# Patient Record
Sex: Male | Born: 1950 | Race: White | Hispanic: No | Marital: Married | Smoking: Former smoker
Health system: Southern US, Community
[De-identification: ages and names within clinical notes are randomized; demographics above are authoritative.]

## PROBLEM LIST (undated history)

## (undated) DIAGNOSIS — R234 Changes in skin texture: Secondary | ICD-10-CM

## (undated) DIAGNOSIS — Z87442 Personal history of urinary calculi: Secondary | ICD-10-CM

## (undated) DIAGNOSIS — H9190 Unspecified hearing loss, unspecified ear: Secondary | ICD-10-CM

## (undated) DIAGNOSIS — N179 Acute kidney failure, unspecified: Secondary | ICD-10-CM

## (undated) DIAGNOSIS — A319 Mycobacterial infection, unspecified: Secondary | ICD-10-CM

## (undated) DIAGNOSIS — Z452 Encounter for adjustment and management of vascular access device: Secondary | ICD-10-CM

## (undated) DIAGNOSIS — Z8719 Personal history of other diseases of the digestive system: Secondary | ICD-10-CM

## (undated) DIAGNOSIS — K219 Gastro-esophageal reflux disease without esophagitis: Secondary | ICD-10-CM

## (undated) DIAGNOSIS — I82409 Acute embolism and thrombosis of unspecified deep veins of unspecified lower extremity: Secondary | ICD-10-CM

## (undated) DIAGNOSIS — K5090 Crohn's disease, unspecified, without complications: Secondary | ICD-10-CM

## (undated) DIAGNOSIS — K859 Acute pancreatitis without necrosis or infection, unspecified: Secondary | ICD-10-CM

## (undated) DIAGNOSIS — M009 Pyogenic arthritis, unspecified: Secondary | ICD-10-CM

## (undated) DIAGNOSIS — J189 Pneumonia, unspecified organism: Secondary | ICD-10-CM

## (undated) DIAGNOSIS — F419 Anxiety disorder, unspecified: Secondary | ICD-10-CM

## (undated) DIAGNOSIS — M86142 Other acute osteomyelitis, left hand: Secondary | ICD-10-CM

## (undated) HISTORY — DX: Mycobacterial infection, unspecified: A31.9

## (undated) HISTORY — PX: ILEOSTOMY: SHX1783

## (undated) HISTORY — PX: CHOLECYSTECTOMY: SHX55

## (undated) HISTORY — PX: COLON SURGERY: SHX602

## (undated) HISTORY — DX: Crohn's disease, unspecified, without complications: K50.90

## (undated) HISTORY — PX: COLONOSCOPY: SHX174

## (undated) HISTORY — PX: APPENDECTOMY: SHX54

---
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
## 2002-07-04 ENCOUNTER — Ambulatory Visit (HOSPITAL_COMMUNITY): Admission: RE | Admit: 2002-07-04 | Discharge: 2002-07-04 | Payer: Self-pay | Admitting: Internal Medicine

## 2004-01-18 ENCOUNTER — Emergency Department (HOSPITAL_COMMUNITY): Admission: EM | Admit: 2004-01-18 | Discharge: 2004-01-18 | Payer: Self-pay | Admitting: Emergency Medicine

## 2004-01-20 ENCOUNTER — Emergency Department (HOSPITAL_COMMUNITY): Admission: EM | Admit: 2004-01-20 | Discharge: 2004-01-20 | Payer: Self-pay | Admitting: Emergency Medicine

## 2004-01-25 ENCOUNTER — Emergency Department (HOSPITAL_COMMUNITY): Admission: EM | Admit: 2004-01-25 | Discharge: 2004-01-25 | Payer: Self-pay | Admitting: Emergency Medicine

## 2004-05-08 ENCOUNTER — Ambulatory Visit: Payer: Self-pay | Admitting: Internal Medicine

## 2004-11-11 ENCOUNTER — Ambulatory Visit: Payer: Self-pay | Admitting: Internal Medicine

## 2005-04-21 ENCOUNTER — Ambulatory Visit: Payer: Self-pay | Admitting: Internal Medicine

## 2005-12-02 ENCOUNTER — Ambulatory Visit: Payer: Self-pay | Admitting: Internal Medicine

## 2006-06-17 ENCOUNTER — Ambulatory Visit: Payer: Self-pay | Admitting: Internal Medicine

## 2006-06-23 ENCOUNTER — Emergency Department (HOSPITAL_COMMUNITY): Admission: EM | Admit: 2006-06-23 | Discharge: 2006-06-23 | Payer: Self-pay | Admitting: Emergency Medicine

## 2006-06-29 ENCOUNTER — Ambulatory Visit (HOSPITAL_COMMUNITY): Admission: RE | Admit: 2006-06-29 | Discharge: 2006-06-30 | Payer: Self-pay | Admitting: Orthopaedic Surgery

## 2006-06-29 HISTORY — PX: ORIF DISTAL RADIUS FRACTURE: SUR927

## 2010-05-29 ENCOUNTER — Ambulatory Visit
Admission: RE | Admit: 2010-05-29 | Discharge: 2010-05-29 | Payer: Self-pay | Source: Home / Self Care | Attending: Internal Medicine | Admitting: Internal Medicine

## 2010-07-02 ENCOUNTER — Encounter (HOSPITAL_BASED_OUTPATIENT_CLINIC_OR_DEPARTMENT_OTHER): Payer: PRIVATE HEALTH INSURANCE | Admitting: Internal Medicine

## 2010-07-02 ENCOUNTER — Ambulatory Visit (HOSPITAL_COMMUNITY)
Admission: RE | Admit: 2010-07-02 | Discharge: 2010-07-02 | Disposition: A | Discharge status: Home / Self Care | Payer: PRIVATE HEALTH INSURANCE | Source: Ambulatory Visit | Attending: Internal Medicine | Admitting: Internal Medicine

## 2010-07-02 DIAGNOSIS — Z8719 Personal history of other diseases of the digestive system: Secondary | ICD-10-CM

## 2010-07-02 DIAGNOSIS — K5080 Crohn's disease of both small and large intestine without complications: Secondary | ICD-10-CM

## 2010-07-02 DIAGNOSIS — R109 Unspecified abdominal pain: Secondary | ICD-10-CM | POA: Insufficient documentation

## 2010-07-02 DIAGNOSIS — R112 Nausea with vomiting, unspecified: Secondary | ICD-10-CM | POA: Insufficient documentation

## 2010-07-02 DIAGNOSIS — K5289 Other specified noninfective gastroenteritis and colitis: Secondary | ICD-10-CM | POA: Insufficient documentation

## 2010-07-02 HISTORY — PX: ILEOSCOPY: SHX311

## 2010-07-15 NOTE — Op Note (Signed)
  NAMEKRISHAWN, Klein                ACCOUNT NO.:  [id number]  MEDICAL RECORD NO.:  [id number]           PATIENT TYPE:  O  LOCATION:  DAYP                          FACILITY:  [facility]  PHYSICIAN:  Lionel December, M.D.    DATE OF BIRTH:  1950-11-14  DATE OF PROCEDURE:  07/02/2010 DATE OF DISCHARGE:                              OPERATIVE REPORT   PROCEDURE:  Ileoscopy.  INDICATIONS:  Abraham is 60 year old Caucasian male with very complicated history of Crohn disease of about 35 years' duration with multiple surgeries.  Lately, he has experienced episodes of abdominal pain with nausea, vomiting, and shutting down of his ileostomy.  He was in emergency room few weeks ago and his symptoms resolved while he was waiting.  He had another episode over the weekend.  He and his wife are concerned that he may have ileal stricture.  He is therefore undergoing this exam.  He did have a small-bowel follow-through few weeks ago and was within normal limits.  The patient's last surgery was about 2 years ago when he had resection of ileocolonic segment with extensive disease. He now has ileostomy.  He is not having any rectal bleeding or drainage. Procedures were reviewed with the patient and informed consent was obtained.  MEDICATIONS FOR CONSCIOUS SEDATION:  Fentanyl 25 mcg IV, Versed 4 mg IV.  FINDINGS:  Procedure performed with the patient in supine position. Mucosa at ileostomy was somewhat erythematous but no erosions or ulcers were noted.  There was a large ventral hernia lateral to the ileostomy. Skin was somewhat indurated below the ileostomy, but no definite mass palpated.  Digital exam revealed no stricture but there was a firmness along the left or medial wall.  This was felt to be extrinsic and possibly scar.  I was able to pass finger distal to it.  Pediatric colonoscope was used.  The scope was placed into ileostomy and advanced for about 30 cm.  In the distal 10 cm or so he had mucosal  edema, erythema, and few erosions.  One erosion was semilunar shaped involving half the circumference; however, no stricture was noted in the segments that were examined.  Mucosa beyond 10 cm was normal.  Endoscope was withdrawn.  No biopsy was taken.  The patient tolerated the procedure well.  FINAL DIAGNOSES: 1. 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 2. Firmness noted on digital exam along the medial wall, felt to be     extrinsic.  RECOMMENDATIONS:  He will continue 6-MP as before and stay on prednisone 5 mg daily.  Should his symptoms recur, he will give me a call; otherwise, return for OV in 3 months.     Lionel December, M.D.     NR/MEDQ  D:  07/02/2010  T:  07/02/2010  Job:  [id number]  cc:   Fara Chute Fax: [phone]  Electronically Signed by Lionel December M.D. on 07/15/2010 10:20:36 AM

## 2010-10-03 NOTE — Op Note (Signed)
Lee Klein, Lee Klein                          ACCOUNT NO.:  [id number]   MEDICAL RECORD NO.:  [id number]                   PATIENT TYPE:  AMB   LOCATION:  DAY                                  FACILITY:  [facility]   PHYSICIAN:  Lionel December, M.D.                 DATE OF BIRTH:  1950/10/04   DATE OF PROCEDURE:  07/04/2002  DATE OF DISCHARGE:                                 OPERATIVE REPORT   PROCEDURE:  Flexible sigmoidoscopy.   INDICATIONS:  The patient is a 60 year old Caucasian male with Crohn's  disease of seven years' duration.  He is steroid-dependent.  He has been  maintained on 6-MP for several months.  He has known ulceration at ileocolic  anastomosis.  He is agreeable to coming off the prednisone if this ulcer has  healed.  Previous attempts have caused relapse of his symptoms.  The  procedure is reviewed with the patient and informed consent was obtained.   PREMEDICATION:  None.   INSTRUMENT USED:  Olympus video system.   FINDINGS:  Procedure performed in endoscopy suite.  The patient's vital  signs and O2 saturation were monitored during the procedure and remained  stable.  The patient was placed in the left lateral recumbent position and  rectal examination performed.  Some discomfort noted, but there was no  stricture.  The scope was placed in the rectum, and he had semiformed stool.  After washing, I was able to find the lumen.  The scope was passed in the  distal sigmoid colon, where anastomosis was identified.  There was mucosal  edema and erosions distal to the anastomosis.  There was circumferential  ulceration evident at the anastomosis.  Lumen was small, 6-7 mm.  I did not  attempt to pass the scope through it.  Pictures taken for the record.  Biopsies were not taken this time, as he has had them before (at [facility]).  The  endoscope was withdrawn.  The patient tolerated the procedure well.   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  Endoscopically the stricture appears to have gotten worse since his last  flexible sigmoidoscopy.   RECOMMENDATIONS:  1. For now will continue his prednisone and Imuran.  2.     I would like for him and his wife, Elita Quick, to do their own search on Remicade.      I am inclined to give him a few doses of Remicade hoping to heal this     inflammatory process.  I feel at some point he will need to have this     stricture balloon-dilated, but I would wait until there is a change in     his symptoms.  Lionel December, M.D.    NR/MEDQ  D:  07/04/2002  T:  07/04/2002  Job:  [id number]   cc:   Fara Chute  [address] [place]  [place] [address]  Fax: [phone]

## 2010-10-03 NOTE — Op Note (Signed)
Lee Klein, Lee Klein                ACCOUNT NO.:  [id number]   MEDICAL RECORD NO.:  [id number]          PATIENT TYPE:  OIB   LOCATION:  5019                         FACILITY:  [facility]   PHYSICIAN:  Vanita Panda. Magnus Ivan, M.D.DATE OF BIRTH:  1951-02-12   DATE OF PROCEDURE:  06/29/2006  DATE OF DISCHARGE:                               OPERATIVE REPORT   PREOPERATIVE DIAGNOSIS:  Right unstable intra-articular four part distal  radius fracture.   POSTOPERATIVE DIAGNOSIS:  Right unstable intra-articular four part  distal radius fracture.   PROCEDURE:  Open reduction and internal fixation of right intra-  articular unstable distal radius fracture.   IMPLANTS:  [employer] distal radial volar locking plate.   SURGEON:  Vanita Panda. Magnus Ivan, M.D.   ANESTHESIA:  General.   ANTIBIOTICS:  1 gram IV Ancef.   COMPLICATIONS:  None.   BLOOD LOSS:  Minimal.   TOURNIQUET TIME:  1 hour and 30 minutes.   INDICATIONS:  Briefly, Lee Klein is a 60 year old right-hand-dominant  male who fell off the back of some type of truck last week sustaining an  impacted distal radius fracture.  This was found to be a four-part  fracture with an intra-articular component.  Due to the unstable nature  of this fracture and this being his dominant extremity, it was  recommended he undergo open reduction and internal fixation with volar  plating to give him the potential of getting his wrist of moving  quicker, as well.  The risks and benefits of this were explained and  well understood.  He agreed to proceed with surgery.   PROCEDURE DESCRIPTION:  After informed consent was obtained and the  appropriate right wrist was marked, he was brought to the operating room  and placed supine on the operating table.  His right arm was placed on a  radiolucent arm table.  General anesthesia was then obtained.  His arm  was prepped and draped with Hibiclens scrub.  A nonsterile tourniquet  was placed on his  upper right arm.  His arm was then prepped and draped  with, again, Hibiclens scrub and sterile drapes.  An Esmarch was used to  wrap out the right wrist and the tourniquet was elevated to 250 mL  pressure. Prior to making an incision, a time-out was called and he was  identified as the correct patient and the correct extremity.  I then  took a volar approach to the wrist sharply with a knife and went between  interval of the flexor carpi radialis and radial artery, both of these  were retracted gently out of the way to expose the pronator quadratus.  The pronator quadratus was then taken down as a sleeve from radial to  ulnar direction exposing the entire volar surface of the distal radius.  There was found to be at least four significant pieces including the  large radial styloid piece and a piece affecting the lunate facet.  I  teased these into two reduced positions under direct fluoroscopy. I then  was able to pick a [employer] distal radial volar locking plate in  the standard width and place this along the volar surface of the distal  radius. With the fractures held reduced, I then placed a bicortical  screw in the proximal slot to tease the fracture in a reduced position  more and it also appeared that I had to use reduction forceps to bring  the styloid piece in.  I then placed a distal row of screws including  two screws in the radial styloid and a proximal row of screws.  All the  distal screws were fully-threaded locking screws and, again, under  direct fluoroscopy, I did see that I the fracture teased into a reduced  position.  I then further secured the plate proximally with two  additional bicortical screws.  After the fracture was reduced and  stabilized with the plate, I put the wrist through a range of motion  under direct fluoroscopy and this showed a full range of motion.  I did  not see any widening between the scaphoid and the lunate and these  appeared to  palmar flex and dorsiflex in a congruent manner.  The wound  was then copiously irrigated.  I reapproximated the pronator quadratus  as best I could over the plate with interrupted 3-0 Vicryl suture.  I  then closed the wound with additional interrupted 3-0 Vicryl suture in  the subcutaneous tissue and interrupted 3-0 Prolene on the skin.  Xeroform followed by a well padded sterile dressing and a volar plaster  splint was then applied.  The tourniquet was let down and the fingers  did pinken nicely.  The patient was awakened, extubated, and taken to  the recovery room in stable condition.  All final counts were correct.  There were no complications noted.           ______________________________  Vanita Panda. Magnus Ivan, M.D.     CYB/MEDQ  D:  06/29/2006  T:  06/29/2006  Job:  [id number]

## 2010-11-17 ENCOUNTER — Ambulatory Visit (INDEPENDENT_AMBULATORY_CARE_PROVIDER_SITE_OTHER): Payer: PRIVATE HEALTH INSURANCE | Admitting: Internal Medicine

## 2010-11-17 DIAGNOSIS — K91858 Other complications of intestinal pouch: Secondary | ICD-10-CM

## 2010-11-17 DIAGNOSIS — K5090 Crohn's disease, unspecified, without complications: Secondary | ICD-10-CM

## 2011-02-14 ENCOUNTER — Other Ambulatory Visit (INDEPENDENT_AMBULATORY_CARE_PROVIDER_SITE_OTHER): Payer: Self-pay | Admitting: Internal Medicine

## 2011-03-23 ENCOUNTER — Other Ambulatory Visit (INDEPENDENT_AMBULATORY_CARE_PROVIDER_SITE_OTHER): Payer: Self-pay | Admitting: Internal Medicine

## 2011-05-14 ENCOUNTER — Other Ambulatory Visit (INDEPENDENT_AMBULATORY_CARE_PROVIDER_SITE_OTHER): Payer: Self-pay | Admitting: Internal Medicine

## 2011-06-01 ENCOUNTER — Encounter (INDEPENDENT_AMBULATORY_CARE_PROVIDER_SITE_OTHER): Payer: Self-pay | Admitting: Internal Medicine

## 2011-06-01 ENCOUNTER — Ambulatory Visit (INDEPENDENT_AMBULATORY_CARE_PROVIDER_SITE_OTHER): Payer: PRIVATE HEALTH INSURANCE | Admitting: Internal Medicine

## 2011-06-01 VITALS — BP 102/70 | HR 74 | Temp 97.9°F | Resp 16 | Ht 72.0 in | Wt 168.4 lb

## 2011-06-01 DIAGNOSIS — K5090 Crohn's disease, unspecified, without complications: Secondary | ICD-10-CM | POA: Insufficient documentation

## 2011-06-01 DIAGNOSIS — F439 Reaction to severe stress, unspecified: Secondary | ICD-10-CM

## 2011-06-01 DIAGNOSIS — Z733 Stress, not elsewhere classified: Secondary | ICD-10-CM

## 2011-06-01 MED ORDER — ALPRAZOLAM 0.5 MG PO TABS: 0.5000 mg | ORAL_TABLET | Freq: Every evening | ORAL | Status: DC | PRN

## 2011-06-01 MED ORDER — ALPRAZOLAM 0.5 MG PO TABS: 0.5000 mg | ORAL_TABLET | ORAL | Status: DC

## 2011-06-01 MED ORDER — AZATHIOPRINE 50 MG PO TABS: 50.0000 mg | ORAL_TABLET | Freq: Three times a day (TID) | ORAL | Status: DC

## 2011-06-01 NOTE — Progress Notes (Signed)
Presenting complaint; Followup for ileal Crohn's disease. Subjective: Lee Klein is 61 year old Caucasian male who is here for scheduled visit. 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 He was evaluated at [facility] and they recommended against reconnecting him. He had flexible sigmoidoscopy in Fabry 2012 and he had active disease involving distal 10 cm of ileum. He continues to have high output via ileostomy. He states loperamide has helped. When it gets really bad he increased his prednisone dose from 5-10 mg for a few days and it seemed to make a difference. He denies melena or bleeding into colostomy. Occasionally he may have urged have a BM but nothing comes out to his rectum. He denies abdominal pain. He has good appetite. He is maintaining his weight. He states he has to get up at least once at night to empty his bag. Several weeks ago he was given an antibiotic by his primary care physician and it made his diarrhea worse; now he is back to its baseline Current Medications: Current Outpatient Prescriptions  Medication Sig Dispense Refill  . ALPRAZolam (XANAX) 0.5 MG tablet Take 1 tablet (0.5 mg total) by mouth at bedtime as needed for sleep.  90 tablet  1  . Ascorbic Acid (VITAMIN C) 1000 MG tablet Take 1,000 mg by mouth daily.      Marland Kitchen azaTHIOprine (IMURAN) 50 MG tablet Take 1 tablet (50 mg total) by mouth 3 (three) times daily.  270 tablet  3  . B Complex-C (SUPER B COMPLEX PO) Take by mouth daily.      . Calcium Carbonate-Vitamin D (CALCIUM 600 + D PO) Take by mouth 2 (two) times daily.      Marland Kitchen HYDROcodone-acetaminophen (VICODIN) 5-500 MG per tablet Take 1 tablet by mouth Nightly.      . loperamide (ANTI-DIARRHEAL) 2 MG capsule Take 2 mg by mouth. Patient takes 4 in the morning and 4 at night      . Multiple Vitamins-Minerals (MULTIVITAMIN PO) Take by mouth daily.       . predniSONE (DELTASONE) 5 MG tablet take as directed  100 tablet  0  . Zinc 50 MG CAPS Take by mouth daily.        Objective: Blood pressure 102/70, pulse 74, temperature 97.9 F (36.6 C), temperature source Oral, resp. rate 16, height 6' (1.829 m), weight 168 lb 6.4 oz (76.386 kg).  Conjunctiva is pink. Sclera is nonicteric Oral pharyngeal mucosa is normal. No neck masses or thyromegaly noted. Cardiac exam with regular rhythm normal S1 and S2. No murmur or gallop noted. Lungs are clear to auscultation. Abdomen; he has some yellowish-brown pasty stool in the ileostomy bag. No tenderness noted at ileostomy site. The rest of the abdomen is soft and nontender. He does not have organomegaly or masses.  No LE edema or clubbing noted.  Assessment: Ileal Crohn's disease; his major problem now appears to be high output diarrhea via ileostomy. He is on maximal dose of loperamide. He does not appear to be dehydrated and is maintaining his weight. Need to check his electrolytes and he is also long overdue for CBC given that he is on azathioprine. His quality of life appears to be great.   Plan: New prescription given for azathioprine and alprazolam. 3 month supply with 3 refills. He will continue low-dose prednisone as before He will go to the lab for CBC comprehensive chemistry panel and  CRP. We will contact patient with results of blood work. Office visit in 6 months.

## 2011-06-01 NOTE — Patient Instructions (Signed)
Physician will call you with result of blood work when drawn and completed.

## 2011-07-02 ENCOUNTER — Telehealth (INDEPENDENT_AMBULATORY_CARE_PROVIDER_SITE_OTHER): Payer: Self-pay | Admitting: *Deleted

## 2011-07-02 DIAGNOSIS — K5010 Crohn's disease of large intestine without complications: Secondary | ICD-10-CM

## 2011-07-02 NOTE — Telephone Encounter (Signed)
Per NUR the patient will need a CBC/D in 4 months the patient will get this in or at [facility].

## 2011-07-26 ENCOUNTER — Other Ambulatory Visit (INDEPENDENT_AMBULATORY_CARE_PROVIDER_SITE_OTHER): Payer: Self-pay | Admitting: Internal Medicine

## 2011-09-02 ENCOUNTER — Other Ambulatory Visit (INDEPENDENT_AMBULATORY_CARE_PROVIDER_SITE_OTHER): Payer: Self-pay | Admitting: Internal Medicine

## 2011-09-09 ENCOUNTER — Other Ambulatory Visit (INDEPENDENT_AMBULATORY_CARE_PROVIDER_SITE_OTHER): Payer: Self-pay | Admitting: Internal Medicine

## 2011-09-11 ENCOUNTER — Other Ambulatory Visit (INDEPENDENT_AMBULATORY_CARE_PROVIDER_SITE_OTHER): Payer: Self-pay | Admitting: Internal Medicine

## 2011-09-11 NOTE — Telephone Encounter (Signed)
Rx has been written for this

## 2011-10-16 ENCOUNTER — Other Ambulatory Visit (INDEPENDENT_AMBULATORY_CARE_PROVIDER_SITE_OTHER): Payer: Self-pay | Admitting: *Deleted

## 2011-10-16 ENCOUNTER — Encounter (INDEPENDENT_AMBULATORY_CARE_PROVIDER_SITE_OTHER): Payer: Self-pay | Admitting: *Deleted

## 2011-10-16 DIAGNOSIS — K5010 Crohn's disease of large intestine without complications: Secondary | ICD-10-CM

## 2011-10-29 ENCOUNTER — Telehealth (INDEPENDENT_AMBULATORY_CARE_PROVIDER_SITE_OTHER): Payer: Self-pay | Admitting: *Deleted

## 2011-10-29 DIAGNOSIS — K5090 Crohn's disease, unspecified, without complications: Secondary | ICD-10-CM

## 2011-10-29 NOTE — Telephone Encounter (Signed)
Patient was called at his home number, message left that lab results (CBC/D) were normal.  The next lab is due in 3 months. Patient also reminder that he has a office appointment December 15 2011 at 3:30 pm.

## 2011-11-06 ENCOUNTER — Encounter (INDEPENDENT_AMBULATORY_CARE_PROVIDER_SITE_OTHER): Payer: Self-pay

## 2011-11-07 ENCOUNTER — Other Ambulatory Visit (INDEPENDENT_AMBULATORY_CARE_PROVIDER_SITE_OTHER): Payer: Self-pay | Admitting: Internal Medicine

## 2011-12-15 ENCOUNTER — Encounter (INDEPENDENT_AMBULATORY_CARE_PROVIDER_SITE_OTHER): Payer: Self-pay | Admitting: Internal Medicine

## 2011-12-15 ENCOUNTER — Ambulatory Visit (INDEPENDENT_AMBULATORY_CARE_PROVIDER_SITE_OTHER): Payer: PRIVATE HEALTH INSURANCE | Admitting: Internal Medicine

## 2011-12-15 VITALS — BP 118/70 | HR 76 | Temp 97.4°F | Resp 20 | Ht 72.0 in | Wt 168.9 lb

## 2011-12-15 DIAGNOSIS — R197 Diarrhea, unspecified: Secondary | ICD-10-CM | POA: Insufficient documentation

## 2011-12-15 DIAGNOSIS — F430 Acute stress reaction: Secondary | ICD-10-CM

## 2011-12-15 DIAGNOSIS — F439 Reaction to severe stress, unspecified: Secondary | ICD-10-CM

## 2011-12-15 DIAGNOSIS — K9413 Enterostomy malfunction: Secondary | ICD-10-CM | POA: Insufficient documentation

## 2011-12-15 DIAGNOSIS — K9403 Colostomy malfunction: Secondary | ICD-10-CM

## 2011-12-15 DIAGNOSIS — R109 Unspecified abdominal pain: Secondary | ICD-10-CM

## 2011-12-15 MED ORDER — CHOLESTYRAMINE 4 G PO PACK: 1.0000 | PACK | Freq: Two times a day (BID) | ORAL | Status: DC

## 2011-12-15 MED ORDER — DIPHENOXYLATE-ATROPINE 2.5-0.025 MG PO TABS: 2.0000 | ORAL_TABLET | Freq: Four times a day (QID) | ORAL | Status: AC

## 2011-12-15 MED ORDER — HYDROCODONE-ACETAMINOPHEN 5-500 MG PO TABS: 1.0000 | ORAL_TABLET | Freq: Two times a day (BID) | ORAL | Status: DC | PRN

## 2011-12-15 MED ORDER — ALPRAZOLAM 0.5 MG PO TABS: 0.5000 mg | ORAL_TABLET | Freq: Every evening | ORAL | Status: DC | PRN

## 2011-12-15 NOTE — Progress Notes (Signed)
Presenting complaint;  Followup for Crohn's disease. Having problems with the ileostomy.  Subjective:  Lee Klein is 61 year old Caucasian male who is here for scheduled visit accompanied by his wife Elita Quick. He was last seen in January 2013. He is complaining of excruciating burning at inferior aspect of his ileostomy. He feels as if he has an open wound and somebody is putting alcohol over it. He was seen by physician at [facility] and was advised to use silver nitrate he was not given prescription. Patient is afraid to use this medication. He remains with diarrhea. Volume depends on what he eats. He is emptying his back 7-8 times a day and generally able to rest at night. He has not passed any blood or tarry stool into ileostomy. He is not having rectal discharge or bleeding. His appetite is up and down but he has not lost any weight since his last visit. He denies nausea vomiting fever or chills.  Current Medications: Current Outpatient Prescriptions  Medication Sig Dispense Refill  . ALPRAZolam (XANAX) 0.5 MG tablet Take 1 tablet (0.5 mg total) by mouth at bedtime as needed for sleep.  90 tablet  1  . Ascorbic Acid (VITAMIN C) 1000 MG tablet Take 1,000 mg by mouth daily.      Marland Kitchen azaTHIOprine (IMURAN) 50 MG tablet Take 1 tablet (50 mg total) by mouth 3 (three) times daily.  270 tablet  3  . B Complex-C (SUPER B COMPLEX PO) Take by mouth daily.      . Calcium Carbonate-Vitamin D (CALCIUM 600 + D PO) Take by mouth 2 (two) times daily.      Marland Kitchen HYDROcodone-acetaminophen (VICODIN) 5-500 MG per tablet take 1 tablet by mouth once daily if needed for pain  60 tablet  1  . loperamide (ANTI-DIARRHEAL) 2 MG capsule Take 2 mg by mouth. Patient takes 4 in the morning and 4 at night      . Multiple Vitamins-Minerals (MULTIVITAMIN PO) Take by mouth daily.      . predniSONE (DELTASONE) 5 MG tablet TAKE AS DIRECTED  100 tablet  1  . Zinc 50 MG CAPS Take by mouth daily.         Objective: Blood pressure  118/70, pulse 76, temperature 97.4 F (36.3 C), temperature source Oral, resp. rate 20, height 6' (1.829 m), weight 168 lb 14.4 oz (76.613 kg). Patient appears quite disgusted but in no acute distress. Conjunctiva is pink. Sclera is nonicteric Oropharyngeal mucosa is normal. No neck masses or thyromegaly noted. Cardiac exam with regular rhythm normal S1 and S2. No murmur or gallop noted. Lungs are clear to auscultation. Abdomen. An ostomy bag as greenish yellow liquid stool. Abdomen is soft and nontender. Examination of ileostomy sites reveals pale glassy appearance to a stomal skin from 3 to 9 o'clock and it is very tender. Mucosa at ileostomy site appears to be normal. No LE edema or clubbing noted.  Labs/studies Results: Lab data from 10/26/2011. WBC 7.2. H&H 13.3 and 39.5. Platelet count 165K.   Assessment:  #1. Peri-stomal pain at ileostomy site not responding to topical steroid therapy. I do not see any granulation tissue I am not sure that silver nitrate application would help. Ostomy nurse has worked with him in the past they have tried all sorts of fixed but nothing has worked. At this point would be reasonable to get an opinion from Dr. Cleotis Nipper who did his last surgery and performed our resection with ileostomy. #2. Chronic diarrhea. He has tried octreotide  in the past but it did not work. Will try him on Lomotil. #3. Insomnia. Alprazolam is working.   Plan:  Discontinue Imodium. Lomotil 1 tablet by mouth 4 times a day or 2 twice a day. Cholestyramine 4 g by mouth twice a day as directed. Will arrange for office visit with Dr. Elmyra Ricks of shot at [facility]. Progress report in one month regarding diarrhea. New prescription for alprazolam and hydrocodone/acetaminophen refilled. Office visit in 6 months. Next CBC will be due in September 2013.

## 2011-12-15 NOTE — Patient Instructions (Addendum)
Your next CBC will be in 3 months. Office visit with Dr. Cleotis Nipper to be arranged. Discontinue Imodium. Lomotil 1 tablet by mouth 4 times a day or 2 tablets before breakfast and before lunch. Take cholestyramine 2 hours before or after taking other medication. Call with progress report in one month re diarrhea

## 2011-12-16 ENCOUNTER — Telehealth (INDEPENDENT_AMBULATORY_CARE_PROVIDER_SITE_OTHER): Payer: Self-pay | Admitting: *Deleted

## 2011-12-16 DIAGNOSIS — K5010 Crohn's disease of large intestine without complications: Secondary | ICD-10-CM

## 2011-12-16 DIAGNOSIS — R197 Diarrhea, unspecified: Secondary | ICD-10-CM

## 2011-12-16 NOTE — Telephone Encounter (Signed)
Per Dr.Rehman at the time of the patient's office visit 12/15/11. The patient will need to get have lab work in September. This is noted for 02-15-12 at [name]

## 2011-12-17 ENCOUNTER — Telehealth (INDEPENDENT_AMBULATORY_CARE_PROVIDER_SITE_OTHER): Payer: Self-pay | Admitting: *Deleted

## 2011-12-17 NOTE — Telephone Encounter (Signed)
Lee Klein has started taking Lomotil , 2 four times a day. Today he took his medication and he is very dizzy, and has a headache. He ask if he should continue this medication? I told him Dr.Rehman would be made aware , that he may adjust this medication or he may have him stop the medication. Dr. Karilyn Cota was paged @ 12:02 pm , and also this was sent to him. [name] may be reached at [phone]

## 2011-12-17 NOTE — Telephone Encounter (Signed)
Please call [phone], some of his meds are making him not feel good

## 2011-12-17 NOTE — Telephone Encounter (Signed)
Already addressed

## 2011-12-17 NOTE — Telephone Encounter (Signed)
Per Dr.Rehman the patient may stop the medication and go back to the Imodium. Patient was called and made aware.

## 2011-12-18 ENCOUNTER — Telehealth (INDEPENDENT_AMBULATORY_CARE_PROVIDER_SITE_OTHER): Payer: Self-pay | Admitting: *Deleted

## 2011-12-18 NOTE — Telephone Encounter (Signed)
Dr.Rehman - Jasn called and states that he was taking the Lomotil as it was written on the medicine bottle. It said for him to take 2 (two) po  4 (four) times a day , so he was taking 8. He says that it is working and ask if he could take 2 by mouth twice a day? He may be reached at [phone]

## 2011-12-19 NOTE — Telephone Encounter (Signed)
Patient called and message left on his answering service. He can continue Lomotil if it is working and he has no side effects. 2 before breakfast and 2 before lunch. Will add third dose if necessary at a later date

## 2012-01-14 ENCOUNTER — Other Ambulatory Visit (INDEPENDENT_AMBULATORY_CARE_PROVIDER_SITE_OTHER): Payer: Self-pay | Admitting: *Deleted

## 2012-01-14 ENCOUNTER — Encounter (INDEPENDENT_AMBULATORY_CARE_PROVIDER_SITE_OTHER): Payer: Self-pay | Admitting: *Deleted

## 2012-01-14 DIAGNOSIS — K5090 Crohn's disease, unspecified, without complications: Secondary | ICD-10-CM

## 2012-01-14 DIAGNOSIS — R197 Diarrhea, unspecified: Secondary | ICD-10-CM

## 2012-01-14 DIAGNOSIS — K5010 Crohn's disease of large intestine without complications: Secondary | ICD-10-CM

## 2012-02-19 ENCOUNTER — Telehealth (INDEPENDENT_AMBULATORY_CARE_PROVIDER_SITE_OTHER): Payer: Self-pay | Admitting: *Deleted

## 2012-02-19 DIAGNOSIS — K5090 Crohn's disease, unspecified, without complications: Secondary | ICD-10-CM

## 2012-02-19 NOTE — Telephone Encounter (Signed)
Per Dr.rehman after reviewing patient's lab work 01/21/2012. The patient will need another CBC 3 months. This is noted for January 29,2014. The patient will prefer [facility] lab

## 2012-04-04 ENCOUNTER — Other Ambulatory Visit (INDEPENDENT_AMBULATORY_CARE_PROVIDER_SITE_OTHER): Payer: Self-pay | Admitting: Internal Medicine

## 2012-04-05 NOTE — Telephone Encounter (Signed)
Rx for refilled with 1 refill

## 2012-04-28 ENCOUNTER — Encounter (INDEPENDENT_AMBULATORY_CARE_PROVIDER_SITE_OTHER): Payer: Self-pay | Admitting: *Deleted

## 2012-04-28 ENCOUNTER — Telehealth (INDEPENDENT_AMBULATORY_CARE_PROVIDER_SITE_OTHER): Payer: Self-pay | Admitting: *Deleted

## 2012-04-28 DIAGNOSIS — K5090 Crohn's disease, unspecified, without complications: Secondary | ICD-10-CM

## 2012-04-28 NOTE — Telephone Encounter (Signed)
Lab order done 

## 2012-05-01 ENCOUNTER — Other Ambulatory Visit (INDEPENDENT_AMBULATORY_CARE_PROVIDER_SITE_OTHER): Payer: Self-pay | Admitting: Internal Medicine

## 2012-05-13 ENCOUNTER — Encounter (INDEPENDENT_AMBULATORY_CARE_PROVIDER_SITE_OTHER): Payer: Self-pay

## 2012-05-13 ENCOUNTER — Encounter (INDEPENDENT_AMBULATORY_CARE_PROVIDER_SITE_OTHER): Payer: Self-pay | Admitting: *Deleted

## 2012-06-08 ENCOUNTER — Telehealth (INDEPENDENT_AMBULATORY_CARE_PROVIDER_SITE_OTHER): Payer: Self-pay | Admitting: *Deleted

## 2012-06-08 DIAGNOSIS — K5090 Crohn's disease, unspecified, without complications: Secondary | ICD-10-CM

## 2012-06-08 NOTE — Telephone Encounter (Signed)
Per Dr.Rehman the patient will need to have CBC in 3 months, this has been noted . Patient request that the order be sent to [facility]

## 2012-06-16 ENCOUNTER — Encounter (INDEPENDENT_AMBULATORY_CARE_PROVIDER_SITE_OTHER): Payer: Self-pay | Admitting: Internal Medicine

## 2012-06-16 ENCOUNTER — Ambulatory Visit (INDEPENDENT_AMBULATORY_CARE_PROVIDER_SITE_OTHER): Payer: Medicare HMO | Admitting: Internal Medicine

## 2012-06-16 VITALS — BP 122/68 | HR 80 | Temp 98.1°F | Ht 72.0 in | Wt 170.8 lb

## 2012-06-16 DIAGNOSIS — F439 Reaction to severe stress, unspecified: Secondary | ICD-10-CM

## 2012-06-16 DIAGNOSIS — Z733 Stress, not elsewhere classified: Secondary | ICD-10-CM

## 2012-06-16 DIAGNOSIS — K5090 Crohn's disease, unspecified, without complications: Secondary | ICD-10-CM

## 2012-06-16 MED ORDER — HYDROCODONE-ACETAMINOPHEN 5-500 MG PO TABS: 1.0000 | ORAL_TABLET | Freq: Three times a day (TID) | ORAL | Status: DC | PRN

## 2012-06-16 MED ORDER — PREDNISONE 5 MG PO TABS: 5.0000 mg | ORAL_TABLET | Freq: Every day | ORAL | Status: DC

## 2012-06-16 MED ORDER — HYDROCODONE-ACETAMINOPHEN 5-500 MG PO TABS: 1.0000 | ORAL_TABLET | ORAL | Status: DC | PRN

## 2012-06-16 MED ORDER — AZATHIOPRINE 50 MG PO TABS: 50.0000 mg | ORAL_TABLET | Freq: Three times a day (TID) | ORAL | Status: DC

## 2012-06-16 MED ORDER — ALPRAZOLAM 0.5 MG PO TABS: 0.5000 mg | ORAL_TABLET | Freq: Every evening | ORAL | Status: DC | PRN

## 2012-06-16 NOTE — Progress Notes (Signed)
Subjective:     Patient ID: Lee Klein, male   DOB: Oct 14, 1950, 62 y.o.   MRN: [id number]  HPIHere today for f/u.  He saw Dr. Cleotis Nipper August  off2013 for irritation to his stoma. He saw an ostomy nurse and was treated with Stoma care and the irritation resolved within 3 days. He tells me he is doing good. Appetite is good. No weight loss.  He has actually gained 2 pounds. He was diagnosed with Crohn's in 1977. He has required multiple surgeries. 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  He underwent a flex. Sig. In February of 2012 and he had active disease involving the distal 10 cm of the ileum. He is emptying his ostomy bag around 6 times a day.  He is taking Imodium 4 BID. He feels good for the most part.   06/02/2012 H and H 14.3 and 40.6, MCV 97.8, Platelet ct 164, WBC 7.1  Review of Systems  see hpi Current Outpatient Prescriptions  Medication Sig Dispense Refill  . ALPRAZolam (XANAX) 0.5 MG tablet Take 1 tablet (0.5 mg total) by mouth at bedtime as needed for sleep.  90 tablet  1  . Ascorbic Acid (VITAMIN C) 1000 MG tablet Take 1,000 mg by mouth daily.      Marland Kitchen azaTHIOprine (IMURAN) 50 MG tablet Take 1 tablet (50 mg total) by mouth 3 (three) times daily.  270 tablet  3  . B Complex-C (SUPER B COMPLEX PO) Take by mouth daily.      . Calcium Carbonate-Vitamin D (CALCIUM 600 + D PO) Take by mouth 2 (two) times daily.      Marland Kitchen HYDROcodone-acetaminophen (VICODIN) 5-500 MG per tablet take 1 tablet by mouth twice a day for pain  60 tablet  1  . loperamide (IMODIUM) 2 MG capsule Take 2 mg by mouth 4 (four) times daily as needed.      . Multiple Vitamins-Minerals (MULTIVITAMIN PO) Take by mouth daily.      . predniSONE (DELTASONE) 5 MG tablet TAKE AS DIRECTED  100 tablet  0  . Zinc 50 MG CAPS Take by mouth daily.      . cholestyramine (QUESTRAN) 4 G packet Take 1 packet by mouth 2 (two) times daily with a meal.  60 each  12    Past Medical History  Diagnosis Date  . Crohn's disease    Past Surgical History  Procedure Date  . Ileostomy   . Colonoscopy   . Upper gastrointestinal endoscopy   . Appendectomy   . Colon surgery        Objective:   Physical Exam  Filed Vitals:   06/16/12 1408  BP: 122/68  Pulse: 80  Temp: 98.1 F (36.7 C)  Height: 6' (1.829 m)  Weight: 170 lb 12.8 oz (77.474 kg)   Alert and oriented. Skin warm and dry. Oral mucosa is moist.   . Sclera anicteric, conjunctivae is pink. Thyroid not enlarged. No cervical lymphadenopathy. Lungs clear. Heart regular rate and rhythm.  Abdomen is soft. Bowel sounds are positive. No hepatomegaly. No abdominal masses felt. No tenderness.  No edema to lower extremities.      Assessment:     Crohn's disease. He is doing well. CBC was WNL.     Plan:   OV in 6 months. CBC in 3 months. Refills on Prednisone, Hydrocodone, Xanax, and given to patient or eprescribed. Patient is changing drug stores due to  his insurance.   Form for parking for disabled individual filled out for patient. He has a hard time walking.

## 2012-06-16 NOTE — Patient Instructions (Addendum)
OV in 6 months. CBC in 3 months.  

## 2012-08-02 ENCOUNTER — Telehealth (INDEPENDENT_AMBULATORY_CARE_PROVIDER_SITE_OTHER): Payer: Self-pay | Admitting: *Deleted

## 2012-08-02 NOTE — Telephone Encounter (Signed)
Need to know what kind of supplies he needs. He needs to bring this information in. Nothing in the computer for this

## 2012-08-02 NOTE — Telephone Encounter (Signed)
Imraan was give a rx by Dorene Ar NP on 06/15/12 for Lee Klein. [facility] will not accept a rx from the patient. He would like for Terri to please fax a new rx for Ostomy Supplies to [facility] at [phone]. His return phone number is [phone].

## 2012-08-11 ENCOUNTER — Encounter (INDEPENDENT_AMBULATORY_CARE_PROVIDER_SITE_OTHER): Payer: Self-pay | Admitting: *Deleted

## 2012-08-11 ENCOUNTER — Other Ambulatory Visit (INDEPENDENT_AMBULATORY_CARE_PROVIDER_SITE_OTHER): Payer: Self-pay | Admitting: *Deleted

## 2012-08-11 DIAGNOSIS — K5090 Crohn's disease, unspecified, without complications: Secondary | ICD-10-CM

## 2012-08-16 ENCOUNTER — Telehealth (INDEPENDENT_AMBULATORY_CARE_PROVIDER_SITE_OTHER): Payer: Self-pay | Admitting: *Deleted

## 2012-08-16 NOTE — Telephone Encounter (Signed)
[facility] in [place] told Lee Klein they didn't make the dosage written on his rx for Vicodin/hydrocodine anymore. Needs a new rx for whatever they make. Please return his call to [phone].

## 2012-08-16 NOTE — Telephone Encounter (Signed)
I have already talked with the pharmacy. You can call patient back

## 2012-08-17 NOTE — Telephone Encounter (Signed)
Patient advised and voices understood.  

## 2012-09-09 ENCOUNTER — Telehealth (INDEPENDENT_AMBULATORY_CARE_PROVIDER_SITE_OTHER): Payer: Self-pay | Admitting: *Deleted

## 2012-09-09 DIAGNOSIS — K5090 Crohn's disease, unspecified, without complications: Secondary | ICD-10-CM

## 2012-09-09 NOTE — Telephone Encounter (Signed)
Per Dr.Rehman the patient will need to have labs drawn. 

## 2012-09-14 ENCOUNTER — Other Ambulatory Visit (INDEPENDENT_AMBULATORY_CARE_PROVIDER_SITE_OTHER): Payer: Self-pay | Admitting: Internal Medicine

## 2012-09-15 ENCOUNTER — Other Ambulatory Visit (INDEPENDENT_AMBULATORY_CARE_PROVIDER_SITE_OTHER): Payer: Self-pay | Admitting: Internal Medicine

## 2012-09-15 DIAGNOSIS — K5090 Crohn's disease, unspecified, without complications: Secondary | ICD-10-CM

## 2012-09-16 ENCOUNTER — Other Ambulatory Visit (INDEPENDENT_AMBULATORY_CARE_PROVIDER_SITE_OTHER): Payer: Self-pay | Admitting: Internal Medicine

## 2012-09-16 MED ORDER — HYDROCODONE-ACETAMINOPHEN 5-300 MG PO TABS: 1.0000 | ORAL_TABLET | Freq: Two times a day (BID) | ORAL | Status: DC | PRN

## 2012-12-13 ENCOUNTER — Ambulatory Visit (INDEPENDENT_AMBULATORY_CARE_PROVIDER_SITE_OTHER): Payer: Medicare HMO | Admitting: Internal Medicine

## 2012-12-15 ENCOUNTER — Other Ambulatory Visit (INDEPENDENT_AMBULATORY_CARE_PROVIDER_SITE_OTHER): Payer: Self-pay | Admitting: *Deleted

## 2012-12-15 ENCOUNTER — Encounter (INDEPENDENT_AMBULATORY_CARE_PROVIDER_SITE_OTHER): Payer: Self-pay | Admitting: Internal Medicine

## 2012-12-15 ENCOUNTER — Encounter (INDEPENDENT_AMBULATORY_CARE_PROVIDER_SITE_OTHER): Payer: Self-pay | Admitting: *Deleted

## 2012-12-15 ENCOUNTER — Ambulatory Visit (INDEPENDENT_AMBULATORY_CARE_PROVIDER_SITE_OTHER): Payer: Medicare HMO | Admitting: Internal Medicine

## 2012-12-15 ENCOUNTER — Other Ambulatory Visit (INDEPENDENT_AMBULATORY_CARE_PROVIDER_SITE_OTHER): Payer: Self-pay | Admitting: Internal Medicine

## 2012-12-15 VITALS — BP 112/72 | HR 66 | Temp 98.2°F | Ht 72.0 in | Wt 171.2 lb

## 2012-12-15 DIAGNOSIS — K50919 Crohn's disease, unspecified, with unspecified complications: Secondary | ICD-10-CM

## 2012-12-15 DIAGNOSIS — K5090 Crohn's disease, unspecified, without complications: Secondary | ICD-10-CM

## 2012-12-15 DIAGNOSIS — F439 Reaction to severe stress, unspecified: Secondary | ICD-10-CM

## 2012-12-15 MED ORDER — ALPRAZOLAM 0.5 MG PO TABS: 0.5000 mg | ORAL_TABLET | Freq: Every evening | ORAL | Status: DC | PRN

## 2012-12-15 MED ORDER — PREDNISONE 5 MG PO TABS: ORAL_TABLET | ORAL | Status: DC

## 2012-12-15 MED ORDER — HYDROCODONE-ACETAMINOPHEN 5-300 MG PO TABS: 1.0000 | ORAL_TABLET | Freq: Two times a day (BID) | ORAL | Status: DC | PRN

## 2012-12-15 NOTE — Progress Notes (Addendum)
Subjective:     Patient ID: Lee Klein, male   DOB: [birth year], 62 y.o.   MRN: [id number]  HPI He tells me he is doing good. He tells me his colostomy stopped up x 2 in the past two weeks but it finally started to drain.  No melena or bright red rectal bleeding. He is emptying his bag about 5 times a day. He was diagnosed with Crohn's in 1977. He has required multiple surgeries. 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  He underwent a flex. Sig. In February of 2012 and he had active disease involving the distal 10 cm of the ileum.  He is emptying his ostomy bag around 6 times a day. He is taking Imodium 4 BID.  He feels good for the most part.  4/914 H and H 14.6 and 43.4, MCV 97.2, WBC 7.5, Platelet ct 142.  Review of Systems see hpi Current Outpatient Prescriptions  Medication Sig Dispense Refill  . ALPRAZolam (XANAX) 0.5 MG tablet Take 1 tablet (0.5 mg total) by mouth at bedtime as needed for sleep.  90 tablet  3  . Ascorbic Acid (VITAMIN C) 1000 MG tablet Take 1,000 mg by mouth daily.      Marland Kitchen azaTHIOprine (IMURAN) 50 MG tablet Take 1 tablet (50 mg total) by mouth 3 (three) times daily.  270 tablet  3  . B Complex-C (SUPER B COMPLEX PO) Take by mouth daily.      . Calcium Carbonate-Vitamin D (CALCIUM 600 + D PO) Take by mouth 2 (two) times daily.      . Hydrocodone-Acetaminophen 5-300 MG TABS Take 1 tablet by mouth 2 (two) times daily as needed. 1/2 tab at hs      . loperamide (IMODIUM) 2 MG capsule Take 2 mg by mouth 4 (four) times daily as needed.      . Multiple Vitamins-Minerals (MULTIVITAMIN PO) Take by mouth daily.      . predniSONE (DELTASONE) 5 MG tablet TAKE 1 TABLET BY MOUTH DAILY  90 tablet  1  . Zinc 50 MG CAPS Take by mouth daily.      . cholestyramine (QUESTRAN) 4 G packet Take 1 packet by mouth 2 (two) times daily with a meal.  60 each  12   No current facility-administered medications for this visit.    Past Medical History  Diagnosis Date  . Crohn's disease    Past Surgical History  Procedure Laterality Date  . Ileostomy    . Colonoscopy    . Upper gastrointestinal endoscopy    . Appendectomy    . Colon surgery     Allergies  Allergen Reactions  . Ivp Dye (Iodinated Diagnostic Agents)     Joint pain  . Demerol Rash        Objective:   Physical Exam  Filed Vitals:   12/15/12 1608  BP: 112/72  Pulse: 66  Temp: 98.2 F (36.8 C)  Height: 6' (1.829 m)  Weight: 171 lb 3.2 oz (77.656 kg)   Alert and oriented. Skin warm and dry. Oral mucosa is moist.   . Sclera anicteric, conjunctivae is pink. Thyroid not enlarged. No cervical lymphadenopathy. Lungs clear. Heart regular rate and rhythm.  Abdomen is soft. Bowel sounds are positive. No hepatomegaly. No abdominal masses felt. No tenderness.  No edema to lower extremities.  No redness to ostomy site. Draining brown stool.      Assessment:   Crohn's disease  which seems to be in remission. No GI problems at this time.  Maintained on Imuran.     Plan:    CBC,CRP. Today.  OV in 6 months. Refills on Hydrocodone, Xanax, and Prednisone

## 2012-12-15 NOTE — Patient Instructions (Addendum)
OV in 6 months. CBC, CRP

## 2013-01-18 ENCOUNTER — Telehealth (INDEPENDENT_AMBULATORY_CARE_PROVIDER_SITE_OTHER): Payer: Self-pay | Admitting: *Deleted

## 2013-01-18 DIAGNOSIS — K5090 Crohn's disease, unspecified, without complications: Secondary | ICD-10-CM

## 2013-01-18 NOTE — Telephone Encounter (Signed)
Per Dr.Rehman the patient will need to have labs drawn in 4 months. 

## 2013-01-23 ENCOUNTER — Encounter: Payer: Self-pay | Admitting: Cardiovascular Disease

## 2013-01-23 DIAGNOSIS — R079 Chest pain, unspecified: Secondary | ICD-10-CM

## 2013-04-15 ENCOUNTER — Telehealth (INDEPENDENT_AMBULATORY_CARE_PROVIDER_SITE_OTHER): Payer: Self-pay | Admitting: Internal Medicine

## 2013-04-15 MED ORDER — HYOSCYAMINE SULFATE 0.125 MG SL SUBL: 0.1250 mg | SUBLINGUAL_TABLET | SUBLINGUAL | Status: DC | PRN

## 2013-04-15 MED ORDER — PROMETHAZINE HCL 25 MG PO TABS: 25.0000 mg | ORAL_TABLET | Freq: Two times a day (BID) | ORAL | Status: DC | PRN

## 2013-04-15 NOTE — Telephone Encounter (Signed)
[name] called her husband Maston stating that he is nauseated and has been having abdominal cramps loose watery stools. He is afebrile. He is not passing blood per rectum and does not have distended abdomen. Prescription for hyoscyamine sublingual and promethazine called to his pharmacy. He will stay on clear liquids. If he develops vomiting or abdominal distention he would need to go to emergency room.

## 2013-04-27 ENCOUNTER — Encounter (INDEPENDENT_AMBULATORY_CARE_PROVIDER_SITE_OTHER): Payer: Self-pay | Admitting: *Deleted

## 2013-04-27 ENCOUNTER — Other Ambulatory Visit (INDEPENDENT_AMBULATORY_CARE_PROVIDER_SITE_OTHER): Payer: Self-pay | Admitting: *Deleted

## 2013-04-27 DIAGNOSIS — K5090 Crohn's disease, unspecified, without complications: Secondary | ICD-10-CM

## 2013-06-01 ENCOUNTER — Telehealth (INDEPENDENT_AMBULATORY_CARE_PROVIDER_SITE_OTHER): Payer: Self-pay | Admitting: *Deleted

## 2013-06-01 DIAGNOSIS — K5090 Crohn's disease, unspecified, without complications: Secondary | ICD-10-CM

## 2013-06-01 NOTE — Telephone Encounter (Signed)
Per Dr.Rehman the patient will need to have labs drawn in 4 months. Patient will have drawn at [facility].

## 2013-06-04 ENCOUNTER — Other Ambulatory Visit (INDEPENDENT_AMBULATORY_CARE_PROVIDER_SITE_OTHER): Payer: Self-pay | Admitting: Internal Medicine

## 2013-06-05 ENCOUNTER — Encounter (INDEPENDENT_AMBULATORY_CARE_PROVIDER_SITE_OTHER): Payer: Self-pay | Admitting: *Deleted

## 2013-06-21 ENCOUNTER — Telehealth (INDEPENDENT_AMBULATORY_CARE_PROVIDER_SITE_OTHER): Payer: Self-pay | Admitting: Internal Medicine

## 2013-06-21 ENCOUNTER — Ambulatory Visit (INDEPENDENT_AMBULATORY_CARE_PROVIDER_SITE_OTHER): Payer: Medicare HMO | Admitting: Internal Medicine

## 2013-06-21 ENCOUNTER — Encounter (INDEPENDENT_AMBULATORY_CARE_PROVIDER_SITE_OTHER): Payer: Self-pay | Admitting: Internal Medicine

## 2013-06-21 VITALS — BP 120/60 | HR 72 | Temp 98.0°F | Ht 72.0 in | Wt 179.6 lb

## 2013-06-21 DIAGNOSIS — K5090 Crohn's disease, unspecified, without complications: Secondary | ICD-10-CM

## 2013-06-21 MED ORDER — HYDROCODONE-ACETAMINOPHEN 5-325 MG PO TABS: 1.0000 | ORAL_TABLET | Freq: Four times a day (QID) | ORAL | Status: DC | PRN

## 2013-06-21 NOTE — Progress Notes (Signed)
Subjective:     Patient ID: Lee Klein, male   DOB: 05-24-1950, 63 y.o.   MRN: [id number]  HPI Here today for f/u of his Crohn's disease. He was last seen in July of last year. He states he is doing. He is emptying his colostomy 4-6 times a day.  No melena or bright red rectal bleeding.   He was diagnosed with Crohn's in 1977. He has required multiple surgeries. 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  He underwent a flex. Sig. In February of 2012 and he had active disease involving the distal 10 cm of the ileum.    He is taking Imodium 4 tabs BID. Appetite is good. No weight loss.   05/19/2012 WBC 8.4, RBC 4.38, H and H 15.0 and 42.5, MCV 97.2, Platelet ct 182. (Dr. Karilyn Cotaehman reviewed with patient earlier)        Review of Systems see hpi Past Medical History  Diagnosis Date  . Crohn's disease    Past Surgical History  Procedure Laterality Date  . Ileostomy    . Colonoscopy    . Upper gastrointestinal endoscopy    . Appendectomy    . Colon surgery     Allergies  Allergen Reactions  . Ivp Dye [Iodinated Diagnostic Agents]     Joint pain  . Demerol Rash        Objective:   Physical Exam  Filed Vitals:   06/21/13 1544  BP: 120/60  Pulse: 72  Temp: 98 F (36.7 C)  Height: 6' (1.829 m)  Weight: 179 lb 9.6 oz (81.466 kg)  Alert and oriented. Skin warm and dry. Oral mucosa is moist.   . Sclera anicteric, conjunctivae is pink. Thyroid not enlarged. No cervical lymphadenopathy. Lungs clear. Heart regular rate and rhythm.  Abdomen is soft. Bowel sounds are positive. No hepatomegaly. No abdominal masses felt. No tenderness.  No edema to lower extremities.       Assessment:    Crohn's disease. He seems to be doing well.     Plan:    OV in 6 months. Continue present medications.    Rx for Hydrocodone 5/325mg  # 90 with no refills given to patient.

## 2013-06-21 NOTE — Patient Instructions (Signed)
OV in 6 months with a CBC 

## 2013-06-23 ENCOUNTER — Telehealth (INDEPENDENT_AMBULATORY_CARE_PROVIDER_SITE_OTHER): Payer: Self-pay | Admitting: *Deleted

## 2013-06-23 ENCOUNTER — Other Ambulatory Visit (INDEPENDENT_AMBULATORY_CARE_PROVIDER_SITE_OTHER): Payer: Self-pay | Admitting: Internal Medicine

## 2013-06-23 DIAGNOSIS — K5090 Crohn's disease, unspecified, without complications: Secondary | ICD-10-CM

## 2013-06-23 NOTE — Telephone Encounter (Signed)
.  Per Delrae Rend patient will need to have labs in 6 months.

## 2013-06-26 NOTE — Telephone Encounter (Signed)
Opened in error

## 2013-07-27 ENCOUNTER — Telehealth (INDEPENDENT_AMBULATORY_CARE_PROVIDER_SITE_OTHER): Payer: Self-pay | Admitting: *Deleted

## 2013-07-27 NOTE — Telephone Encounter (Signed)
Lee Klein seen Dr. Bunnie Pion at [facility] on 07/26/13. He has Bursitis in his hip. Dr. Leavy Cella told him to increase his Prednisone to 10 mg daily until his hip has cleared up. Lee Klein would like to know if this is okay with Dr. Karilyn Cota. He did take the 10 mg yesterday. He also had x-rays done and everything looked good.

## 2013-07-27 NOTE — Telephone Encounter (Signed)
Okay to increase prednisone dose as recommended by Dr. Leavy CellaBoyd. Please call patient

## 2013-07-27 NOTE — Telephone Encounter (Signed)
Forwarded to Dr.Rehman for review. 

## 2013-07-27 NOTE — Telephone Encounter (Signed)
Patient called and made aware of Dr.Rehman's recommendation.

## 2013-08-16 ENCOUNTER — Encounter (INDEPENDENT_AMBULATORY_CARE_PROVIDER_SITE_OTHER): Payer: Self-pay | Admitting: *Deleted

## 2013-08-16 ENCOUNTER — Other Ambulatory Visit (INDEPENDENT_AMBULATORY_CARE_PROVIDER_SITE_OTHER): Payer: Self-pay | Admitting: *Deleted

## 2013-08-16 MED ORDER — PREDNISONE 5 MG PO TABS: ORAL_TABLET | ORAL | Status: DC

## 2013-08-16 NOTE — Telephone Encounter (Signed)
Patient is requesting a 90 day supply

## 2013-08-16 NOTE — Telephone Encounter (Signed)
This encounter was created in error - please disregard.

## 2013-08-16 NOTE — Telephone Encounter (Signed)
Lee Klein is needing his preidsone refilled. Please refer

## 2013-08-17 ENCOUNTER — Other Ambulatory Visit (INDEPENDENT_AMBULATORY_CARE_PROVIDER_SITE_OTHER): Payer: Self-pay | Admitting: Internal Medicine

## 2013-08-17 DIAGNOSIS — K5090 Crohn's disease, unspecified, without complications: Secondary | ICD-10-CM

## 2013-08-17 MED ORDER — PREDNISONE 5 MG PO TABS: ORAL_TABLET | ORAL | Status: DC

## 2013-09-02 ENCOUNTER — Other Ambulatory Visit (INDEPENDENT_AMBULATORY_CARE_PROVIDER_SITE_OTHER): Payer: Self-pay | Admitting: Internal Medicine

## 2013-09-13 ENCOUNTER — Encounter (INDEPENDENT_AMBULATORY_CARE_PROVIDER_SITE_OTHER): Payer: Self-pay | Admitting: *Deleted

## 2013-09-13 ENCOUNTER — Other Ambulatory Visit (INDEPENDENT_AMBULATORY_CARE_PROVIDER_SITE_OTHER): Payer: Self-pay | Admitting: *Deleted

## 2013-09-13 DIAGNOSIS — K5090 Crohn's disease, unspecified, without complications: Secondary | ICD-10-CM

## 2013-09-15 ENCOUNTER — Inpatient Hospital Stay (HOSPITAL_COMMUNITY)
Admission: EM | Admit: 2013-09-15 | Discharge: 2013-09-18 | DRG: 394 | Disposition: A | Discharge status: Home / Self Care | Payer: Managed Care, Other (non HMO) | Attending: Family Medicine | Admitting: Family Medicine

## 2013-09-15 ENCOUNTER — Encounter (HOSPITAL_COMMUNITY): Payer: Self-pay | Admitting: Emergency Medicine

## 2013-09-15 DIAGNOSIS — K5090 Crohn's disease, unspecified, without complications: Secondary | ICD-10-CM

## 2013-09-15 DIAGNOSIS — K5080 Crohn's disease of both small and large intestine without complications: Secondary | ICD-10-CM | POA: Diagnosis present

## 2013-09-15 DIAGNOSIS — IMO0002 Reserved for concepts with insufficient information to code with codable children: Principal | ICD-10-CM | POA: Diagnosis present

## 2013-09-15 DIAGNOSIS — K56609 Unspecified intestinal obstruction, unspecified as to partial versus complete obstruction: Secondary | ICD-10-CM

## 2013-09-15 DIAGNOSIS — Z932 Ileostomy status: Secondary | ICD-10-CM

## 2013-09-15 DIAGNOSIS — Z87891 Personal history of nicotine dependence: Secondary | ICD-10-CM

## 2013-09-15 DIAGNOSIS — K5669 Other intestinal obstruction: Secondary | ICD-10-CM | POA: Diagnosis present

## 2013-09-15 DIAGNOSIS — J029 Acute pharyngitis, unspecified: Secondary | ICD-10-CM | POA: Diagnosis present

## 2013-09-15 NOTE — ED Notes (Signed)
I have an ileostomy and it is stopped up. I have tried putting an enema in it and also have ran my finger into it and it has not done anything for the past 4 hours. It was working right before that, draining fine per pt.

## 2013-09-16 ENCOUNTER — Encounter (HOSPITAL_COMMUNITY): Payer: Self-pay | Admitting: *Deleted

## 2013-09-16 ENCOUNTER — Emergency Department (HOSPITAL_COMMUNITY): Payer: Managed Care, Other (non HMO)

## 2013-09-16 DIAGNOSIS — K5090 Crohn's disease, unspecified, without complications: Secondary | ICD-10-CM

## 2013-09-16 DIAGNOSIS — K5669 Other intestinal obstruction: Secondary | ICD-10-CM

## 2013-09-16 DIAGNOSIS — Z932 Ileostomy status: Secondary | ICD-10-CM

## 2013-09-16 DIAGNOSIS — K56609 Unspecified intestinal obstruction, unspecified as to partial versus complete obstruction: Secondary | ICD-10-CM | POA: Diagnosis present

## 2013-09-16 DIAGNOSIS — K5080 Crohn's disease of both small and large intestine without complications: Secondary | ICD-10-CM

## 2013-09-16 DIAGNOSIS — K6389 Other specified diseases of intestine: Secondary | ICD-10-CM

## 2013-09-16 LAB — CBC WITH DIFFERENTIAL/PLATELET
BASOS ABS: 0 10*3/uL (ref 0.0–0.1)
BASOS PCT: 0 % (ref 0–1)
Eosinophils Absolute: 0.1 10*3/uL (ref 0.0–0.7)
Eosinophils Relative: 1 % (ref 0–5)
HCT: 45.3 % (ref 39.0–52.0)
Hemoglobin: 16.1 g/dL (ref 13.0–17.0)
Lymphocytes Relative: 11 % — ABNORMAL LOW (ref 12–46)
Lymphs Abs: 1.4 10*3/uL (ref 0.7–4.0)
MCH: 34.3 pg — ABNORMAL HIGH (ref 26.0–34.0)
MCHC: 35.5 g/dL (ref 30.0–36.0)
MCV: 96.6 fL (ref 78.0–100.0)
MONOS PCT: 7 % (ref 3–12)
Monocytes Absolute: 1 10*3/uL (ref 0.1–1.0)
Neutro Abs: 11 10*3/uL — ABNORMAL HIGH (ref 1.7–7.7)
Neutrophils Relative %: 81 % — ABNORMAL HIGH (ref 43–77)
Platelets: 191 10*3/uL (ref 150–400)
RBC: 4.69 MIL/uL (ref 4.22–5.81)
RDW: 13.5 % (ref 11.5–15.5)
WBC: 13.5 10*3/uL — ABNORMAL HIGH (ref 4.0–10.5)

## 2013-09-16 LAB — COMPREHENSIVE METABOLIC PANEL
ALK PHOS: 91 U/L (ref 39–117)
ALT: 19 U/L (ref 0–53)
AST: 23 U/L (ref 0–37)
Albumin: 4.2 g/dL (ref 3.5–5.2)
BILIRUBIN TOTAL: 0.7 mg/dL (ref 0.3–1.2)
BUN: 10 mg/dL (ref 6–23)
CO2: 24 mEq/L (ref 19–32)
Calcium: 9.8 mg/dL (ref 8.4–10.5)
Chloride: 104 mEq/L (ref 96–112)
Creatinine, Ser: 0.96 mg/dL (ref 0.50–1.35)
GFR calc Af Amer: >90 mL/min (ref >90)
GFR calc non Af Amer: 88 mL/min — ABNORMAL LOW (ref >90)
Glucose, Bld: 107 mg/dL — ABNORMAL HIGH (ref 70–99)
POTASSIUM: 3.7 meq/L (ref 3.7–5.3)
Sodium: 142 mEq/L (ref 137–147)
Total Protein: 7.2 g/dL (ref 6.0–8.3)

## 2013-09-16 LAB — URINALYSIS, ROUTINE W REFLEX MICROSCOPIC
BILIRUBIN URINE: NEGATIVE
GLUCOSE, UA: NEGATIVE mg/dL
HGB URINE DIPSTICK: NEGATIVE
KETONES UR: NEGATIVE mg/dL
Leukocytes, UA: NEGATIVE
Nitrite: NEGATIVE
PH: 5.5 (ref 5.0–8.0)
Protein, ur: 30 mg/dL — AB
Specific Gravity, Urine: >1.03 — ABNORMAL HIGH (ref 1.005–1.030)
Urobilinogen, UA: 0.2 mg/dL (ref 0.0–1.0)

## 2013-09-16 LAB — URINE MICROSCOPIC-ADD ON

## 2013-09-16 LAB — LIPASE, BLOOD: Lipase: 20 U/L (ref 11–59)

## 2013-09-16 MED ORDER — ENOXAPARIN SODIUM 40 MG/0.4ML ~~LOC~~ SOLN
40.0000 mg | SUBCUTANEOUS | Status: DC
  Administered 2013-09-16 – 2013-09-18 (×2): 40 mg via SUBCUTANEOUS
  Filled 2013-09-16 (×3): qty 0.4

## 2013-09-16 MED ORDER — KCL IN DEXTROSE-NACL 20-5-0.9 MEQ/L-%-% IV SOLN
INTRAVENOUS | Status: DC
  Administered 2013-09-16 – 2013-09-17 (×2): via INTRAVENOUS

## 2013-09-16 MED ORDER — DEXTROSE-NACL 5-0.45 % IV SOLN
INTRAVENOUS | Status: AC
  Administered 2013-09-16: 04:00:00 via INTRAVENOUS

## 2013-09-16 MED ORDER — TRAMADOL HCL 50 MG PO TABS: 50.0000 mg | ORAL_TABLET | Freq: Four times a day (QID) | ORAL | Status: DC | PRN

## 2013-09-16 MED ORDER — ACETAMINOPHEN 10 MG/ML IV SOLN
INTRAVENOUS | Status: AC
  Filled 2013-09-16: qty 200

## 2013-09-16 MED ORDER — ONDANSETRON HCL 4 MG/2ML IJ SOLN
4.0000 mg | Freq: Once | INTRAMUSCULAR | Status: AC
  Administered 2013-09-16: 4 mg via INTRAVENOUS
  Filled 2013-09-16: qty 2

## 2013-09-16 MED ORDER — METHYLPREDNISOLONE SODIUM SUCC 125 MG IJ SOLR
125.0000 mg | Freq: Once | INTRAMUSCULAR | Status: AC
  Administered 2013-09-16: 125 mg via INTRAVENOUS
  Filled 2013-09-16: qty 2

## 2013-09-16 MED ORDER — HYDROMORPHONE HCL PF 1 MG/ML IJ SOLN
1.0000 mg | Freq: Once | INTRAMUSCULAR | Status: AC
  Administered 2013-09-16: 1 mg via INTRAVENOUS

## 2013-09-16 MED ORDER — HYDROMORPHONE HCL PF 1 MG/ML IJ SOLN
1.0000 mg | Freq: Once | INTRAMUSCULAR | Status: AC
  Administered 2013-09-16: 1 mg via INTRAVENOUS
  Filled 2013-09-16: qty 1

## 2013-09-16 MED ORDER — LIDOCAINE HCL 2 % EX GEL
CUTANEOUS | Status: AC
  Filled 2013-09-16: qty 10

## 2013-09-16 MED ORDER — HYDROMORPHONE HCL PF 1 MG/ML IJ SOLN
1.0000 mg | INTRAMUSCULAR | Status: AC | PRN
  Administered 2013-09-16: 1 mg via INTRAVENOUS
  Filled 2013-09-16: qty 1

## 2013-09-16 MED ORDER — SODIUM CHLORIDE 0.9 % IV SOLN
INTRAVENOUS | Status: DC
  Administered 2013-09-16: 17:00:00 via INTRAVENOUS

## 2013-09-16 MED ORDER — ONDANSETRON HCL 4 MG/2ML IJ SOLN
4.0000 mg | Freq: Three times a day (TID) | INTRAMUSCULAR | Status: AC | PRN
  Administered 2013-09-16: 4 mg via INTRAVENOUS
  Filled 2013-09-16: qty 2

## 2013-09-16 MED ORDER — HYDROMORPHONE HCL PF 1 MG/ML IJ SOLN
INTRAMUSCULAR | Status: AC
  Administered 2013-09-16: 1 mg via INTRAVENOUS
  Filled 2013-09-16: qty 1

## 2013-09-16 MED ORDER — SODIUM CHLORIDE 0.9 % IV BOLUS (SEPSIS)
1000.0000 mL | Freq: Once | INTRAVENOUS | Status: AC
  Administered 2013-09-16: 1000 mL via INTRAVENOUS

## 2013-09-16 MED ORDER — ACETAMINOPHEN 10 MG/ML IV SOLN
1000.0000 mg | Freq: Four times a day (QID) | INTRAVENOUS | Status: DC
  Administered 2013-09-16 – 2013-09-17 (×2): 1000 mg via INTRAVENOUS
  Filled 2013-09-16 (×4): qty 100

## 2013-09-16 MED ORDER — HYDROMORPHONE HCL PF 1 MG/ML IJ SOLN
1.0000 mg | INTRAMUSCULAR | Status: AC | PRN
  Administered 2013-09-16 (×2): 1 mg via INTRAVENOUS
  Filled 2013-09-16: qty 1

## 2013-09-16 MED ORDER — FLEET ENEMA 7-19 GM/118ML RE ENEM: 1.0000 | ENEMA | Freq: Every day | RECTAL | Status: DC | PRN

## 2013-09-16 MED ORDER — ONDANSETRON HCL 4 MG/2ML IJ SOLN: 4.0000 mg | Freq: Three times a day (TID) | INTRAMUSCULAR | Status: AC | PRN

## 2013-09-16 MED ORDER — ENOXAPARIN SODIUM 40 MG/0.4ML ~~LOC~~ SOLN: 40.0000 mg | SUBCUTANEOUS | Status: DC

## 2013-09-16 MED ORDER — METHYLPREDNISOLONE SODIUM SUCC 40 MG IJ SOLR
20.0000 mg | Freq: Every day | INTRAMUSCULAR | Status: DC
  Administered 2013-09-16: 20 mg via INTRAVENOUS
  Filled 2013-09-16: qty 1

## 2013-09-16 NOTE — Progress Notes (Addendum)
Surgery  Filed Vitals:   09/16/13 0323  BP: 129/79  Pulse: 92  Temp: 97.9 F (36.6 C)  Resp: 18   Pt. Seen and examined and chart reviewed.  In essence this 63 yr old W. Male with a history of 4 surgical interventions for Chron's disease was admitted after he experienced flare up shortly after eating yesterday.  Developed crampy abdominal pain and was seen in ER last night and treated with steroids and pt feels somewhat better but is still uncomfortable.  Abdomen is soft and there is some liquid stool in ileostomy.  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  He has no tenderness around stoma at present and he is passing a small amt. of flatus.  NG tube is also not in place and has been repositioned    and there is bilious output present.  I have discussed this case with Med service and they will coordinate care with Dr. Karilyn Cota.    This pt has been seen surgically in [place] and that is where he wishes to be cared for. This is a complicated IBD pt and I would not operate on him here.   Will check abdominal film in AM and follow with you as needed. Would consider observation in [place] if feasible.

## 2013-09-16 NOTE — Progress Notes (Signed)
Pt emptied 150 mL from Ileostomy this evening which contained a large amount of whole corn kernels.  Educated pt about diet and what foods to avoid.

## 2013-09-16 NOTE — ED Provider Notes (Signed)
CSN: [id number]     Arrival date & time 09/15/13  2341 History   First MD Initiated Contact with Patient 09/16/13 0005     Chief Complaint  Patient presents with  . Abdominal Pain     (Consider location/radiation/quality/duration/timing/severity/associated sxs/prior Treatment) HPI 63 year old male with history of Crohn's disease and multiple prior abdominal surgeries on chronic prednisone has ileostomy with last surgery in 2010 usually ileostomy output almost constant with bag emptied several times daily the patient now presents with 10-12 hours of constant waxing and waning severe abdominal pain mostly to the right lower quadrant where he has a known parastomal hernia with no ileostomy output for at least several hours and at least 6 or more episodes of nonbloody vomiting  With no treatment prior to arrival no chest pain no cough no shortness of breath; he tried an enema in his ileostomy prior to arrival which did not help the symptoms. Past Medical History  Diagnosis Date  . Crohn's disease   . Hernia, abdominal    Past Surgical History  Procedure Laterality Date  . Ileostomy    . Colonoscopy    . Upper gastrointestinal endoscopy    . Appendectomy    . Colon surgery     Family History  Problem Relation Age of Onset  . Healthy Mother   . Healthy Daughter   . Healthy Son    History  Substance Use Topics  . Smoking status: Former Smoker    Types: Cigars    Quit date: 05/31/2006  . Smokeless tobacco: Former Neurosurgeon  . Alcohol Use: No    Review of Systems 10 Systems reviewed and are negative for acute change except as noted in the HPI.   Allergies  Ivp dye and Demerol  Home Medications   Prior to Admission medications   Medication Sig Start Date End Date Taking? Authorizing Provider  ALPRAZolam (XANAX) 0.5 MG tablet TAKE 1 TABLET AT BEDTIME AS NEEDED FOR SLEEP   Yes Malissa Hippo, MD  Ascorbic Acid (VITAMIN C) 1000 MG tablet Take 1,000 mg by mouth daily.   Yes  Historical Provider, MD  azaTHIOprine (IMURAN) 50 MG tablet TAKE 1 TABLET BY MOUTH 3 TIMES DAILY. 06/04/13  Yes Malissa Hippo, MD  B Complex-C (SUPER B COMPLEX PO) Take by mouth daily.   Yes Historical Provider, MD  Calcium Carbonate-Vitamin D (CALCIUM 600 + D PO) Take by mouth 2 (two) times daily.   Yes Historical Provider, MD  cholestyramine Lanetta Inch) 4 G packet Take 1 packet by mouth 2 (two) times daily with a meal. 12/15/11 09/15/13 Yes Malissa Hippo, MD  HYDROcodone-acetaminophen (NORCO/VICODIN) 5-325 MG per tablet Take 1 tablet by mouth every 6 (six) hours as needed for moderate pain. 06/21/13  Yes Len Blalock, NP  loperamide (IMODIUM) 2 MG capsule Take 2 mg by mouth 4 (four) times daily as needed.   Yes Historical Provider, MD  Multiple Vitamins-Minerals (MULTIVITAMIN PO) Take by mouth daily.   Yes Historical Provider, MD  predniSONE (DELTASONE) 5 MG tablet TAKE 1 TABLET BY MOUTH DAILY 08/17/13  Yes Len Blalock, NP  promethazine (PHENERGAN) 25 MG tablet Take 1 tablet (25 mg total) by mouth 2 (two) times daily as needed for nausea or vomiting. 04/15/13  Yes Malissa Hippo, MD  Zinc 50 MG CAPS Take by mouth daily.   Yes Historical Provider, MD  hyoscyamine (LEVSIN SL) 0.125 MG SL tablet Place 1 tablet (0.125 mg total) under the tongue every 4 (four)  hours as needed. 04/15/13   Malissa Hippo, MD   BP 123/78  Pulse 78  Temp(Src) 98 F (36.7 C) (Oral)  Resp 18  Ht 6' (1.829 m)  Wt 172 lb 6.4 oz (78.2 kg)  BMI 23.38 kg/m2  SpO2 94% Physical Exam  Nursing note and vitals reviewed. Constitutional:  Awake, alert, nontoxic but uncomfortable appearing.  HENT:  Head: Atraumatic.  Eyes: Right eye exhibits no discharge. Left eye exhibits no discharge.  Neck: Neck supple.  Cardiovascular: Normal rate and regular rhythm.   No murmur heard. Pulmonary/Chest: Effort normal and breath sounds normal. No respiratory distress. He has no wheezes. He has no rales. He exhibits no tenderness.   Abdominal: Soft. Bowel sounds are normal. He exhibits mass. He exhibits no distension. There is tenderness. There is guarding. There is no rebound.  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  Baseline ROM, no obvious new focal weakness.  Neurological: He is alert.  Mental status and motor strength appears baseline for patient and situation.  Skin: No rash noted.  Psychiatric: He has a normal mood and affect.    ED Course  Procedures (including critical care time)  Patient / Family / Caregiver understand and agree with initial ED impression and plan with expectations set for ED visit.d/w Rehman GI agrees with noncontrast CT, recs NG tube (ordered).  78- patient feels much better with no nausea and no pain and his peristomal hernia is now nontender and soft with the rest of his abdomen soft and nontender as well and his ileostomy has slight liquid output and a couple kernels of corn passed in ED. Jenkins paged for admit.  0200- [facility] called back covering for Lovell Sheehan for next 6 days; stated doesn't operate on or take care of Pts with Crohns/IBD recs d/w Rehman; d/w Rehman recs [facility] and [facility] can consult GI in AM. [facility] paged.  Patient / Family / Caregiver informed of clinical course, understand medical decision-making process, and agree with plan. Labs Review Labs Reviewed  CBC WITH DIFFERENTIAL - Abnormal; Notable for the following:    WBC 13.5 (*)    MCH 34.3 (*)    Neutrophils Relative % 81 (*)    Neutro Abs 11.0 (*)    Lymphocytes Relative 11 (*)    All other components within normal limits  COMPREHENSIVE METABOLIC PANEL - Abnormal; Notable for the following:    Glucose, Bld 107 (*)    GFR calc non Af Amer 88 (*)    All other components within normal limits  URINALYSIS, ROUTINE W  REFLEX MICROSCOPIC - Abnormal; Notable for the following:    Specific Gravity, Urine >1.030 (*)    Protein, ur 30 (*)    All other components within normal limits  URINE MICROSCOPIC-ADD ON - Abnormal; Notable for the following:    Bacteria, UA FEW (*)    Crystals CA OXALATE CRYSTALS (*)    All other components within normal limits  BASIC METABOLIC PANEL - Abnormal; Notable for the following:    Glucose, Bld 116 (*)    All other components within normal limits  CBC - Abnormal; Notable for the following:    WBC 13.4 (*)    MCH 34.3 (*)    All other components within normal limits  LIPASE, BLOOD    Imaging Review No results found.  EKG Interpretation None      MDM   Final diagnoses:  SBO (small bowel obstruction)  Crohn's disease    The patient appears reasonably stabilized for admission considering the current resources, flow, and capabilities available in the ED at this time, and I doubt any other [facility] requiring further screening and/or treatment in the ED prior to admission.    Hurman HornJohn M Coleson Kant, MD 09/20/13 [phone]

## 2013-09-16 NOTE — Progress Notes (Signed)
  PROGRESS NOTE  Lee Klein VHQ:[id number] DOB: [birth year] DOA: 09/15/2013 PCP: Estanislado PandySASSER,PAUL W, MD  Summary: 63 year old man with history of Crohn's disease,  Assessment/Plan: 1. 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 Clinically somewhat improved with decreased pain, nontender, some ostomy output. 2. History of Crohn's disease and parastomal hernia. [facility] and has declined revision of surgery in the past.   Clinically improved with some ostomy output and decreased pain. Hemodynamically stable. Nontoxic in appearance. Case discussed with Dr. Malvin JohnsBradford who has examined the patient's ostomy and reports no need for surgical intervention at this point. Recommends transfer to tertiary care center if operative intervention needed. He recommends GI evaluation which is pending.  Will followup with GI recommendations.  Consider transfer to tertiary care center if fails to improve further.  Code Status: full code DVT prophylaxis: Lovenox Family Communication: none present Disposition Plan: pending  Brendia Sacksaniel Goodrich, MD  [facility]  Pager [phone] If 7PM-7AM, please contact night-coverage at [email or web address], password [email or web address] 09/16/2013, 8:40 AM  LOS: 1 day   Consultants:  General surgery  Gastroenterology  Procedures:    Antibiotics:    HPI/Subjective: Feels better today. Some output from ostomy. Less distended, less tender. No pain around the ostomy site. No vomiting since NG tube placed.  Objective: Filed Vitals:   09/15/13 2353 09/16/13 0111 09/16/13 0130 09/16/13 0323  BP: 113/57 122/73 123/77 129/79  Pulse: 90 89 88 92  Temp: 97.7 F (36.5 C) 98.3 F (36.8 C)  97.9 F (36.6 C)  TempSrc: Oral Oral  Oral  Resp: 20 20  18   Height: 6' (1.829 m)   6' (1.829 m)  Weight: 77.111 kg (170 lb)   78.2 kg (172 lb 6.4 oz)  SpO2: 100% 94% 91% 91%    Intake/Output Summary (Last 24  hours) at 09/16/13 0840 Last data filed at 09/16/13 0500  Gross per 24 hour  Intake    105 ml  Output      0 ml  Net    105 ml     Filed Weights   09/15/13 2353 09/16/13 0323  Weight: 77.111 kg (170 lb) 78.2 kg (172 lb 6.4 oz)    Exam:   Afebrile, vital signs are stable. No hypoxia.  Gen. Appears calm and comfortable. Speech fluent and clear. Nontoxic.  Cardiovascular. Regular rate and rhythm. No murmur, rub or gallop. No lower extremity edema.  Respiratory. Clear to auscultation bilaterally. No wheezes, rales or rhonchi. Normal respiratory effort.  Abdomen soft, distended but nontender. Decreased bowel sounds. No guarding. Approximately 100 mL of ostomy output in the bag, dark brown.  Data Reviewed:  Complete metabolic panel on admission unremarkable. CBC 13.5.  CT of the abdomen and pelvis revealed small bowel obstruction, parastomal hernia adjacent to the right lower part her ileostomy.  Scheduled Meds: . dextrose 5 % and 0.45% NaCl   Intravenous STAT  . enoxaparin (LOVENOX) injection  40 mg Subcutaneous Q24H  . lidocaine      . methylPREDNISolone (SOLU-MEDROL) injection  20 mg Intravenous Daily   Continuous Infusions:   Active Problems:   SBO (small bowel obstruction)   Time spent 20 minutes

## 2013-09-16 NOTE — Progress Notes (Signed)
Patient reports passing scant amount of gas into ileostomy bag. He denies abdominal pain. He was given IV acetaminophen for headache. Digital exam of ileostomy reveals no abnormality or stricture. Will continue with current therapy and discussed with Dr. Derrell Lolling who is on for central [place] surgery. IV fluids rate increased to 125 mL per hour. Will add KCL 20 mEq per liter.

## 2013-09-16 NOTE — H&P (Signed)
PCP:   Manon Hilding, MD   Chief Complaint:  Abdominal pain  HPI:  63 year old male who  has a past medical history of Crohn's disease and Hernia, abdominal. today came to the ED with abdominal pain, nausea vomiting and ileostomy not working. Patient has had prior abdominal surgeries and has ileostomy for last 5 years. As per patient ileostomy output is almost constant but stopped working yesterday, he also tried enema which did not help so he came to the ED. 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 In the ED NG tube was inserted, he denies nausea vomiting at this time. Continues to have abdominal pain, which actually improved with Dilaudid. CT scan abdomen shows small bowel obstruction  Allergies:   Allergies  Allergen Reactions  . Ivp Dye [Iodinated Diagnostic Agents]     Joint pain  . Demerol Rash      Past Medical History  Diagnosis Date  . Crohn's disease   . Hernia, abdominal     Past Surgical History  Procedure Laterality Date  . Ileostomy    . Colonoscopy    . Upper gastrointestinal endoscopy    . Appendectomy    . Colon surgery      Prior to Admission medications   Medication Sig Start Date End Date Taking? Authorizing Provider  ALPRAZolam (XANAX) 0.5 MG tablet TAKE 1 TABLET AT BEDTIME AS NEEDED FOR SLEEP   Yes Rogene Houston, MD  Ascorbic Acid (VITAMIN C) 1000 MG tablet Take 1,000 mg by mouth daily.   Yes Historical Provider, MD  azaTHIOprine (IMURAN) 50 MG tablet TAKE 1 TABLET BY MOUTH 3 TIMES DAILY. 06/04/13  Yes Rogene Houston, MD  B Complex-C (SUPER B COMPLEX PO) Take by mouth daily.   Yes Historical Provider, MD  Calcium Carbonate-Vitamin D (CALCIUM 600 + D PO) Take by mouth 2 (two) times daily.   Yes Historical Provider, MD  cholestyramine Lucrezia Starch) 4 G packet Take 1 packet by mouth 2 (two) times daily with a meal. 12/15/11 09/15/13 Yes Rogene Houston, MD  HYDROcodone-acetaminophen  (NORCO/VICODIN) 5-325 MG per tablet Take 1 tablet by mouth every 6 (six) hours as needed for moderate pain. 06/21/13  Yes Butch Penny, NP  loperamide (IMODIUM) 2 MG capsule Take 2 mg by mouth 4 (four) times daily as needed.   Yes Historical Provider, MD  Multiple Vitamins-Minerals (MULTIVITAMIN PO) Take by mouth daily.   Yes Historical Provider, MD  predniSONE (DELTASONE) 5 MG tablet TAKE 1 TABLET BY MOUTH DAILY 08/17/13  Yes Butch Penny, NP  promethazine (PHENERGAN) 25 MG tablet Take 1 tablet (25 mg total) by mouth 2 (two) times daily as needed for nausea or vomiting. 04/15/13  Yes Rogene Houston, MD  Zinc 50 MG CAPS Take by mouth daily.   Yes Historical Provider, MD  hyoscyamine (LEVSIN SL) 0.125 MG SL tablet Place 1 tablet (0.125 mg total) under the tongue every 4 (four) hours as needed. 04/15/13   Rogene Houston, MD    Social History:  reports that he quit smoking about 7 years ago. His smoking use included Cigars. He has quit using smokeless tobacco. He reports that he does not drink alcohol or use illicit drugs.  Family History  Problem Relation Age of Onset  . Healthy Mother   . Healthy Daughter   . Healthy Son      All the positives are listed in BOLD  Review  of Systems:  HEENT: Headache, blurred vision, runny nose, sore throat Neck: Hypothyroidism, hyperthyroidism,,lymphadenopathy Chest : Shortness of breath, history of COPD, Asthma Heart : Chest pain, history of coronary arterey disease GI:  Nausea, vomiting, diarrhea, constipation, GERD GU: Dysuria, urgency, frequency of urination, hematuria Neuro: Stroke, seizures, syncope Psych: Depression, anxiety, hallucinations   Physical Exam: Blood pressure 129/79, pulse 92, temperature 97.9 F (36.6 C), temperature source Oral, resp. rate 18, height 6' (1.829 m), weight 78.2 kg (172 lb 6.4 oz), SpO2 91.00%. Constitutional:   Patient is a well-developed and well-nourished male in no acute distress and cooperative with exam.  NG tube in place Head: Normocephalic and atraumatic Mouth: Mucus membranes moist Eyes: PERRL, EOMI, conjunctivae normal Neck: Supple, No Thyromegaly Cardiovascular: RRR, S1 normal, S2 normal Pulmonary/Chest: CTAB, no wheezes, rales, or rhonchi Abdominal: Soft. Positive right lower quadrant tenderness, ileostomy bag in place, non-distended, bowel sounds are normal, no masses, organomegaly, or guarding present.  Neurological: A&O x3, Strenght is normal and symmetric bilaterally, cranial nerve II-XII are grossly intact, no focal motor deficit, sensory intact to light touch bilaterally.  Extremities : No Cyanosis, Clubbing or Edema   Labs on Admission:  Results for orders placed during the hospital encounter of 09/15/13 (from the past 48 hour(s))  CBC WITH DIFFERENTIAL     Status: Abnormal   Collection Time    09/16/13 12:20 AM      Result Value Ref Range   WBC 13.5 (*) 4.0 - 10.5 K/uL   RBC 4.69  4.22 - 5.81 MIL/uL   Hemoglobin 16.1  13.0 - 17.0 g/dL   HCT 45.3  39.0 - 52.0 %   MCV 96.6  78.0 - 100.0 fL   MCH 34.3 (*) 26.0 - 34.0 pg   MCHC 35.5  30.0 - 36.0 g/dL   RDW 13.5  11.5 - 15.5 %   Platelets 191  150 - 400 K/uL   Neutrophils Relative % 81 (*) 43 - 77 %   Neutro Abs 11.0 (*) 1.7 - 7.7 K/uL   Lymphocytes Relative 11 (*) 12 - 46 %   Lymphs Abs 1.4  0.7 - 4.0 K/uL   Monocytes Relative 7  3 - 12 %   Monocytes Absolute 1.0  0.1 - 1.0 K/uL   Eosinophils Relative 1  0 - 5 %   Eosinophils Absolute 0.1  0.0 - 0.7 K/uL   Basophils Relative 0  0 - 1 %   Basophils Absolute 0.0  0.0 - 0.1 K/uL  COMPREHENSIVE METABOLIC PANEL     Status: Abnormal   Collection Time    09/16/13 12:20 AM      Result Value Ref Range   Sodium 142  137 - 147 mEq/L   Potassium 3.7  3.7 - 5.3 mEq/L   Chloride 104  96 - 112 mEq/L   CO2 24  19 - 32 mEq/L   Glucose, Bld 107 (*) 70 - 99 mg/dL   BUN 10  6 - 23 mg/dL   Creatinine, Ser 0.96  0.50 - 1.35 mg/dL   Calcium 9.8  8.4 - 10.5 mg/dL   Total Protein 7.2   6.0 - 8.3 g/dL   Albumin 4.2  3.5 - 5.2 g/dL   AST 23  0 - 37 U/L   ALT 19  0 - 53 U/L   Alkaline Phosphatase 91  39 - 117 U/L   Total Bilirubin 0.7  0.3 - 1.2 mg/dL   GFR calc non Af Amer 88 (*) >90 mL/min  GFR calc Af Amer >90  >90 mL/min   Comment: (NOTE)     The eGFR has been calculated using the CKD EPI equation.     This calculation has not been validated in all clinical situations.     eGFR's persistently <90 mL/min signify possible Chronic Kidney     Disease.  LIPASE, BLOOD     Status: None   Collection Time    09/16/13 12:20 AM      Result Value Ref Range   Lipase 20  11 - 59 U/L  URINALYSIS, ROUTINE W REFLEX MICROSCOPIC     Status: Abnormal   Collection Time    09/16/13  1:15 AM      Result Value Ref Range   Color, Urine YELLOW  YELLOW   APPearance CLEAR  CLEAR   Specific Gravity, Urine >1.030 (*) 1.005 - 1.030   pH 5.5  5.0 - 8.0   Glucose, UA NEGATIVE  NEGATIVE mg/dL   Hgb urine dipstick NEGATIVE  NEGATIVE   Bilirubin Urine NEGATIVE  NEGATIVE   Ketones, ur NEGATIVE  NEGATIVE mg/dL   Protein, ur 30 (*) NEGATIVE mg/dL   Urobilinogen, UA 0.2  0.0 - 1.0 mg/dL   Nitrite NEGATIVE  NEGATIVE   Leukocytes, UA NEGATIVE  NEGATIVE  URINE MICROSCOPIC-ADD ON     Status: Abnormal   Collection Time    09/16/13  1:15 AM      Result Value Ref Range   Squamous Epithelial / LPF RARE  RARE   WBC, UA 3-6  <3 WBC/hpf   RBC / HPF 0-2  <3 RBC/hpf   Bacteria, UA FEW (*) RARE   Crystals CA OXALATE CRYSTALS (*) NEGATIVE   Urine-Other MUCOUS PRESENT      Radiological Exams on Admission: Ct Abdomen Pelvis Wo Contrast  09/16/2013   CLINICAL DATA:  ABDOMINAL PAIN  EXAM: CT ABDOMEN AND PELVIS WITHOUT CONTRAST  TECHNIQUE: Multidetector CT imaging of the abdomen and pelvis was performed following the standard protocol without intravenous contrast.  COMPARISON:  None available.  FINDINGS: Scattered subsegmental atelectasis seen dependently within the left lower lobe. Fat containing  Bochdalek's hernia present at the right lung base air  Limited noncontrast evaluation of the liver is unremarkable. Gallbladder is surgically absent. No biliary ductal dilatation. The spleen, adrenal glands, and pancreas demonstrate a normal contrast enhanced appearance.  The kidneys are equal in size without evidence of nephrolithiasis, hydronephrosis, or focal enhancing renal mass.  Stomach is within normal limits. Metallic clips noted along the greater curvature of the stomach. The patient is status post subtotal colectomy with a right lower quadrant ileostomy present. There is a fat and small bowel containing peristomal hernia within the anterolateral right abdominal wall. The neck of the hernia measures 3.8 cm in diameter. Several loops of nondilated small bowel present within the hernia sac. There are multiple dilated loops of small bowel with scattered air-fluid levels seen proximal to the hernia, compatible with associated obstruction. These loops measure up to 3.5 cm in diameter. There is adjacent scattered free fluid interposed within the adjacent mesenteric fat. No free intraperitoneal air.  Bladder is unremarkable.  Prostate within normal limits.  Scattered atherosclerotic plaque present within the infrarenal aorta.  Prominent degenerative changes seen about the L2-3 and L5-S1 intervertebral disc spaces. No acute osseous abnormality. No worrisome lytic or blastic osseous lesions.  IMPRESSION: Fat and small bowel containing parastomal hernia adjacent to the right lower quadrant ileostomy with associated small bowel obstruction.   Electronically Signed  By: Jeannine Boga M.D.   On: 09/16/2013 01:18    Assessment/Plan Active Problems:   SBO (small bowel obstruction)  Crohn's disease  Small bowel obstruction Surgery was consulted by the ED physician, and they expressed their inability to take care of parastomal hernia. GI Dr. Corbin Ade was consulted by the ED physician who recommended medical  admission. He'll follow the patient in a.m. and give [name] further recommendations other transfer the patient to tertiary care center for keep here. We'll continue with Dilaudid 1 mg IV every 4 hours when necessary along with Zofran 4 mg IV every 8 hours when necessary for nausea and vomiting.  Crohn's disease Patient was taking prednisone 5 mg by mouth daily to home. Will start him on Solu-Medrol 20 mg IV daily.  Code status: Full code  Family discussion: Discussed with patient's wife at bedside   Time Spent on Admission: 63 minutes  [facility] Pager: [phone] 09/16/2013, 4:25 AM  If 7PM-7AM, please contact night-coverage  [email or web address]  Password [email or web address]

## 2013-09-16 NOTE — Consult Note (Signed)
Referring Provider: No ref. provider found Primary Care Physician:  [name], MD Primary Gastroenterologist:  Dr. Karilyn Cota  Reason for Consultation:    Small bowel obstruction in patient with history of ileocolonic Crohn's disease.  HPI:   Patient is 63 year old Caucasian male with history of Crohn's disease dating back to 89 whose last surgery was about 5 years ago when he had resection of stenotic ileocolonic anastomosis and ileostomy. He has been evaluated at [facility] and decision was made not to reverse his ileostomy. He has developed a parastomal hernia and has seen a Careers adviser in [facility] in order to get it fixed. He was in usual state of health when he woke up yesterday morning. He had his breakfast and went [place] to watch car racing. He ate lunch at [employer] around 3:00 and began having severe upper and mid abdominal pain around 6:30 pm. He came home and asked his wife to bring him to the emergency room. His wife was in on and gave him fleets enema through ileostomy but without any results.  He states he had 7 vomiting spells when he vomited fluid and food but no blood. Evaluation in the emergency room revealed him to have small bowel obstruction most likely secondary to large parastomal hernia. NG tube was placed for decompression and patient admitted to hospitalist service. This morning he has no dominant pain. He did receive pain medication about 40 minutes ago. He complains of sore throat from NG tube. He did pass some flatus into ileostomy bag once and he is small amount of liquid and ileostomy bag. He states he's had 10 episodes like this in 5 years but all of these episode lasted for a few to several minutes and resolved spontaneously. He has had fair appetite. He denies weight loss or rectal bleeding or bleeding into ileostomy. About  two weeks ago he passed grayish material per rectum and while straining he developed hemorrhoid but it has resolved. He  does not take NSAIDs.    Past Medical History  Diagnosis Date  . Crohn's disease   . Hernia, abdominal     Past Surgical History  Procedure Laterality Date  . Ileostomy    . Colonoscopy    . Upper gastrointestinal endoscopy    . Appendectomy    . Colon surgery      Prior to Admission medications   Medication Sig Start Date End Date Taking? Authorizing Provider  ALPRAZolam (XANAX) 0.5 MG tablet TAKE 1 TABLET AT BEDTIME AS NEEDED FOR SLEEP   Yes Malissa Hippo, MD  Ascorbic Acid (VITAMIN C) 1000 MG tablet Take 1,000 mg by mouth daily.   Yes Historical Provider, MD  azaTHIOprine (IMURAN) 50 MG tablet TAKE 1 TABLET BY MOUTH 3 TIMES DAILY. 06/04/13  Yes Malissa Hippo, MD  B Complex-C (SUPER B COMPLEX PO) Take by mouth daily.   Yes Historical Provider, MD  Calcium Carbonate-Vitamin D (CALCIUM 600 + D PO) Take by mouth 2 (two) times daily.   Yes Historical Provider, MD  cholestyramine Lanetta Inch) 4 G packet Take 1 packet by mouth 2 (two) times daily with a meal. 12/15/11 09/15/13 Yes Malissa Hippo, MD  HYDROcodone-acetaminophen (NORCO/VICODIN) 5-325 MG per tablet Take 1 tablet by mouth every 6 (six) hours as needed for moderate pain. 06/21/13  Yes Len Blalock, NP  loperamide (IMODIUM) 2 MG capsule Take 2 mg by mouth 4 (four) times daily as needed.   Yes Historical Provider, MD  Multiple Vitamins-Minerals (  MULTIVITAMIN PO) Take by mouth daily.   Yes Historical Provider, MD  predniSONE (DELTASONE) 5 MG tablet TAKE 1 TABLET BY MOUTH DAILY 08/17/13  Yes Len Blalock, NP  promethazine (PHENERGAN) 25 MG tablet Take 1 tablet (25 mg total) by mouth 2 (two) times daily as needed for nausea or vomiting. 04/15/13  Yes Malissa Hippo, MD  Zinc 50 MG CAPS Take by mouth daily.   Yes Historical Provider, MD  hyoscyamine (LEVSIN SL) 0.125 MG SL tablet Place 1 tablet (0.125 mg total) under the tongue every 4 (four) hours as needed. 04/15/13   Malissa Hippo, MD    Current Facility-Administered  Medications  Medication Dose Route Frequency Provider Last Rate Last Dose  . 0.9 %  sodium chloride infusion   Intravenous Continuous Standley Brooking, MD      . dextrose 5 %-0.45 % sodium chloride infusion   Intravenous STAT Hurman Horn, MD 125 mL/hr at 09/16/13 0424    . enoxaparin (LOVENOX) injection 40 mg  40 mg Subcutaneous Q24H Meredeth Ide, MD   40 mg at 09/16/13 0830  . HYDROmorphone (DILAUDID) injection 1 mg  1 mg Intravenous Q4H PRN Hurman Horn, MD   1 mg at 09/16/13 0831  . lidocaine (XYLOCAINE) 2 % jelly           . methylPREDNISolone sodium succinate (SOLU-MEDROL) 40 mg/mL injection 20 mg  20 mg Intravenous Daily Meredeth Ide, MD   20 mg at 09/16/13 0830  . ondansetron (ZOFRAN) injection 4 mg  4 mg Intravenous Q8H PRN Hurman Horn, MD        Allergies as of 09/15/2013 - Review Complete 09/15/2013  Allergen Reaction Noted  . Ivp dye [iodinated diagnostic agents]  06/01/2011  . Demerol Rash 06/01/2011    Family History  Problem Relation Age of Onset  . Healthy Mother   . Healthy Daughter   . Healthy Son     History   Social History  . Marital Status: Married    Spouse Name: N/A    Number of Children: N/A  . Years of Education: N/A   Occupational History  . Not on file.   Social History Main Topics  . Smoking status: Former Smoker    Types: Cigars    Quit date: 05/31/2006  . Smokeless tobacco: Former Neurosurgeon  . Alcohol Use: No  . Drug Use: No  . Sexual Activity: No   Other Topics Concern  . Not on file   Social History Narrative  . No narrative on file    Review of Systems: See HPI, otherwise normal ROS  Physical Exam: Temp:  [97.7 F (36.5 C)-98.3 F (36.8 C)] 97.9 F (36.6 C) (05/02 0323) Pulse Rate:  [88-92] 92 (05/02 0323) Resp:  [18-20] 18 (05/02 0323) BP: (113-129)/(57-79) 129/79 mmHg (05/02 0323) SpO2:  [91 %-100 %] 91 % (05/02 0323) Weight:  [170 lb (77.111 kg)-172 lb 6.4 oz (78.2 kg)] 172 lb 6.4 oz (78.2 kg) (05/02 0323)   Patient  is alert and in no acute distress. NG tube is in place with bilious fluid in it. Conjunctiva is pink. Sclerae nonicteric. Oropharyngeal mucosa is normal. No neck masses or thyromegaly noted. Cardiac exam with a regular rhythm normal S1 and S2. No murmur or gallop noted. Lungs are clear to auscultation. Abdomen is asymmetric with swelling around the ileostomy. Bowel sounds are normal. Abdomen is soft with large parastomal hernia which is somewhat tense but no tenderness noted. No  organomegaly or masses. He does not have peripheral edema or clubbing. 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  Lab Results:  Recent Labs  09/16/13 0020  WBC 13.5*  HGB 16.1  HCT 45.3  PLT 191   BMET  Recent Labs  09/16/13 0020  NA 142  K 3.7  CL 104  CO2 24  GLUCOSE 107*  BUN 10  CREATININE 0.96  CALCIUM 9.8   LFT  Recent Labs  09/16/13 0020  PROT 7.2  ALBUMIN 4.2  AST 23  ALT 19  ALKPHOS 91  BILITOT 0.7    Studies/Results: Ct Abdomen Pelvis Wo Contrast  09/16/2013   CLINICAL DATA:  ABDOMINAL PAIN  EXAM: CT ABDOMEN AND PELVIS WITHOUT CONTRAST  TECHNIQUE: Multidetector CT imaging of the abdomen and pelvis was performed following the standard protocol without intravenous contrast.  COMPARISON:  None available.  FINDINGS: Scattered subsegmental atelectasis seen dependently within the left lower lobe. Fat containing Bochdalek's hernia present at the right lung base air  Limited noncontrast evaluation of the liver is unremarkable. Gallbladder is surgically absent. No biliary ductal dilatation. The spleen, adrenal glands, and pancreas demonstrate a normal contrast enhanced appearance.  The kidneys are equal in size without evidence of nephrolithiasis, hydronephrosis, or focal enhancing renal mass.  Stomach is within normal limits. Metallic clips noted along the greater curvature of the stomach. The patient is status post subtotal colectomy with a right lower quadrant ileostomy present. There  is a fat and small bowel containing peristomal hernia within the anterolateral right abdominal wall. The neck of the hernia measures 3.8 cm in diameter. Several loops of nondilated small bowel present within the hernia sac. There are multiple dilated loops of small bowel with scattered air-fluid levels seen proximal to the hernia, compatible with associated obstruction. These loops measure up to 3.5 cm in diameter. There is adjacent scattered free fluid interposed within the adjacent mesenteric fat. No free intraperitoneal air.  Bladder is unremarkable.  Prostate within normal limits.  Scattered atherosclerotic plaque present within the infrarenal aorta.  Prominent degenerative changes seen about the L2-3 and L5-S1 intervertebral disc spaces. No acute osseous abnormality. No worrisome lytic or blastic osseous lesions.  IMPRESSION: Fat and small bowel containing parastomal hernia adjacent to the right lower quadrant ileostomy with associated small bowel obstruction.   Electronically Signed   By: Rise MuBenjamin  McClintock M.D.   On: 09/16/2013 01:18   I have reviewed abdominopelvic CT.  Assessment; Patient is 63 year old Caucasian male with complicated history of ileocolonic Crohn's disease who presents with acute onset of abdominal pain and no output into ileostomy and on workup found to have small bowel obstruction at the level of   para stomal hernia. He is symptomatically improved but small bowel obstruction has not reversed. Review of CT does not reveal evidence of active Crohn's disease. If medical therapy fails he will need surgical intervention during this admission or in near future if SBO resolved with medical therapy. Will continue to hold azathioprine while NPO. Agree with IV steroids more for maintenance and not for relapse since he has been on prednisone 38 years.   Recommendations; Patient advised to turn in bed periodically and also to ambulate. Continue NPO and NG suction. CBC and metabolic-7  in a.m.    LOS: 1 day   Ramere Downs U Leinaala Catanese  09/16/2013, 9:29 AM

## 2013-09-17 ENCOUNTER — Inpatient Hospital Stay (HOSPITAL_COMMUNITY): Payer: Managed Care, Other (non HMO)

## 2013-09-17 LAB — BASIC METABOLIC PANEL
BUN: 12 mg/dL (ref 6–23)
CALCIUM: 8.6 mg/dL (ref 8.4–10.5)
CO2: 26 meq/L (ref 19–32)
Chloride: 107 mEq/L (ref 96–112)
Creatinine, Ser: 0.83 mg/dL (ref 0.50–1.35)
GFR calc Af Amer: >90 mL/min (ref >90)
Glucose, Bld: 116 mg/dL — ABNORMAL HIGH (ref 70–99)
Potassium: 3.8 mEq/L (ref 3.7–5.3)
Sodium: 142 mEq/L (ref 137–147)

## 2013-09-17 LAB — CBC
HCT: 42 % (ref 39.0–52.0)
Hemoglobin: 14.7 g/dL (ref 13.0–17.0)
MCH: 34.3 pg — ABNORMAL HIGH (ref 26.0–34.0)
MCHC: 35 g/dL (ref 30.0–36.0)
MCV: 97.9 fL (ref 78.0–100.0)
PLATELETS: 195 10*3/uL (ref 150–400)
RBC: 4.29 MIL/uL (ref 4.22–5.81)
RDW: 13.7 % (ref 11.5–15.5)
WBC: 13.4 10*3/uL — AB (ref 4.0–10.5)

## 2013-09-17 MED ORDER — TRAMADOL HCL 50 MG PO TABS
50.0000 mg | ORAL_TABLET | Freq: Four times a day (QID) | ORAL | Status: DC | PRN
  Administered 2013-09-17: 50 mg via ORAL
  Filled 2013-09-17: qty 1

## 2013-09-17 MED ORDER — ALPRAZOLAM 0.5 MG PO TABS
0.5000 mg | ORAL_TABLET | Freq: Every evening | ORAL | Status: DC | PRN
  Administered 2013-09-17: 0.5 mg via ORAL
  Filled 2013-09-17: qty 1

## 2013-09-17 MED ORDER — HYDROCORTISONE NA SUCCINATE PF 100 MG IJ SOLR
50.0000 mg | Freq: Three times a day (TID) | INTRAMUSCULAR | Status: DC
  Administered 2013-09-17 – 2013-09-18 (×4): 50 mg via INTRAVENOUS
  Filled 2013-09-17 (×4): qty 2

## 2013-09-17 MED ORDER — ACETAMINOPHEN 325 MG PO TABS: 650.0000 mg | ORAL_TABLET | Freq: Four times a day (QID) | ORAL | Status: DC | PRN

## 2013-09-17 NOTE — Progress Notes (Signed)
Surgery  Filed Vitals:   09/17/13 0722  BP: 110/51  Pulse: 65  Temp: 98.6 F (37 C)  Resp: 20   Pt.resting comfortably.  Abdomen soft and stoma looks good without peristomal pain.  Ostomy output no normal.  Films reviewed.NG removed.  Clinically pt responding to med treatment.  Have advised elective surgery for parastomal hernia.  Will sign off. Thanks,WSB

## 2013-09-17 NOTE — Progress Notes (Signed)
I have attempted to contact Dr. Karilyn Cota multiple times on his cellular phone and at the hospital without success.  Reading his note there is no urgency in transferring the patient here for surgery.  He does not appear to be obstructed anymore.  Not sure why the patient has to be transferred here unless his original surgeon was in our group (currently I cannot identify who the surgeon was). There is nothing about his overall condition that I can see that requires transfer for surgery.  I will await to discuss this with Dr. Karilyn Cota.  I have not accepted this patient in transfer.  Marta Lamas. Gae Bon, MD, FACS [phone] [facility]

## 2013-09-17 NOTE — Progress Notes (Signed)
  PROGRESS NOTE  Lee ZIRKELBACH BJS:[id number] DOB: 1951/04/09 DOA: 09/15/2013 PCP: [name], MD  Summary: 63 year old man with history of Crohn's disease,  Assessment/Plan: 1. 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 Clinically improving with apparent resolution. 2. History of Crohn's disease and parastomal hernia.    Continues to improve with resolution of pain, vomiting. Stable with removal of NG tube. Ostomy appears to be function. Discussed with general surgery and GI. Plan continued observation, recommend hernia repair in the near future.  Code Status: full code DVT prophylaxis: Lovenox Family Communication: none present Disposition Plan: pending  Brendia Sacks, MD  [facility]  Pager [phone] If 7PM-7AM, please contact night-coverage at [email or web address], password [facility] 09/17/2013, 4:00 PM  LOS: 2 days   Consultants:  General surgery  Gastroenterology  Procedures:    Antibiotics:    HPI/Subjective: Continues to feel better. Ostomy functioning. No abdominal pain. No vomiting. NG tube removed.  Objective: Filed Vitals:   09/16/13 1505 09/16/13 2222 09/17/13 0722 09/17/13 1444  BP: 121/75 115/71 110/51 115/61  Pulse: 91 68 65 72  Temp: 98.1 F (36.7 C) 98.3 F (36.8 C) 98.6 F (37 C) 97.9 F (36.6 C)  TempSrc: Axillary Oral Oral Oral  Resp: 20 20 20 20   Height:      Weight:      SpO2: 93% 91% 92% 94%    Intake/Output Summary (Last 24 hours) at 09/17/13 1600 Last data filed at 09/17/13 1355  Gross per 24 hour  Intake    480 ml  Output   1900 ml  Net  -1420 ml     Filed Weights   09/15/13 2353 09/16/13 0323  Weight: 77.111 kg (170 lb) 78.2 kg (172 lb 6.4 oz)    Exam:   Afebrile, vital signs are stable. No hypoxia.  Gen. Appears comfortable, calm.  Psychiatric. Grossly normal mood and affect. Speech fluent and appropriate.  Cardiovascular. Regular rate and rhythm. No murmur,  rub or gallop.  Respiratory. Clear to auscultation bilaterally. No wheezes, rales or rhonchi. Normal respiratory effort.  Abdomen soft, nondistended. Ostomy appears grossly unremarkable with stool output noted.  Data Reviewed:  Basic metabolic panel unremarkable.  CBC stable.  Scheduled Meds: . acetaminophen  1,000 mg Intravenous 4 times per day  . enoxaparin (LOVENOX) injection  40 mg Subcutaneous Q24H  . hydrocortisone sod succinate (SOLU-CORTEF) inj  50 mg Intravenous Q8H   Continuous Infusions: . dextrose 5 % and 0.9 % NaCl with KCl 20 mEq/L 75 mL/hr at 09/17/13 1300    Active Problems:   SBO (small bowel obstruction)   Time spent 15 minutes

## 2013-09-17 NOTE — Progress Notes (Addendum)
Subjective; Patient states he's been passing liquid stool and flatus since 10 PM last night. He denies abdominal pain. He is complaining of sore throat and wants NG tube removed. He is agreeable to be transferred to [facility].  Objective; BP 110/51  Pulse 65  Temp(Src) 98.6 F (37 C) (Oral)  Resp 20  Ht 6' (1.829 m)  Wt 172 lb 6.4 oz (78.2 kg)  BMI 23.38 kg/m2  SpO2 92% Patient is alert and in no acute distress. Cardiac exam with regular rhythm normal S1 and S2. No murmur or gallop noted. Lungs are clear to auscultation. Abdomen is asymmetric with the bulging right flank due to parastomal hernia. Ileostomy bag is filled with greenish liquid. Bowel sounds are hypoactive. Abdomen is soft with mild hypogastric tenderness. Parastomal hernia is soft and without tenderness. No LE edema.  NG output last 24 hours was 1000 mL Urine output 250 mL but not accurate.  Lab data; WBC 13.4, H&H 14.7 and 42.0 and platelet count 195K Serum sodium 142, potassium 3.8, crit 107, CO2 26, BUN 12, creatinine 0.83 and  glucose 116 Serum calcium 8.6  Assessment; Small bowel obstruction secondary to large parastomal hernia. He seemed to have opened up. He is passing flatus and liquid stool. Patient is agreeable to have this hernia repaired so that this probably would not recur.  Recommendations; DC NG tube. Change steroids to only Cortef 50 mg IV every 8 hours. Will contact Dr. Jimmye NormanJames Wyatt  [facility].

## 2013-09-18 ENCOUNTER — Encounter (INDEPENDENT_AMBULATORY_CARE_PROVIDER_SITE_OTHER): Payer: Self-pay | Admitting: *Deleted

## 2013-09-18 NOTE — Progress Notes (Signed)
Subjective; Patient has no complaints. He denies abdominal pain. He is passing liquid stool and flatus frequently. He feels hernia ascites is back to baseline.   Objective; BP 123/78  Pulse 78  Temp(Src) 98 F (36.7 C) (Oral)  Resp 18  Ht 6' (1.829 m)  Wt 172 lb 6.4 oz (78.2 kg)  BMI 23.38 kg/m2  SpO2 94% Patient is alert and in no acute distress. Abdominal exam reveals normal bowel sounds. Abdomen is soft. He has parastomal hernia which is very soft and nontender. Ileostomy bag has greenish liquid stool to   Assessment; Small bowel obstruction secondary to large parastomal hernia has resolved for now. NG tube was removed yesterday morning and he's been tolerating soft diet. If patient does not with his breakfast he can be discharged.  Recommendations; Will arrange for patient to be seen by Dr. Frederik Schmidt on an outpatient basis later this week and hopefully he can have surgery next week.

## 2013-09-18 NOTE — Progress Notes (Signed)
  PROGRESS NOTE  Lee Klein [id number] DOB: 02-Jun-1950 DOA: 09/15/2013 PCP: Estanislado PandySASSER,Lee W, MD  Summary: 63 year old man with history of Crohn's disease,  Assessment/Plan: 1. Small bowel obstruction secondary to parastomal hernia adjacent to right lower quadrant ileostomy. Clinically resolved. 2. History of Crohn's disease and parastomal hernia.    Tolerating diet. No pain. Obstruction clinically resolved. Discussed with Dr. Karilyn Klein, patient stable for discharge on outpatient medications with no changes. Outpatient followup with general surgery will be arranged in the near future by GI.  Discussed with wife at bedside.  Lee Sacksaniel Lee Frazee, MD  [facility]  Pager [phone] If 7PM-7AM, please contact night-coverage at [email or web address], password [email or web address] 09/18/2013, 9:32 AM  LOS: 3 days   Consultants:  General surgery  Gastroenterology  Procedures:    Antibiotics:    HPI/Subjective: No issues overnight. Ostomy is functioning well with stool and gas output. Tolerating diet. No abdominal pain. No nausea or vomiting.  Objective: Filed Vitals:   09/17/13 0722 09/17/13 1444 09/18/13 0100 09/18/13 0550  BP: 110/51 115/61 112/58 123/78  Pulse: 65 72 93 78  Temp: 98.6 F (37 C) 97.9 F (36.6 C) 97.5 F (36.4 C) 98 F (36.7 C)  TempSrc: Oral Oral Oral   Resp: 20 20 17 18   Height:      Weight:      SpO2: 92% 94% 95% 94%    Intake/Output Summary (Last 24 hours) at 09/18/13 0932 Last data filed at 09/17/13 1802  Gross per 24 hour  Intake    720 ml  Output    350 ml  Net    370 ml     Filed Weights   09/15/13 2353 09/16/13 0323  Weight: 77.111 kg (170 lb) 78.2 kg (172 lb 6.4 oz)    Exam:   Afebrile, vital signs are stable. No hypoxia.  Gen. Appears calm and comfortable. Well-appearing.  Cardiovascular. Regular rate and rhythm. No murmur, rub or gallop.   Respiratory. Clear to auscultation bilaterally. No wheezes, rales or rhonchi. Normal respiratory  effort.  Abdomen soft, nontender. Ostomy appears to be functioning well with a large amount of liquid stool in the bag.  Psychiatric. Grossly normal mood and affect. Speech fluent and appropriate.  Data Reviewed:    Scheduled Meds: . enoxaparin (LOVENOX) injection  40 mg Subcutaneous Q24H  . hydrocortisone sod succinate (SOLU-CORTEF) inj  50 mg Intravenous Q8H   Continuous Infusions:    Active Problems:   SBO (small bowel obstruction)

## 2013-09-18 NOTE — Discharge Summary (Signed)
Physician Discharge Summary  Lee LeepDanny T Klein [id number] DOB: 1950/10/17 DOA: 09/15/2013  PCP: Estanislado PandySASSER,PAUL W, MD  Admit date: 09/15/2013 Discharge date: 09/18/2013  Recommendations for Outpatient Follow-up:  1. Large peristomal hernia with recent small bowel obstruction.   Follow-up Information   Follow up with WYATT, Marta LamasJAMES O, MD. (office will call you with appointment)    Specialty:  General Surgery   Contact information:   [address], [place] [address]  [facility] [place] [id number] [phone]      Discharge Diagnoses:  1. Small bowel obstruction secondary to large parastomal hernia 2. History of Crohn's disease with right lower quadrant ileostomy, peristomal hernia  Discharge Condition: Improved Disposition: Home  Diet recommendation: Regular. Avoid nuts, seeds, popcorn.  Filed Weights   09/15/13 2353 09/16/13 0323  Weight: 77.111 kg (170 lb) 78.2 kg (172 lb 6.4 oz)    History of present illness:  63 year old man with history of Crohn's disease, ileostomy presented with acute onset of abdominal pain, ileostomy dysfunction, nausea and vomiting. Admitted for small bowel obstruction secondary peristomal hernia.  Hospital Course:  Patient was admitted to the medical floor and treated with supportive care, seen in consultation with gastroenterology and general surgery. With supportive care, small bowel obstruction resolved, NG tube was removed and patient was tolerating a diet with no abdominal pain. He has been cleared for discharge by gastroenterology and general surgery and will followup as an outpatient for consideration of parastomal hernia repair.  Discharge Instructions  Discharge Orders   Future Appointments Provider Department Dept Phone   12/19/2013 3:45 PM Malissa HippoNajeeb U Rehman, MD [facility] [phone]   Future Orders Complete By Expires   Activity as tolerated - No restrictions  As directed    Discharge instructions  As  directed        Medication List    STOP taking these medications       loperamide 2 MG capsule  Commonly known as:  IMODIUM      TAKE these medications       ALPRAZolam 0.5 MG tablet  Commonly known as:  XANAX  TAKE 1 TABLET AT BEDTIME AS NEEDED FOR SLEEP     azaTHIOprine 50 MG tablet  Commonly known as:  IMURAN  Take 50-100 mg by mouth 2 (two) times daily. Takes 2 in am and 1 at night     CALCIUM 600 + D PO  Take 1 tablet by mouth 2 (two) times daily.     cholestyramine 4 G packet  Commonly known as:  QUESTRAN  Take 1 packet by mouth 2 (two) times daily with a meal.     HYDROcodone-acetaminophen 5-325 MG per tablet  Commonly known as:  NORCO/VICODIN  Take 0.5 tablets by mouth at bedtime as needed for moderate pain.     MULTIVITAMIN PO  Take 1 tablet by mouth daily.     predniSONE 5 MG tablet  Commonly known as:  DELTASONE  TAKE 1 TABLET BY MOUTH DAILY     SUPER B COMPLEX PO  Take 1 tablet by mouth daily.     vitamin C 1000 MG tablet  Take 1,000 mg by mouth daily.     Zinc 50 MG Caps  Take 1 capsule by mouth daily.       Allergies  Allergen Reactions  . Ivp Dye [Iodinated Diagnostic Agents]     Joint pain  . Demerol Rash    The results of significant diagnostics from this hospitalization (  including imaging, microbiology, ancillary and laboratory) are listed below for reference.    Significant Diagnostic Studies: Ct Abdomen Pelvis Wo Contrast  09/16/2013   CLINICAL DATA:  ABDOMINAL PAIN  EXAM: CT ABDOMEN AND PELVIS WITHOUT CONTRAST  TECHNIQUE: Multidetector CT imaging of the abdomen and pelvis was performed following the standard protocol without intravenous contrast.  COMPARISON:  None available.  FINDINGS: Scattered subsegmental atelectasis seen dependently within the left lower lobe. Fat containing Bochdalek's hernia present at the right lung base air  Limited noncontrast evaluation of the liver is unremarkable. Gallbladder is surgically absent. No  biliary ductal dilatation. The spleen, adrenal glands, and pancreas demonstrate a normal contrast enhanced appearance.  The kidneys are equal in size without evidence of nephrolithiasis, hydronephrosis, or focal enhancing renal mass.  Stomach is within normal limits. Metallic clips noted along the greater curvature of the stomach. The patient is status post subtotal colectomy with a right lower quadrant ileostomy present. There is a fat and small bowel containing peristomal hernia within the anterolateral right abdominal wall. The neck of the hernia measures 3.8 cm in diameter. Several loops of nondilated small bowel present within the hernia sac. There are multiple dilated loops of small bowel with scattered air-fluid levels seen proximal to the hernia, compatible with associated obstruction. These loops measure up to 3.5 cm in diameter. There is adjacent scattered free fluid interposed within the adjacent mesenteric fat. No free intraperitoneal air.  Bladder is unremarkable.  Prostate within normal limits.  Scattered atherosclerotic plaque present within the infrarenal aorta.  Prominent degenerative changes seen about the L2-3 and L5-S1 intervertebral disc spaces. No acute osseous abnormality. No worrisome lytic or blastic osseous lesions.  IMPRESSION: Fat and small bowel containing parastomal hernia adjacent to the right lower quadrant ileostomy with associated small bowel obstruction.   Electronically Signed   By: Rise Mu M.D.   On: 09/16/2013 01:18   Dg Abd 2 Views  09/17/2013   CLINICAL DATA:  Hernia.  EXAM: ABDOMEN - 2 VIEW  COMPARISON:  CT ABD/PELV WO CM dated 09/16/2013  FINDINGS: Findings consistent with herniated small bowel along the right mid abdomen again noted. Mild small bowel distention is present. Persistent small bowel obstruction is most likely present. No free air identified. Surgical clips upper abdomen. Mild basilar atelectasis and/or right pleural effusion noted.  IMPRESSION:  Persistent changes of right mid abdominal hernia with herniation of bowel and probable persistent small bowel obstruction.   Electronically Signed   By: Maisie Fus  Register   On: 09/17/2013 14:25    Labs: Basic Metabolic Panel:  Recent Labs Lab 09/16/13 0020 09/17/13 0508  NA 142 142  K 3.7 3.8  CL 104 107  CO2 24 26  GLUCOSE 107* 116*  BUN 10 12  CREATININE 0.96 0.83  CALCIUM 9.8 8.6   Liver Function Tests:  Recent Labs Lab 09/16/13 0020  AST 23  ALT 19  ALKPHOS 91  BILITOT 0.7  PROT 7.2  ALBUMIN 4.2    Recent Labs Lab 09/16/13 0020  LIPASE 20   CBC:  Recent Labs Lab 09/16/13 0020 09/17/13 0508  WBC 13.5* 13.4*  NEUTROABS 11.0*  --   HGB 16.1 14.7  HCT 45.3 42.0  MCV 96.6 97.9  PLT 191 195    Time coordinating discharge: 25 minutes  Signed:  Brendia Sacks, MD [facility] 09/18/2013, 9:37 AM

## 2013-09-19 NOTE — Progress Notes (Signed)
Discharge instructions given, verbalized understanding, out in stable condition ambulatory with staff. 

## 2013-09-20 ENCOUNTER — Telehealth (INDEPENDENT_AMBULATORY_CARE_PROVIDER_SITE_OTHER): Payer: Self-pay | Admitting: *Deleted

## 2013-09-20 DIAGNOSIS — Z0289 Encounter for other administrative examinations: Secondary | ICD-10-CM

## 2013-09-20 DIAGNOSIS — K219 Gastro-esophageal reflux disease without esophagitis: Secondary | ICD-10-CM

## 2013-09-20 MED ORDER — PANTOPRAZOLE SODIUM 40 MG PO TBEC: 40.0000 mg | DELAYED_RELEASE_TABLET | Freq: Every day | ORAL | Status: DC

## 2013-09-20 NOTE — Telephone Encounter (Signed)
Rx for Protonix sent to pharmacy

## 2013-09-20 NOTE — Telephone Encounter (Signed)
Dannielle HuhDanny is having indigestion and reflux. Would like to know if there is anything that can be called in? The return phone number is [phone].

## 2013-10-03 ENCOUNTER — Ambulatory Visit (INDEPENDENT_AMBULATORY_CARE_PROVIDER_SITE_OTHER): Payer: Managed Care, Other (non HMO) | Admitting: General Surgery

## 2013-10-03 ENCOUNTER — Encounter (INDEPENDENT_AMBULATORY_CARE_PROVIDER_SITE_OTHER): Payer: Self-pay | Admitting: General Surgery

## 2013-10-03 VITALS — BP 126/78 | HR 69 | Temp 98.0°F | Ht 72.0 in | Wt 175.0 lb

## 2013-10-03 DIAGNOSIS — K460 Unspecified abdominal hernia with obstruction, without gangrene: Secondary | ICD-10-CM

## 2013-10-03 DIAGNOSIS — K433 Parastomal hernia with obstruction, without gangrene: Secondary | ICD-10-CM

## 2013-10-03 DIAGNOSIS — K435 Parastomal hernia without obstruction or  gangrene: Secondary | ICD-10-CM | POA: Insufficient documentation

## 2013-10-03 NOTE — Progress Notes (Signed)
Patient ID: Lee Klein, male   DOB: 1951/04/17, 63 y.o.   MRN: [id number]  Chief Complaint  Patient presents with  . eval hernia    HPI Lee Klein is a 63 y.o. male.  With a large right sided parastomal ileostomy hernia  HPI Patient recently was hospitalized for a bowel obstruction likely secondary to a large parastomal hernia.  The obstruction has resolved and the patient has been able to go back to work.  He has known about the hernia for more that a year, and it has significantly enlarged in size.  They saw his previous surgeon who told the patient that more than likely he would need surgery for the hernia. Last operation was 5 years ago for a colonic stricture at the anastomosis from a colostomy reversal.   Past Medical History  Diagnosis Date  . Crohn's disease   . Hernia, abdominal     Past Surgical History  Procedure Laterality Date  . Ileostomy    . Colonoscopy    . Upper gastrointestinal endoscopy    . Appendectomy    . Colon surgery      Family History  Problem Relation Age of Onset  . Healthy Mother   . Healthy Daughter   . Healthy Son     Social History History  Substance Use Topics  . Smoking status: Former Smoker    Types: Cigars    Quit date: 05/31/2006  . Smokeless tobacco: Former Neurosurgeon  . Alcohol Use: No    Allergies  Allergen Reactions  . Ivp Dye [Iodinated Diagnostic Agents]     Joint pain  . Demerol Rash    Current Outpatient Prescriptions  Medication Sig Dispense Refill  . ALPRAZolam (XANAX) 0.5 MG tablet TAKE 1 TABLET AT BEDTIME AS NEEDED FOR SLEEP  90 tablet  0  . Ascorbic Acid (VITAMIN C) 1000 MG tablet Take 1,000 mg by mouth daily.      Marland Kitchen azaTHIOprine (IMURAN) 50 MG tablet Take 50-100 mg by mouth 2 (two) times daily. Takes 2 in am and 1 at night      . B Complex-C (SUPER B COMPLEX PO) Take 1 tablet by mouth daily.       . Calcium Carbonate-Vitamin D (CALCIUM 600 + D PO) Take 1 tablet by mouth 2 (two) times daily.       Marland Kitchen  HYDROcodone-acetaminophen (NORCO/VICODIN) 5-325 MG per tablet Take 0.5 tablets by mouth at bedtime as needed for moderate pain.      . Multiple Vitamins-Minerals (MULTIVITAMIN PO) Take 1 tablet by mouth daily.       . pantoprazole (PROTONIX) 40 MG tablet Take 1 tablet (40 mg total) by mouth daily.  90 tablet  3  . predniSONE (DELTASONE) 5 MG tablet TAKE 1 TABLET BY MOUTH DAILY  90 tablet  1  . Zinc 50 MG CAPS Take 1 capsule by mouth daily.       . cholestyramine (QUESTRAN) 4 G packet Take 1 packet by mouth 2 (two) times daily with a meal.  60 each  12   No current facility-administered medications for this visit.    Review of Systems Review of Systems  Blood pressure 126/78, pulse 69, temperature 98 F (36.7 C), height 6' (1.829 m), weight 175 lb (79.379 kg).  Physical Exam Physical Exam  Vitals reviewed. Constitutional: He is oriented to person, place, and time. He appears well-developed.  HENT:  Head: Normocephalic and atraumatic.  Somewhat Moon facies with redness from chronic steroids  Eyes: Conjunctivae and EOM are normal. Pupils are equal, round, and reactive to light.  Neck: Normal range of motion.  Cardiovascular: Normal rate, regular rhythm and normal heart sounds.   Pulmonary/Chest: Effort normal and breath sounds normal.  Abdominal: Soft. Bowel sounds are normal. There is no tenderness. There is no rigidity, no rebound, no guarding and no CVA tenderness.    Musculoskeletal: Normal range of motion.  Neurological: He is alert and oriented to person, place, and time. He has normal reflexes.  Skin: Skin is warm and dry.  Psychiatric: He has a normal mood and affect. His behavior is normal. Judgment and thought content normal.    Data Reviewed I have reviewedhis labs and previous hospital admission.  Assessment    Asymptomatic large right sided parastomal hernia with a history of obstruction     Plan    Ileostomy revision and parastomal hernia repair. The patient  would like to have the ileostomy higher on his right abdominal wall which is what I was planning anyway.        Cherylynn RidgesJames O Myrella Fahs 10/03/2013, 9:05 AM

## 2013-10-11 ENCOUNTER — Encounter (HOSPITAL_COMMUNITY): Payer: Self-pay | Admitting: Pharmacy Technician

## 2013-10-16 HISTORY — PX: PARASTOMAL HERNIA REPAIR: SHX2162

## 2013-10-16 NOTE — Pre-Procedure Instructions (Signed)
Dereck LeepDanny T Rabago  10/16/2013   Your procedure is scheduled on:  Monday, June 8.  [facility]30 AM.  Call this number if you have problems the morning of surgery: [phone]   Remember:   Do not eat food or drink liquids after midnight Sunday, June 7.   Take these medicines the morning of surgery with A SIP OF WATER: azaTHIOprine (IMURAN), pantoprazole (PROTONIX).                Take if needed:ALPRAZolam Prudy Feeler(XANAX), HYDROcodone-acetaminophen (NORCO/VICODIN).    Do not wear jewelry, make-up or nail polish.  Do not wear lotions, powders, or perfumes.   Men may shave face and neck.  Do not bring valuables to the hospital.               [facility] is not responsible for any belongings or valuables.               Contacts, dentures or bridgework may not be worn into surgery.  Leave suitcase in the car. After surgery it may be brought to your room.  For patients admitted to the hospital, discharge time is determined by your treatment team.                 Special Instructions: -   Please read over the following fact sheets that you were given: Pain Booklet, Coughing and Deep Breathing, Blood Transfusion Information and Surgical Site Infection Prevention

## 2013-10-17 ENCOUNTER — Encounter (HOSPITAL_COMMUNITY)
Admission: RE | Admit: 2013-10-17 | Discharge: 2013-10-17 | Disposition: A | Discharge status: Home / Self Care | Payer: Managed Care, Other (non HMO) | Source: Ambulatory Visit | Attending: General Surgery | Admitting: General Surgery

## 2013-10-17 ENCOUNTER — Other Ambulatory Visit (HOSPITAL_COMMUNITY): Payer: Managed Care, Other (non HMO)

## 2013-10-17 ENCOUNTER — Encounter (HOSPITAL_COMMUNITY): Payer: Self-pay

## 2013-10-17 DIAGNOSIS — Z01818 Encounter for other preprocedural examination: Secondary | ICD-10-CM | POA: Insufficient documentation

## 2013-10-17 DIAGNOSIS — Z01812 Encounter for preprocedural laboratory examination: Secondary | ICD-10-CM | POA: Insufficient documentation

## 2013-10-17 DIAGNOSIS — Z01810 Encounter for preprocedural cardiovascular examination: Secondary | ICD-10-CM | POA: Insufficient documentation

## 2013-10-17 HISTORY — DX: Gastro-esophageal reflux disease without esophagitis: K21.9

## 2013-10-17 LAB — COMPREHENSIVE METABOLIC PANEL
ALBUMIN: 3.8 g/dL (ref 3.5–5.2)
ALT: 19 U/L (ref 0–53)
AST: 21 U/L (ref 0–37)
Alkaline Phosphatase: 86 U/L (ref 39–117)
BUN: 14 mg/dL (ref 6–23)
CHLORIDE: 105 meq/L (ref 96–112)
CO2: 22 mEq/L (ref 19–32)
CREATININE: 0.73 mg/dL (ref 0.50–1.35)
Calcium: 9.6 mg/dL (ref 8.4–10.5)
GFR calc non Af Amer: >90 mL/min (ref >90)
GLUCOSE: 103 mg/dL — AB (ref 70–99)
Potassium: 4.1 mEq/L (ref 3.7–5.3)
Sodium: 139 mEq/L (ref 137–147)
Total Bilirubin: 0.5 mg/dL (ref 0.3–1.2)
Total Protein: 6.6 g/dL (ref 6.0–8.3)

## 2013-10-17 LAB — CBC WITH DIFFERENTIAL/PLATELET
BASOS PCT: 0 % (ref 0–1)
Basophils Absolute: 0 10*3/uL (ref 0.0–0.1)
Eosinophils Absolute: 0 10*3/uL (ref 0.0–0.7)
Eosinophils Relative: 0 % (ref 0–5)
HEMATOCRIT: 41.1 % (ref 39.0–52.0)
HEMOGLOBIN: 14.1 g/dL (ref 13.0–17.0)
Lymphocytes Relative: 5 % — ABNORMAL LOW (ref 12–46)
Lymphs Abs: 0.5 10*3/uL — ABNORMAL LOW (ref 0.7–4.0)
MCH: 33.7 pg (ref 26.0–34.0)
MCHC: 34.3 g/dL (ref 30.0–36.0)
MCV: 98.1 fL (ref 78.0–100.0)
MONO ABS: 0.6 10*3/uL (ref 0.1–1.0)
MONOS PCT: 6 % (ref 3–12)
Neutro Abs: 9.4 10*3/uL — ABNORMAL HIGH (ref 1.7–7.7)
Neutrophils Relative %: 89 % — ABNORMAL HIGH (ref 43–77)
Platelets: 184 10*3/uL (ref 150–400)
RBC: 4.19 MIL/uL — ABNORMAL LOW (ref 4.22–5.81)
RDW: 13.5 % (ref 11.5–15.5)
WBC: 10.6 10*3/uL — ABNORMAL HIGH (ref 4.0–10.5)

## 2013-10-17 LAB — TYPE AND SCREEN
ABO/RH(D): A POS
ANTIBODY SCREEN: NEGATIVE

## 2013-10-17 LAB — ABO/RH: ABO/RH(D): A POS

## 2013-10-18 NOTE — Progress Notes (Signed)
Anesthesia Chart Review:  Patient is a 63 year old male scheduled for revision of ileostomy and repair of peri-stomal hernia on 10/23/13 by Dr. Lindie Spruce.  History includes former smoker, Crohn's disease s/p multiple surgeries including ileostomy, GERD, abdominal hernia, appendectomy. PCP is listed as Dr. Fara Chute.  Echo on 01/23/13 [facility]) showed: Global left ventricular wall motion and contractility are within normal limits. The estimated ejection fraction is 50-55%. Normal left ventricular diastolic filling is observed. Mild mitral regurgitation. Mild tricuspid regurgitation. Inferior vena cava is mildly dilated.  Preoperative EKG, CXR, and labs noted.   Anticipate that he can proceed as planned.  Velna Ochs [facility] Short Stay Center/Anesthesiology Phone [phone] 10/18/2013 12:41 PM

## 2013-10-22 MED ORDER — DEXTROSE 5 % IV SOLN
2.0000 g | INTRAVENOUS | Status: AC
  Administered 2013-10-23: 2 g via INTRAVENOUS
  Filled 2013-10-22: qty 2

## 2013-10-23 ENCOUNTER — Encounter (HOSPITAL_COMMUNITY)
Admission: RE | Disposition: A | Discharge status: Home / Self Care | Payer: Self-pay | Source: Ambulatory Visit | Attending: General Surgery

## 2013-10-23 ENCOUNTER — Encounter (HOSPITAL_COMMUNITY): Payer: Self-pay | Admitting: *Deleted

## 2013-10-23 ENCOUNTER — Inpatient Hospital Stay (HOSPITAL_COMMUNITY): Payer: Managed Care, Other (non HMO) | Admitting: Anesthesiology

## 2013-10-23 ENCOUNTER — Encounter (HOSPITAL_COMMUNITY): Payer: Managed Care, Other (non HMO) | Admitting: Vascular Surgery

## 2013-10-23 ENCOUNTER — Inpatient Hospital Stay (HOSPITAL_COMMUNITY)
Admission: RE | Admit: 2013-10-23 | Discharge: 2013-10-27 | DRG: 330 | Disposition: A | Discharge status: Home / Self Care | Payer: Managed Care, Other (non HMO) | Source: Ambulatory Visit | Attending: General Surgery | Admitting: General Surgery

## 2013-10-23 DIAGNOSIS — K433 Parastomal hernia with obstruction, without gangrene: Secondary | ICD-10-CM | POA: Diagnosis present

## 2013-10-23 DIAGNOSIS — K460 Unspecified abdominal hernia with obstruction, without gangrene: Secondary | ICD-10-CM | POA: Diagnosis present

## 2013-10-23 DIAGNOSIS — Z87891 Personal history of nicotine dependence: Secondary | ICD-10-CM

## 2013-10-23 DIAGNOSIS — IMO0002 Reserved for concepts with insufficient information to code with codable children: Principal | ICD-10-CM | POA: Diagnosis present

## 2013-10-23 DIAGNOSIS — K435 Parastomal hernia without obstruction or  gangrene: Secondary | ICD-10-CM

## 2013-10-23 DIAGNOSIS — B3789 Other sites of candidiasis: Secondary | ICD-10-CM | POA: Diagnosis present

## 2013-10-23 HISTORY — PX: ILEOSTOMY CLOSURE: SHX1784

## 2013-10-23 SURGERY — CLOSURE, ILEOSTOMY
Anesthesia: General | Site: Abdomen

## 2013-10-23 MED ORDER — OXYCODONE HCL 5 MG PO TABS: 5.0000 mg | ORAL_TABLET | Freq: Once | ORAL | Status: DC | PRN

## 2013-10-23 MED ORDER — DEXTROSE 5 % IV SOLN
1.0000 g | Freq: Four times a day (QID) | INTRAVENOUS | Status: AC
  Administered 2013-10-23: 1 g via INTRAVENOUS
  Filled 2013-10-23: qty 1

## 2013-10-23 MED ORDER — GLYCOPYRROLATE 0.2 MG/ML IJ SOLN
INTRAMUSCULAR | Status: DC | PRN
  Administered 2013-10-23: 0.4 mg via INTRAVENOUS

## 2013-10-23 MED ORDER — HYDROMORPHONE HCL PF 1 MG/ML IJ SOLN
INTRAMUSCULAR | Status: DC | PRN
  Administered 2013-10-23 (×2): 0.5 mg via INTRAVENOUS

## 2013-10-23 MED ORDER — PREDNISONE 5 MG PO TABS
5.0000 mg | ORAL_TABLET | Freq: Every day | ORAL | Status: DC
  Administered 2013-10-24 – 2013-10-27 (×4): 5 mg via ORAL
  Filled 2013-10-23 (×5): qty 1

## 2013-10-23 MED ORDER — DIPHENHYDRAMINE HCL 50 MG/ML IJ SOLN: 12.5000 mg | Freq: Four times a day (QID) | INTRAMUSCULAR | Status: DC | PRN

## 2013-10-23 MED ORDER — HYDROMORPHONE HCL PF 1 MG/ML IJ SOLN
INTRAMUSCULAR | Status: AC
  Filled 2013-10-23: qty 1

## 2013-10-23 MED ORDER — FENTANYL CITRATE 0.05 MG/ML IJ SOLN
INTRAMUSCULAR | Status: DC | PRN
  Administered 2013-10-23: 50 ug via INTRAVENOUS
  Administered 2013-10-23: 150 ug via INTRAVENOUS
  Administered 2013-10-23 (×2): 100 ug via INTRAVENOUS
  Administered 2013-10-23 (×2): 50 ug via INTRAVENOUS

## 2013-10-23 MED ORDER — KCL IN DEXTROSE-NACL 20-5-0.45 MEQ/L-%-% IV SOLN
INTRAVENOUS | Status: DC
  Administered 2013-10-23 – 2013-10-25 (×5): via INTRAVENOUS
  Filled 2013-10-23 (×8): qty 1000

## 2013-10-23 MED ORDER — DIPHENHYDRAMINE HCL 12.5 MG/5ML PO ELIX: 12.5000 mg | ORAL_SOLUTION | Freq: Four times a day (QID) | ORAL | Status: DC | PRN

## 2013-10-23 MED ORDER — ONDANSETRON HCL 4 MG/2ML IJ SOLN: 4.0000 mg | Freq: Once | INTRAMUSCULAR | Status: DC | PRN

## 2013-10-23 MED ORDER — HYDROMORPHONE HCL PF 1 MG/ML IJ SOLN: 0.2500 mg | INTRAMUSCULAR | Status: DC | PRN

## 2013-10-23 MED ORDER — PROPOFOL 10 MG/ML IV BOLUS
INTRAVENOUS | Status: AC
  Filled 2013-10-23: qty 20

## 2013-10-23 MED ORDER — ROCURONIUM BROMIDE 100 MG/10ML IV SOLN
INTRAVENOUS | Status: DC | PRN
  Administered 2013-10-23: 50 mg via INTRAVENOUS

## 2013-10-23 MED ORDER — POVIDONE-IODINE 10 % EX OINT
TOPICAL_OINTMENT | CUTANEOUS | Status: DC | PRN
  Administered 2013-10-23: 1 via TOPICAL

## 2013-10-23 MED ORDER — PANTOPRAZOLE SODIUM 40 MG PO TBEC
40.0000 mg | DELAYED_RELEASE_TABLET | Freq: Every day | ORAL | Status: DC
  Administered 2013-10-23 – 2013-10-27 (×5): 40 mg via ORAL
  Filled 2013-10-23 (×5): qty 1

## 2013-10-23 MED ORDER — ENOXAPARIN SODIUM 40 MG/0.4ML ~~LOC~~ SOLN
40.0000 mg | SUBCUTANEOUS | Status: DC
  Administered 2013-10-24 – 2013-10-26 (×3): 40 mg via SUBCUTANEOUS
  Filled 2013-10-23 (×4): qty 0.4

## 2013-10-23 MED ORDER — AZATHIOPRINE 50 MG PO TABS
50.0000 mg | ORAL_TABLET | Freq: Two times a day (BID) | ORAL | Status: DC
  Administered 2013-10-23 – 2013-10-24 (×2): 50 mg via ORAL
  Filled 2013-10-23 (×2): qty 2
  Filled 2013-10-23: qty 1

## 2013-10-23 MED ORDER — MIDAZOLAM HCL 5 MG/5ML IJ SOLN
INTRAMUSCULAR | Status: DC | PRN
  Administered 2013-10-23: 2 mg via INTRAVENOUS

## 2013-10-23 MED ORDER — ONDANSETRON HCL 4 MG/2ML IJ SOLN
INTRAMUSCULAR | Status: DC | PRN
Start: 2013-10-23 — End: 2013-10-23
  Administered 2013-10-23: 4 mg via INTRAVENOUS

## 2013-10-23 MED ORDER — LACTATED RINGERS IV SOLN
INTRAVENOUS | Status: DC | PRN
  Administered 2013-10-23 (×2): via INTRAVENOUS

## 2013-10-23 MED ORDER — NALOXONE HCL 0.4 MG/ML IJ SOLN: 0.4000 mg | INTRAMUSCULAR | Status: DC | PRN

## 2013-10-23 MED ORDER — POVIDONE-IODINE 10 % EX OINT
TOPICAL_OINTMENT | CUTANEOUS | Status: AC
  Filled 2013-10-23: qty 28.35

## 2013-10-23 MED ORDER — OXYCODONE HCL 5 MG/5ML PO SOLN: 5.0000 mg | Freq: Once | ORAL | Status: DC | PRN

## 2013-10-23 MED ORDER — LIDOCAINE HCL (CARDIAC) 20 MG/ML IV SOLN
INTRAVENOUS | Status: DC | PRN
  Administered 2013-10-23: 80 mg via INTRAVENOUS

## 2013-10-23 MED ORDER — ALPRAZOLAM 0.5 MG PO TABS
0.5000 mg | ORAL_TABLET | Freq: Every evening | ORAL | Status: DC | PRN
  Administered 2013-10-24 – 2013-10-26 (×3): 0.5 mg via ORAL
  Filled 2013-10-23 (×3): qty 1

## 2013-10-23 MED ORDER — FENTANYL CITRATE 0.05 MG/ML IJ SOLN
INTRAMUSCULAR | Status: AC
  Filled 2013-10-23: qty 5

## 2013-10-23 MED ORDER — HYDROMORPHONE 0.3 MG/ML IV SOLN
INTRAVENOUS | Status: AC
  Filled 2013-10-23: qty 25

## 2013-10-23 MED ORDER — 0.9 % SODIUM CHLORIDE (POUR BTL) OPTIME
TOPICAL | Status: DC | PRN
  Administered 2013-10-23 (×2): 1000 mL

## 2013-10-23 MED ORDER — CHLORHEXIDINE GLUCONATE 4 % EX LIQD
1.0000 "application " | Freq: Once | CUTANEOUS | Status: DC
  Filled 2013-10-23: qty 15

## 2013-10-23 MED ORDER — PROPOFOL 10 MG/ML IV BOLUS
INTRAVENOUS | Status: DC | PRN
  Administered 2013-10-23: 200 mg via INTRAVENOUS

## 2013-10-23 MED ORDER — SODIUM CHLORIDE 0.9 % IJ SOLN: 9.0000 mL | INTRAMUSCULAR | Status: DC | PRN

## 2013-10-23 MED ORDER — NEOSTIGMINE METHYLSULFATE 10 MG/10ML IV SOLN
INTRAVENOUS | Status: DC | PRN
  Administered 2013-10-23: 3 mg via INTRAVENOUS

## 2013-10-23 MED ORDER — LACTATED RINGERS IV SOLN
INTRAVENOUS | Status: DC
  Administered 2013-10-23: 09:00:00 via INTRAVENOUS

## 2013-10-23 MED ORDER — ONDANSETRON HCL 4 MG/2ML IJ SOLN
4.0000 mg | Freq: Four times a day (QID) | INTRAMUSCULAR | Status: DC | PRN
  Administered 2013-10-24: 4 mg via INTRAVENOUS
  Filled 2013-10-23: qty 2

## 2013-10-23 MED ORDER — HYDROMORPHONE 0.3 MG/ML IV SOLN
INTRAVENOUS | Status: DC
  Administered 2013-10-23 (×2): via INTRAVENOUS
  Administered 2013-10-23: 0.9 mg via INTRAVENOUS
  Administered 2013-10-23: 2.1 mg via INTRAVENOUS
  Administered 2013-10-23: 2.4 mg via INTRAVENOUS
  Administered 2013-10-24 (×2): 0.3 mg via INTRAVENOUS
  Administered 2013-10-24: 2.1 mg via INTRAVENOUS
  Administered 2013-10-24 (×2): 0.3 mg via INTRAVENOUS
  Administered 2013-10-25: 0.9 mg via INTRAVENOUS
  Administered 2013-10-25: 1.2 mg via INTRAVENOUS
  Filled 2013-10-23: qty 25

## 2013-10-23 MED ORDER — MIDAZOLAM HCL 2 MG/2ML IJ SOLN
INTRAMUSCULAR | Status: AC
  Filled 2013-10-23: qty 2

## 2013-10-23 SURGICAL SUPPLY — 51 items
BLADE SURG ROTATE 9660 (MISCELLANEOUS) ×3 IMPLANT
CANISTER SUCTION 2500CC (MISCELLANEOUS) ×3 IMPLANT
CHLORAPREP W/TINT 10.5 ML (MISCELLANEOUS) ×2 IMPLANT
COVER SURGICAL LIGHT HANDLE (MISCELLANEOUS) ×3 IMPLANT
DRAIN SNY 7 FPER (WOUND CARE) ×3 IMPLANT
DRAPE LAPAROSCOPIC ABDOMINAL (DRAPES) ×3 IMPLANT
DRAPE UTILITY 15X26 W/TAPE STR (DRAPE) ×6 IMPLANT
DRAPE WARM FLUID 44X44 (DRAPE) ×3 IMPLANT
ELECT REM PT RETURN 9FT ADLT (ELECTROSURGICAL) ×3
ELECTRODE REM PT RTRN 9FT ADLT (ELECTROSURGICAL) ×1 IMPLANT
EVACUATOR SILICONE 100CC (DRAIN) ×2 IMPLANT
GLOVE BIO SURGEON STRL SZ7.5 (GLOVE) ×4 IMPLANT
GLOVE BIOGEL PI IND STRL 7.5 (GLOVE) IMPLANT
GLOVE BIOGEL PI IND STRL 8 (GLOVE) ×1 IMPLANT
GLOVE BIOGEL PI INDICATOR 7.5 (GLOVE) ×2
GLOVE BIOGEL PI INDICATOR 8 (GLOVE) ×2
GLOVE ECLIPSE 6.5 STRL STRAW (GLOVE) ×4 IMPLANT
GLOVE ECLIPSE 7.5 STRL STRAW (GLOVE) ×3 IMPLANT
GOWN STRL REUS W/ TWL LRG LVL3 (GOWN DISPOSABLE) ×4 IMPLANT
GOWN STRL REUS W/TWL LRG LVL3 (GOWN DISPOSABLE) ×12
KIT BASIN OR (CUSTOM PROCEDURE TRAY) ×3 IMPLANT
KIT OSTOMY DRAINABLE 2.75 STR (WOUND CARE) ×2 IMPLANT
KIT ROOM TURNOVER OR (KITS) ×3 IMPLANT
LIGASURE IMPACT 36 18CM CVD LR (INSTRUMENTS) IMPLANT
NS IRRIG 1000ML POUR BTL (IV SOLUTION) ×6 IMPLANT
PACK GENERAL/GYN (CUSTOM PROCEDURE TRAY) ×3 IMPLANT
PAD ARMBOARD 7.5X6 YLW CONV (MISCELLANEOUS) ×3 IMPLANT
SPECIMEN JAR MEDIUM (MISCELLANEOUS) ×3 IMPLANT
SPONGE GAUZE 4X4 12PLY (GAUZE/BANDAGES/DRESSINGS) ×3 IMPLANT
SPONGE LAP 18X18 X RAY DECT (DISPOSABLE) IMPLANT
STAPLER PROXIMATE 55 BLUE (STAPLE) ×2 IMPLANT
STAPLER VISISTAT 35W (STAPLE) ×3 IMPLANT
SUCTION POOLE TIP (SUCTIONS) ×3 IMPLANT
SUT ETHILON 2 0 FS 18 (SUTURE) ×2 IMPLANT
SUT NOVA NAB DX-16 0-1 5-0 T12 (SUTURE) ×3 IMPLANT
SUT PDS AB 1 TP1 96 (SUTURE) ×2 IMPLANT
SUT SILK 2 0 (SUTURE) ×3
SUT SILK 2 0 SH (SUTURE) ×3 IMPLANT
SUT SILK 2 0 SH CR/8 (SUTURE) ×3 IMPLANT
SUT SILK 2-0 18XBRD TIE 12 (SUTURE) ×1 IMPLANT
SUT SILK 3 0 (SUTURE) ×3
SUT SILK 3 0 SH CR/8 (SUTURE) ×3 IMPLANT
SUT SILK 3-0 18XBRD TIE 12 (SUTURE) ×1 IMPLANT
SUT VIC AB 3-0 SH 18 (SUTURE) ×3 IMPLANT
SUT VIC AB 3-0 SH 27 (SUTURE) ×3
SUT VIC AB 3-0 SH 27XBRD (SUTURE) IMPLANT
TOWEL OR 17X24 6PK STRL BLUE (TOWEL DISPOSABLE) ×1 IMPLANT
TOWEL OR 17X26 10 PK STRL BLUE (TOWEL DISPOSABLE) ×3 IMPLANT
TRAY FOLEY CATH 14FRSI W/METER (CATHETERS) ×3 IMPLANT
WATER STERILE IRR 1000ML POUR (IV SOLUTION) IMPLANT
YANKAUER SUCT BULB TIP NO VENT (SUCTIONS) IMPLANT

## 2013-10-23 NOTE — H&P (View-Only) (Signed)
Patient ID: Lee Klein, male   DOB: 1951/04/17, 63 y.o.   MRN: [id number]  Chief Complaint  Patient presents with  . eval hernia    HPI Lee Klein is a 63 y.o. male.  With a large right sided parastomal ileostomy hernia  HPI Patient recently was hospitalized for a bowel obstruction likely secondary to a large parastomal hernia.  The obstruction has resolved and the patient has been able to go back to work.  He has known about the hernia for more that a year, and it has significantly enlarged in size.  They saw his previous surgeon who told the patient that more than likely he would need surgery for the hernia. Last operation was 5 years ago for a colonic stricture at the anastomosis from a colostomy reversal.   Past Medical History  Diagnosis Date  . Crohn's disease   . Hernia, abdominal     Past Surgical History  Procedure Laterality Date  . Ileostomy    . Colonoscopy    . Upper gastrointestinal endoscopy    . Appendectomy    . Colon surgery      Family History  Problem Relation Age of Onset  . Healthy Mother   . Healthy Daughter   . Healthy Son     Social History History  Substance Use Topics  . Smoking status: Former Smoker    Types: Cigars    Quit date: 05/31/2006  . Smokeless tobacco: Former Neurosurgeon  . Alcohol Use: No    Allergies  Allergen Reactions  . Ivp Dye [Iodinated Diagnostic Agents]     Joint pain  . Demerol Rash    Current Outpatient Prescriptions  Medication Sig Dispense Refill  . ALPRAZolam (XANAX) 0.5 MG tablet TAKE 1 TABLET AT BEDTIME AS NEEDED FOR SLEEP  90 tablet  0  . Ascorbic Acid (VITAMIN C) 1000 MG tablet Take 1,000 mg by mouth daily.      Marland Kitchen azaTHIOprine (IMURAN) 50 MG tablet Take 50-100 mg by mouth 2 (two) times daily. Takes 2 in am and 1 at night      . B Complex-C (SUPER B COMPLEX PO) Take 1 tablet by mouth daily.       . Calcium Carbonate-Vitamin D (CALCIUM 600 + D PO) Take 1 tablet by mouth 2 (two) times daily.       Marland Kitchen  HYDROcodone-acetaminophen (NORCO/VICODIN) 5-325 MG per tablet Take 0.5 tablets by mouth at bedtime as needed for moderate pain.      . Multiple Vitamins-Minerals (MULTIVITAMIN PO) Take 1 tablet by mouth daily.       . pantoprazole (PROTONIX) 40 MG tablet Take 1 tablet (40 mg total) by mouth daily.  90 tablet  3  . predniSONE (DELTASONE) 5 MG tablet TAKE 1 TABLET BY MOUTH DAILY  90 tablet  1  . Zinc 50 MG CAPS Take 1 capsule by mouth daily.       . cholestyramine (QUESTRAN) 4 G packet Take 1 packet by mouth 2 (two) times daily with a meal.  60 each  12   No current facility-administered medications for this visit.    Review of Systems Review of Systems  Blood pressure 126/78, pulse 69, temperature 98 F (36.7 C), height 6' (1.829 m), weight 175 lb (79.379 kg).  Physical Exam Physical Exam  Vitals reviewed. Constitutional: He is oriented to person, place, and time. He appears well-developed.  HENT:  Head: Normocephalic and atraumatic.  Somewhat Moon facies with redness from chronic steroids  Eyes: Conjunctivae and EOM are normal. Pupils are equal, round, and reactive to light.  Neck: Normal range of motion.  Cardiovascular: Normal rate, regular rhythm and normal heart sounds.   Pulmonary/Chest: Effort normal and breath sounds normal.  Abdominal: Soft. Bowel sounds are normal. There is no tenderness. There is no rigidity, no rebound, no guarding and no CVA tenderness.    Musculoskeletal: Normal range of motion.  Neurological: He is alert and oriented to person, place, and time. He has normal reflexes.  Skin: Skin is warm and dry.  Psychiatric: He has a normal mood and affect. His behavior is normal. Judgment and thought content normal.    Data Reviewed I have reviewedhis labs and previous hospital admission.  Assessment    Asymptomatic large right sided parastomal hernia with a history of obstruction     Plan    Ileostomy revision and parastomal hernia repair. The patient  would like to have the ileostomy higher on his right abdominal wall which is what I was planning anyway.        Braylea Brancato O Daeton Kluth 10/03/2013, 9:05 AM    

## 2013-10-23 NOTE — Interval H&P Note (Signed)
History and Physical Interval Note: No changes.  Patient ready for OR>   10/23/2013 11:21 AM  Lee Klein  has presented today for surgery, with the diagnosis of parastomal right ileostomy hernia   The various methods of treatment have been discussed with the patient and family. After consideration of risks, benefits and other options for treatment, the patient has consented to  Procedure(s): ILEOSTOMY TAKEDOWN repair of ileostomy hernia and revision of ileostomy  (N/A) as a surgical intervention .  The patient's history has been reviewed, patient examined, no change in status, stable for surgery.  I have reviewed the patient's chart and labs.  Questions were answered to the patient's satisfaction.     Cherylynn Ridges

## 2013-10-23 NOTE — Op Note (Signed)
OPERATIVE REPORT  DATE OF OPERATION: 10/23/2013  PATIENT:  Lee Klein  63 y.o. male  PRE-OPERATIVE DIAGNOSIS:  PARASTOMAL RIGHT ILEOSTOMY HERNIA  POST-OPERATIVE DIAGNOSIS:  PARASTOMAL RIGHT ILEOSTOMY HERNIA  PROCEDURE:  Procedure(s): ILEOSTOMY TAKEDOWN, REPAIR OF PARASTOMAL HERNIA.  SURGEON:  Surgeon(s): Lee RidgesJames O Jehan Ranganathan, MD Caleen EssexPaul S Toth III, MD  ASSISTANT: Carolynne Edouardoth, M.D,  ANESTHESIA:   general  EBL: 75 ml  BLOOD ADMINISTERED: none  DRAINS: (One) Jackson-Pratt drain(s) with closed bulb suction in the subcutaneous space in the right lower quadrant   SPECIMEN:  Source of Specimen:  resected ileostomy  COUNTS CORRECT:  YES  PROCEDURE DETAILS: The patient was taken to the operating room and placed on the table in the supine position. After an adequate general endotracheal anesthetic was administered his ileostomy was oversewn using a running 2-0 silk suture and was prepped and draped in usual sterile manner exposing her entire abdomen.  A proper timeout was performed identifying the patient and the procedure to be performed. Once this was done the area of the oval incision around the parastomal hernia associated with the ileostomy was made using a marking pen. A  #10 blade in order to make the initial incision around the area of the parastomal hernia. We dissected out the hernia contents in the parastomal hernia. Once we dissected down to the fascial edge we subsequently incised into the hernia sac and removed hernia sac. There were significant adhesions between loops of small bowel; however, we are able to reduce the small bowel that prolapsed into the hernia leaving out of the distal portion over the ileostomy Once we had all the small bowel reduced we brought out the resected end of the ileostomy approximately 4-5 cm above the previous ileostomy site. We subsequently matured in place there with 3-0 Vicryl pop-off sutures were doing so tension from the ileostomy had been relieved by taking  down adhesions internally.  Circumferentially adhesions of small bowel and other structures to the anterior abdominal wall at the hernia site were taken down using blunt dissection and also electrocautery and Metzenbaum scissors. We repaired the hernia defect using interrupted figure-of-eight stitches of #1 Novafil. This left a significant empty space in the subcutaneous tissue from the previous hernia and a 7 mm Jackson-Pratt drain was placed into the site and brought out the right low quadrant. This was secured in place using 3-0 nylon suture. Once this was done the subcutaneous tissue at the previous stoma site was reapproximated using a running 3-0 Vicryl suture. We irrigated with saline and closed the skin using stainless steel staple as mentioned previously the new ileostomy his insight was matured using interrupted 3-0 Vicryl sutures. All counts were correct including needle, sponges, and instruments.  PATIENT DISPOSITION:  PACU - hemodynamically stable.   Lee Klein 6/8/201512:45 PM

## 2013-10-23 NOTE — Transfer of Care (Signed)
Immediate Anesthesia Transfer of Care Note  Patient: Lee Klein  Procedure(s) Performed: Procedure(s): ILEOSTOMY TAKEDOWN, REPAIR OF PARASTOMAL HERNIA. (N/A)  Patient Location: PACU  Anesthesia Type:General  Level of Consciousness: awake, alert , oriented and patient cooperative  Airway & Oxygen Therapy: Patient Spontanous Breathing and Patient connected to nasal cannula oxygen  Post-op Assessment: Report given to PACU RN and Post -op Vital signs reviewed and stable  Post vital signs: Reviewed and stable  Complications: No apparent anesthesia complications

## 2013-10-23 NOTE — Anesthesia Preprocedure Evaluation (Addendum)
Anesthesia Evaluation  Patient identified by MRN, date of birth, ID band Patient awake    Reviewed: Allergy & Precautions, H&P , NPO status , Patient's Chart, lab work & pertinent test results  Airway Mallampati: I TM Distance: >3 FB Neck ROM: Full    Dental  (+) Teeth Intact, Dental Advisory Given   Pulmonary former smoker,  breath sounds clear to auscultation        Cardiovascular Rhythm:Regular Rate:Normal     Neuro/Psych    GI/Hepatic GERD-  Medicated and Controlled,[place]   Endo/Other    Renal/GU      Musculoskeletal   Abdominal   Peds  Hematology   Anesthesia Other Findings   Reproductive/Obstetrics                          Anesthesia Physical Anesthesia Plan  ASA: II  Anesthesia Plan: General   Post-op Pain Management:    Induction: Intravenous  Airway Management Planned: Oral ETT  Additional Equipment:   Intra-op Plan:   Post-operative Plan: Extubation in OR  Informed Consent: I have reviewed the patients History and Physical, chart, labs and discussed the procedure including the risks, benefits and alternatives for the proposed anesthesia with the patient or authorized representative who has indicated his/her understanding and acceptance.   Dental advisory given  Plan Discussed with: CRNA, Anesthesiologist and Surgeon  Anesthesia Plan Comments:         Anesthesia Quick Evaluation

## 2013-10-23 NOTE — Progress Notes (Signed)
Report from Maria, RN

## 2013-10-23 NOTE — Anesthesia Postprocedure Evaluation (Signed)
  Anesthesia Post-op Note  Patient: Lee Klein  Procedure(s) Performed: Procedure(s): ILEOSTOMY TAKEDOWN, REPAIR OF PARASTOMAL HERNIA. (N/A)  Patient Location: PACU  Anesthesia Type:General  Level of Consciousness: awake and alert   Airway and Oxygen Therapy: Patient Spontanous Breathing and Patient connected to nasal cannula oxygen  Post-op Pain: mild  Post-op Assessment: Post-op Vital signs reviewed  Post-op Vital Signs: Reviewed  Last Vitals:  Filed Vitals:   10/23/13 1315  BP: 148/71  Pulse: 50  Temp:   Resp: 15    Complications: No apparent anesthesia complications

## 2013-10-24 ENCOUNTER — Encounter (HOSPITAL_COMMUNITY): Payer: Self-pay | Admitting: General Surgery

## 2013-10-24 LAB — CBC
HCT: 41 % (ref 39.0–52.0)
Hemoglobin: 14 g/dL (ref 13.0–17.0)
MCH: 34 pg (ref 26.0–34.0)
MCHC: 34.1 g/dL (ref 30.0–36.0)
MCV: 99.5 fL (ref 78.0–100.0)
PLATELETS: 216 10*3/uL (ref 150–400)
RBC: 4.12 MIL/uL — AB (ref 4.22–5.81)
RDW: 13.4 % (ref 11.5–15.5)
WBC: 12.7 10*3/uL — AB (ref 4.0–10.5)

## 2013-10-24 LAB — BASIC METABOLIC PANEL
BUN: 12 mg/dL (ref 6–23)
CO2: 27 mEq/L (ref 19–32)
CREATININE: 0.84 mg/dL (ref 0.50–1.35)
Calcium: 8.6 mg/dL (ref 8.4–10.5)
Chloride: 101 mEq/L (ref 96–112)
GFR calc non Af Amer: >90 mL/min (ref >90)
Glucose, Bld: 122 mg/dL — ABNORMAL HIGH (ref 70–99)
POTASSIUM: 4.5 meq/L (ref 3.7–5.3)
SODIUM: 139 meq/L (ref 137–147)

## 2013-10-24 MED ORDER — ACETAMINOPHEN 325 MG PO TABS
650.0000 mg | ORAL_TABLET | Freq: Four times a day (QID) | ORAL | Status: DC | PRN
  Administered 2013-10-24: 650 mg via ORAL
  Filled 2013-10-24: qty 2

## 2013-10-24 MED ORDER — AZATHIOPRINE 50 MG PO TABS
100.0000 mg | ORAL_TABLET | Freq: Every day | ORAL | Status: DC
  Administered 2013-10-25 – 2013-10-27 (×3): 100 mg via ORAL
  Filled 2013-10-24 (×3): qty 2

## 2013-10-24 MED ORDER — AZATHIOPRINE 50 MG PO TABS
50.0000 mg | ORAL_TABLET | Freq: Every day | ORAL | Status: DC
  Administered 2013-10-25 – 2013-10-26 (×2): 50 mg via ORAL
  Filled 2013-10-24 (×3): qty 1

## 2013-10-24 NOTE — Progress Notes (Signed)
GS Progress Note Subjective: Patient is doing okay.  Has a headache.  Dressing getting contaminated because of the configuration.  Objective: Vital signs in last 24 hours: Temp:  [97.5 F (36.4 C)-98.3 F (36.8 C)] 98 F (36.7 C) (06/09 0549) Pulse Rate:  [50-94] 66 (06/09 0549) Resp:  [7-20] 15 (06/09 0549) BP: (113-148)/(60-74) 113/67 mmHg (06/09 0549) SpO2:  [94 %-100 %] 94 % (06/09 0549) Weight:  [78.926 kg (174 lb)] 78.926 kg (174 lb) (06/08 0833)    Intake/Output from previous day: 06/08 0701 - 06/09 0700 In: 3598 [I.V.:3598] Out: 790 [Urine:705; Drains:45; Blood:40] Intake/Output this shift:    Lungs: Clear  Abd: Soft, good bowel sounds.  Ileostomy is functioning well.  Extremities: No clinical signs or symptoms of DVT  Neuro: Intact  Lab Results: CBC   Recent Labs  10/24/13 0350  WBC 12.7*  HGB 14.0  HCT 41.0  PLT 216   BMET  Recent Labs  10/24/13 0350  NA 139  K 4.5  CL 101  CO2 27  GLUCOSE 122*  BUN 12  CREATININE 0.84  CALCIUM 8.6   PT/INR No results found for this basename: LABPROT, INR,  in the last 72 hours ABG No results found for this basename: PHART, PCO2, PO2, HCO3,  in the last 72 hours  Studies/Results: No results found.  Anti-infectives: Anti-infectives   Start     Dose/Rate Route Frequency Ordered Stop   10/23/13 1800  cefOXitin (MEFOXIN) 1 g in dextrose 5 % 50 mL IVPB     1 g 100 mL/hr over 30 Minutes Intravenous 4 times per day 10/23/13 1457 10/23/13 1737   10/23/13 0600  cefOXitin (MEFOXIN) 2 g in dextrose 5 % 50 mL IVPB     2 g 100 mL/hr over 30 Minutes Intravenous On call to O.R. 10/22/13 1402 10/23/13 1128      Assessment/Plan: s/p Procedure(s): ILEOSTOMY TAKEDOWN, REPAIR OF PARASTOMAL HERNIA. Advance diet Ostomy nurse consultation Decrease IVFs  LOS: 1 day    Marta LamasJames O. Gae BonWyatt, III, MD, FACS [phone]--pager [phone]--office [facility] 10/24/2013

## 2013-10-24 NOTE — Consult Note (Signed)
WOC ostomy consult note Stoma type/location:  RLQ, end ileostomy Stomal assessment/size: 1 5/8" x 1 3/4" oval stoma, flush with the skin, pink, moist Peristomal assessment: intact Treatment options for stomal/peristomal skin: NA Output beginning to have some liquid output Ostomy pouching: 2pc. 2 1/4" with 2" barrier ring due to flush stoma.  Education provided:  Pt has had stoma for many years including both colostomy and ileostomy. Now with new site after hernia repair, close proximity to closed takedown site.  Instruction given on off centering the opening to avoid the incision.   Dry dressing placed over the incision and drain site.  WOC will follow along with you for ostomy care and support Iveth Heidemann [place] RN,CWOCN [phone]

## 2013-10-25 LAB — CBC
HCT: 39.2 % (ref 39.0–52.0)
HEMOGLOBIN: 13.4 g/dL (ref 13.0–17.0)
MCH: 34.5 pg — ABNORMAL HIGH (ref 26.0–34.0)
MCHC: 34.2 g/dL (ref 30.0–36.0)
MCV: 101 fL — AB (ref 78.0–100.0)
Platelets: 184 10*3/uL (ref 150–400)
RBC: 3.88 MIL/uL — AB (ref 4.22–5.81)
RDW: 13.2 % (ref 11.5–15.5)
WBC: 10.3 10*3/uL (ref 4.0–10.5)

## 2013-10-25 LAB — BASIC METABOLIC PANEL
BUN: 6 mg/dL (ref 6–23)
CO2: 27 meq/L (ref 19–32)
CREATININE: 0.71 mg/dL (ref 0.50–1.35)
Calcium: 8.7 mg/dL (ref 8.4–10.5)
Chloride: 106 mEq/L (ref 96–112)
GFR calc non Af Amer: >90 mL/min (ref >90)
GLUCOSE: 116 mg/dL — AB (ref 70–99)
POTASSIUM: 4.1 meq/L (ref 3.7–5.3)
Sodium: 142 mEq/L (ref 137–147)

## 2013-10-25 MED ORDER — OXYCODONE HCL 5 MG PO TABS
10.0000 mg | ORAL_TABLET | ORAL | Status: DC | PRN
  Administered 2013-10-25: 10 mg via ORAL
  Administered 2013-10-25 – 2013-10-26 (×2): 15 mg via ORAL
  Filled 2013-10-25: qty 3
  Filled 2013-10-25: qty 2
  Filled 2013-10-25: qty 3

## 2013-10-25 MED ORDER — NYSTATIN 100000 UNIT/GM EX POWD
Freq: Two times a day (BID) | CUTANEOUS | Status: DC
  Administered 2013-10-25 – 2013-10-27 (×4): via TOPICAL
  Filled 2013-10-25: qty 15

## 2013-10-25 MED ORDER — HYDROMORPHONE HCL PF 1 MG/ML IJ SOLN
0.5000 mg | INTRAMUSCULAR | Status: DC | PRN
  Administered 2013-10-25 – 2013-10-26 (×2): 1 mg via INTRAVENOUS
  Filled 2013-10-25 (×3): qty 1

## 2013-10-25 NOTE — Consult Note (Signed)
WOC ostomy follow up Stoma type/location: RLQ, end ileostomy Output liquid green effluent, wife and staff emptying  Ostomy pouching: 2pc.2 1/4" in place, doing well since changed and not covering the incision any longer  Will provide patient and family with ostomy support group information per their request.  WOC will follow along with you for ostomy care and support. Estanislado Surgeon [place] RN,CWOCN [phone]

## 2013-10-25 NOTE — Progress Notes (Signed)
GS Progress Note Subjective: Patient sitting up in chair, looking well.  No fever.  Wants to eat more.  Objective: Vital signs in last 24 hours: Temp:  [98 F (36.7 C)-99.7 F (37.6 C)] 98.4 F (36.9 C) (06/10 [id number]) Pulse Rate:  [66-79] 78 (06/10 0637) Resp:  [16-22] 17 (06/10 0818) BP: (127-135)/(60-75) 135/66 mmHg (06/10 0637) SpO2:  [91 %-97 %] 96 % (06/10 0818) Weight:  [78.926 kg (174 lb)] 78.926 kg (174 lb) (06/09 2134) Last BM Date:  (prior to admission)  Intake/Output from previous day: 06/09 0701 - 06/10 0700 In: 120 [P.O.:120] Out: 2580 [Urine:1750; Drains:30; Stool:800] Intake/Output this shift:    Lungs: Clear to auscultation  Abd: Soft, ostomy is working well., incision is slightly reddened, drain output is serosanguinous.  Extremities: No clinical signs or symptoms of DVT  Neuro: Intact  Lab Results: CBC   Recent Labs  10/24/13 0350 10/25/13 0456  WBC 12.7* 10.3  HGB 14.0 13.4  HCT 41.0 39.2  PLT 216 184   BMET  Recent Labs  10/24/13 0350 10/25/13 0456  NA 139 142  K 4.5 4.1  CL 101 106  CO2 27 27  GLUCOSE 122* 116*  BUN 12 6  CREATININE 0.84 0.71  CALCIUM 8.6 8.7   PT/INR No results found for this basename: LABPROT, INR,  in the last 72 hours ABG No results found for this basename: PHART, PCO2, PO2, HCO3,  in the last 72 hours  Studies/Results: No results found.  Anti-infectives: Anti-infectives   Start     Dose/Rate Route Frequency Ordered Stop   10/23/13 1800  cefOXitin (MEFOXIN) 1 g in dextrose 5 % 50 mL IVPB     1 g 100 mL/hr over 30 Minutes Intravenous 4 times per day 10/23/13 1457 10/23/13 1737   10/23/13 0600  cefOXitin (MEFOXIN) 2 g in dextrose 5 % 50 mL IVPB     2 g 100 mL/hr over 30 Minutes Intravenous On call to O.R. 10/22/13 1402 10/23/13 1128      Assessment/Plan: s/p Procedure(s): ILEOSTOMY TAKEDOWN, REPAIR OF PARASTOMAL HERNIA. Advance diet DC PCA Decrease IVFs. Observe wound.  LOS: 2 days    Marta LamasJames  O. Gae BonWyatt, III, MD, FACS [phone]--pager [phone]--office [facility] 10/25/2013

## 2013-10-26 MED ORDER — HYDROCODONE-ACETAMINOPHEN 5-325 MG PO TABS
1.0000 | ORAL_TABLET | ORAL | Status: DC | PRN
  Administered 2013-10-26 – 2013-10-27 (×4): 2 via ORAL
  Filled 2013-10-26 (×4): qty 2

## 2013-10-26 NOTE — Progress Notes (Signed)
GS Progress Note Subjective: Patient is doing better.  Ostomy output is good.  Slightly bloated.  Objective: Vital signs in last 24 hours: Temp:  [97.7 F (36.5 C)-98.4 F (36.9 C)] 97.7 F (36.5 C) (06/11 0551) Pulse Rate:  [60-64] 60 (06/11 0551) Resp:  [17-18] 17 (06/11 0551) BP: (113-122)/(60-66) 115/60 mmHg (06/11 0551) SpO2:  [92 %-96 %] 92 % (06/11 0551) Last BM Date: 10/25/13  Intake/Output from previous day: 06/10 0701 - 06/11 0700 In: 420 [P.O.:120; I.V.:300] Out: 2255 [Urine:1650; Drains:30; Stool:575] Intake/Output this shift:    Lungs: Clear  Abd: Slightly distended, good bowel sounds.  Ostomy viable and functional  Extremities: No clinical signs or symptoms of DVT  Neuro: Intact  Lab Results: CBC   Recent Labs  10/24/13 0350 10/25/13 0456  WBC 12.7* 10.3  HGB 14.0 13.4  HCT 41.0 39.2  PLT 216 184   BMET  Recent Labs  10/24/13 0350 10/25/13 0456  NA 139 142  K 4.5 4.1  CL 101 106  CO2 27 27  GLUCOSE 122* 116*  BUN 12 6  CREATININE 0.84 0.71  CALCIUM 8.6 8.7   PT/INR No results found for this basename: LABPROT, INR,  in the last 72 hours ABG No results found for this basename: PHART, PCO2, PO2, HCO3,  in the last 72 hours  Studies/Results: No results found.  Anti-infectives: Anti-infectives   Start     Dose/Rate Route Frequency Ordered Stop   10/23/13 1800  cefOXitin (MEFOXIN) 1 g in dextrose 5 % 50 mL IVPB     1 g 100 mL/hr over 30 Minutes Intravenous 4 times per day 10/23/13 1457 10/23/13 1737   10/23/13 0600  cefOXitin (MEFOXIN) 2 g in dextrose 5 % 50 mL IVPB     2 g 100 mL/hr over 30 Minutes Intravenous On call to O.R. 10/22/13 1402 10/23/13 1128      Assessment/Plan: s/p Procedure(s): ILEOSTOMY TAKEDOWN, REPAIR OF PARASTOMAL HERNIA. Advance diet Plan for discharge tomorrow Probably discontinue drain tomorrow.  LOS: 3 days    Marta Lamas. Gae Bon, MD, FACS [phone] [facility] 10/26/2013

## 2013-10-26 NOTE — Consult Note (Signed)
WOC ostomy follow up Stoma type/location: RLQ, end ileostomy Stomal assessment/size: see previous notes Peristomal assessment: intact, some mild irritation from the tape.  Treatment options for stomal/peristomal skin: add skin barrier wipes with next pouch change  Output pasty, green  Ostomy pouching: 2pc. 2 1/4" used with 2" barrier ring. Off centered opening to avoid the incision line, however I did not get it as high as before, will repouch for that reason tom. Am. Education provided: explained true rationale for barrier wipes.  Provided my contact information for dc.  Samples of osteo secrets underwear and swim wear shown to patient, since this is the one thing he will not do (swim) with ostomy.  Provided catalog for products. Will send samples of new ostomy scissors per his request through [employer].  [place] ostomy support group information provided per patient and wife's request as that is the support group I currently work with.    Enrolled [facility] in [facility] [employer] Discharge program: Esmond CamperYes  Jossilyn Benda RN,CWOCN [phone]

## 2013-10-27 MED ORDER — HYDROCODONE-ACETAMINOPHEN 5-325 MG PO TABS: 1.0000 | ORAL_TABLET | ORAL | Status: DC | PRN

## 2013-10-27 NOTE — Discharge Instructions (Signed)
Ileostomy Surgery An ileostomy surgery is a procedure that redirects a section of the small intestine to an external opening in the abdomen or to a newly created internal pouch. The external opening is called a stoma or ostomy. The opening may include a valve. External pouches are attached to the stoma to collect waste since the waste can no longer travel through the rest of the bowel. Where the stoma is located and what it looks like depends on the type of ileostomy performed. An external opening with a valve is emptied with a catheter. An ileostomy may be temporary or permanent. The hospital stay after this procedure is typically 3 to 7 days.  LET YOUR CAREGIVER KNOW ABOUT:  Allergies to food or medicine.  Medicines taken, including vitamins, health supplements, herbs, eyedrops, over-the-counter medicines, and creams.  Use of steroids (by mouth or creams).  Previous problems with anesthetics or numbing medicines.  History of bleeding problems or blood clots.  Previous surgery.  Other health problems, including diabetes and kidney problems.  Possibility of pregnancy, if this applies. RISKS AND COMPLICATIONS General surgical complications may include:  Reaction to anesthesia.  Damage to surrounding nerves, tissues, or structures.  Infection.  Blood clot.  Bleeding.  Scarring. Specific risks for ileostomy, while rare, may include:  Difficulty absorbing nutrients or vitamins from foods.  Large volume of drainage from the ileostomy.  Dehydration.  Intestinal blockage.  Skin irritation around the stoma.  Poor wound healing.  Narrowing or collapsing of the stoma.  Hernia.  Needing to repeat the procedure. BEFORE THE PROCEDURE It is important to follow your surgeon's instructions prior to your procedure to avoid complications. Steps before your procedure may include:  A physical exam, blood and stool tests, rectal exam, X-rays, colonoscopy, and other procedures.  A  review of the procedure, the anesthesia being used, and what to expect after the procedure. You may meet with an ostomy advisor. You may be asked to:  Stop taking certain medicines for several days or weeks prior to your procedure, such as blood thinners (including aspirin).  Take certain medicines, such as antibiotics or stool softeners.  Avoid eating and drinking after midnight the night before the procedure. This will help you to avoid complications from the anesthesia.  Quit smoking. Smoking increases the chances of a healing problem after your procedure. PROCEDURE There are several types of ileostomy procedures. You will be given medicine that makes you sleep (general anesthetic).The procedure may be done as open surgery, with a large cut (incision), or as laparoscopic surgery, with several small incisions. The 3 main procedure types are:  Loop ileostomy. The surgeon pulls a piece of small intestine out toward the abdomen, cuts it, which causes it to open into 2 openings. This procedure is often temporary. It is often performed as part of another procedure. Once the areas of the small intestine have rested or healed, the intestine may be reconnected.  End ileostomy. The surgeon pulls the cut end of small intestine out toward the abdomen and sews it to your skin. This can be temporary or permanent. It is often performed as part of another procedure.  Continent ileostomy. The surgeon creates an internal pouch from part of the small intestine. The pouch stays inside your body and connects to your stoma through a valve your surgeon creates. Waste is emptied through a catheter. AFTER THE PROCEDURE  You will be given pain medicine.  You may be able to suck on ice. You may begin drinking clear  fluids as early as the next day. You may begin a normal diet as early as 2 days after surgery, or as directed by your surgeon.  Your stoma will be covered with bandages or a pouch.  Initial drainage  from the stoma or rectum will be liquid.  The stoma may be dark-colored, swollen, and bruised until it has more time to heal.  Arrange for someone to help you with activities at home while you recover. Document Released: 12/15/2010 Document Revised: 07/27/2011 Document Reviewed: 12/15/2010 [facility] Patient Information 2014 [place] [place].

## 2013-10-27 NOTE — Discharge Summary (Signed)
Physician Discharge Summary  Patient ID: Lee Klein MRN: [id number] DOB/AGE: [birth year] 63 y.o.  Admit date: 10/23/2013 Discharge date: 10/27/2013  Admission Diagnoses:  Discharge Diagnoses:  Active Problems:   Parastomal hernia with obstruction and without gangrene   Discharged Condition: good  Hospital Course: Patient admitted directly after parastomal hernia repair and revision of ileostomy.  Did well postoperatively.  Drain in place discontinued prior to discharge.  Consults: Wound care and ostomy nurse team  Significant Diagnostic Studies: labs: cbc and Bmet  Treatments: IV hydration, antibiotics: cefoxitin and surgery: As mentioned above  Discharge Exam: Blood pressure 114/68, pulse 74, temperature 97.5 F (36.4 C), temperature source Oral, resp. rate 20, height 6' (1.829 m), weight 78.926 kg (174 lb), SpO2 94.00%. General appearance: alert, cooperative, appears stated age and no distress Resp: clear to auscultation bilaterally GI: soft, non-tender; bowel sounds normal; no masses,  no organomegaly and Wound does not appear to be infected.  Disposition: 01-Home or Self Care      Discharge Instructions   Call MD for:  difficulty breathing, headache or visual disturbances    Complete by:  As directed      Call MD for:  extreme fatigue    Complete by:  As directed      Call MD for:  hives    Complete by:  As directed      Call MD for:  persistant dizziness or light-headedness    Complete by:  As directed      Call MD for:  persistant nausea and vomiting    Complete by:  As directed      Call MD for:  redness, tenderness, or signs of infection (pain, swelling, redness, odor or green/yellow discharge around incision site)    Complete by:  As directed      Call MD for:  severe uncontrolled pain    Complete by:  As directed      Call MD for:  temperature >100.4    Complete by:  As directed      Change dressing (specify)    Complete by:  As directed   Dressing  change: once times per day using steril 4x4 gauze.  Clean and cover.     Diet - low sodium heart healthy    Complete by:  As directed      Diet general    Complete by:  As directed      Discharge instructions    Complete by:  As directed   You had an ileostomy revision and hernia repair.  No lifting > 25 pounds for the next 6 weeks.  Walk as tolerated.  May shower and pat wound dry.     Driving Restrictions    Complete by:  As directed   For one week     Increase activity slowly    Complete by:  As directed      Lifting restrictions    Complete by:  As directed   Nothing > 25 pounds for 6 weeks     Sexual Activity Restrictions    Complete by:  As directed   Until pain controlled.            Medication List         ALPRAZolam 0.5 MG tablet  Commonly known as:  XANAX  Take 0.5 mg by mouth at bedtime as needed for anxiety.     azaTHIOprine 50 MG tablet  Commonly known as:  IMURAN  Take 50-100 mg by mouth  2 (two) times daily. Takes 2 tabs in am and 1 tab at night     CALCIUM 600 + D PO  Take 1 tablet by mouth 2 (two) times daily.     HYDROcodone-acetaminophen 5-325 MG per tablet  Commonly known as:  NORCO/VICODIN  Take 1-2 tablets by mouth every 4 (four) hours as needed for moderate pain.     MULTIVITAMIN PO  Take 1 tablet by mouth daily.     pantoprazole 40 MG tablet  Commonly known as:  PROTONIX  Take 40 mg by mouth daily.     predniSONE 5 MG tablet  Commonly known as:  DELTASONE  Take 5 mg by mouth daily with breakfast.     SUPER B COMPLEX PO  Take 1 tablet by mouth daily.     vitamin C 1000 MG tablet  Take 1,000 mg by mouth daily.     Zinc 50 MG Caps  Take 1 capsule by mouth daily.       Follow-up Information   Follow up with Brennan BaileyMichelle Brooks, CMA. Call on 11/02/2013. (For suture removal,appointment with Marcelino DusterMichelle at 1030)    Specialty:  General Surgery   Contact information:   [address] [place] [address]       Follow up  with Cherylynn RidgesWYATT, Kyndra Condron O, MD. Schedule an appointment as soon as possible for a visit in 2 weeks.   Specialty:  General Surgery   Contact information:   [address]  [facility] [place] [address] [phone]       Signed: Cherylynn RidgesWYATT, Jahaan Vanwagner O 10/27/2013, 9:49 AM

## 2013-10-27 NOTE — Consult Note (Signed)
WOC ostomy follow up Seen today to replace ostomy pouch in order to keep wafer off the incision, pending DC to home today.  Stoma Location: RLQ, end ileostomy  Stomal assessment/size: see previous notes  Peristomal assessment: intact Treatment options for stomal/peristomal skin: used skin barrier wipes today to protect the skin from adhesive dermatitis.   Output pasty, green  Ostomy pouching: 2pc. 2 1/4" used with 2" barrier ring. Off centered opening to avoid the incision line  Education provided: discussed use of beige pouches with patient for more privacy.  Enrolled patient in [place] [employer] Discharge program: Yes will send samples of beige two pc for patient to try.   Noted by has increasing candida in the bilateral groin areas lower near the scrotum.  I will provide with samples of interdry Ag+ to see if this will clear this area quickly for patient. Explained use of product to the wife.   Gjon Letarte [place] RN,CWOCN [phone]

## 2013-11-02 ENCOUNTER — Encounter (INDEPENDENT_AMBULATORY_CARE_PROVIDER_SITE_OTHER): Payer: Self-pay | Admitting: General Surgery

## 2013-11-02 ENCOUNTER — Ambulatory Visit (INDEPENDENT_AMBULATORY_CARE_PROVIDER_SITE_OTHER): Payer: Managed Care, Other (non HMO) | Admitting: General Surgery

## 2013-11-02 VITALS — Temp 98.6°F

## 2013-11-02 DIAGNOSIS — Z4802 Encounter for removal of sutures: Secondary | ICD-10-CM

## 2013-11-02 HISTORY — PX: PARASTOMAL HERNIA REPAIR: SHX2162

## 2013-11-02 NOTE — Patient Instructions (Signed)
Patient came in today to have his staple removed from surgery. The surgery site looked good I placed steri streps over the surgery site. The patient stated that they went out to last for dinner and he could not stay out long due he started to hurt, and the wife was with him and told him that he could take some pain me. She stated that he did not like taking any pain med. I told him that if he is hurting he can take some pain meds. I asked if he was drinking a lot of fluid and the wife stated no. I told him that he needed to drink a lot of fluid due to the hot weather that we are having and taking pain med. I also told him that he need to eat protein that it will help him not to feel so weak. I call Marcelino Duster and made him an apt to come back next week to see Dr Lindie Spruce

## 2013-11-07 ENCOUNTER — Ambulatory Visit (INDEPENDENT_AMBULATORY_CARE_PROVIDER_SITE_OTHER): Payer: Managed Care, Other (non HMO) | Admitting: General Surgery

## 2013-11-07 ENCOUNTER — Telehealth (INDEPENDENT_AMBULATORY_CARE_PROVIDER_SITE_OTHER): Payer: Self-pay

## 2013-11-07 ENCOUNTER — Encounter (INDEPENDENT_AMBULATORY_CARE_PROVIDER_SITE_OTHER): Payer: Self-pay | Admitting: General Surgery

## 2013-11-07 VITALS — BP 126/78 | HR 73 | Temp 99.0°F | Ht 72.0 in | Wt 168.0 lb

## 2013-11-07 DIAGNOSIS — IMO0001 Reserved for inherently not codable concepts without codable children: Secondary | ICD-10-CM

## 2013-11-07 DIAGNOSIS — Z5189 Encounter for other specified aftercare: Secondary | ICD-10-CM

## 2013-11-07 DIAGNOSIS — T814XXA Infection following a procedure, initial encounter: Secondary | ICD-10-CM

## 2013-11-07 DIAGNOSIS — T814XXD Infection following a procedure, subsequent encounter: Principal | ICD-10-CM

## 2013-11-07 DIAGNOSIS — T8140XA Infection following a procedure, unspecified, initial encounter: Secondary | ICD-10-CM

## 2013-11-07 DIAGNOSIS — T8149XA Infection following a procedure, other surgical site, initial encounter: Secondary | ICD-10-CM | POA: Insufficient documentation

## 2013-11-07 MED ORDER — AMOXICILLIN-POT CLAVULANATE 875-125 MG PO TABS: 1.0000 | ORAL_TABLET | Freq: Two times a day (BID) | ORAL | Status: DC

## 2013-11-07 NOTE — Progress Notes (Signed)
Subjective:     Patient ID: Lee Klein, male   DOB: [birth year], 63 y.o.   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 He has a wound infection.  Review of Systems Development of redness around the incision from the hernia repair.    Objective:   Physical Exam The patient of the bulging in the central portion of his wound which is read but not draining. This likely represents a incisional and/or wound infection.  With a Betadine prep and with local anesthetic the wound is incised with a 15 blade. Only a few cc of purulent drainage were drained and a culture was sent. The wound was subsequently packed with approximately 8-9 inches of quarter inch autophobia gauze. He is to return to see me on July 7 but will return to see the nurse in 2 days for wound check and repacking of the wound. That time we will make an assessment as to whether or not home health services will be necessary.    Assessment:     Postoperative wound infection.     Plan:     Amoxicillin for his wound infection or Augmentin. Recheck of his wound and have the patient come back to see me on July 7.

## 2013-11-07 NOTE — Telephone Encounter (Signed)
Pt's wife called stating that pt's ileostomy bag had saturated the 4x4 and the wound packing. Spoke with Dr Derrell Lolling, advised pt to shower and wash area with soap and water. Place 4x4's back over the wound. Pt has appt to come back in on 11/11/10 to recheck the wound. Pt verbalized understanding.

## 2013-11-10 ENCOUNTER — Encounter (INDEPENDENT_AMBULATORY_CARE_PROVIDER_SITE_OTHER): Payer: Self-pay | Admitting: General Surgery

## 2013-11-10 ENCOUNTER — Ambulatory Visit (INDEPENDENT_AMBULATORY_CARE_PROVIDER_SITE_OTHER): Payer: Managed Care, Other (non HMO) | Admitting: General Surgery

## 2013-11-10 VITALS — BP 128/82 | HR 76 | Temp 98.1°F | Resp 18 | Ht 72.0 in | Wt 168.8 lb

## 2013-11-10 DIAGNOSIS — T8140XA Infection following a procedure, unspecified, initial encounter: Secondary | ICD-10-CM

## 2013-11-10 DIAGNOSIS — T814XXD Infection following a procedure, subsequent encounter: Principal | ICD-10-CM

## 2013-11-10 DIAGNOSIS — Z5189 Encounter for other specified aftercare: Secondary | ICD-10-CM

## 2013-11-10 DIAGNOSIS — IMO0001 Reserved for inherently not codable concepts without codable children: Secondary | ICD-10-CM

## 2013-11-10 MED ORDER — HYDROCODONE-ACETAMINOPHEN 5-325 MG PO TABS: 1.0000 | ORAL_TABLET | ORAL | Status: DC | PRN

## 2013-11-10 MED ORDER — AMOXICILLIN-POT CLAVULANATE 875-125 MG PO TABS: 1.0000 | ORAL_TABLET | Freq: Two times a day (BID) | ORAL | Status: AC

## 2013-11-10 NOTE — Progress Notes (Signed)
Subjective:     Patient ID: Lee Klein, male   DOB: December 07, 1950, 63 y.o.   MRN: [id number]  HPI The patient is going much better status post incision and drainage of incisional infection/abscess.  Review of Systems No fevers or chills. Feeling much better.    Objective:   Physical Exam The incisional erythema has improved significantly. I removed the previous packing and they'll is a small amount of purulent exudate coming from the wound. I cannot express any significant deeper pockets of infected fluid.  I repacked the wound with approximately 8 inches of quarter-inch iodoform Nu Gauze. I instructed the family to continue to keep the packing in until Sunday at which time it can be removed.    Assessment:     Infection status post repair of parastomal hernia relocation of ileostomy.  Klebsiella is the organism growing from the wound which is sensitive to Augmentin.  Significant improvement from last clinic visit.     Plan:     The packing should be removed on Sunday. At that time the patient can shower and wash her wound clean and subsequently packed it with wet to dry and a sterile saline soaked 4 x 4 gauze. I will see the patient back in clinic on 11/21/2013.

## 2013-11-11 LAB — WOUND CULTURE
GRAM STAIN: NONE SEEN
Gram Stain: NONE SEEN

## 2013-11-21 ENCOUNTER — Ambulatory Visit (INDEPENDENT_AMBULATORY_CARE_PROVIDER_SITE_OTHER): Payer: Managed Care, Other (non HMO) | Admitting: General Surgery

## 2013-11-21 ENCOUNTER — Encounter (INDEPENDENT_AMBULATORY_CARE_PROVIDER_SITE_OTHER): Payer: Self-pay | Admitting: General Surgery

## 2013-11-21 VITALS — BP 132/82 | HR 70 | Resp 14 | Ht 72.0 in | Wt 168.2 lb

## 2013-11-21 DIAGNOSIS — Z09 Encounter for follow-up examination after completed treatment for conditions other than malignant neoplasm: Secondary | ICD-10-CM | POA: Insufficient documentation

## 2013-11-21 NOTE — Progress Notes (Signed)
Subjective:     Patient ID: Lee Klein, male   DOB: Dec 24, 1950, 63 y.o.   MRN: [id number]  HPI The patient is doing well. His main complaint is discomfort at the upper part of his stoma.  Review of Systems Discomfort at the upper part of the stoma.    Objective:   Physical Exam Site of previous wound infection is healed. I covered it with Triple Antibiotic ointment and 4 x 4 gauze.    Assessment:     Wound infection essentially healed. Discomfort at the upper part of the stoma is not really concerning. The patient and his wife feels it was related to the sutures in that area. He has not prolapse and does not have a hernia.     Plan:      The patient does not need to come back and see me for the current state of his wound. I advised him that after 3 months total from the time of his surgery if he still having the discomfort in the upper portion of the stoma he can make an appointment to see me. Otherwise he needs to see me on a when necessary basis.

## 2013-11-30 ENCOUNTER — Telehealth (INDEPENDENT_AMBULATORY_CARE_PROVIDER_SITE_OTHER): Payer: Self-pay | Admitting: *Deleted

## 2013-11-30 ENCOUNTER — Telehealth (INDEPENDENT_AMBULATORY_CARE_PROVIDER_SITE_OTHER): Payer: Self-pay

## 2013-11-30 ENCOUNTER — Other Ambulatory Visit (INDEPENDENT_AMBULATORY_CARE_PROVIDER_SITE_OTHER): Payer: Self-pay | Admitting: *Deleted

## 2013-11-30 NOTE — Telephone Encounter (Signed)
Patient called , request refills on his Hydrocodone and Xanax. Hydrocodone was last filled on 11/07/13. Xanax - I did not see a last refill date.

## 2013-11-30 NOTE — Telephone Encounter (Signed)
Refill request sent. to Dr.Rehman 

## 2013-11-30 NOTE — Telephone Encounter (Signed)
Needs his Hydrocodone and ALPRAZolam refilled. The return phone number is [phone].

## 2013-11-30 NOTE — Telephone Encounter (Signed)
Pt calling in b/c he is getting calls from his disability comp. Stating that he needs to return to work on 12/04/13 even though we wrote him out until 12/17/13 for dis.papers/FMLA. I looked back over the office notes of Dr Dixon Boos and the pt's disability forms. I explained to the pt that in Dr Dixon Boos note dated 11/21/13 he was released from Dr Lindie Spruce on a prn status now b/c he is doing well from surgery. I explained to pt that normally pt's are out of work approx.4-6 weeks from the surgery date and we had marked him out for 8 weeks. I explained that it really will come down to his disability company [employer] stating that he needs to RTW on 12/04/13 if he wants to keep his job and get paid b/c there is not any documentation to back up for any reason to keep him out any longer since Dr Lindie Spruce released him in the last note. I explained if pt's are having wound complications after surgery then there is documentation from the doctor that could back up the reasons for keeping pt's out longer. I explained to pt that we can give a RTW date but the insurance company can say that they feel you should return to work sooner sometimes or cut the pay off. I advised pt that his FMLA is dated for RTW on 12/17/13 but that is just to protect his place at employment that will not get him any paychecks since disability is stating that he needs to RTW on 12/04/2013. Pt understands.

## 2013-12-04 ENCOUNTER — Other Ambulatory Visit (INDEPENDENT_AMBULATORY_CARE_PROVIDER_SITE_OTHER): Payer: Self-pay | Admitting: Internal Medicine

## 2013-12-04 MED ORDER — HYDROCODONE-ACETAMINOPHEN 5-325 MG PO TABS: 1.0000 | ORAL_TABLET | ORAL | Status: DC | PRN

## 2013-12-04 MED ORDER — ALPRAZOLAM 0.5 MG PO TABS: 0.5000 mg | ORAL_TABLET | Freq: Every evening | ORAL | Status: DC | PRN

## 2013-12-04 NOTE — Telephone Encounter (Signed)
This has been addressed.

## 2013-12-12 ENCOUNTER — Ambulatory Visit (INDEPENDENT_AMBULATORY_CARE_PROVIDER_SITE_OTHER): Payer: Managed Care, Other (non HMO) | Admitting: Internal Medicine

## 2013-12-19 ENCOUNTER — Ambulatory Visit (INDEPENDENT_AMBULATORY_CARE_PROVIDER_SITE_OTHER): Payer: Medicare HMO | Admitting: Internal Medicine

## 2014-01-08 ENCOUNTER — Encounter (INDEPENDENT_AMBULATORY_CARE_PROVIDER_SITE_OTHER): Payer: Self-pay

## 2014-01-18 ENCOUNTER — Telehealth (INDEPENDENT_AMBULATORY_CARE_PROVIDER_SITE_OTHER): Payer: Self-pay | Admitting: *Deleted

## 2014-01-18 NOTE — Telephone Encounter (Signed)
Will wait for x-ray reports before patient contacted.

## 2014-01-18 NOTE — Telephone Encounter (Signed)
Dr. Neita Carp has increase his prednisone to 60 mg for five days, 40 md for five days then 20 mgs for his left wrist. He was told that this may be coming from his Chrohn's Disease. Wanted to make Dr. Karilyn Cota aware of what was gong on. Patient will call Dr. Dian Situ office and have them fax a copy of office note with labs.

## 2014-02-02 ENCOUNTER — Other Ambulatory Visit (INDEPENDENT_AMBULATORY_CARE_PROVIDER_SITE_OTHER): Payer: Self-pay | Admitting: Internal Medicine

## 2014-03-08 NOTE — Telephone Encounter (Signed)
Called patient and asked him to please have his PCP fax the information that we have not received it at this time. Voices understood

## 2014-03-13 ENCOUNTER — Ambulatory Visit (INDEPENDENT_AMBULATORY_CARE_PROVIDER_SITE_OTHER): Payer: Managed Care, Other (non HMO) | Admitting: Internal Medicine

## 2014-03-13 ENCOUNTER — Encounter (INDEPENDENT_AMBULATORY_CARE_PROVIDER_SITE_OTHER): Payer: Self-pay | Admitting: Internal Medicine

## 2014-03-13 VITALS — BP 120/66 | HR 68 | Temp 98.4°F | Resp 18 | Ht 72.0 in | Wt 173.2 lb

## 2014-03-13 DIAGNOSIS — K50919 Crohn's disease, unspecified, with unspecified complications: Secondary | ICD-10-CM

## 2014-03-13 DIAGNOSIS — F439 Reaction to severe stress, unspecified: Secondary | ICD-10-CM

## 2014-03-13 DIAGNOSIS — B372 Candidiasis of skin and nail: Secondary | ICD-10-CM

## 2014-03-13 DIAGNOSIS — G8929 Other chronic pain: Secondary | ICD-10-CM

## 2014-03-13 DIAGNOSIS — Z658 Other specified problems related to psychosocial circumstances: Secondary | ICD-10-CM

## 2014-03-13 DIAGNOSIS — R109 Unspecified abdominal pain: Secondary | ICD-10-CM

## 2014-03-13 MED ORDER — HYDROCODONE-ACETAMINOPHEN 5-325 MG PO TABS: 1.0000 | ORAL_TABLET | Freq: Two times a day (BID) | ORAL | Status: DC | PRN

## 2014-03-13 MED ORDER — NYSTATIN 100000 UNIT/GM EX POWD: CUTANEOUS | Status: DC

## 2014-03-13 MED ORDER — AZATHIOPRINE 50 MG PO TABS: 100.0000 mg | ORAL_TABLET | Freq: Every day | ORAL | Status: DC

## 2014-03-13 MED ORDER — ALPRAZOLAM 0.5 MG PO TABS: 0.5000 mg | ORAL_TABLET | Freq: Two times a day (BID) | ORAL | Status: DC | PRN

## 2014-03-13 MED ORDER — PREDNISONE 5 MG PO TABS: 5.0000 mg | ORAL_TABLET | Freq: Every day | ORAL | Status: DC

## 2014-03-13 NOTE — Patient Instructions (Signed)
Next CBC with differential in December 2015

## 2014-03-13 NOTE — Progress Notes (Signed)
Presenting complaint;  Follow Crohn's disease and GERD.  Database;  Patient is 63 year old Caucasian male who has almost 40 year history of Crohn's disease who is here for scheduled visit. He was last seen in May 2015 when he was hospitalized for small bowel obstruction felt to be secondary to large parastomal hernia. He responded to conservative therapy. He was actually seen at [facility] surgery by Dr. Frederik Schmidt and underwent repair of parastomal hernia ileostomy takedown on 10/23/2013. Postop course was uneventful other than small abdominal wall abscess which required packing for few days and heals rapidly.  Subjective;  He is not having any GI problems but he needs several of his medications refilled. He feels he has less ileostomy output when he is on 10 mg of prednisone daily. Presently he is on 5 mg daily. He denies bleeding or melena into ileostomy. He develops skin rash inferior to ileostomy and is using antifungal powder on as needed basis. He denies rectal discharge. Several weeks ago he had a bowel movement in the past slender piece of grey stool. He complains of swelling involving left hand and wrist. This first occurred about a week prior to his surgery when he was on vacation and [place]. Symptoms resolved spontaneously prior to surgery. He had another episode about 2 months ago when he was seen at [facility] in orthopedics and cast was placed for 4 weeks with resolution of pain and swelling. Pain and swelling started 3 days ago. He has an appointment to see his orthopedic surgeon tomorrow. He states last blood work was 2 months ago.   Current Medications: Outpatient Encounter Prescriptions as of 03/13/2014  Medication Sig  . ALPRAZolam (XANAX) 0.5 MG tablet Take 1 tablet (0.5 mg total) by mouth at bedtime as needed for anxiety.  . Ascorbic Acid (VITAMIN C) 1000 MG tablet Take 1,000 mg by mouth daily.  Marland Kitchen azaTHIOprine (IMURAN) 50 MG tablet Take 50-100 mg by mouth 2  (two) times daily. Takes 2 tabs in am and 1 tab at night  . B Complex-C (SUPER B COMPLEX PO) Take 1 tablet by mouth daily.   . Calcium Carbonate-Vitamin D (CALCIUM 600 + D PO) Take 1 tablet by mouth 2 (two) times daily.   Marland Kitchen HYDROcodone-acetaminophen (NORCO/VICODIN) 5-325 MG per tablet Take 1-2 tablets by mouth every 4 (four) hours as needed for moderate pain.  . Multiple Vitamins-Minerals (MULTIVITAMIN PO) Take 1 tablet by mouth daily.   . pantoprazole (PROTONIX) 40 MG tablet Take 40 mg by mouth daily.  . predniSONE (DELTASONE) 5 MG tablet TAKE 1 TABLET BY MOUTH DAILY  . Zinc 50 MG CAPS Take 1 capsule by mouth daily.      Objective: Blood pressure 120/66, pulse 68, temperature 98.4 F (36.9 C), temperature source Oral, resp. rate 18, height 6' (1.829 m), weight 173 lb 3.2 oz (78.563 kg). Patient is alert and in no acute distress. Conjunctiva is pink. Sclera is nonicteric Oropharyngeal mucosa is normal. No neck masses or thyromegaly noted. Cardiac exam with regular rhythm normal S1 and S2. No murmur or gallop noted. Lungs are clear to auscultation. Abdomen. Ileostomy is located in right mid abdomen. There is focal erythema inferior to the ileostomy site. ileostomy bag has greenish yellow stool. Abdomen is soft and nontender without organomegaly or masses. There is edema to left hand and wrist. It is warm to touch and quite tender particularly at wrist joint.   Assessment:  #1. Long-standing Crohn's disease. He appears to be in remission. Episode of  small bowel obstruction in May 2015 was secondary to parastomal hernia and not due to Crohn's disease. There is no plan for him to be reconnected. It is possible that he may be able to come off immunosuppressive down the road. He has intermittent/chronic abdominal pain for which she uses pain medication on as needed basis. #2. GERD. Heartburn is well controlled with therapy. #3. Stress disorder secondary to chronic illness. #4. Cellulitis  involving left hand and wrist. I wonder if he has tenosynovium is an occult fracture. His process may or may not greater to inflammatory bowel disease. I doubt that it has anything to do with azathioprine. #5. Cutaneous fungal rash inferior to ileostomy site.  Plan:  CBC with differential in December 2015. New prescription given for alprazolam 0.5 mg by mouth twice a day when necessary 60 with 2 refills. New prescription given for azathioprine for one month with 5 refills. New prescription given for Vicodin 5/325 twice a day when necessary 60 without refill. New prescription also given for prednisone 5 mg 100 pills.  Final anemia prescription also given for nystatin powder 15g. Office visit in 6 months.

## 2014-03-14 ENCOUNTER — Other Ambulatory Visit: Payer: Self-pay | Admitting: Orthopaedic Surgery

## 2014-03-14 DIAGNOSIS — M7989 Other specified soft tissue disorders: Secondary | ICD-10-CM

## 2014-03-14 DIAGNOSIS — R52 Pain, unspecified: Secondary | ICD-10-CM

## 2014-03-20 ENCOUNTER — Ambulatory Visit
Admission: RE | Admit: 2014-03-20 | Discharge: 2014-03-20 | Disposition: A | Discharge status: Home / Self Care | Payer: Managed Care, Other (non HMO) | Source: Ambulatory Visit | Attending: Orthopaedic Surgery | Admitting: Orthopaedic Surgery

## 2014-03-20 DIAGNOSIS — R52 Pain, unspecified: Secondary | ICD-10-CM

## 2014-03-20 DIAGNOSIS — M7989 Other specified soft tissue disorders: Secondary | ICD-10-CM

## 2014-04-05 ENCOUNTER — Telehealth (INDEPENDENT_AMBULATORY_CARE_PROVIDER_SITE_OTHER): Payer: Self-pay | Admitting: *Deleted

## 2014-04-05 NOTE — Telephone Encounter (Signed)
Patient presented to the office and has a very edematous hand. He ask if Dr.Rehman could call in something for it. He has not heard the results from his scan on it yet.He has an appointment 05/10/14 with a physician.  Dr. Karilyn Cota states that we may call in Prednisone 5 mg : Take 20 mg by mouth for 5 days, 15 mg by mouth for 5 days , 10 mg my mouth for 5 days , 5 mg by mouth for 5 days. This was called to [facility]. Patient is aware.

## 2014-05-03 ENCOUNTER — Encounter (INDEPENDENT_AMBULATORY_CARE_PROVIDER_SITE_OTHER): Payer: Self-pay

## 2014-05-17 ENCOUNTER — Encounter (HOSPITAL_BASED_OUTPATIENT_CLINIC_OR_DEPARTMENT_OTHER): Payer: Self-pay | Admitting: *Deleted

## 2014-05-18 DIAGNOSIS — A319 Mycobacterial infection, unspecified: Secondary | ICD-10-CM

## 2014-05-18 HISTORY — DX: Mycobacterial infection, unspecified: A31.9

## 2014-05-22 ENCOUNTER — Other Ambulatory Visit: Payer: Self-pay | Admitting: Orthopedic Surgery

## 2014-05-24 ENCOUNTER — Encounter (HOSPITAL_BASED_OUTPATIENT_CLINIC_OR_DEPARTMENT_OTHER)
Admission: RE | Disposition: A | Discharge status: Home / Self Care | Payer: Self-pay | Source: Ambulatory Visit | Attending: Orthopedic Surgery

## 2014-05-24 ENCOUNTER — Encounter (HOSPITAL_BASED_OUTPATIENT_CLINIC_OR_DEPARTMENT_OTHER): Payer: Self-pay | Admitting: Anesthesiology

## 2014-05-24 ENCOUNTER — Ambulatory Visit (HOSPITAL_BASED_OUTPATIENT_CLINIC_OR_DEPARTMENT_OTHER): Payer: Managed Care, Other (non HMO) | Admitting: Anesthesiology

## 2014-05-24 ENCOUNTER — Ambulatory Visit (HOSPITAL_BASED_OUTPATIENT_CLINIC_OR_DEPARTMENT_OTHER)
Admission: RE | Admit: 2014-05-24 | Discharge: 2014-05-24 | Disposition: A | Discharge status: Home / Self Care | Payer: Managed Care, Other (non HMO) | Source: Ambulatory Visit | Attending: Orthopedic Surgery | Admitting: Orthopedic Surgery

## 2014-05-24 DIAGNOSIS — M8588 Other specified disorders of bone density and structure, other site: Secondary | ICD-10-CM | POA: Diagnosis not present

## 2014-05-24 DIAGNOSIS — Z885 Allergy status to narcotic agent status: Secondary | ICD-10-CM | POA: Insufficient documentation

## 2014-05-24 DIAGNOSIS — K5090 Crohn's disease, unspecified, without complications: Secondary | ICD-10-CM | POA: Insufficient documentation

## 2014-05-24 DIAGNOSIS — Z91013 Allergy to seafood: Secondary | ICD-10-CM | POA: Diagnosis not present

## 2014-05-24 DIAGNOSIS — X58XXXA Exposure to other specified factors, initial encounter: Secondary | ICD-10-CM | POA: Diagnosis not present

## 2014-05-24 DIAGNOSIS — K219 Gastro-esophageal reflux disease without esophagitis: Secondary | ICD-10-CM | POA: Diagnosis not present

## 2014-05-24 DIAGNOSIS — S66912A Strain of unspecified muscle, fascia and tendon at wrist and hand level, left hand, initial encounter: Secondary | ICD-10-CM | POA: Diagnosis present

## 2014-05-24 DIAGNOSIS — S63592A Other specified sprain of left wrist, initial encounter: Secondary | ICD-10-CM | POA: Insufficient documentation

## 2014-05-24 DIAGNOSIS — Y929 Unspecified place or not applicable: Secondary | ICD-10-CM | POA: Diagnosis not present

## 2014-05-24 DIAGNOSIS — Z91041 Radiographic dye allergy status: Secondary | ICD-10-CM | POA: Insufficient documentation

## 2014-05-24 DIAGNOSIS — M659 Synovitis and tenosynovitis, unspecified: Secondary | ICD-10-CM | POA: Diagnosis not present

## 2014-05-24 DIAGNOSIS — F419 Anxiety disorder, unspecified: Secondary | ICD-10-CM | POA: Insufficient documentation

## 2014-05-24 DIAGNOSIS — Z87891 Personal history of nicotine dependence: Secondary | ICD-10-CM | POA: Diagnosis not present

## 2014-05-24 HISTORY — DX: Anxiety disorder, unspecified: F41.9

## 2014-05-24 HISTORY — PX: WRIST ARTHROSCOPY WITH DEBRIDEMENT: SHX6194

## 2014-05-24 LAB — POCT HEMOGLOBIN-HEMACUE: Hemoglobin: 14.8 g/dL (ref 13.0–17.0)

## 2014-05-24 SURGERY — WRIST ARTHROSCOPY WITH DEBRIDEMENT
Anesthesia: General | Site: Wrist | Laterality: Left

## 2014-05-24 MED ORDER — ONDANSETRON HCL 4 MG/2ML IJ SOLN
INTRAMUSCULAR | Status: DC | PRN
  Administered 2014-05-24: 4 mg via INTRAVENOUS

## 2014-05-24 MED ORDER — CEFAZOLIN SODIUM-DEXTROSE 2-3 GM-% IV SOLR
2.0000 g | INTRAVENOUS | Status: AC
  Administered 2014-05-24: 2 g via INTRAVENOUS

## 2014-05-24 MED ORDER — MIDAZOLAM HCL 2 MG/2ML IJ SOLN
1.0000 mg | INTRAMUSCULAR | Status: DC | PRN
  Administered 2014-05-24: 2 mg via INTRAVENOUS

## 2014-05-24 MED ORDER — MIDAZOLAM HCL 2 MG/2ML IJ SOLN
INTRAMUSCULAR | Status: AC
  Filled 2014-05-24: qty 2

## 2014-05-24 MED ORDER — CHLORHEXIDINE GLUCONATE 4 % EX LIQD: 60.0000 mL | Freq: Once | CUTANEOUS | Status: DC

## 2014-05-24 MED ORDER — OXYCODONE HCL 5 MG PO TABS: 5.0000 mg | ORAL_TABLET | Freq: Once | ORAL | Status: DC | PRN

## 2014-05-24 MED ORDER — FENTANYL CITRATE 0.05 MG/ML IJ SOLN
50.0000 ug | INTRAMUSCULAR | Status: DC | PRN
  Administered 2014-05-24: 100 ug via INTRAVENOUS

## 2014-05-24 MED ORDER — HYDROMORPHONE HCL 1 MG/ML IJ SOLN: 0.2500 mg | INTRAMUSCULAR | Status: DC | PRN

## 2014-05-24 MED ORDER — OXYCODONE-ACETAMINOPHEN 10-325 MG PO TABS: 1.0000 | ORAL_TABLET | ORAL | Status: DC | PRN

## 2014-05-24 MED ORDER — PROPOFOL 10 MG/ML IV BOLUS
INTRAVENOUS | Status: DC | PRN
  Administered 2014-05-24: 200 mg via INTRAVENOUS

## 2014-05-24 MED ORDER — ONDANSETRON HCL 4 MG/2ML IJ SOLN: 4.0000 mg | Freq: Once | INTRAMUSCULAR | Status: DC | PRN

## 2014-05-24 MED ORDER — FENTANYL CITRATE 0.05 MG/ML IJ SOLN
INTRAMUSCULAR | Status: AC
  Filled 2014-05-24: qty 6

## 2014-05-24 MED ORDER — LIDOCAINE HCL (CARDIAC) 20 MG/ML IV SOLN
INTRAVENOUS | Status: DC | PRN
  Administered 2014-05-24: 80 mg via INTRAVENOUS

## 2014-05-24 MED ORDER — LACTATED RINGERS IV SOLN
INTRAVENOUS | Status: DC
Start: 2014-05-24 — End: 2014-05-24
  Administered 2014-05-24 (×2): via INTRAVENOUS

## 2014-05-24 MED ORDER — CEFAZOLIN SODIUM-DEXTROSE 2-3 GM-% IV SOLR
INTRAVENOUS | Status: AC
  Filled 2014-05-24: qty 50

## 2014-05-24 MED ORDER — OXYCODONE HCL 5 MG/5ML PO SOLN: 5.0000 mg | Freq: Once | ORAL | Status: DC | PRN

## 2014-05-24 MED ORDER — FENTANYL CITRATE 0.05 MG/ML IJ SOLN
INTRAMUSCULAR | Status: AC
  Filled 2014-05-24: qty 2

## 2014-05-24 MED ORDER — BUPIVACAINE-EPINEPHRINE (PF) 0.5% -1:200000 IJ SOLN
INTRAMUSCULAR | Status: DC | PRN
  Administered 2014-05-24: 25 mL via PERINEURAL

## 2014-05-24 SURGICAL SUPPLY — 87 items
BLADE CUDA 2.0 (BLADE) IMPLANT
BLADE EAR TYMPAN 2.5 60D BEAV (BLADE) IMPLANT
BLADE MINI RND TIP GREEN BEAV (BLADE) IMPLANT
BLADE SURG 15 STRL LF DISP TIS (BLADE) ×2 IMPLANT
BLADE SURG 15 STRL SS (BLADE) ×4
BNDG CMPR 9X4 STRL LF SNTH (GAUZE/BANDAGES/DRESSINGS)
BNDG COHESIVE 3X5 TAN STRL LF (GAUZE/BANDAGES/DRESSINGS) ×4 IMPLANT
BNDG ESMARK 4X9 LF (GAUZE/BANDAGES/DRESSINGS) IMPLANT
BNDG GAUZE ELAST 4 BULKY (GAUZE/BANDAGES/DRESSINGS) ×4 IMPLANT
BUR CUDA 2.9 (BURR) IMPLANT
BUR CUDA 2.9MM (BURR)
BUR FULL RADIUS 2.0 (BURR) IMPLANT
BUR FULL RADIUS 2.0MM (BURR)
BUR FULL RADIUS 2.9 (BURR) IMPLANT
BUR FULL RADIUS 2.9MM (BURR)
BUR GATOR 2.9 (BURR) IMPLANT
BUR GATOR 2.9MM (BURR)
BUR SPHERICAL 2.9 (BURR) IMPLANT
BUR SPHERICAL 2.9MM (BURR)
CANISTER SUCT 1200ML W/VALVE (MISCELLANEOUS) IMPLANT
CANISTER SUCT 3000ML (MISCELLANEOUS) IMPLANT
CHLORAPREP W/TINT 26ML (MISCELLANEOUS) ×4 IMPLANT
CORDS BIPOLAR (ELECTRODE) IMPLANT
COVER BACK TABLE 60X90IN (DRAPES) ×4 IMPLANT
COVER MAYO STAND STRL (DRAPES) ×4 IMPLANT
CUFF TOURNIQUET SINGLE 18IN (TOURNIQUET CUFF) IMPLANT
DRAPE EXTREMITY T 121X128X90 (DRAPE) ×4 IMPLANT
DRAPE OEC MINIVIEW 54X84 (DRAPES) IMPLANT
DRAPE SURG 17X23 STRL (DRAPES) ×4 IMPLANT
DRAPE U 20/CS (DRAPES) ×4 IMPLANT
DRSG KUZMA FLUFF (GAUZE/BANDAGES/DRESSINGS) IMPLANT
ELECT SMALL JOINT 90D BASC (ELECTRODE) IMPLANT
GAUZE SPONGE 4X4 12PLY STRL (GAUZE/BANDAGES/DRESSINGS) ×4 IMPLANT
GAUZE XEROFORM 1X8 LF (GAUZE/BANDAGES/DRESSINGS) ×4 IMPLANT
GLOVE BIOGEL M STRL SZ7.5 (GLOVE) ×4 IMPLANT
GLOVE BIOGEL PI IND STRL 7.0 (GLOVE) ×2 IMPLANT
GLOVE BIOGEL PI IND STRL 8 (GLOVE) ×1 IMPLANT
GLOVE BIOGEL PI IND STRL 8.5 (GLOVE) ×2 IMPLANT
GLOVE BIOGEL PI INDICATOR 7.0 (GLOVE) ×2
GLOVE BIOGEL PI INDICATOR 8 (GLOVE) ×2
GLOVE BIOGEL PI INDICATOR 8.5 (GLOVE) ×2
GLOVE ECLIPSE 6.5 STRL STRAW (GLOVE) ×3 IMPLANT
GLOVE EXAM NITRILE MD LF STRL (GLOVE) ×4 IMPLANT
GLOVE SURG ORTHO 8.0 STRL STRW (GLOVE) ×4 IMPLANT
GOWN STRL REUS W/ TWL LRG LVL3 (GOWN DISPOSABLE) ×2 IMPLANT
GOWN STRL REUS W/ TWL XL LVL3 (GOWN DISPOSABLE) ×1 IMPLANT
GOWN STRL REUS W/TWL LRG LVL3 (GOWN DISPOSABLE) ×4
GOWN STRL REUS W/TWL XL LVL3 (GOWN DISPOSABLE) ×8 IMPLANT
IV NS IRRIG 3000ML ARTHROMATIC (IV SOLUTION) ×4 IMPLANT
MANIFOLD NEPTUNE II (INSTRUMENTS) IMPLANT
NDL SAFETY ECLIPSE 18X1.5 (NEEDLE) ×2 IMPLANT
NDL SPNL 18GX3.5 QUINCKE PK (NEEDLE) IMPLANT
NEEDLE HYPO 18GX1.5 SHARP (NEEDLE) ×4
NEEDLE HYPO 22GX1.5 SAFETY (NEEDLE) ×7 IMPLANT
NEEDLE SPNL 18GX3.5 QUINCKE PK (NEEDLE) IMPLANT
NEEDLE TUOHY 20GX3.5 (NEEDLE) IMPLANT
NS IRRIG 1000ML POUR BTL (IV SOLUTION) IMPLANT
PACK BASIN DAY SURGERY FS (CUSTOM PROCEDURE TRAY) ×4 IMPLANT
PAD CAST 3X4 CTTN HI CHSV (CAST SUPPLIES) ×2 IMPLANT
PADDING CAST ABS 3INX4YD NS (CAST SUPPLIES) ×2
PADDING CAST ABS 4INX4YD NS (CAST SUPPLIES) ×2
PADDING CAST ABS COTTON 3X4 (CAST SUPPLIES) ×2 IMPLANT
PADDING CAST ABS COTTON 4X4 ST (CAST SUPPLIES) ×2 IMPLANT
PADDING CAST COTTON 3X4 STRL (CAST SUPPLIES) ×4
ROUTER HOODED VORTEX 2.9MM (BLADE) IMPLANT
SET ARTHROSCOPY TUBING (MISCELLANEOUS)
SET ARTHROSCOPY TUBING LN (MISCELLANEOUS) IMPLANT
SET EXT MALE ROTATING LL 32IN (MISCELLANEOUS) ×4 IMPLANT
SET IV EXT TUBING FEMALE 31 (MISCELLANEOUS) ×1 IMPLANT
SET SM JOINT TUBING/CANN (CANNULA) IMPLANT
SLEEVE SCD COMPRESS KNEE MED (MISCELLANEOUS) IMPLANT
SPLINT PLASTER CAST XFAST 3X15 (CAST SUPPLIES) IMPLANT
SPLINT PLASTER XTRA FASTSET 3X (CAST SUPPLIES)
STOCKINETTE 4X48 STRL (DRAPES) ×4 IMPLANT
SUCTION FRAZIER TIP 10 FR DISP (SUCTIONS) IMPLANT
SUT MERSILENE 4 0 P 3 (SUTURE) IMPLANT
SUT PDS AB 2-0 CT2 27 (SUTURE) IMPLANT
SUT STEEL 4 0 (SUTURE) IMPLANT
SUT VIC AB 2-0 PS2 27 (SUTURE) IMPLANT
SUT VICRYL 4-0 PS2 18IN ABS (SUTURE) IMPLANT
SYR BULB 3OZ (MISCELLANEOUS) ×4 IMPLANT
SYR CONTROL 10ML LL (SYRINGE) ×4 IMPLANT
TUBE CONNECTING 20'X1/4 (TUBING) ×1
TUBE CONNECTING 20X1/4 (TUBING) ×2 IMPLANT
UNDERPAD 30X30 INCONTINENT (UNDERPADS AND DIAPERS) ×4 IMPLANT
WAND 1.5 MICROBLATOR (SURGICAL WAND) IMPLANT
WATER STERILE IRR 1000ML POUR (IV SOLUTION) ×4 IMPLANT

## 2014-05-24 NOTE — Anesthesia Postprocedure Evaluation (Signed)
  Anesthesia Post-op Note  Patient: Lee Klein  Procedure(s) Performed: Procedure(s): LEFT ARTHROSCOPY WRIST CULTURE/BIOPSY DEBRIDEMENT (Left)  Patient Location: PACU  Anesthesia Type: General with regional   Level of Consciousness: awake, alert  and oriented  Airway and Oxygen Therapy: Patient Spontanous Breathing  Post-op Pain: none  Post-op Assessment: Post-op Vital signs reviewed  Post-op Vital Signs: Reviewed  Last Vitals:  Filed Vitals:   05/24/14 1545  BP: 151/78  Pulse: 60  Temp:   Resp: 18    Complications: No apparent anesthesia complications

## 2014-05-24 NOTE — Anesthesia Preprocedure Evaluation (Signed)
Anesthesia Evaluation  Patient identified by MRN, date of birth, ID band Patient awake    Reviewed: Allergy & Precautions, NPO status , Patient's Chart, lab work & pertinent test results  Airway Mallampati: II  TM Distance: >3 FB Neck ROM: Full    Dental  (+) Teeth Intact, Dental Advisory Given   Pulmonary former smoker,  breath sounds clear to auscultation        Cardiovascular Rhythm:Regular Rate:Normal     Neuro/Psych    GI/Hepatic GERD-  Medicated and Controlled,  Endo/Other    Renal/GU      Musculoskeletal   Abdominal   Peds  Hematology   Anesthesia Other Findings   Reproductive/Obstetrics                             Anesthesia Physical Anesthesia Plan  ASA: II  Anesthesia Plan: General   Post-op Pain Management:    Induction: Intravenous  Airway Management Planned: LMA  Additional Equipment:   Intra-op Plan:   Post-operative Plan: Extubation in OR  Informed Consent: I have reviewed the patients History and Physical, chart, labs and discussed the procedure including the risks, benefits and alternatives for the proposed anesthesia with the patient or authorized representative who has indicated his/her understanding and acceptance.   Dental advisory given  Plan Discussed with: CRNA, Anesthesiologist and Surgeon  Anesthesia Plan Comments:         Anesthesia Quick Evaluation

## 2014-05-24 NOTE — Progress Notes (Signed)
  Assisted Dr. Crews with left, ultrasound guided, supraclavicular block. Side rails up, monitors on throughout procedure. See vital signs in flow sheet. Tolerated Procedure well. 

## 2014-05-24 NOTE — Op Note (Signed)
Dictation Number [id number]

## 2014-05-24 NOTE — Anesthesia Procedure Notes (Addendum)
Anesthesia Regional Block:  Supraclavicular block  Pre-Anesthetic Checklist: ,, timeout performed, Correct Patient, Correct Site, Correct Laterality, Correct Procedure, Correct Position, site marked, Risks and benefits discussed,  Surgical consent,  Pre-op evaluation,  At surgeon's request and post-op pain management  Laterality: Left and Upper  Prep: chloraprep       Needles:  Injection technique: Single-shot  Needle Type: Echogenic Stimulator Needle     Needle Length: 5cm 5 cm Needle Gauge: 21 and 21 G    Additional Needles:  Procedures: ultrasound guided (picture in chart) Supraclavicular block Narrative:  Start time: 05/24/2014 1:52 PM End time: 05/24/2014 1:57 PM Injection made incrementally with aspirations every 5 mL.  Performed by: Personally  Anesthesiologist: CREWS, DAVID A   Procedure Name: LMA Insertion Date/Time: 05/24/2014 2:25 PM Performed by: [place] Pre-anesthesia Checklist: Patient identified, Emergency Drugs available, Suction available and Patient being monitored Patient Re-evaluated:Patient Re-evaluated prior to inductionOxygen Delivery Method: Circle System Utilized Preoxygenation: Pre-oxygenation with 100% oxygen Intubation Type: IV induction Ventilation: Mask ventilation without difficulty LMA: LMA inserted LMA Size: 5.0 Number of attempts: 1 Airway Equipment and Method: bite block Placement Confirmation: positive ETCO2 and breath sounds checked- equal and bilateral Tube secured with: Tape Dental Injury: Teeth and Oropharynx as per pre-operative assessment

## 2014-05-24 NOTE — Discharge Instructions (Addendum)
[facility] Instructions °Hand Surgery ° °Wound Care: °Keep your hand elevated above the level of your heart.  Do not allow it to dangle by your side.  Keep the dressing dry and do not remove it unless your doctor advises you to do so.  He will usually change it at the time of your post-op visit.  Moving your fingers is advised to stimulate circulation but will depend on the site of your surgery.  If you have a splint applied, your doctor will advise you regarding movement. ° °Activity: °Do not drive or operate machinery today.  Rest today and then you may return to your normal activity and work as indicated by your physician. ° °Diet:  °Drink liquids today or eat a light diet.  You may resume a regular diet tomorrow.   ° °General expectations: °Pain for two to three days. °Fingers may become slightly swollen. ° °Call your doctor if any of the following occur: °Severe pain not relieved by pain medication. °Elevated temperature. °Dressing soaked with blood. °Inability to move fingers. °White or bluish color to fingers. ° ° °Post Anesthesia Home Care Instructions ° °Activity: °Get plenty of rest for the remainder of the day. A responsible adult should stay with you for 24 hours following the procedure.  °For the next 24 hours, DO NOT: °-Drive a car °-Operate machinery °-Drink alcoholic beverages °-Take any medication unless instructed by your physician °-Make any legal decisions or sign important papers. ° °Meals: °Start with liquid foods such as gelatin or soup. Progress to regular foods as tolerated. Avoid greasy, spicy, heavy foods. If nausea and/or vomiting occur, drink only clear liquids until the nausea and/or vomiting subsides. Call your physician if vomiting continues. ° °Special Instructions/Symptoms: °Your throat may feel dry or sore from the anesthesia or the breathing tube placed in your throat during surgery. If this causes discomfort, gargle with warm salt water. The discomfort should disappear within 24  hours. ° ° °Regional Anesthesia Blocks ° °1. Numbness or the inability to move the "blocked" extremity may last from 3-48 hours after placement. The length of time depends on the medication injected and your individual response to the medication. If the numbness is not going away after 48 hours, call your surgeon. ° °2. The extremity that is blocked will need to be protected until the numbness is gone and the  Strength has returned. Because you cannot feel it, you will need to take extra care to avoid injury. Because it may be weak, you may have difficulty moving it or using it. You may not know what position it is in without looking at it while the block is in effect. ° °3. For blocks in the legs and feet, returning to weight bearing and walking needs to be done carefully. You will need to wait until the numbness is entirely gone and the strength has returned. You should be able to move your leg and foot normally before you try and bear weight or walk. You will need someone to be with you when you first try to ensure you do not fall and possibly risk injury. ° °4. Bruising and tenderness at the needle site are common side effects and will resolve in a few days. ° °5. Persistent numbness or new problems with movement should be communicated to the surgeon or the [facility] ([phone])/ [facility] ([phone]). °

## 2014-05-24 NOTE — Brief Op Note (Signed)
05/24/2014  3:20 PM  PATIENT:  Lee Klein  64 y.o. male  PRE-OPERATIVE DIAGNOSIS:  SCAPHOLUNATE TEAR SWELLING LEFT WRIST  POST-OPERATIVE DIAGNOSIS:  Swelling Left Wrist  PROCEDURE:  Procedure(s): LEFT ARTHROSCOPY WRIST CULTURE/BIOPSY DEBRIDEMENT (Left)  SURGEON:  Surgeon(s) and Role:    * Cindee Salt, MD - Primary  PHYSICIAN ASSISTANT:   ASSISTANTS: none   ANESTHESIA:   regional and general  EBL:  Total I/O In: 1000 [I.V.:1000] Out: -   BLOOD ADMINISTERED:none  DRAINS: none   LOCAL MEDICATIONS USED:  NONE  SPECIMEN:  Source of Specimen:  wrist, Aspirate, Biopsy / Limited Resection and Excision  DISPOSITION OF SPECIMEN:  PATHOLOGY  COUNTS:  YES  TOURNIQUET:    DICTATION: .Other Dictation: Dictation Number [id number]  PLAN OF CARE: Discharge to home after PACU  PATIENT DISPOSITION:  PACU - hemodynamically stable.

## 2014-05-24 NOTE — H&P (Signed)
Lee Klein is a 64 year-old right-hand dominant male referred by Dr. [name] for consultation with respect to swelling of his left wrist dorsal aspect, radial side. This has been going on since May.  He has seen multiple physicians. He has had this injected on two occasions. He was given antibiotics when this first began after bowel surgery.  He states that it went down, but returned in July after using it. He recalls no history of injury prior to that time.  He is on immunosuppression for Crohn's disease.  Dr. [name] is concerned about the possibility of infection vs. inflammatory prior to starting DMARDs.  He has had MRI done revealing scapholunate ligament injury, poor visualization of the ulnar attachment of the triangular fibrocartilage complex and erosive arthropathy with synovitis.  Etiology undetermined.  He is on methylprednisolone.  He has had laboratory work done revealing increased neutrophil, minimally increased white count and a slightly elevated sed rate, all of which can be due to either the possibility of an occult infection or arthropathy. He complains of a constant, moderate aching pain.  He is concerned about its progression.  He has no history of diabetes, thyroid problems, arthritis or gout.   ALLERGIES:     Demerol and IVP dye.  MEDICATIONS:    Imuran, anti-diarrheal tabs, Protonix, vitamin C and calcium.  SURGICAL HISTORY:    Colon surgeries (x7), hernia repair.  FAMILY MEDICAL HISTORY:     Positive for arthritis.  SOCIAL HISTORY:     He does not smoke or drink.  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  REVIEW OF SYSTEMS:   Positive for Crohn's disease, glasses, otherwise negative 14 points. Lee Klein is an 64 y.o. male.   Chief Complaint: Swelling left wrist HPI: see above  Past Medical History  Diagnosis Date  . Crohn's disease   . Hernia, abdominal   . GERD (gastroesophageal reflux disease)   . Anxiety     Past Surgical History   Procedure Laterality Date  . Ileostomy    . Colonoscopy    . Upper gastrointestinal endoscopy    . Appendectomy    . Colon surgery    . Wrist surgery      right  . Ileostomy closure N/A 10/23/2013    Procedure: ILEOSTOMY TAKEDOWN, REPAIR OF PARASTOMAL HERNIA.;  Surgeon: Cherylynn Ridges, MD;  Location: [facility] OR;  Service: General;  Laterality: N/A;  . Hernia repair      Family History  Problem Relation Age of Onset  . Healthy Mother   . Healthy Daughter   . Healthy Son    Social History:  reports that he quit smoking about 7 years ago. His smoking use included Cigars. He has quit using smokeless tobacco. He reports that he does not drink alcohol or use illicit drugs.  Allergies:  Allergies  Allergen Reactions  . Ivp Dye [Iodinated Diagnostic Agents]     Joint pain  . Fish Allergy Nausea And Vomiting  . Shellfish Allergy Swelling  . Demerol Rash    Medications Prior to Admission  Medication Sig Dispense Refill  . ALPRAZolam (XANAX) 0.5 MG tablet Take 1 tablet (0.5 mg total) by mouth 2 (two) times daily as needed for anxiety. 60 tablet 2  . Ascorbic Acid (VITAMIN C) 1000 MG tablet Take 1,000 mg by mouth daily.    Marland Kitchen azaTHIOprine (IMURAN) 50 MG tablet Take 2 tablets (100 mg total) by mouth daily. Takes 2 tabs in am and 1  tab at night 60 tablet 5  . Calcium Carbonate-Vitamin D (CALCIUM 600 + D PO) Take 1 tablet by mouth 2 (two) times daily.     Marland Kitchen HYDROcodone-acetaminophen (NORCO/VICODIN) 5-325 MG per tablet Take 1-2 tablets by mouth 2 (two) times daily as needed for moderate pain. 60 tablet 0  . methylPREDNIsolone (MEDROL DOSPACK) 4 MG tablet Take 20 mg by mouth 2 (two) times daily. follow package directions    . Multiple Vitamins-Minerals (MULTIVITAMIN PO) Take 1 tablet by mouth daily.     . pantoprazole (PROTONIX) 40 MG tablet Take 40 mg by mouth daily.    . Zinc 50 MG CAPS Take 1 capsule by mouth daily.       Results for orders placed or performed during the hospital encounter of  05/24/14 (from the past 48 hour(s))  Hemoglobin-hemacue, POC     Status: None   Collection Time: 05/24/14 12:44 PM  Result Value Ref Range   Hemoglobin 14.8 13.0 - 17.0 g/dL    No results found.   Pertinent items are noted in HPI.  Blood pressure 138/83, pulse 83, temperature 98.3 F (36.8 C), temperature source Oral, resp. rate 16, height  (1.778 m), weight 78.926 kg (174 lb), SpO2 98 %.  General appearance: alert, cooperative and appears stated age Head: Normocephalic, without obvious abnormality Neck: no JVD Resp: clear to auscultation bilaterally Cardio: regular rate and rhythm, S1, S2 normal, no murmur, click, rub or gallop GI: soft, non-tender; bowel sounds normal; no masses,  no organomegaly Extremities: Swelling left wrist Pulses: 2+ and symmetric Skin: Skin color, texture, turgor normal. No rashes or lesions Neurologic: Grossly normal Incision/Wound: na  Assessment/Plan RADIOGRAPHS:     X-rays are reviewed revealing changes in all of his carpal bones with osteopenia, a widened scapholunate ligament area.    His MRI reveals the same.  DIAGNOSIS/RECOMMENDATIONS/PLAN:      It is difficult to determine exactly what his underlying problem is, whether there is a chronic infection. If this were bacterial I would anticipate at this point in time seeing greater destruction of the bones.  There is always potential that this could be an atypical microbacteria or fungal infection.  This may all be due to arthritis. It may be due to the scapholunate ligament injury.  I am reluctant to simply stick a needle in this if we attempt to aspirate in that it is frequently very difficult to get enough fluid to check for crystals.    What I would recommend is an arthroscopy of his wrist, this will be dry to begin with, in an effort to obtain cultures, to visualize the scapholunate complex and the synovitis.  Synovial biopsy can also be obtained along with fluid.    He and his wife have  decided to proceed along this line.    This will be scheduled as an outpatient under regional anesthesia.  Caeson Filippi R 05/24/2014, 12:59 PM

## 2014-05-24 NOTE — Transfer of Care (Signed)
Immediate Anesthesia Transfer of Care Note  Patient: Lee Klein  Procedure(s) Performed: Procedure(s): LEFT ARTHROSCOPY WRIST CULTURE/BIOPSY DEBRIDEMENT (Left)  Patient Location: PACU  Anesthesia Type:GA combined with regional for post-op pain  Level of Consciousness: awake, alert , oriented and patient cooperative  Airway & Oxygen Therapy: Patient Spontanous Breathing and Patient connected to face mask oxygen  Post-op Assessment: Report given to PACU RN and Post -op Vital signs reviewed and stable  Post vital signs: Reviewed and stable  Complications: No apparent anesthesia complications

## 2014-05-25 ENCOUNTER — Encounter (HOSPITAL_BASED_OUTPATIENT_CLINIC_OR_DEPARTMENT_OTHER): Payer: Self-pay | Admitting: Orthopedic Surgery

## 2014-05-25 NOTE — Op Note (Signed)
NAMESRIYAN, TERP NO.:  [id number]  MEDICAL RECORD NO.:  [id number]  LOCATION:                                 FACILITY:  PHYSICIAN:  Cindee Salt, M.D.            DATE OF BIRTH:  DATE OF PROCEDURE:  05/24/2014 DATE OF DISCHARGE:                              OPERATIVE REPORT   PREOPERATIVE DIAGNOSES:  Erosive synovitis with scapholunate ligament tear, swelling of left wrist.  POSTOPERATIVE DIAGNOSES:  Erosive synovitis with scapholunate ligament tear, swelling of left wrist.  OPERATION:  Arthroscopy, left wrist, with debridement of scapholunate ligament tear, biopsies along with cultures.  SURGEON:  Cindee Salt, M.D.  ASSISTANT:  None.  ANESTHESIA:  Supraclavicular block, general.  ANESTHESIOLOGIST:  Sheldon Silvan, M.D.  HISTORY:  The patient is a 64 year old male with a history of swelling of his left wrist.  This has been going on for approximately 7 months. He has seen multiple physicians, most recently is referred from Rheumatology for aspirate due to being immunosuppressed with a plan on placing him on Enbrel.  His swelling is concerned to be certain that there is no infectious process.  He has had multiple injections done. He has a history of colitis.  He is aware that there is no guarantee with the surgery; possibility of infection; recurrence of injury to arteries, nerves, tendons; incomplete relief of symptoms; dystrophy.  He would like to proceed.  In the preoperative area, the patient was seen, the extremity marked by both the patient and surgeon.  PROCEDURE IN DETAIL:  The patient was brought to the operating room.  A supraclavicular block was carried out without difficulty.  In the preoperative area, he was given a general anesthetic in supine position with the left arm free.  He was prepped with ChloraPrep.  3-minute dry time was allowed.  Time-out taken, confirming the patient and procedure. The limb was placed in the Arc arthroscopy  tower, 10 pounds of traction applied.  An aspirate was then performed, revealing the joint fluid with some minor bleeding, approximately 1 mL was able to be obtained.  This was moderately cloudy, but had general appearance of synovial fluid. This was sent for culture, Gram stain, crystals, cultures for mycobacteria fungus, aerobic and anaerobic cultures.  The joint was then inflated to 3/4 portal.  Transverse incision made, deepened with a hemostat.  Blunt trocar used to enter the joint.  Joint was inspected. A massive scapholunate ligament tear was immediately apparent.  Very significant arthritic changes were present on the distal scaphoid including the entire scaphoid facet with a kissing lesion on the scaphoid.  Very significant synovitis was present about the entire area. A TFCC tear was noted.  The proximal lunate did not show any significant degenerative changes.  An irrigation catheter was placed in 6U.  A 4/5 portal opened.  A thorough debridement was then performed with a full radius shaver to the TFCC.  Biopsies were then taken of the synovial tissue from the radial side.  The midcarpal joint was inspected through the midcarpal ulnar portal.  There were no significant changes on the proximal  capitate.  A complete tear with easy Geissler IV drive-through sign was present.  There appeared to be no dorsal carpal ligaments between the scaphoid lunate with the scope able to be passed through the scapholunate facet over the top of the lunate and over the top of the scaphoid to allow visualization of the midcarpal joint.  The joint was then copiously irrigated.  The portals closed with interrupted 5-0 nylon sutures.  A sterile compressive dressing, dorsal palmar splint applied. The tourniquet was not inflated throughout the procedure.  The synovial tissue was cauterized with an ArthroWand to prevent excessive bleeding. The patient tolerated the procedure well and was taken to the  recovery room for observation in satisfactory condition.  He will be discharged home to return in 1 week on Percocet.          ______________________________ Cindee Salt, M.D.     GK/MEDQ  D:  05/24/2014  T:  05/25/2014  Job:  [id number]

## 2014-05-29 LAB — ANAEROBIC CULTURE

## 2014-05-30 ENCOUNTER — Ambulatory Visit (INDEPENDENT_AMBULATORY_CARE_PROVIDER_SITE_OTHER): Payer: Managed Care, Other (non HMO) | Admitting: Infectious Diseases

## 2014-05-30 ENCOUNTER — Encounter: Payer: Self-pay | Admitting: Infectious Diseases

## 2014-05-30 VITALS — BP 132/89 | HR 103 | Temp 98.0°F | Ht 69.0 in | Wt 172.0 lb

## 2014-05-30 DIAGNOSIS — A319 Mycobacterial infection, unspecified: Secondary | ICD-10-CM

## 2014-05-30 MED ORDER — AZITHROMYCIN 250 MG PO TABS: 250.0000 mg | ORAL_TABLET | Freq: Every day | ORAL | Status: DC

## 2014-05-30 MED ORDER — RIFAMPIN 300 MG PO CAPS: 300.0000 mg | ORAL_CAPSULE | Freq: Every day | ORAL | Status: DC

## 2014-05-30 NOTE — Assessment & Plan Note (Addendum)
Very unusual case but not uncommon in Crohn's patients who are on immunosuppresants. 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 Will start him preseumptively on rifampin/azithro. Would try to limit his steroid and imuran use, and absolutely avoid DMARDS til this is treated.  Will see him back in 3 weeks.  This is explained to pt and his wife in detail. I told them of uniqueness of this infection as well as that it would be very uncommon for this to be life or limb threatening.

## 2014-05-30 NOTE — Progress Notes (Signed)
   Subjective:    Patient ID: Lee Klein, male    DOB: 28-Jul-1950, 64 y.o.   MRN: [id number]  HPI 64 yo M with hx of Crohn's for 40 yrs (on azathioprine for >15 yrs), ileostomy/colostomy (2010) and L wrist swelling since May 2015. He received previous depo-medrol injections, then started on oral medrol.  He was admitted on 05-24-14 and underwent arthroscopy where he was found to have erosive synovitis with scapholunate ligament tear. He underwent debridement, biopsies. He was found on AFB stain to be 1+.  Hhis pathology showed degenerative features, chronic inflammation, dystrophic calcifications.  No fever or chills.  Had previously negative TB test at his rheumatologist. No hx of TB exposure, no hx of overseas travel, no hx of homelessness, no hx of incarceration.  No hx of trauma to his R wrist. He has had decreased strength.   Soc/fhx reviewed.   Review of Systems  Constitutional: Negative for fever, chills and unexpected weight change.  Gastrointestinal: Positive for diarrhea.  Genitourinary: Positive for frequency. Negative for difficulty urinating.  Musculoskeletal: Positive for arthralgias.       Objective:   Physical Exam  Constitutional: He appears well-developed and well-nourished.  HENT:  Mouth/Throat: No oropharyngeal exudate.  Eyes: EOM are normal. Pupils are equal, round, and reactive to light.  Neck: Neck supple.  Cardiovascular: Normal rate, regular rhythm and normal heart sounds.   Pulmonary/Chest: Effort normal and breath sounds normal.  Abdominal: Soft. Bowel sounds are normal. He exhibits no distension. There is no tenderness.    Musculoskeletal:       Arms: Lymphadenopathy:    He has no cervical adenopathy.       Left axillary: No lateral adenopathy present.         Assessment & Plan:

## 2014-05-31 ENCOUNTER — Telehealth (INDEPENDENT_AMBULATORY_CARE_PROVIDER_SITE_OTHER): Payer: Self-pay | Admitting: *Deleted

## 2014-05-31 NOTE — Telephone Encounter (Signed)
Patient was called and a message was left on his voicemail to call me back.

## 2014-05-31 NOTE — Progress Notes (Signed)
Will see patient soon and may stop imuran

## 2014-05-31 NOTE — Telephone Encounter (Signed)
Lee Klein would like to talk with Lee Klein. They have found out what is going on with his hand and a question about a medication he is on. The return phone number is [phone]

## 2014-06-01 NOTE — Telephone Encounter (Signed)
I talked with the patient on 05/31/14. He states his arm was in a cast and that he had been told by the doctor in [place], that he had a Microbacterium Infection. Will be in the cast 7 weeks. He has been given two antibiotics. Lee Klein 300 mg that he takes by mouth daily , Azithromycin 250 mg daily. Patient was also told that the Imuran was lowering his immune system. He ask if he can stop the Imuran or drop the pill by 1.  He and his wife would like an appointment with Dr.Rehman to discuss his condition. Wife is off Monday of next week and he feels that she is working Tuesday and Wednesday.   Per Dr.Rehman the patient may discontinue the Imuran. Patient will be called with an appointment to see Dr.Rehman.   Dr.Rehman when would you want to bring Lee Klein and Lee Klein in for a visit. Monday is booked this upcoming week.

## 2014-06-01 NOTE — Telephone Encounter (Signed)
Let us bring him to office for a visit next Thursday

## 2014-06-07 ENCOUNTER — Encounter (INDEPENDENT_AMBULATORY_CARE_PROVIDER_SITE_OTHER): Payer: Self-pay | Admitting: Internal Medicine

## 2014-06-07 ENCOUNTER — Ambulatory Visit (INDEPENDENT_AMBULATORY_CARE_PROVIDER_SITE_OTHER): Payer: Managed Care, Other (non HMO) | Admitting: Internal Medicine

## 2014-06-07 VITALS — BP 120/86 | HR 74 | Temp 97.6°F | Resp 18 | Ht 72.0 in | Wt 162.5 lb

## 2014-06-07 DIAGNOSIS — K50919 Crohn's disease, unspecified, with unspecified complications: Secondary | ICD-10-CM

## 2014-06-07 DIAGNOSIS — R1013 Epigastric pain: Secondary | ICD-10-CM

## 2014-06-07 DIAGNOSIS — R110 Nausea: Secondary | ICD-10-CM

## 2014-06-07 MED ORDER — PANTOPRAZOLE SODIUM 40 MG PO TBEC: 40.0000 mg | DELAYED_RELEASE_TABLET | Freq: Two times a day (BID) | ORAL | Status: DC

## 2014-06-07 MED ORDER — ONDANSETRON HCL 4 MG PO TABS: 4.0000 mg | ORAL_TABLET | Freq: Two times a day (BID) | ORAL | Status: DC | PRN

## 2014-06-07 NOTE — Telephone Encounter (Signed)
Patient has an apt scheduled for 06/07/14 with Dr. Karilyn Cota.

## 2014-06-07 NOTE — Patient Instructions (Signed)
Begin prednisone 5 mg by mouth daily when you come off Medrol. Drop prednisone dose by 1 mg every 2 weeks if tolerated. Progress report in one week.

## 2014-06-07 NOTE — Progress Notes (Signed)
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 Epigastric pain and nausea.  Subjective:  Patient is 64 year old Caucasian male who is here for scheduled visit accompanied by his wife pain. He was last seen on 03/13/2014 when he was having problems with painful left wrist. This problem started about 7 months ago. Initially had transient response with antibiotics but his symptoms relapse. 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 He was begun on azithromycin and rifampin and within a week he could tell the difference. He has mild pain and swelling has decreased. Patient saw Dr. Ninetta Lights of [facility] ID service. His infection was felt to be most due to chronic immunosuppression. Patient called our office and he was advised to discontinue azathioprine. Patient is not having any problems in the form of melena or leading into ileostomy is also not having rectal discharge. He's been having problems with sinusitis and responding poorly to therapy. He does not feel well. He complains of epigastric pain nausea and poor appetite. He has not lost any weight though. He is also under care of Dr. Zenovia Jordan, rheumatologist and he has been given Medrol taper schedule. He wants to know when he should be back on prednisone. He was on 5 mg by mouth daily. He denies fever chills or vomiting.   Current Medications: Outpatient Encounter Prescriptions as of 06/07/2014  Medication Sig  . ALPRAZolam (XANAX) 0.5 MG tablet Take 1 tablet (0.5 mg total) by mouth 2 (two) times daily as needed for anxiety.  . Ascorbic Acid (VITAMIN C) 1000 MG tablet Take 1,000 mg by mouth daily.  Marland Kitchen azithromycin (ZITHROMAX) 250 MG tablet Take 1 tablet (250 mg total) by mouth daily.  . Calcium Carbonate-Vitamin D (CALCIUM 600 + D PO) Take 1 tablet by mouth 2 (two) times daily.   Marland Kitchen FLUARIX  QUADRIVALENT 0.5 ML injection   . HYDROcodone-acetaminophen (NORCO/VICODIN) 5-325 MG per tablet Take 1-2 tablets by mouth 2 (two) times daily as needed for moderate pain. (Patient taking differently: Take 1 tablet by mouth at bedtime as needed for moderate pain. )  . methylPREDNISolone (MEDROL) 4 MG tablet Take 4 mg by mouth. Decrease Medrol to 2 tabs in the AM, 3 tabs in the PM for 3 days, then 2 tabs twice a day for 3 days, then 1 tab in the AM and 2 tabs in PM for 3 days, Twice daily for 3 days, then 1 tab daily for 3 days, then stop.  . Multiple Vitamins-Minerals (MULTIVITAMIN PO) Take 1 tablet by mouth daily.   Marland Kitchen nystatin (MYCOSTATIN/NYSTOP) 100000 UNIT/GM POWD Apply topically See admin instructions. PRN  . pantoprazole (PROTONIX) 40 MG tablet Take 40 mg by mouth daily.  . rifampin (RIFADIN) 300 MG capsule Take 1 capsule (300 mg total) by mouth daily.  . Zinc 50 MG CAPS Take 1 capsule by mouth daily.   Marland Kitchen azaTHIOprine (IMURAN) 50 MG tablet Take 2 tablets (100 mg total) by mouth daily. Takes 2 tabs in am and 1 tab at night (Patient not taking: Reported on 06/07/2014)  . [DISCONTINUED] methylPREDNIsolone (MEDROL DOSPACK) 4 MG tablet Take 20 mg by mouth 2 (two) times daily. follow package directions  . [DISCONTINUED] methylPREDNISolone (MEDROL) 4 MG tablet Take 4 mg by mouth 2 (two) times daily.  . [DISCONTINUED] oxyCODONE-acetaminophen (PERCOCET) 10-325 MG per tablet Take 1 tablet by mouth every 4 (four) hours as  needed for pain. (Patient not taking: Reported on 06/07/2014)     Objective: Blood pressure 120/86, pulse 74, temperature 97.6 F (36.4 C), temperature source Oral, resp. rate 18, height 6' (1.829 m), weight 162 lb 8 oz (73.71 kg). Patient is alert and has round facies. Conjunctiva is pink. Sclera is nonicteric Oropharyngeal mucosa is normal. No neck masses or thyromegaly noted. Cardiac exam with regular rhythm normal S1 and S2. No murmur or gallop noted. Lungs are clear to  auscultation. Abdomen. He has ileostomy and right midabdomen. Mucosa at ileostomy appears to be healthy and not tender. There is greenish brown stool in ileostomy bag. Abdomen is soft with mild midepigastric tenderness. No organomegaly or masses.  No LE edema or clubbing noted. There is edema to back of left wrist.    Assessment:  #1. History of small bowel Crohn's disease. He has ileostomy. He appears to be in remission. 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 Will also try to get him off prednisone. He has been on steroids since 1977. Multiple attempts at tapering have failed in the past. #2. 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 Final cultures are pending. There has been significant reduction in swelling and pain since he has been on rifampin and azithromycin. I agree with Dr. Ninetta Lights that chronic immunosuppression for IBD has predisposed him to this infection. #3. Epigastric pain and nausea. These symptoms could be secondary to antibiotics or he could have peptic ulcer disease. If he does not respond to double dose PPI will consider lab studies and diagnostic EGD. Please note he has had remote cholecystectomy.    Plan:  Azathioprine discontinued last week. Increase pantoprazole to 40 mg by mouth twice a day. Ondansetron 4 mg by mouth twice a day when necessary. Patient will begin prednisone 5 mg by mouth daily when he tapers off Medrol. Thereafter he'll try to decrease dose by 1 mg every 2 weeks. Office visit in 3 months.

## 2014-06-09 ENCOUNTER — Telehealth: Payer: Self-pay | Admitting: Infectious Diseases

## 2014-06-09 NOTE — Telephone Encounter (Signed)
Has had loss of appetite, wt loss (10#) and dizziness since being on anbx.  Asked him to stop rifampin.  Hand looks better

## 2014-06-11 ENCOUNTER — Inpatient Hospital Stay (HOSPITAL_COMMUNITY): Payer: Managed Care, Other (non HMO)

## 2014-06-11 ENCOUNTER — Emergency Department (HOSPITAL_COMMUNITY): Payer: Managed Care, Other (non HMO)

## 2014-06-11 ENCOUNTER — Inpatient Hospital Stay (HOSPITAL_COMMUNITY)
Admission: EM | Admit: 2014-06-11 | Discharge: 2014-06-13 | DRG: 683 | Disposition: A | Discharge status: Home / Self Care | Payer: Managed Care, Other (non HMO) | Attending: Internal Medicine | Admitting: Internal Medicine

## 2014-06-11 ENCOUNTER — Encounter (HOSPITAL_COMMUNITY): Payer: Self-pay

## 2014-06-11 DIAGNOSIS — A319 Mycobacterial infection, unspecified: Secondary | ICD-10-CM

## 2014-06-11 DIAGNOSIS — E876 Hypokalemia: Secondary | ICD-10-CM | POA: Diagnosis present

## 2014-06-11 DIAGNOSIS — N179 Acute kidney failure, unspecified: Secondary | ICD-10-CM | POA: Diagnosis present

## 2014-06-11 DIAGNOSIS — D649 Anemia, unspecified: Secondary | ICD-10-CM | POA: Diagnosis present

## 2014-06-11 DIAGNOSIS — K210 Gastro-esophageal reflux disease with esophagitis: Secondary | ICD-10-CM | POA: Diagnosis present

## 2014-06-11 DIAGNOSIS — N289 Disorder of kidney and ureter, unspecified: Secondary | ICD-10-CM

## 2014-06-11 DIAGNOSIS — T3695XA Adverse effect of unspecified systemic antibiotic, initial encounter: Secondary | ICD-10-CM | POA: Diagnosis present

## 2014-06-11 DIAGNOSIS — E860 Dehydration: Secondary | ICD-10-CM | POA: Diagnosis present

## 2014-06-11 DIAGNOSIS — K5090 Crohn's disease, unspecified, without complications: Secondary | ICD-10-CM | POA: Diagnosis present

## 2014-06-11 DIAGNOSIS — R197 Diarrhea, unspecified: Secondary | ICD-10-CM | POA: Diagnosis present

## 2014-06-11 DIAGNOSIS — D899 Disorder involving the immune mechanism, unspecified: Secondary | ICD-10-CM | POA: Diagnosis present

## 2014-06-11 DIAGNOSIS — R630 Anorexia: Secondary | ICD-10-CM | POA: Diagnosis present

## 2014-06-11 DIAGNOSIS — Z7952 Long term (current) use of systemic steroids: Secondary | ICD-10-CM | POA: Diagnosis not present

## 2014-06-11 DIAGNOSIS — R9431 Abnormal electrocardiogram [ECG] [EKG]: Secondary | ICD-10-CM

## 2014-06-11 DIAGNOSIS — M659 Synovitis and tenosynovitis, unspecified: Secondary | ICD-10-CM | POA: Diagnosis present

## 2014-06-11 DIAGNOSIS — R0789 Other chest pain: Secondary | ICD-10-CM | POA: Diagnosis present

## 2014-06-11 DIAGNOSIS — Z87891 Personal history of nicotine dependence: Secondary | ICD-10-CM | POA: Diagnosis not present

## 2014-06-11 DIAGNOSIS — R079 Chest pain, unspecified: Secondary | ICD-10-CM

## 2014-06-11 DIAGNOSIS — F419 Anxiety disorder, unspecified: Secondary | ICD-10-CM | POA: Diagnosis present

## 2014-06-11 DIAGNOSIS — R1110 Vomiting, unspecified: Secondary | ICD-10-CM

## 2014-06-11 DIAGNOSIS — E871 Hypo-osmolality and hyponatremia: Secondary | ICD-10-CM | POA: Diagnosis present

## 2014-06-11 LAB — CBC
HCT: 36.3 % — ABNORMAL LOW (ref 39.0–52.0)
Hemoglobin: 13.4 g/dL (ref 13.0–17.0)
MCH: 33.5 pg (ref 26.0–34.0)
MCHC: 36.9 g/dL — AB (ref 30.0–36.0)
MCV: 90.8 fL (ref 78.0–100.0)
Platelets: 237 10*3/uL (ref 150–400)
RBC: 4 MIL/uL — ABNORMAL LOW (ref 4.22–5.81)
RDW: 14.9 % (ref 11.5–15.5)
WBC: 9.1 10*3/uL (ref 4.0–10.5)

## 2014-06-11 LAB — HEPATIC FUNCTION PANEL
ALT: 34 U/L (ref 0–53)
AST: 41 U/L — ABNORMAL HIGH (ref 0–37)
Albumin: 2.9 g/dL — ABNORMAL LOW (ref 3.5–5.2)
Alkaline Phosphatase: 86 U/L (ref 39–117)
Bilirubin, Direct: 0.2 mg/dL (ref 0.0–0.5)
Indirect Bilirubin: 0.6 mg/dL (ref 0.3–0.9)
Total Bilirubin: 0.8 mg/dL (ref 0.3–1.2)
Total Protein: 6.2 g/dL (ref 6.0–8.3)

## 2014-06-11 LAB — URINE MICROSCOPIC-ADD ON

## 2014-06-11 LAB — BASIC METABOLIC PANEL
Anion gap: 14 (ref 5–15)
BUN: 12 mg/dL (ref 6–23)
CALCIUM: 8.9 mg/dL (ref 8.4–10.5)
CO2: 27 mmol/L (ref 19–32)
CREATININE: 1.39 mg/dL — AB (ref 0.50–1.35)
Chloride: 91 mmol/L — ABNORMAL LOW (ref 96–112)
GFR calc Af Amer: 62 mL/min — ABNORMAL LOW (ref >90)
GFR calc non Af Amer: 53 mL/min — ABNORMAL LOW (ref >90)
Glucose, Bld: 123 mg/dL — ABNORMAL HIGH (ref 70–99)
POTASSIUM: 3.2 mmol/L — AB (ref 3.5–5.1)
Sodium: 132 mmol/L — ABNORMAL LOW (ref 135–145)

## 2014-06-11 LAB — TROPONIN I: Troponin I: <0.03 ng/mL (ref <0.031)

## 2014-06-11 LAB — I-STAT TROPONIN, ED: TROPONIN I, POC: 0 ng/mL (ref 0.00–0.08)

## 2014-06-11 LAB — POC OCCULT BLOOD, ED: FECAL OCCULT BLD: NEGATIVE

## 2014-06-11 LAB — URINALYSIS, ROUTINE W REFLEX MICROSCOPIC
Bilirubin Urine: NEGATIVE
Glucose, UA: NEGATIVE mg/dL
Hgb urine dipstick: NEGATIVE
Ketones, ur: NEGATIVE mg/dL
Leukocytes, UA: NEGATIVE
NITRITE: NEGATIVE
PROTEIN: 30 mg/dL — AB
Specific Gravity, Urine: 1.013 (ref 1.005–1.030)
UROBILINOGEN UA: 0.2 mg/dL (ref 0.0–1.0)
pH: 6 (ref 5.0–8.0)

## 2014-06-11 LAB — LIPASE, BLOOD: Lipase: 39 U/L (ref 11–59)

## 2014-06-11 LAB — D-DIMER, QUANTITATIVE: D-Dimer, Quant: 3.89 ug/mL-FEU — ABNORMAL HIGH (ref 0.00–0.48)

## 2014-06-11 MED ORDER — METHYLPREDNISOLONE 4 MG PO TABS
4.0000 mg | ORAL_TABLET | Freq: Every day | ORAL | Status: DC
  Filled 2014-06-11 (×2): qty 1

## 2014-06-11 MED ORDER — ONDANSETRON HCL 4 MG PO TABS: 4.0000 mg | ORAL_TABLET | Freq: Four times a day (QID) | ORAL | Status: DC | PRN

## 2014-06-11 MED ORDER — ADULT MULTIVITAMIN W/MINERALS CH
1.0000 | ORAL_TABLET | Freq: Every day | ORAL | Status: DC
  Administered 2014-06-11 – 2014-06-13 (×3): 1 via ORAL
  Filled 2014-06-11 (×3): qty 1

## 2014-06-11 MED ORDER — METHYLPREDNISOLONE 4 MG PO TABS: 4.0000 mg | ORAL_TABLET | Freq: Every day | ORAL | Status: DC

## 2014-06-11 MED ORDER — ACETAMINOPHEN 650 MG RE SUPP: 650.0000 mg | Freq: Four times a day (QID) | RECTAL | Status: DC | PRN

## 2014-06-11 MED ORDER — PANTOPRAZOLE SODIUM 40 MG IV SOLR
40.0000 mg | Freq: Two times a day (BID) | INTRAVENOUS | Status: DC
  Administered 2014-06-11 – 2014-06-12 (×2): 40 mg via INTRAVENOUS
  Filled 2014-06-11 (×3): qty 40

## 2014-06-11 MED ORDER — SODIUM CHLORIDE 0.9 % IV BOLUS (SEPSIS)
1000.0000 mL | Freq: Once | INTRAVENOUS | Status: AC
  Administered 2014-06-11: 1000 mL via INTRAVENOUS

## 2014-06-11 MED ORDER — VITAMIN C 500 MG PO TABS
1000.0000 mg | ORAL_TABLET | Freq: Every day | ORAL | Status: DC
  Administered 2014-06-11 – 2014-06-13 (×3): 1000 mg via ORAL
  Filled 2014-06-11 (×3): qty 2

## 2014-06-11 MED ORDER — MORPHINE SULFATE 4 MG/ML IJ SOLN
4.0000 mg | Freq: Once | INTRAMUSCULAR | Status: AC
  Administered 2014-06-11: 4 mg via INTRAVENOUS
  Filled 2014-06-11: qty 1

## 2014-06-11 MED ORDER — ENOXAPARIN SODIUM 40 MG/0.4ML ~~LOC~~ SOLN
40.0000 mg | SUBCUTANEOUS | Status: DC
  Administered 2014-06-11: 40 mg via SUBCUTANEOUS
  Filled 2014-06-11 (×2): qty 0.4

## 2014-06-11 MED ORDER — METHYLPREDNISOLONE 4 MG PO TABS: 8.0000 mg | ORAL_TABLET | Freq: Two times a day (BID) | ORAL | Status: DC

## 2014-06-11 MED ORDER — AZITHROMYCIN 250 MG PO TABS
250.0000 mg | ORAL_TABLET | Freq: Every day | ORAL | Status: DC
  Administered 2014-06-11 – 2014-06-13 (×3): 250 mg via ORAL
  Filled 2014-06-11 (×3): qty 1

## 2014-06-11 MED ORDER — TECHNETIUM TO 99M ALBUMIN AGGREGATED
6.0000 | Freq: Once | INTRAVENOUS | Status: AC | PRN
  Administered 2014-06-11: 6 via INTRAVENOUS

## 2014-06-11 MED ORDER — SODIUM CHLORIDE 0.9 % IV SOLN
INTRAVENOUS | Status: DC
  Administered 2014-06-11 – 2014-06-13 (×3): via INTRAVENOUS
  Filled 2014-06-11 (×5): qty 1000

## 2014-06-11 MED ORDER — PANTOPRAZOLE SODIUM 40 MG IV SOLR
40.0000 mg | Freq: Once | INTRAVENOUS | Status: AC
  Administered 2014-06-11: 40 mg via INTRAVENOUS
  Filled 2014-06-11: qty 40

## 2014-06-11 MED ORDER — ONDANSETRON HCL 4 MG/2ML IJ SOLN: 4.0000 mg | Freq: Four times a day (QID) | INTRAMUSCULAR | Status: DC | PRN

## 2014-06-11 MED ORDER — ACETAMINOPHEN 325 MG PO TABS
650.0000 mg | ORAL_TABLET | Freq: Four times a day (QID) | ORAL | Status: DC | PRN
Start: 2014-06-11 — End: 2014-06-13
  Administered 2014-06-11 – 2014-06-12 (×3): 650 mg via ORAL
  Filled 2014-06-11 (×3): qty 2

## 2014-06-11 MED ORDER — METHYLPREDNISOLONE 4 MG PO TABS
8.0000 mg | ORAL_TABLET | Freq: Every day | ORAL | Status: DC
  Administered 2014-06-11: 8 mg via ORAL
  Filled 2014-06-11 (×2): qty 2

## 2014-06-11 MED ORDER — ASPIRIN EC 325 MG PO TBEC
325.0000 mg | DELAYED_RELEASE_TABLET | Freq: Every day | ORAL | Status: DC
  Administered 2014-06-12 – 2014-06-13 (×2): 325 mg via ORAL
  Filled 2014-06-11 (×2): qty 1

## 2014-06-11 MED ORDER — ALPRAZOLAM 0.5 MG PO TABS: 0.5000 mg | ORAL_TABLET | Freq: Every day | ORAL | Status: DC | PRN

## 2014-06-11 MED ORDER — SODIUM CHLORIDE 0.9 % IJ SOLN
3.0000 mL | Freq: Two times a day (BID) | INTRAMUSCULAR | Status: DC
  Administered 2014-06-11: 3 mL via INTRAVENOUS

## 2014-06-11 MED ORDER — METHYLPREDNISOLONE 4 MG PO TABS: 4.0000 mg | ORAL_TABLET | Freq: Two times a day (BID) | ORAL | Status: DC

## 2014-06-11 MED ORDER — TECHNETIUM TC 99M DIETHYLENETRIAME-PENTAACETIC ACID: 40.0000 | Freq: Once | INTRAVENOUS | Status: AC | PRN

## 2014-06-11 MED ORDER — GI COCKTAIL ~~LOC~~
30.0000 mL | Freq: Once | ORAL | Status: AC
  Administered 2014-06-11: 30 mL via ORAL
  Filled 2014-06-11: qty 30

## 2014-06-11 NOTE — ED Provider Notes (Signed)
CSN: [id number]     Arrival date & time 06/11/14  1307 History   First MD Initiated Contact with Patient 06/11/14 1315     Chief Complaint  Patient presents with  . Chest Pain     (Consider location/radiation/quality/duration/timing/severity/associated sxs/prior Treatment) The history is provided by the patient, the spouse and medical records.     Patient with hx crohn's disease recently on imuran and on steroids, also with recent surgery from left wrist infection (micobacterial infection) p/w sharp left sided chest pain with radiation into the back with associated chills, nausea, dizziness, lightheadedness, and headache that is intermittent, occuring every 2-3 hours, lasting 30-60 minutes at a time.  The symptoms seem to occur randomly, no exacerbating or palliative factors.  Has also had occasional epigastric pain, worse over the past 4 days, that does not seem to be related to the chest pain.  Is urinating frequently, every 1-2 hours, and has darker than normal output in his ileostomy.  Pt has recently been on Rifampin for his wrist infection and was having significant side effects including anorexia, dizziness, significant weight loss.  Per wife pt has been drinking fluids but with decreased appetite has not been eating much.  Is currently on steroid taper.    Past Medical History  Diagnosis Date  . Crohn's disease   . Hernia, abdominal   . GERD (gastroesophageal reflux disease)   . Anxiety   . Atypical mycobacterial infection of hand 2016    L wrist   Past Surgical History  Procedure Laterality Date  . Ileostomy    . Colonoscopy    . Upper gastrointestinal endoscopy    . Appendectomy    . Colon surgery    . Wrist surgery      right  . Ileostomy closure N/A 10/23/2013    Procedure: ILEOSTOMY TAKEDOWN, REPAIR OF PARASTOMAL HERNIA.;  Surgeon: Cherylynn Ridges, MD;  Location: [facility] OR;  Service: General;  Laterality: N/A;  . Hernia repair    . Wrist arthroscopy with debridement Left  05/24/2014    Procedure: LEFT ARTHROSCOPY WRIST CULTURE/BIOPSY DEBRIDEMENT;  Surgeon: Cindee Salt, MD;  Location: [facility];  Service: Orthopedics;  Laterality: Left;  . R wrist fracture Right 2007    screws   Family History  Problem Relation Age of Onset  . Healthy Mother   . Healthy Daughter   . Healthy Son    History  Substance Use Topics  . Smoking status: Former Smoker    Types: Cigars    Quit date: 05/31/2006  . Smokeless tobacco: Former Neurosurgeon  . Alcohol Use: No    Review of Systems  All other systems reviewed and are negative.     Allergies  Ivp dye; Fish allergy; Shellfish allergy; and Demerol  Home Medications   Prior to Admission medications   Medication Sig Start Date End Date Taking? Authorizing Provider  ALPRAZolam Prudy Feeler) 0.5 MG tablet Take 1 tablet (0.5 mg total) by mouth 2 (two) times daily as needed for anxiety. 03/13/14   Malissa Hippo, MD  Ascorbic Acid (VITAMIN C) 1000 MG tablet Take 1,000 mg by mouth daily.    Historical Provider, MD  azithromycin (ZITHROMAX) 250 MG tablet Take 1 tablet (250 mg total) by mouth daily. 05/30/14   Ginnie Smart, MD  Calcium Carbonate-Vitamin D (CALCIUM 600 + D PO) Take 1 tablet by mouth 2 (two) times daily.     Historical Provider, MD  FLUARIX QUADRIVALENT 0.5 ML injection  04/05/14  Historical Provider, MD  HYDROcodone-acetaminophen (NORCO/VICODIN) 5-325 MG per tablet Take 1-2 tablets by mouth 2 (two) times daily as needed for moderate pain. Patient taking differently: Take 1 tablet by mouth at bedtime as needed for moderate pain.  03/13/14   Malissa Hippo, MD  methylPREDNISolone (MEDROL) 4 MG tablet Take 4 mg by mouth. Decrease Medrol to 2 tabs in the AM, 3 tabs in the PM for 3 days, then 2 tabs twice a day for 3 days, then 1 tab in the AM and 2 tabs in PM for 3 days, Twice daily for 3 days, then 1 tab daily for 3 days, then stop.    Historical Provider, MD  Multiple Vitamins-Minerals (MULTIVITAMIN PO)  Take 1 tablet by mouth daily.     Historical Provider, MD  nystatin (MYCOSTATIN/NYSTOP) 100000 UNIT/GM POWD Apply topically See admin instructions. PRN 03/13/14   Historical Provider, MD  ondansetron (ZOFRAN) 4 MG tablet Take 1 tablet (4 mg total) by mouth 2 (two) times daily as needed for nausea or vomiting. 06/07/14   Malissa Hippo, MD  pantoprazole (PROTONIX) 40 MG tablet Take 1 tablet (40 mg total) by mouth 2 (two) times daily before a meal. 06/07/14   Malissa Hippo, MD  rifampin (RIFADIN) 300 MG capsule Take 1 capsule (300 mg total) by mouth daily. 05/30/14   Ginnie Smart, MD  Zinc 50 MG CAPS Take 1 capsule by mouth daily.     Historical Provider, MD   BP 120/75 mmHg  Pulse 118  Temp(Src) 98.1 F (36.7 C) (Oral)  Resp 18  Ht 5' 9.5" (1.765 m)  Wt 165 lb (74.844 kg)  BMI 24.03 kg/m2  SpO2 98% Physical Exam  Constitutional: He appears well-developed and well-nourished. No distress.  Uncomfortable appearing  HENT:  Head: Atraumatic. Macrocephalic.  Mouth/Throat: Uvula is midline. Posterior oropharyngeal erythema present. No posterior oropharyngeal edema or tonsillar abscesses.  Neck: Normal range of motion. Neck supple.  Cardiovascular: Normal rate and regular rhythm.   Pulmonary/Chest: Effort normal. No respiratory distress. He has no decreased breath sounds. He has no wheezes. He has no rhonchi. He has rales in the right lower field.  Abdominal: Soft. He exhibits no distension and no mass. There is tenderness in the epigastric area. There is no rebound and no guarding.  Colostomy with dark green liquid output  Musculoskeletal: He exhibits no edema.  Neurological: He is alert. He exhibits normal muscle tone.  Skin: He is not diaphoretic.  Nursing note and vitals reviewed.   ED Course  Procedures (including critical care time) Labs Review Labs Reviewed  CBC - Abnormal; Notable for the following:    RBC 4.00 (*)    HCT 36.3 (*)    MCHC 36.9 (*)    All other components  within normal limits  BASIC METABOLIC PANEL - Abnormal; Notable for the following:    Sodium 132 (*)    Potassium 3.2 (*)    Chloride 91 (*)    Glucose, Bld 123 (*)    Creatinine, Ser 1.39 (*)    GFR calc non Af Amer 53 (*)    GFR calc Af Amer 62 (*)    All other components within normal limits  HEPATIC FUNCTION PANEL - Abnormal; Notable for the following:    Albumin 2.9 (*)    AST 41 (*)    All other components within normal limits  D-DIMER, QUANTITATIVE - Abnormal; Notable for the following:    D-Dimer, Quant 3.89 (*)  All other components within normal limits  URINALYSIS, ROUTINE W REFLEX MICROSCOPIC - Abnormal; Notable for the following:    Color, Urine AMBER (*)    Protein, ur 30 (*)    All other components within normal limits  URINE MICROSCOPIC-ADD ON - Abnormal; Notable for the following:    Squamous Epithelial / LPF FEW (*)    Casts HYALINE CASTS (*)    All other components within normal limits  URINE CULTURE  LIPASE, BLOOD  TROPONIN I  I-STAT TROPOININ, ED  POC OCCULT BLOOD, ED    Imaging Review Dg Chest Port 1 View  06/11/2014   CLINICAL DATA:  Chest pain for 3 days  EXAM: PORTABLE CHEST - 1 VIEW  COMPARISON:  10/17/2013  FINDINGS: The heart size and mediastinal contours are within normal limits. Both lungs are clear. The visualized skeletal structures are unremarkable.  IMPRESSION: No acute findings   Electronically Signed   By: Ellery Plunk M.D.   On: 06/11/2014 13:45     EKG Interpretation   Date/Time:  Monday June 11 2014 13:17:43 EST Ventricular Rate:  121 PR Interval:  150 QRS Duration: 88 QT Interval:  331 QTC Calculation: 470 R Axis:   47 Text Interpretation:  Sinus tachycardia Abnormal R-wave progression, late  transition artifact needs repeat ekg Confirmed by Bebe Shaggy  MD, Dorinda Hill  [phone]) on 06/11/2014 1:30:03 PM       1:52 PM Dr Bebe Shaggy made aware of the patient and will also see and evaluate the patient.   MDM   Final  diagnoses:  Chest pain    Immunocompromised patient with recent wrist surgery presents with left sided chest pain with radiation to the back, associated dizziness/lightheadedness, nausea that has been intermittent x 3 days.  Arrives tachycardic, vitals otherwise normal.  CXR negative.  EKG sinus tachycardia, abnormal R-wave progression.  Repeat troponin shows prolonged QT.  Labs remarkable for renal insufficiency, electrolyte abnormalities, suspect dehydration.  First troponin is negative.  D-Dimer is markedly elevated.  Patient and wife refuse IV contrast due to prior reactions/side effects, VQ scan ordered.  Mild erythema of posterior pharynx suggests possibility of esophagitis - pt has been placed on protonix last week by his GI doctor.  Admitted to [facility] for chest pain, ACS, PE r/o.      Trixie Dredge, PA-C 06/11/14 1633  Joya Gaskins, MD 06/12/14 [phone]

## 2014-06-11 NOTE — ED Notes (Signed)
Pt reports central chest pain with radiation to back x 1 week. Described as "burning, sharp." Pt reports mild SOB, dizziness, N/V. Reports losing 15 lbs over course of one week. Pt had surgery on left hand on 1/6, area healing well. Pt denies fever/chills. Pt has hx of Chron's, ilesotomy bag in place.

## 2014-06-11 NOTE — ED Provider Notes (Signed)
Patient seen/examined in the Emergency Department in conjunction with Midlevel Provider [facility] Patient reports chest pain that radiates to his back.   Exam : awake/alert, he appears uncomfortable Plan: imaging/labs pending at this time.  Concern for acute PE given tachycardia with associated CP   Joya Gaskins, MD 06/11/14 1444

## 2014-06-11 NOTE — H&P (Signed)
History and Physical  Lee Klein:[id number] DOB: 21-Feb-1951 DOA: 06/11/2014   PCP: Lee Pandy, MD   Chief Complaint: Chest pain, dizziness  HPI:  64 year old male with a history of Crohn's disease, GERD, and reactive arthritis presents with 3 day history of chest pain, dizziness, lightheadedness. He states that the chest pain occurs at rest with nothing making it better or worse. The patient has had some dizziness when getting up from a supine or seated position. He denies any shortness of breath, coughing, hemoptysis, syncope. Notably, the patient has had nausea and vomiting almost daily for the past week. He denies any hematemesis, hematochezia, melena the patient has also been complaining of epigastric pain, anorexia, and 10 pound weight loss at least for the past 1-2 weeks. He has had subjective fevers and chills.  All this started back on 05/24/2014 when the patient had debridement of his left wrist. The patient had been suffering from synovitis and pain of his left wrist since May 2015. He had been seeing rheumatology, Dr. Nickola Klein, was concerned about the possibility of infection versus inflammatory arthritis. 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 The patient underwent debridement on 05/24/2014 performed by Dr. Merlyn Klein. Preliminary Cultures grew AFB. The patient went to see infectious disease. He was started on rifampin and azithromycin on 05/30/2014. Since that time, the patient has been complaining of persistent nausea, anorexia, weight loss, early satiety. His rifampin was stopped on 06/09/2014. However the patient's symptoms of anorexia and nausea persists.  The patient went to see his gastroenterologist, Dr. Kelvin Klein, on 06/07/2014 who increased his PPI to twice a day dosing. Regarding the patient's Crohn's disease, the patient has a colostomy most recently revised in June 2015. The patient has been on Imuran for nearly 20 years, but this was stopped 2 weeks  ago. In addition, the patient has been essentially on steroids constantly for the last 20 years. Most recently, the patient was placed on Medrol by Dr. Nickola Klein for this inflammatory arthropathy.   In the emergency department, the patient was noted to have serum creatinine 1.39, potassium 3.2, W BC 9.1, hemoglobin 13.4. EKG was sinus rhythm without any ST-T wave changes.  V/Q scan was ordered and was negative. Chest x-ray was negative. Initial troponin was negative.  Assessment/Plan: Atypical chest pain -The patient's chest pain is quite atypical--suspect esophagitis, GI etiology -Cycle troponins -EKG without any concerning ischemic changes -Place on telemetry Dehydration/acute kidney injury -Secondary to vomiting and loose stools -IV fluids -contributing to his dizziness Hyponatremia -Secondary to volume depletion Diarrhea -The patient has constant loose stools and his colostomy -In the setting of recent antibiotic use, check C. difficile PCR Nausea/vomiting -Suspect a degree of esophagitis, gastritis secondary to the patient's prolonged steroids and possible recent antibiotics  -Start the patient on liquid diet for now and advance as tolerated  -May need GI evaluation if no improvement  -Continue PPI bid -Check two-view abdominal x-ray  -lipase 39 -Right upper quadrant ultrasound to evaluate hepatobiliary system  Crohn's disease  -recently stopped imuran L-wrist AFB infx -continue home dose azithromycin -continue home dose medrol,  bid--today is last day -starting tomorrow, pt stated he will take  bid--but will continue  BID as stress dose Active Problems:   Dehydration       Past Medical History  Diagnosis Date  . Crohn's disease   . Hernia, abdominal   . GERD (gastroesophageal reflux disease)   . Anxiety   . Atypical mycobacterial infection of  hand 2016    L wrist   Past Surgical History  Procedure Laterality Date  . Ileostomy    . Colonoscopy    .  Upper gastrointestinal endoscopy    . Appendectomy    . Colon surgery    . Wrist surgery      right  . Ileostomy closure N/A 10/23/2013    Procedure: ILEOSTOMY TAKEDOWN, REPAIR OF PARASTOMAL HERNIA.;  Surgeon: Lee Ridges, MD;  Location: [facility] OR;  Service: General;  Laterality: N/A;  . Hernia repair    . Wrist arthroscopy with debridement Left 05/24/2014    Procedure: LEFT ARTHROSCOPY WRIST CULTURE/BIOPSY DEBRIDEMENT;  Surgeon: Lee Salt, MD;  Location: [facility];  Service: Orthopedics;  Laterality: Left;  . R wrist fracture Right 2007    screws   Social History:  reports that he quit smoking about 8 years ago. His smoking use included Cigars. He has quit using smokeless tobacco. He reports that he does not drink alcohol or use illicit drugs.   Family History  Problem Relation Age of Onset  . Healthy Mother   . Healthy Daughter   . Healthy Son      Allergies  Allergen Reactions  . Ivp Dye [Iodinated Diagnostic Agents]     Joint pain, nausea  . Fish Allergy Nausea And Vomiting  . Shellfish Allergy Swelling  . Demerol Rash      Prior to Admission medications   Medication Sig Start Date End Date Taking? Authorizing Provider  Acetaminophen (TYLENOL PO) Take 2 tablets by mouth daily as needed (pain).   Yes Historical Provider, MD  ALPRAZolam Prudy Feeler) 0.5 MG tablet Take 1 tablet (0.5 mg total) by mouth 2 (two) times daily as needed for anxiety. Patient taking differently: Take 0.5 mg by mouth daily as needed for anxiety.  03/13/14  Yes Malissa Hippo, MD  Ascorbic Acid (VITAMIN C) 1000 MG tablet Take 1,000 mg by mouth daily.   Yes Historical Provider, MD  azithromycin (ZITHROMAX) 250 MG tablet Take 1 tablet (250 mg total) by mouth daily. 05/30/14  Yes Ginnie Smart, MD  Calcium Carbonate-Vitamin D (CALCIUM 600 + D PO) Take 1 tablet by mouth daily.    Yes Historical Provider, MD  FLUARIX QUADRIVALENT 0.5 ML injection  04/05/14  Yes Historical Provider, MD    fluticasone (FLONASE) 50 MCG/ACT nasal spray Place 2 sprays into both nostrils daily as needed for allergies or rhinitis.  06/05/14  Yes Historical Provider, MD  HYDROcodone-acetaminophen (NORCO/VICODIN) 5-325 MG per tablet Take 1-2 tablets by mouth 2 (two) times daily as needed for moderate pain. Patient taking differently: Take 0.5 tablets by mouth at bedtime as needed for moderate pain.  03/13/14  Yes Malissa Hippo, MD  loperamide (IMODIUM A-D) 2 MG tablet Take 8 mg by mouth 2 (two) times daily.    Yes Historical Provider, MD  methylPREDNISolone (MEDROL) 4 MG tablet Take 4 mg by mouth. Decrease Medrol to 2 tabs in the AM, 3 tabs in the PM for 3 days, then 2 tabs twice a day for 3 days, then 1 tab in the AM and 2 tabs in PM for 3 days, Twice daily for 3 days, then 1 tab daily for 3 days, then stop.   Yes Historical Provider, MD  Multiple Vitamin (THERA) TABS Take 1 tablet by mouth daily.   Yes Historical Provider, MD  Multiple Vitamins-Minerals (MULTIVITAMIN PO) Take 1 tablet by mouth daily.    Yes Historical Provider, MD  nystatin (  MYCOSTATIN/NYSTOP) 100000 UNIT/GM POWD Apply topically See admin instructions. PRN 03/13/14  Yes Historical Provider, MD  ondansetron (ZOFRAN) 4 MG tablet Take 1 tablet (4 mg total) by mouth 2 (two) times daily as needed for nausea or vomiting. 06/07/14  Yes Malissa Hippo, MD  pantoprazole (PROTONIX) 40 MG tablet Take 1 tablet (40 mg total) by mouth 2 (two) times daily before a meal. 06/07/14  Yes Malissa Hippo, MD  Zinc 50 MG CAPS Take 1 capsule by mouth daily.    Yes Historical Provider, MD  rifampin (RIFADIN) 300 MG capsule Take 1 capsule (300 mg total) by mouth daily. 05/30/14   Ginnie Smart, MD    Review of Systems:  Constitutional:  No weight loss, night sweats, Head&Eyes: No headache.  No vision loss.  No eye pain or scotoma ENT:  No Difficulty swallowing,Tooth/dental problems,Sore throat,   Cardio-vascular:  No Orthopnea, PND, swelling in lower  extremities,  dizziness, palpitations  GI:  No  abdominal pain, hematochezia, melena, heartburn, indigestion, Resp:  No shortness of breath with exertion or at rest. No cough. No coughing up of blood .No wheezing.No chest wall deformity  Skin:  no rash or lesions.  GU:  no dysuria, change in color of urine, no urgency or frequency. No flank pain.  Musculoskeletal:  No joint pain or swelling. No decreased range of motion. No back pain.  Psych:  No change in mood or affect. Neurologic: No headache, no dysesthesia, no focal weakness, no vision loss. No syncope  Physical Exam: Filed Vitals:   06/11/14 1430 06/11/14 1500 06/11/14 1530 06/11/14 1650  BP: 101/68 116/72 109/70 113/72  Pulse: 107 100 101   Temp:      TempSrc:      Resp: 21 13 20 17   Height:      Weight:      SpO2: 100% 100% 96% 97%   General:  A&O x 3, NAD, nontoxic, pleasant/cooperative Head/Eye: No conjunctival hemorrhage, no icterus, [place]/AT, No nystagmus ENT:  No icterus,  No thrush, good dentition, no pharyngeal exudate Neck:  No masses, no lymphadenpathy, no bruits CV:  RRR, no rub, no gallop, no S3 Lung:  CTAB, good air movement, no wheeze, no rhonchi Abdomen: soft/epigastric pain without rebound, +BS, nondistended, no peritoneal signs Ext: No cyanosis, No rashes, No petechiae, No lymphangitis, No edema   Labs on Admission:  Basic Metabolic Panel:  Recent Labs Lab 06/11/14 1326  NA 132*  K 3.2*  CL 91*  CO2 27  GLUCOSE 123*  BUN 12  CREATININE 1.39*  CALCIUM 8.9   Liver Function Tests:  Recent Labs Lab 06/11/14 1327  AST 41*  ALT 34  ALKPHOS 86  BILITOT 0.8  PROT 6.2  ALBUMIN 2.9*    Recent Labs Lab 06/11/14 1327  LIPASE 39   No results for input(s): AMMONIA in the last 168 hours. CBC:  Recent Labs Lab 06/11/14 1326  WBC 9.1  HGB 13.4  HCT 36.3*  MCV 90.8  PLT 237   Cardiac Enzymes:  Recent Labs Lab 06/11/14 1327  TROPONINI <0.03   BNP: Invalid input(s):  POCBNP CBG: No results for input(s): GLUCAP in the last 168 hours.  Radiological Exams on Admission: Nm Pulmonary Perf And Vent  06/11/2014   CLINICAL DATA:  Chest pain.  EXAM: NUCLEAR MEDICINE VENTILATION - PERFUSION LUNG SCAN  TECHNIQUE: Ventilation images were obtained in multiple projections using inhaled aerosol technetium 99 M DTPA. Perfusion images were obtained in multiple projections after intravenous injection  of Tc-34m MAA.  RADIOPHARMACEUTICALS:  40.0 mCi Tc-63m DTPA aerosol and 6.0 mCi Tc-70m MAA  COMPARISON:  Chest radiograph of 06/11/2014  FINDINGS: Ventilation: Heterogeneous ventilation, without focal defect.  Perfusion: No wedge shaped peripheral perfusion defects to suggest acute pulmonary embolism.  IMPRESSION: No evidence of pulmonary embolism.   Electronically Signed   By: Jeronimo Greaves M.D.   On: 06/11/2014 16:51   Dg Chest Port 1 View  06/11/2014   CLINICAL DATA:  Chest pain for 3 days  EXAM: PORTABLE CHEST - 1 VIEW  COMPARISON:  10/17/2013  FINDINGS: The heart size and mediastinal contours are within normal limits. Both lungs are clear. The visualized skeletal structures are unremarkable.  IMPRESSION: No acute findings   Electronically Signed   By: Ellery Plunk M.D.   On: 06/11/2014 13:45    EKG: Independently reviewed. Sinus rhythm, no ST-T wave changes    Time spent:70 minutes Code Status:   FULL Family Communication:   Family at bedside   Dann Galicia, DO  [facility] Pager [phone]  If 7PM-7AM, please contact night-coverage [email or web address] Password [facility] 06/11/2014, 5:41 PM

## 2014-06-12 DIAGNOSIS — R072 Precordial pain: Secondary | ICD-10-CM

## 2014-06-12 DIAGNOSIS — R197 Diarrhea, unspecified: Secondary | ICD-10-CM

## 2014-06-12 LAB — URINE CULTURE
CULTURE: NO GROWTH
Colony Count: NO GROWTH

## 2014-06-12 LAB — BASIC METABOLIC PANEL
Anion gap: 8 (ref 5–15)
BUN: 8 mg/dL (ref 6–23)
CALCIUM: 8 mg/dL — AB (ref 8.4–10.5)
CHLORIDE: 98 mmol/L (ref 96–112)
CO2: 27 mmol/L (ref 19–32)
CREATININE: 1.18 mg/dL (ref 0.50–1.35)
GFR calc Af Amer: 75 mL/min — ABNORMAL LOW (ref >90)
GFR, EST NON AFRICAN AMERICAN: 65 mL/min — AB (ref >90)
Glucose, Bld: 115 mg/dL — ABNORMAL HIGH (ref 70–99)
Potassium: 3.2 mmol/L — ABNORMAL LOW (ref 3.5–5.1)
Sodium: 133 mmol/L — ABNORMAL LOW (ref 135–145)

## 2014-06-12 LAB — VITAMIN B12: VITAMIN B 12: 604 pg/mL (ref 211–911)

## 2014-06-12 LAB — CBC
HCT: 25.6 % — ABNORMAL LOW (ref 39.0–52.0)
HEMOGLOBIN: 10.9 g/dL — AB (ref 13.0–17.0)
MCH: 42.6 pg — ABNORMAL HIGH (ref 26.0–34.0)
MCHC: 42.6 g/dL — AB (ref 30.0–36.0)
MCV: 100 fL (ref 78.0–100.0)
PLATELETS: 236 10*3/uL (ref 150–400)
RBC: 2.56 MIL/uL — AB (ref 4.22–5.81)
RDW: 16.6 % — AB (ref 11.5–15.5)
WBC: 6.9 10*3/uL (ref 4.0–10.5)

## 2014-06-12 LAB — IRON AND TIBC
Iron: 49 ug/dL (ref 42–165)
SATURATION RATIOS: 23 % (ref 20–55)
TIBC: 210 ug/dL — ABNORMAL LOW (ref 215–435)
UIBC: 161 ug/dL (ref 125–400)

## 2014-06-12 LAB — RETICULOCYTES
RBC.: 3.1 MIL/uL — ABNORMAL LOW (ref 4.22–5.81)
RETIC COUNT ABSOLUTE: 80.6 10*3/uL (ref 19.0–186.0)
Retic Ct Pct: 2.6 % (ref 0.4–3.1)

## 2014-06-12 LAB — FOLATE: Folate: >20 ng/mL

## 2014-06-12 LAB — CLOSTRIDIUM DIFFICILE BY PCR: Toxigenic C. Difficile by PCR: NEGATIVE

## 2014-06-12 LAB — MAGNESIUM: Magnesium: 1.5 mg/dL (ref 1.5–2.5)

## 2014-06-12 LAB — OCCULT BLOOD X 1 CARD TO LAB, STOOL: Fecal Occult Bld: NEGATIVE

## 2014-06-12 LAB — TROPONIN I: Troponin I: <0.03 ng/mL (ref <0.031)

## 2014-06-12 LAB — FERRITIN: FERRITIN: 860 ng/mL — AB (ref 22–322)

## 2014-06-12 MED ORDER — GI COCKTAIL ~~LOC~~
30.0000 mL | Freq: Two times a day (BID) | ORAL | Status: DC | PRN
  Administered 2014-06-12 – 2014-06-13 (×2): 30 mL via ORAL
  Filled 2014-06-12 (×3): qty 30

## 2014-06-12 MED ORDER — PANTOPRAZOLE SODIUM 40 MG PO TBEC
40.0000 mg | DELAYED_RELEASE_TABLET | Freq: Two times a day (BID) | ORAL | Status: DC
  Administered 2014-06-13: 40 mg via ORAL
  Filled 2014-06-12: qty 1

## 2014-06-12 MED ORDER — LOPERAMIDE HCL 2 MG PO CAPS
2.0000 mg | ORAL_CAPSULE | ORAL | Status: DC | PRN
  Administered 2014-06-12: 2 mg via ORAL
  Administered 2014-06-13: 8 mg via ORAL
  Filled 2014-06-12: qty 1

## 2014-06-12 MED ORDER — KETOROLAC TROMETHAMINE 15 MG/ML IJ SOLN
15.0000 mg | Freq: Once | INTRAMUSCULAR | Status: DC
  Filled 2014-06-12: qty 1

## 2014-06-12 MED ORDER — FLUCONAZOLE 200 MG PO TABS
200.0000 mg | ORAL_TABLET | Freq: Every day | ORAL | Status: DC
Start: 2014-06-12 — End: 2014-06-13
  Administered 2014-06-12 – 2014-06-13 (×2): 200 mg via ORAL
  Filled 2014-06-12 (×2): qty 1

## 2014-06-12 MED ORDER — ENSURE COMPLETE PO LIQD
237.0000 mL | Freq: Two times a day (BID) | ORAL | Status: DC
  Administered 2014-06-12 – 2014-06-13 (×3): 237 mL via ORAL

## 2014-06-12 MED ORDER — FAMOTIDINE 40 MG PO TABS
40.0000 mg | ORAL_TABLET | Freq: Every day | ORAL | Status: DC
  Administered 2014-06-12: 40 mg via ORAL
  Filled 2014-06-12 (×2): qty 1

## 2014-06-12 MED ORDER — POTASSIUM CHLORIDE CRYS ER 20 MEQ PO TBCR
40.0000 meq | EXTENDED_RELEASE_TABLET | Freq: Once | ORAL | Status: AC
  Administered 2014-06-12: 40 meq via ORAL
  Filled 2014-06-12: qty 2

## 2014-06-12 MED ORDER — METHYLPREDNISOLONE 4 MG PO TABS
4.0000 mg | ORAL_TABLET | Freq: Every day | ORAL | Status: DC
  Administered 2014-06-13: 4 mg via ORAL
  Filled 2014-06-12: qty 1

## 2014-06-12 MED ORDER — OXYCODONE-ACETAMINOPHEN 5-325 MG PO TABS
1.0000 | ORAL_TABLET | Freq: Four times a day (QID) | ORAL | Status: DC | PRN
  Administered 2014-06-12 – 2014-06-13 (×3): 1 via ORAL
  Filled 2014-06-12 (×3): qty 1

## 2014-06-12 MED ORDER — MAGNESIUM SULFATE 2 GM/50ML IV SOLN
2.0000 g | Freq: Once | INTRAVENOUS | Status: AC
  Administered 2014-06-12: 2 g via INTRAVENOUS
  Filled 2014-06-12: qty 50

## 2014-06-12 NOTE — Progress Notes (Signed)
[facility] PROGRESS NOTE  Lee Klein WNI:[id number] DOB: 13-Jan-1951 DOA: 06/11/2014 PCP: [name], MD Interim summary: 64 year old male with prior h/o GERD, crohn's disease on Imuran, recently had a left wrist swelling. He was seen by rheumatologist and started on high dose steroids. He was later referred to hand surgeon and was found to have left wrist  Erosive sinovitis with ligament tear and swelling, he underwent arthroscopy of the left wrist on the 7 th of January and cultures revealed acid fast bacilli. identification and sensitivities are still pending. He was further seen by Dr Ninetta Lights and started on rifampin and zithromax. Patient reports having nausea, vomiting epigastric discomfort, burning sensation sub sternally, since he started the steroids and worsened with antibiotics. His rifampin was stopped on 1/23. He was asked to resume zithromax till the cultures come back. He saw Dr Karilyn Cota a week ago and was prescribed protonix twice daily. He reports it did not improve his symptoms and he was supposed to go back and see Dr Karilyn Cota yesterday, but came to ED instead. He was admitted for chest pain, substernal pain and burning sensation , which is relived by gi cocktail.  He denies chest ain right now. Gastroenterology consulted to see if he needs an EGD.    Assessment/Plan:  ATYPICAL CHEST PAIN: Admitted to telemetry. EKG does not show any ischemic changes and serial troponins are negative.  We will get an echocardiogram. But suspect this is gastritis probably from steroids.    Erosive synovitis: On zithromax for now. Cultures from the left wrist are pending.     Acute renal failure : resolved with hydration. Probably from dehydyration.    Anemia: Normocytic, get stool for occult blood. His hemoglobin is 14 at baseline. Anemia panel also sent. We will hold the lovenox still the stool for occult blood comes back negative.  On IV PPI. Gastroenterology consulted.  GI  cocktail.    Nausea, vomiting and diarrhea: Improved, probably from the antibiotic.  c diff pcr is negative.  imodium as needed  Abdominal discomfort, nausea, substernal burning pain: Probably from gastritis.  Recommend doing a quick taper of steroids  GI consulted to see if he needs an EGD.   Crohn's disease: Recently stopped imuran. As per Dr Queen Slough recommendations, to continue prednisone 5 mg daily after this acute episode is over. .    Hypokalemia: replete as needed.     Code Status: full code.  Family Communication: wife at bedside Disposition Plan: pending    Consultants:  gastroenterology  Procedures:  none  Antibiotics:  Zithromax for the erosive synovitis.   HPI/Subjective: Nausea, improved. Diarrhea improved.   Objective: Filed Vitals:   06/12/14 0641  BP: 107/73  Pulse: 93  Temp: 98.1 F (36.7 C)  Resp: 18    Intake/Output Summary (Last 24 hours) at 06/12/14 1109 Last data filed at 06/12/14 0800  Gross per 24 hour  Intake    240 ml  Output    100 ml  Net    140 ml   Filed Weights   06/11/14 1318  Weight: 74.844 kg (165 lb)    Exam:   General:  Alert afebrile comfortable  Cardiovascular: s1s2  Respiratory: chest clear to ausculation, no wheezing or rhonchi  Abdomen: soft mild tenderness in the epigastric area, bowel sounds heard  Musculoskeletal: no pedal edema.   Data Reviewed: Basic Metabolic Panel:  Recent Labs Lab 06/11/14 1326 06/12/14 0107  NA 132* 133*  K 3.2* 3.2*  CL 91* 98  CO2 27 27  GLUCOSE 123* 115*  BUN 12 8  CREATININE 1.39* 1.18  CALCIUM 8.9 8.0*  MG  --  1.5   Liver Function Tests:  Recent Labs Lab 06/11/14 1327  AST 41*  ALT 34  ALKPHOS 86  BILITOT 0.8  PROT 6.2  ALBUMIN 2.9*    Recent Labs Lab 06/11/14 1327  LIPASE 39   No results for input(s): AMMONIA in the last 168 hours. CBC:  Recent Labs Lab 06/11/14 1326 06/12/14 0107  WBC 9.1 6.9  HGB 13.4 10.9*  HCT 36.3*  25.6*  MCV 90.8 100.0  PLT 237 236   Cardiac Enzymes:  Recent Labs Lab 06/11/14 1327 06/11/14 1936 06/12/14 0107  TROPONINI <0.03 <0.03 <0.03   BNP (last 3 results) No results for input(s): PROBNP in the last 8760 hours. CBG: No results for input(s): GLUCAP in the last 168 hours.  Recent Results (from the past 240 hour(s))  Clostridium Difficile by PCR     Status: None   Collection Time: 06/11/14  6:49 PM  Result Value Ref Range Status   C difficile by pcr NEGATIVE NEGATIVE Final     Studies: Nm Pulmonary Perf And Vent  06/11/2014   CLINICAL DATA:  Chest pain.  EXAM: NUCLEAR MEDICINE VENTILATION - PERFUSION LUNG SCAN  TECHNIQUE: Ventilation images were obtained in multiple projections using inhaled aerosol technetium 99 M DTPA. Perfusion images were obtained in multiple projections after intravenous injection of Tc-82m MAA.  RADIOPHARMACEUTICALS:  40.0 mCi Tc-27m DTPA aerosol and 6.0 mCi Tc-65m MAA  COMPARISON:  Chest radiograph of 06/11/2014  FINDINGS: Ventilation: Heterogeneous ventilation, without focal defect.  Perfusion: No wedge shaped peripheral perfusion defects to suggest acute pulmonary embolism.  IMPRESSION: No evidence of pulmonary embolism.   Electronically Signed   By: Jeronimo Greaves M.D.   On: 06/11/2014 16:51   Dg Chest Port 1 View  06/11/2014   CLINICAL DATA:  Chest pain for 3 days  EXAM: PORTABLE CHEST - 1 VIEW  COMPARISON:  10/17/2013  FINDINGS: The heart size and mediastinal contours are within normal limits. Both lungs are clear. The visualized skeletal structures are unremarkable.  IMPRESSION: No acute findings   Electronically Signed   By: Ellery Plunk M.D.   On: 06/11/2014 13:45   Dg Abd 2 Views  06/11/2014   CLINICAL DATA:  Chest and abdomen pain with vomiting. No appetite. History of Crohn disease.  EXAM: ABDOMEN - 2 VIEW  COMPARISON:  Abdomen 09/17/2013, CT abdomen and pelvis 09/16/2013.  FINDINGS: Right lower quadrant ostomy with soft tissue bulging  superior to the ostomy site consistent with herniation as seen on prior studies. Paucity of gas in the abdomen. No gaseous distention of any bowel loops is demonstrated. Dilated fluid-filled loops are not excluded. No free intra-abdominal air. No abnormal air-fluid levels. Surgical clips in the right upper quadrant and left upper quadrant with bowel staples in the pelvis. Metallic foreign body in the left upper quadrant similar prior study. Degenerative changes in the spine and hips.  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 Paucity of gas in the abdomen. No gaseous distention of bowel loops to suggest obstruction.   Electronically Signed   By: Burman Nieves M.D.   On: 06/11/2014 23:54   US Abdomen Limited Ruq  06/12/2014   CLINICAL DATA:  Acute onset of vomiting.  Initial encounter.  EXAM: US ABDOMEN LIMITED - RIGHT UPPER QUADRANT  COMPARISON:  CT of the abdomen and pelvis from 09/16/2013  FINDINGS: Gallbladder:  Status post cholecystectomy.  No retained stones seen.  Common bile duct:  Diameter: 0.4 cm, within normal limits in caliber.  Liver:  No focal lesion identified. Within normal limits in parenchymal echogenicity.  IMPRESSION: Unremarkable ultrasound of the right upper quadrant. Status post cholecystectomy.   Electronically Signed   By: Roanna Raider M.D.   On: 06/12/2014 02:40    Scheduled Meds: . aspirin EC  325 mg Oral Daily  . azithromycin  250 mg Oral Daily  . enoxaparin (LOVENOX) injection  40 mg Subcutaneous Q24H  . feeding supplement (ENSURE COMPLETE)  237 mL Oral BID BM  . ketorolac  15 mg Intravenous Once  . magnesium sulfate 1 - 4 g bolus IVPB  2 g Intravenous Once  . methylPREDNISolone  4 mg Oral QAC breakfast   And  . methylPREDNISolone  8 mg Oral Q supper  . [START ON 06/15/2014] methylPREDNISolone  4 mg Oral BID WC   Followed by  . [START ON 06/18/2014] methylPREDNISolone  4 mg Oral QAC breakfast  . multivitamin with minerals  1 tablet Oral  Daily  . pantoprazole (PROTONIX) IV  40 mg Intravenous Q12H  . sodium chloride  3 mL Intravenous Q12H  . vitamin C  1,000 mg Oral Daily   Continuous Infusions: . sodium chloride 0.9 % 1,000 mL with potassium chloride 20 mEq infusion 75 mL/hr at 06/12/14 1017    Active Problems:   Crohn's disease   Diarrhea   Atypical mycobacterial infection of hand   Dehydration   Atypical chest pain   AKI (acute kidney injury)   Hyponatremia    Time spent: 35 min    Deago Burruss  [facility] Pager [phone] If 7PM-7AM, please contact night-coverage at [email or web address], password [facility] 06/12/2014, 11:09 AM  LOS: 1 day

## 2014-06-12 NOTE — Progress Notes (Signed)
UR Completed.  [phone]  

## 2014-06-12 NOTE — Progress Notes (Signed)
Nutrition Brief Note  Patient identified on the Malnutrition Screening Tool (MST) Report  Wt Readings from Last 15 Encounters:  06/11/14 165 lb (74.844 kg)  06/07/14 162 lb 8 oz (73.71 kg)  05/30/14 172 lb (78.019 kg)  05/24/14 174 lb (78.926 kg)  03/13/14 173 lb 3.2 oz (78.563 kg)  11/21/13 168 lb 3.2 oz (76.295 kg)  11/10/13 168 lb 12.8 oz (76.567 kg)  11/07/13 168 lb (76.204 kg)  10/24/13 174 lb (78.926 kg)  10/03/13 175 lb (79.379 kg)  09/16/13 172 lb 6.4 oz (78.2 kg)  06/21/13 179 lb 9.6 oz (81.466 kg)  12/15/12 171 lb 3.2 oz (77.656 kg)  06/16/12 170 lb 12.8 oz (77.474 kg)  12/15/11 168 lb 14.4 oz (76.613 kg)    Body mass index is 24.03 kg/(m^2). Patient meets criteria for Normal based on current BMI.   Current diet order is Clear Liquid, patient is consuming approximately 100% of meals and supplements at this time. Labs and medications reviewed. Pt had acute episode of nausea/vomiting for past 9 days.  He does not feel like he has lost weight and reports his appetite was normal before onset of symptoms.  He is back to eating normally (he reports he is hungry) and enjoys Ensure Complete protein supplements.    No nutrition interventions warranted at this time. If nutrition issues arise, please consult RD.   Magdalen Spatz MS Dietetic Intern Pager Number [phone]

## 2014-06-12 NOTE — Progress Notes (Signed)
*  PRELIMINARY RESULTS* Echocardiogram 2D Echocardiogram has been performed.  Jeryl Columbia 06/12/2014, 2:15 PM

## 2014-06-13 DIAGNOSIS — K50919 Crohn's disease, unspecified, with unspecified complications: Secondary | ICD-10-CM

## 2014-06-13 LAB — CBC
HCT: 35 % — ABNORMAL LOW (ref 39.0–52.0)
Hemoglobin: 11.8 g/dL — ABNORMAL LOW (ref 13.0–17.0)
MCH: 32.1 pg (ref 26.0–34.0)
MCHC: 33.7 g/dL (ref 30.0–36.0)
MCV: 95.1 fL (ref 78.0–100.0)
Platelets: 220 10*3/uL (ref 150–400)
RBC: 3.68 MIL/uL — AB (ref 4.22–5.81)
RDW: 15.7 % — AB (ref 11.5–15.5)
WBC: 4.8 10*3/uL (ref 4.0–10.5)

## 2014-06-13 LAB — BASIC METABOLIC PANEL
Anion gap: <5 — ABNORMAL LOW (ref 5–15)
BUN: 8 mg/dL (ref 6–23)
CO2: 27 mmol/L (ref 19–32)
CREATININE: 1.04 mg/dL (ref 0.50–1.35)
Calcium: 7.7 mg/dL — ABNORMAL LOW (ref 8.4–10.5)
Chloride: 105 mmol/L (ref 96–112)
GFR calc non Af Amer: 76 mL/min — ABNORMAL LOW (ref >90)
GFR, EST AFRICAN AMERICAN: 88 mL/min — AB (ref >90)
GLUCOSE: 93 mg/dL (ref 70–99)
Potassium: 4 mmol/L (ref 3.5–5.1)
SODIUM: 134 mmol/L — AB (ref 135–145)

## 2014-06-13 MED ORDER — FLUCONAZOLE 200 MG PO TABS: 200.0000 mg | ORAL_TABLET | Freq: Every day | ORAL | Status: DC

## 2014-06-13 MED ORDER — ENSURE COMPLETE PO LIQD: 237.0000 mL | Freq: Two times a day (BID) | ORAL | Status: DC

## 2014-06-13 MED ORDER — ASPIRIN EC 81 MG PO TBEC: 81.0000 mg | DELAYED_RELEASE_TABLET | Freq: Every day | ORAL | Status: DC

## 2014-06-13 NOTE — Progress Notes (Signed)
Pt discharged home with wife Discharge instructions given & reviewed Education discussed  IV dc'd  Tele dc'd  Pt discharged via wheelchair with RN  All pt belongs at side.  Louie Bun S 1:54 PM

## 2014-06-13 NOTE — Discharge Summary (Signed)
Physician Discharge Summary  Lee Klein ZOX:[id number] DOB: 18-Nov-1950 DOA: 06/11/2014  PCP: [name], MD  Admit date: 06/11/2014 Discharge date: 06/13/2014  Time spent: 45 minutes  Recommendations for Outpatient Follow-up:  Patient will be discharged to home. Patient will need to follow-up with Dr. Ninetta Lights, infectious disease, as well as Dr. Karilyn Cota, gastroenterology upon discharge. Patient should continue his medications as prescribed. Patient may resume activity as tolerated. Patient currently on full liquid diet until seen by his gastroenterologist on Friday, 06/15/2014.  Discharge Diagnoses:  Atypical chest pain Erosive tenosynovitis, left wrist Acute renal failure Normocytic anemia Nausea vomiting diarrhea Abdominal discomfort/substernal burning pain Crohn's disease Hypokalemia  Discharge Condition: Stable  Diet recommendation: Full liquid diet (until seen by Dr. Karilyn Cota)  [facility] Weights   06/11/14 1318  Weight: 74.844 kg (165 lb)    History of present illness:  By Dr. Onalee Hua Tat on 06/11/2014 64 year old male with a history of Crohn's disease, GERD, and reactive arthritis presents with 3 day history of chest pain, dizziness, lightheadedness. He states that the chest pain occurs at rest with nothing making it better or worse. The patient has had some dizziness when getting up from a supine or seated position. He denies any shortness of breath, coughing, hemoptysis, syncope. Notably, the patient has had nausea and vomiting almost daily for the past week. He denies any hematemesis, hematochezia, melena the patient has also been complaining of epigastric pain, anorexia, and 10 pound weight loss at least for the past 1-2 weeks. He has had subjective fevers and chills.  All this started back on 05/24/2014 when the patient had debridement of his left wrist. The patient had been suffering from synovitis and pain of his left wrist since May 2015. He had been seeing rheumatology, Dr.  [name], was concerned about the possibility of infection versus inflammatory arthritis. 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 The patient underwent debridement on 05/24/2014 performed by Dr. [name]. Preliminary Cultures grew AFB. The patient went to see infectious disease. He was started on rifampin and azithromycin on 05/30/2014. Since that time, the patient has been complaining of persistent nausea, anorexia, weight loss, early satiety. His rifampin was stopped on 06/09/2014. However the patient's symptoms of anorexia and nausea persists. The patient went to see his gastroenterologist, Dr. [name], on 06/07/2014 who increased his PPI to twice a day dosing. Regarding the patient's Crohn's disease, the patient has a colostomy most recently revised in June 2015. The patient has been on Imuran for nearly 20 years, but this was stopped 2 weeks ago. In addition, the patient has been essentially on steroids constantly for the last 20 years. Most recently, the patient was placed on Medrol by Dr. [name] for this inflammatory arthropathy.  In the emergency department, the patient was noted to have serum creatinine 1.39, potassium 3.2, W BC 9.1, hemoglobin 13.4. EKG was sinus rhythm without any ST-T wave changes. V/Q scan was ordered and was negative. Chest x-ray was negative. Initial troponin was negative.   Hospital Course:  Atypical Chest Pain  -Troponins cycled and negative, EKG showed no ischemic changes -Echocardiogram: EF of 50-55%, grade 1 diastolic dysfunction -Patient currently chest pain-free -VQ scan also negative for pulmonary embolism -Possibly related to his esophagitis -Spoke with Dr. Karilyn Cota, Gastroenterologist, who will see the patient on 1/29 for EGD  Erosive tenosynovitis, left wrist -Patient has AFB in past cultures -Spoke with Dr. Ninetta Lights, ID, who stated do not continue Rifampin, only azithromycin. Patient is to followup with Dr. Ninetta Lights for  decision regarding dual  therapy -Continue Azithromycin   Acute Renal failure -Resolved, Likely secondary to dehydration  Normocytic Anemia -Anemia panel shows adequate iron stores -Hb stable (drop to 10.9 likely dilutional) -Hb today 11.8  Nausea, vomiting, Diarrhea -Resolved -Cdiff PCR negative -Right upper quadrant ultrasound: Unremarkable, status post cholecystectomy -Likely secondary to antibiotics  Abdominal discomfort/substernal burning pain -Likely esophagitis or gastritis -Patient will see gastroenterology for EGD as an outpatient  Crohn's disease -Patient no longer on Imuran -Continue medrol taper as per GI recommendation  Hypokalemia -Resolved  Procedures: None  Consultations: Infectious disease, Dr. Ninetta Lights, via phone Gastroenterology, Dr. Karilyn Cota, via phone  Discharge Exam: Filed Vitals:   06/13/14 0418  BP: 104/72  Pulse: 88  Temp: 98 F (36.7 C)  Resp: 18     General: Well developed, well nourished, NAD, appears stated age  HEENT: NCAT, mucous membranes moist.  Cardiovascular: S1 S2 auscultated, no rubs, murmurs or gallops. Regular rate and rhythm.  Respiratory: Clear to auscultation bilaterally with equal chest rise  Abdomen: Soft, nontender, nondistended, + bowel sounds  Extremities: warm dry without cyanosis clubbing or edema. Left wrist wrapped   Neuro: AAOx3, nonfocal  Psych: Normal affect and demeanor with intact judgement and insight  Discharge Instructions      Discharge Instructions    Discharge instructions    Complete by:  As directed   Patient will be discharged to home. Patient will need to follow-up with Dr. Ninetta Lights, infectious disease, as well as Dr. Karilyn Cota, gastroenterology upon discharge. Patient should continue his medications as prescribed. Patient may resume activity as tolerated. Patient currently on full liquid diet until seen by his gastroenterologist on Friday, 06/15/2014.            Medication List    STOP taking these  medications        FLUARIX QUADRIVALENT 0.5 ML injection  Generic drug:  Influenza vac split quadrivalent PF     rifampin 300 MG capsule  Commonly known as:  RIFADIN      TAKE these medications        ALPRAZolam 0.5 MG tablet  Commonly known as:  XANAX  Take 1 tablet (0.5 mg total) by mouth 2 (two) times daily as needed for anxiety.     aspirin EC 81 MG tablet  Take 1 tablet (81 mg total) by mouth daily.     azithromycin 250 MG tablet  Commonly known as:  ZITHROMAX  Take 1 tablet (250 mg total) by mouth daily.     CALCIUM 600 + D PO  Take 1 tablet by mouth daily.     feeding supplement (ENSURE COMPLETE) Liqd  Take 237 mLs by mouth 2 (two) times daily between meals.     fluconazole 200 MG tablet  Commonly known as:  DIFLUCAN  Take 1 tablet (200 mg total) by mouth daily.     fluticasone 50 MCG/ACT nasal spray  Commonly known as:  FLONASE  Place 2 sprays into both nostrils daily as needed for allergies or rhinitis.     HYDROcodone-acetaminophen 5-325 MG per tablet  Commonly known as:  NORCO/VICODIN  Take 1-2 tablets by mouth 2 (two) times daily as needed for moderate pain.     loperamide 2 MG tablet  Commonly known as:  IMODIUM A-D  Take 8 mg by mouth 2 (two) times daily.     methylPREDNISolone 4 MG tablet  Commonly known as:  MEDROL  Take 4 mg by mouth. Decrease Medrol to 2 tabs in the  AM, 3 tabs in the PM for 3 days, then 2 tabs twice a day for 3 days, then 1 tab in the AM and 2 tabs in PM for 3 days, Twice daily for 3 days, then 1 tab daily for 3 days, then stop.     MULTIVITAMIN PO  Take 1 tablet by mouth daily.     nystatin 100000 UNIT/GM Powd  Apply topically See admin instructions. PRN     ondansetron 4 MG tablet  Commonly known as:  ZOFRAN  Take 1 tablet (4 mg total) by mouth 2 (two) times daily as needed for nausea or vomiting.     pantoprazole 40 MG tablet  Commonly known as:  PROTONIX  Take 1 tablet (40 mg total) by mouth 2 (two) times daily before  a meal.     THERA Tabs  Take 1 tablet by mouth daily.     TYLENOL PO  Take 2 tablets by mouth daily as needed (pain).     vitamin C 1000 MG tablet  Take 1,000 mg by mouth daily.     Zinc 50 MG Caps  Take 1 capsule by mouth daily.       Allergies  Allergen Reactions  . Ivp Dye [Iodinated Diagnostic Agents]     Joint pain, nausea  . Fish Allergy Nausea And Vomiting  . Shellfish Allergy Swelling  . Demerol Rash   Follow-up Information    Follow up with [name], MD. Schedule an appointment as soon as possible for a visit in 1 week.   Specialty:  Family Medicine   Why:  Hospital followup   Contact information:   [address] [place] [address] [phone]       Follow up with Johny Sax, MD. Schedule an appointment as soon as possible for a visit in 1 week.   Specialty:  Infectious Diseases   Why:  Hospital followup   Contact information:   [address] [place] [address] [phone]       Follow up with Malissa Hippo, MD. Call on 06/15/2014.   Specialty:  Gastroenterology   Why:  EGD   Contact information:   [address], [address] [place] [address] [phone]        The results of significant diagnostics from this hospitalization (including imaging, microbiology, ancillary and laboratory) are listed below for reference.    Significant Diagnostic Studies: Nm Pulmonary Perf And Vent  06/11/2014   CLINICAL DATA:  Chest pain.  EXAM: NUCLEAR MEDICINE VENTILATION - PERFUSION LUNG SCAN  TECHNIQUE: Ventilation images were obtained in multiple projections using inhaled aerosol technetium 99 M DTPA. Perfusion images were obtained in multiple projections after intravenous injection of Tc-60m MAA.  RADIOPHARMACEUTICALS:  40.0 mCi Tc-32m DTPA aerosol and 6.0 mCi Tc-52m MAA  COMPARISON:  Chest radiograph of 06/11/2014  FINDINGS: Ventilation: Heterogeneous ventilation, without focal defect.  Perfusion: No wedge shaped peripheral perfusion  defects to suggest acute pulmonary embolism.  IMPRESSION: No evidence of pulmonary embolism.   Electronically Signed   By: Jeronimo Greaves M.D.   On: 06/11/2014 16:51   Dg Chest Port 1 View  06/11/2014   CLINICAL DATA:  Chest pain for 3 days  EXAM: PORTABLE CHEST - 1 VIEW  COMPARISON:  10/17/2013  FINDINGS: The heart size and mediastinal contours are within normal limits. Both lungs are clear. The visualized skeletal structures are unremarkable.  IMPRESSION: No acute findings   Electronically Signed   By: Ellery Plunk  M.D.   On: 06/11/2014 13:45   Dg Abd 2 Views  06/11/2014   CLINICAL DATA:  Chest and abdomen pain with vomiting. No appetite. History of Crohn disease.  EXAM: ABDOMEN - 2 VIEW  COMPARISON:  Abdomen 09/17/2013, CT abdomen and pelvis 09/16/2013.  FINDINGS: Right lower quadrant ostomy with soft tissue bulging superior to the ostomy site consistent with herniation as seen on prior studies. Paucity of gas in the abdomen. No gaseous distention of any bowel loops is demonstrated. Dilated fluid-filled loops are not excluded. No free intra-abdominal air. No abnormal air-fluid levels. Surgical clips in the right upper quadrant and left upper quadrant with bowel staples in the pelvis. Metallic foreign body in the left upper quadrant similar prior study. Degenerative changes in the spine and hips.  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 Paucity of gas in the abdomen. No gaseous distention of bowel loops to suggest obstruction.   Electronically Signed   By: Burman Nieves M.D.   On: 06/11/2014 23:54   US Abdomen Limited Ruq  06/12/2014   CLINICAL DATA:  Acute onset of vomiting.  Initial encounter.  EXAM: US ABDOMEN LIMITED - RIGHT UPPER QUADRANT  COMPARISON:  CT of the abdomen and pelvis from 09/16/2013  FINDINGS: Gallbladder:  Status post cholecystectomy.  No retained stones seen.  Common bile duct:  Diameter: 0.4 cm, within normal limits in caliber.  Liver:  No focal  lesion identified. Within normal limits in parenchymal echogenicity.  IMPRESSION: Unremarkable ultrasound of the right upper quadrant. Status post cholecystectomy.   Electronically Signed   By: Roanna Raider M.D.   On: 06/12/2014 02:40    Microbiology: Recent Results (from the past 240 hour(s))  Urine culture     Status: None   Collection Time: 06/11/14  2:56 PM  Result Value Ref Range Status   Specimen Description URINE, CLEAN CATCH  Final   Special Requests NONE  Final   Colony Count NO GROWTH Performed at [facility]   Final   Culture NO GROWTH Performed at [facility]   Final   Report Status 06/12/2014 FINAL  Final  Clostridium Difficile by PCR     Status: None   Collection Time: 06/11/14  6:49 PM  Result Value Ref Range Status   C difficile by pcr NEGATIVE NEGATIVE Final     Labs: Basic Metabolic Panel:  Recent Labs Lab 06/11/14 1326 06/12/14 0107 06/13/14 0511  NA 132* 133* 134*  K 3.2* 3.2* 4.0  CL 91* 98 105  CO2 GLUCOSE 123* 115* 93  BUN CREATININE 1.39* 1.18 1.04  CALCIUM 8.9 8.0* 7.7*  MG  --  1.5  --    Liver Function Tests:  Recent Labs Lab 06/11/14 1327  AST 41*  ALT 34  ALKPHOS 86  BILITOT 0.8  PROT 6.2  ALBUMIN 2.9*    Recent Labs Lab 06/11/14 1327  LIPASE 39   No results for input(s): AMMONIA in the last 168 hours. CBC:  Recent Labs Lab 06/11/14 1326 06/12/14 0107 06/13/14 0511  WBC 9.1 6.9 4.8  HGB 13.4 10.9* 11.8*  HCT 36.3* 25.6* 35.0*  MCV 90.8 100.0 95.1  PLT 237 236 220   Cardiac Enzymes:  Recent Labs Lab 06/11/14 1327 06/11/14 1936 06/12/14 0107  TROPONINI <0.03 <0.03 <0.03   BNP: BNP (last 3 results) No results for input(s): PROBNP in the last 8760 hours. CBG: No results for input(s): GLUCAP in the  last 168 hours.     SignedEdsel Petrin  [facility] 06/13/2014, 10:06 AM

## 2014-06-13 NOTE — Discharge Instructions (Signed)
Esophagitis Esophagitis is inflammation of the esophagus. It can involve swelling, soreness, and pain in the esophagus. This condition can make it difficult and painful to swallow. CAUSES  Most causes of esophagitis are not serious. Many different factors can cause esophagitis, including:  Gastroesophageal reflux disease (GERD). This is when acid from your stomach flows up into the esophagus.  Recurrent vomiting.  An allergic-type reaction.  Certain medicines, especially those that come in large pills.  Ingestion of harmful chemicals, such as household cleaning products.  Heavy alcohol use.  An infection of the esophagus.  Radiation treatment for cancer.  Certain diseases such as sarcoidosis, Crohn's disease, and scleroderma. These diseases may cause recurrent esophagitis. SYMPTOMS   Trouble swallowing.  Painful swallowing.  Chest pain.  Difficulty breathing.  Nausea.  Vomiting.  Abdominal pain. DIAGNOSIS  Your caregiver will take your history and do a physical exam. Depending upon what your caregiver finds, certain tests may also be done, including:  Barium X-ray. You will drink a solution that coats the esophagus, and X-rays will be taken.  Endoscopy. A lighted tube is put down the esophagus so your caregiver can examine the area.  Allergy tests. These can sometimes be arranged through follow-up visits. TREATMENT  Treatment will depend on the cause of your esophagitis. In some cases, steroids or other medicines may be given to help relieve your symptoms or to treat the underlying cause of your condition. Medicines that may be recommended include:  Viscous lidocaine, to soothe the esophagus.  Antacids.  Acid reducers.  Proton pump inhibitors.  Antiviral medicines for certain viral infections of the esophagus.  Antifungal medicines for certain fungal infections of the esophagus.  Antibiotic medicines, depending on the cause of the esophagitis. HOME CARE  INSTRUCTIONS   Avoid foods and drinks that seem to make your symptoms worse.  Eat small, frequent meals instead of large meals.  Avoid eating for the 3 hours prior to your bedtime.  If you have trouble taking pills, use a pill splitter to decrease the size and likelihood of the pill getting stuck or injuring the esophagus on the way down. Drinking water after taking a pill also helps.  Stop smoking if you smoke.  Maintain a healthy weight.  Wear loose-fitting clothing. Do not wear anything tight around your waist that causes pressure on your stomach.  Raise the head of your bed 6 to 8 inches with wood blocks to help you sleep. Extra pillows will not help.  Only take over-the-counter or prescription medicines as directed by your caregiver. SEEK IMMEDIATE MEDICAL CARE IF:  You have severe chest pain that radiates into your arm, neck, or jaw.  You feel sweaty, dizzy, or lightheaded.  You have shortness of breath.  You vomit blood.  You have difficulty or pain with swallowing.  You have bloody or black, tarry stools.  You have a fever.  You have a burning sensation in the chest more than 3 times a week for more than 2 weeks.  You cannot swallow, drink, or eat.  You drool because you cannot swallow your saliva. MAKE SURE YOU:  Understand these instructions.  Will watch your condition.  Will get help right away if you are not doing well or get worse. Document Released: 06/11/2004 Document Revised: 07/27/2011 Document Reviewed: 01/02/2011 [employer] Patient Information 2015 [employer]. This information is not intended to replace advice given to you by your health care provider. Make sure you discuss any questions you have with your health care provider.  

## 2014-06-15 ENCOUNTER — Encounter (HOSPITAL_COMMUNITY)
Admission: RE | Disposition: A | Discharge status: Home / Self Care | Payer: Self-pay | Source: Ambulatory Visit | Attending: Internal Medicine

## 2014-06-15 ENCOUNTER — Ambulatory Visit (HOSPITAL_COMMUNITY)
Admission: RE | Admit: 2014-06-15 | Discharge: 2014-06-15 | Disposition: A | Discharge status: Home / Self Care | Payer: Managed Care, Other (non HMO) | Source: Ambulatory Visit | Attending: Internal Medicine | Admitting: Internal Medicine

## 2014-06-15 ENCOUNTER — Encounter (HOSPITAL_COMMUNITY): Payer: Self-pay | Admitting: *Deleted

## 2014-06-15 DIAGNOSIS — K2960 Other gastritis without bleeding: Secondary | ICD-10-CM | POA: Insufficient documentation

## 2014-06-15 DIAGNOSIS — F419 Anxiety disorder, unspecified: Secondary | ICD-10-CM | POA: Insufficient documentation

## 2014-06-15 DIAGNOSIS — K225 Diverticulum of esophagus, acquired: Secondary | ICD-10-CM

## 2014-06-15 DIAGNOSIS — K210 Gastro-esophageal reflux disease with esophagitis: Secondary | ICD-10-CM | POA: Insufficient documentation

## 2014-06-15 DIAGNOSIS — Z91013 Allergy to seafood: Secondary | ICD-10-CM | POA: Insufficient documentation

## 2014-06-15 DIAGNOSIS — K5710 Diverticulosis of small intestine without perforation or abscess without bleeding: Secondary | ICD-10-CM | POA: Diagnosis not present

## 2014-06-15 DIAGNOSIS — K449 Diaphragmatic hernia without obstruction or gangrene: Secondary | ICD-10-CM | POA: Insufficient documentation

## 2014-06-15 DIAGNOSIS — R110 Nausea: Secondary | ICD-10-CM

## 2014-06-15 DIAGNOSIS — K319 Disease of stomach and duodenum, unspecified: Secondary | ICD-10-CM | POA: Diagnosis not present

## 2014-06-15 DIAGNOSIS — Z87891 Personal history of nicotine dependence: Secondary | ICD-10-CM | POA: Insufficient documentation

## 2014-06-15 DIAGNOSIS — K2970 Gastritis, unspecified, without bleeding: Secondary | ICD-10-CM

## 2014-06-15 DIAGNOSIS — R1013 Epigastric pain: Secondary | ICD-10-CM

## 2014-06-15 HISTORY — PX: ESOPHAGOGASTRODUODENOSCOPY: SHX5428

## 2014-06-15 SURGERY — EGD (ESOPHAGOGASTRODUODENOSCOPY)
Anesthesia: Moderate Sedation

## 2014-06-15 MED ORDER — MIDAZOLAM HCL 5 MG/5ML IJ SOLN
INTRAMUSCULAR | Status: AC
  Filled 2014-06-15: qty 10

## 2014-06-15 MED ORDER — MEPERIDINE HCL 50 MG/ML IJ SOLN: INTRAMUSCULAR | Status: DC | PRN

## 2014-06-15 MED ORDER — FENTANYL CITRATE 0.05 MG/ML IJ SOLN
INTRAMUSCULAR | Status: AC
  Filled 2014-06-15: qty 2

## 2014-06-15 MED ORDER — STERILE WATER FOR IRRIGATION IR SOLN
Status: DC | PRN
  Administered 2014-06-15: 14:00:00

## 2014-06-15 MED ORDER — MIDAZOLAM HCL 5 MG/5ML IJ SOLN: INTRAMUSCULAR | Status: DC | PRN

## 2014-06-15 MED ORDER — FENTANYL CITRATE 0.05 MG/ML IJ SOLN
INTRAMUSCULAR | Status: DC | PRN
  Administered 2014-06-15 (×2): 25 ug via INTRAVENOUS

## 2014-06-15 MED ORDER — BUTAMBEN-TETRACAINE-BENZOCAINE 2-2-14 % EX AERO
INHALATION_SPRAY | CUTANEOUS | Status: DC | PRN
Start: 2014-06-15 — End: 2014-06-15
  Administered 2014-06-15: 2 via TOPICAL

## 2014-06-15 MED ORDER — MIDAZOLAM HCL 5 MG/5ML IJ SOLN
INTRAMUSCULAR | Status: DC | PRN
  Administered 2014-06-15 (×3): 2 mg via INTRAVENOUS

## 2014-06-15 MED ORDER — SODIUM CHLORIDE 0.9 % IV SOLN
INTRAVENOUS | Status: DC
  Administered 2014-06-15: 13:00:00 via INTRAVENOUS

## 2014-06-15 MED ORDER — MEPERIDINE HCL 50 MG/ML IJ SOLN
INTRAMUSCULAR | Status: AC
  Filled 2014-06-15: qty 1

## 2014-06-15 NOTE — H&P (Signed)
Lee Klein is an 64 y.o. male.   Chief Complaint: Patient is here for EGD. 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 He was seen in the office on 06/07/2014 and was complaining of epigastric pain and nausea. PPI dose was doubled. He was told if he did not get better he will need diagnostic EGD. In the meantime he developed prolonged chest pain on 06/11/2014 after he left his orthopedic surgeon's office. Patient was admitted to [facility] and cardiac evaluation was negative. EGD was recommended and he requested that it be performed at [facility]. He feels much better since he has stopped antibiotics which were rifampin and Zithromax.  Past Medical History  Diagnosis Date  . Crohn's disease   . Hernia, abdominal   . GERD (gastroesophageal reflux disease)   . Anxiety   . Atypical mycobacterial infection of hand 2016    L wrist    Past Surgical History  Procedure Laterality Date  . Ileostomy    . Colonoscopy    . Upper gastrointestinal endoscopy    . Appendectomy    . Colon surgery    . Wrist surgery      right  . Ileostomy closure N/A 10/23/2013    Procedure: ILEOSTOMY TAKEDOWN, REPAIR OF PARASTOMAL HERNIA.;  Surgeon: Cherylynn Ridges, MD;  Location: [facility] OR;  Service: General;  Laterality: N/A;  . Hernia repair    . Wrist arthroscopy with debridement Left 05/24/2014    Procedure: LEFT ARTHROSCOPY WRIST CULTURE/BIOPSY DEBRIDEMENT;  Surgeon: Cindee Salt, MD;  Location: [facility];  Service: Orthopedics;  Laterality: Left;  . R wrist fracture Right 2007    screws    Family History  Problem Relation Age of Onset  . Healthy Mother   . Healthy Daughter   . Healthy Son    Social History:  reports that he quit smoking about 8 years ago. His smoking use included Cigars. He has quit using smokeless  tobacco. He reports that he does not drink alcohol or use illicit drugs.  Allergies:  Allergies  Allergen Reactions  . Ivp Dye [Iodinated Diagnostic Agents]     Joint pain, nausea  . Fish Allergy Nausea And Vomiting  . Shellfish Allergy Swelling  . Demerol Rash    Medications Prior to Admission  Medication Sig Dispense Refill  . azithromycin (ZITHROMAX) 250 MG tablet Take 1 tablet (250 mg total) by mouth daily. 30 each 1  . fluticasone (FLONASE) 50 MCG/ACT nasal spray Place 2 sprays into both nostrils daily as needed for allergies or rhinitis.   2  . HYDROcodone-acetaminophen (NORCO/VICODIN) 5-325 MG per tablet Take 1-2 tablets by mouth 2 (two) times daily as needed for moderate pain. (Patient taking differently: Take 0.5 tablets by mouth at bedtime as needed for moderate pain. ) 60 tablet 0  . loperamide (IMODIUM A-D) 2 MG tablet Take 8 mg by mouth 2 (two) times daily.     . methylPREDNISolone (MEDROL) 4 MG tablet Take 4 mg by mouth. Decrease Medrol to 2 tabs in the AM, 3 tabs in the PM for 3 days, then 2 tabs twice a day for 3 days, then 1 tab in the AM and 2 tabs in PM for 3 days, Twice daily for 3 days, then 1 tab daily for 3 days, then stop.    . Multiple  Vitamin (THERA) TABS Take 1 tablet by mouth daily.    . ondansetron (ZOFRAN) 4 MG tablet Take 1 tablet (4 mg total) by mouth 2 (two) times daily as needed for nausea or vomiting. 30 tablet 0  . pantoprazole (PROTONIX) 40 MG tablet Take 1 tablet (40 mg total) by mouth 2 (two) times daily before a meal. 60 tablet 2  . Zinc 50 MG CAPS Take 1 capsule by mouth daily.     . Acetaminophen (TYLENOL PO) Take 2 tablets by mouth daily as needed (pain).    Marland Kitchen ALPRAZolam (XANAX) 0.5 MG tablet Take 1 tablet (0.5 mg total) by mouth 2 (two) times daily as needed for anxiety. (Patient taking differently: Take 0.5 mg by mouth daily as needed for anxiety. ) 60 tablet 2  . Ascorbic Acid (VITAMIN C) 1000 MG tablet Take 1,000 mg by mouth daily.    Marland Kitchen aspirin  EC 81 MG tablet Take 1 tablet (81 mg total) by mouth daily.    . Calcium Carbonate-Vitamin D (CALCIUM 600 + D PO) Take 1 tablet by mouth daily.     . feeding supplement, ENSURE COMPLETE, (ENSURE COMPLETE) LIQD Take 237 mLs by mouth 2 (two) times daily between meals.    . fluconazole (DIFLUCAN) 200 MG tablet Take 1 tablet (200 mg total) by mouth daily. 4 tablet 0  . Multiple Vitamins-Minerals (MULTIVITAMIN PO) Take 1 tablet by mouth daily.     Marland Kitchen nystatin (MYCOSTATIN/NYSTOP) 100000 UNIT/GM POWD Apply topically See admin instructions. PRN  1    No results found for this or any previous visit (from the past 48 hour(s)). No results found.  ROS  Blood pressure 130/75, pulse 84, temperature 98 F (36.7 C), temperature source Oral, resp. rate 18, SpO2 95 %. Physical Exam  Constitutional: He appears well-developed and well-nourished.  HENT:  Mouth/Throat: Oropharynx is clear and moist.  Eyes: Conjunctivae are normal. No scleral icterus.  Neck: No thyromegaly present.  Cardiovascular: Normal rate, regular rhythm and normal heart sounds.   No murmur heard. Respiratory: Effort normal and breath sounds normal.  GI:  Patient has ileostomy in right midabdomen containing yellowish brown stool. Abdomen is soft and nontender. No organomegaly or masses.  Musculoskeletal: He exhibits no edema.  He has left wrist splint in place  Lymphadenopathy:    He has no cervical adenopathy.  Neurological: He is alert.  Skin: Skin is warm and dry.     Assessment/Plan Noncardiac chest pain. ? Pill esophagitis. History of epigastric pain and nausea. Diagnostic EGD.  Lee Klein U 06/15/2014, 1:46 PM

## 2014-06-15 NOTE — Discharge Instructions (Signed)
Resume usual medications and diet. No driving for 24 hours. Physician will call with biopsy results.   Hiatal Hernia A hiatal hernia occurs when part of your stomach slides above the muscle that separates your abdomen from your chest (diaphragm). You can be born with a hiatal hernia (congenital), or it may develop over time. In almost all cases of hiatal hernia, only the top part of the stomach pushes through.  Many people have a hiatal hernia with no symptoms. The larger the hernia, the more likely that you will have symptoms. In some cases, a hiatal hernia allows stomach acid to flow back into the tube that carries food from your mouth to your stomach (esophagus). This may cause heartburn symptoms. Severe heartburn symptoms may mean you have developed a condition called gastroesophageal reflux disease (GERD).  CAUSES  Hiatal hernias are caused by a weakness in the opening (hiatus) where your esophagus passes through your diaphragm to attach to the upper part of your stomach. You may be born with a weakness in your hiatus, or a weakness can develop. RISK FACTORS Older age is a major risk factor for a hiatal hernia. Anything that increases pressure on your diaphragm can also increase your risk of a hiatal hernia. This includes:  Pregnancy.  Excess weight.  Frequent constipation. SIGNS AND SYMPTOMS  People with a hiatal hernia often have no symptoms. If symptoms develop, they are almost always caused by GERD. They may include:  Heartburn.  Belching.  Indigestion.  Trouble swallowing.  Coughing or wheezing.  Sore throat.  Hoarseness.  Chest pain. DIAGNOSIS  A hiatal hernia is sometimes found during an exam for another problem. Your health care provider may suspect a hiatal hernia if you have symptoms of GERD. Tests may be done to diagnose GERD. These may include:  X-rays of your stomach or chest.  An upper gastrointestinal (GI) series. This is an X-ray exam of your GI tract  involving the use of a chalky liquid that you swallow. The liquid shows up clearly on the X-ray.  Endoscopy. This is a procedure to look into your stomach using a thin, flexible tube that has a tiny camera and light on the end of it. TREATMENT  If you have no symptoms, you may not need treatment. If you have symptoms, treatment may include:  Dietary and lifestyle changes to help reduce GERD symptoms.  Medicines. These may include:  Over-the-counter antacids.  Medicines that make your stomach empty more quickly.  Medicines that block the production of stomach acid (H2 blockers).  Stronger medicines to reduce stomach acid (proton pump inhibitors).  You may need surgery to repair the hernia if other treatments are not helping. HOME CARE INSTRUCTIONS   Take all medicines as directed by your health care provider.  Quit smoking, if you smoke.  Try to achieve and maintain a healthy body weight.  Eat frequent small meals instead of three large meals a day. This keeps your stomach from getting too full.  Eat slowly.  Do not lie down right after eating.  Do noteat 1-2 hours before bed.   Do not drink beverages with caffeine. These include cola, coffee, cocoa, and tea.  Do not drink alcohol.  Avoid foods that can make symptoms of GERD worse. These may include:  Fatty foods.  Citrus fruits.  Other foods and drinks that contain acid.  Avoid putting pressure on your belly. Anything that puts pressure on your belly increases the amount of acid that may be pushed up  into your esophagus.   Avoid bending over, especially after eating.  Raise the head of your bed by putting blocks under the legs. This keeps your head and esophagus higher than your stomach.  Do not wear tight clothing around your chest or stomach.  Try not to strain when having a bowel movement, when urinating, or when lifting heavy objects. SEEK MEDICAL CARE IF:  Your symptoms are not controlled with  medicines or lifestyle changes.  You are having trouble swallowing.  You have coughing or wheezing that will not go away. SEEK IMMEDIATE MEDICAL CARE IF:  Your pain is getting worse.  Your pain spreads to your arms, neck, jaw, teeth, or back.  You have shortness of breath.  You sweat for no reason.  You feel sick to your stomach (nauseous) or vomit.  You vomit blood.  You have bright red blood in your stools.  You have black, tarry stools.  Document Released: 07/25/2003 Document Revised: 09/18/2013 Document Reviewed: 04/21/2013 [facility] Patient Information 2015 [place], [place]. This information is not intended to replace advice given to you by your health care provider. Make sure you discuss any questions you have with your health care provider.     Esophagogastroduodenoscopy Care After Refer to this sheet in the next few weeks. These instructions provide you with information on caring for yourself after your procedure. Your caregiver may also give you more specific instructions. Your treatment has been planned according to current medical practices, but problems sometimes occur. Call your caregiver if you have any problems or questions after your procedure.  HOME CARE INSTRUCTIONS  Do not eat or drink anything until the numbing medicine (local anesthetic) has worn off and your gag reflex has returned. You will know that the local anesthetic has worn off when you can swallow comfortably.  Do not drive for 12 hours after the procedure or as directed by your caregiver.  Only take medicines as directed by your caregiver. SEEK MEDICAL CARE IF:   You cannot stop coughing.  You are not urinating at all or less than usual. SEEK IMMEDIATE MEDICAL CARE IF:  You have difficulty swallowing.  You cannot eat or drink.  You have worsening throat or chest pain.  You have dizziness, lightheadedness, or you faint.  You have nausea or vomiting.  You have chills.  You have a  fever.  You have severe abdominal pain.  You have black, tarry, or bloody stools. Document Released: 04/20/2012 Document Reviewed: 04/20/2012 [facility] Patient Information 2015 [place], [place]. This information is not intended to replace advice given to you by your health care provider. Make sure you discuss any questions you have with your health care provider.

## 2014-06-15 NOTE — Op Note (Signed)
EGD PROCEDURE REPORT  PATIENT:  Lee Klein  MR#:  [id number] Birthdate:  Jul 08, 1950, 64 y.o., male Endoscopist:  Dr. Malissa Hippo, MD  Procedure Date: 06/15/2014  Procedure:   EGD  Indications:  Patient is 64 year old Caucasian male who has history of epigastric pain and nausea. He was seen in the office recently and EPI dose was doubled.. This week he was admitted to [facility] with chest pain which was felt to be noncardiac. 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            Informed Consent:  The risks, benefits, alternatives & imponderables which include, but are not limited to, bleeding, infection, perforation, drug reaction and potential missed lesion have been reviewed.  The potential for biopsy, lesion removal, esophageal dilation, etc. have also been discussed.  Questions have been answered.  All parties agreeable.  Please see history & physical in medical record for more information.  Medications:  Fentanyl 50 g IV Versed 6 mg IV Cetacaine spray topically for oropharyngeal anesthesia  Description of procedure:  The endoscope was introduced through the mouth and advanced to the second portion of the duodenum without difficulty or limitations. The mucosal surfaces were surveyed very carefully during advancement of the scope and upon withdrawal.  Findings:  Esophagus:  Mucosa of the esophagus normal. Focal edema and erythema noted at GE junction. No ring or stricture present. GEJ:  38 cm Hiatus:  40 cm Stomach:  Stomach was empty and distended very well with insufflation. Folds in the proximal stomach were normal. Examination of mucosa at gastric body was normal. Antral mucosa revealed patchy edema and erythema. Pyloric channel was patent. Tenderness fundus and cardia were unremarkable. Duodenum:  Normal bulbar and post bulbar mucosa. Three diverticula noted involving second part of the  duodenum.  Therapeutic/Diagnostic Maneuvers Performed:   Antral biopsy taken for routine histology.  Complications:  None  Impression: Mild changes of reflux esophagitis limited to GE junction. No evidence of pill esophagitis. Small sliding hiatal hernia. Nonerosive antral gastritis. Incidental finding of 3 duodenal diverticula.  Recommendations:  Continue pantoprazole at 40 mg by mouth twice a day. I will be contacting patient with biopsy results and further recommendations. Will plan to see him in the office in 6 weeks.  Elyce Zollinger U  06/15/2014  2:15 PM  CC: Dr. [name], MD & Dr. Bonnetta Barry ref. provider found

## 2014-06-18 ENCOUNTER — Encounter (HOSPITAL_COMMUNITY): Payer: Self-pay | Admitting: Internal Medicine

## 2014-06-19 ENCOUNTER — Inpatient Hospital Stay (HOSPITAL_COMMUNITY): Payer: Managed Care, Other (non HMO)

## 2014-06-19 ENCOUNTER — Encounter: Payer: Self-pay | Admitting: Infectious Diseases

## 2014-06-19 ENCOUNTER — Telehealth: Payer: Self-pay | Admitting: *Deleted

## 2014-06-19 ENCOUNTER — Ambulatory Visit (INDEPENDENT_AMBULATORY_CARE_PROVIDER_SITE_OTHER): Payer: Managed Care, Other (non HMO) | Admitting: Infectious Diseases

## 2014-06-19 ENCOUNTER — Inpatient Hospital Stay (HOSPITAL_COMMUNITY)
Admission: AD | Admit: 2014-06-19 | Discharge: 2014-06-22 | DRG: 549 | Disposition: A | Discharge status: Home Health | Payer: Managed Care, Other (non HMO) | Source: Ambulatory Visit | Attending: Internal Medicine | Admitting: Internal Medicine

## 2014-06-19 ENCOUNTER — Encounter (HOSPITAL_COMMUNITY): Payer: Self-pay | Admitting: General Practice

## 2014-06-19 VITALS — BP 127/67 | HR 101 | Temp 98.8°F | Ht 70.0 in | Wt 167.5 lb

## 2014-06-19 DIAGNOSIS — F419 Anxiety disorder, unspecified: Secondary | ICD-10-CM | POA: Diagnosis present

## 2014-06-19 DIAGNOSIS — A319 Mycobacterial infection, unspecified: Secondary | ICD-10-CM

## 2014-06-19 DIAGNOSIS — K210 Gastro-esophageal reflux disease with esophagitis: Secondary | ICD-10-CM | POA: Diagnosis present

## 2014-06-19 DIAGNOSIS — Z87891 Personal history of nicotine dependence: Secondary | ICD-10-CM

## 2014-06-19 DIAGNOSIS — M00832 Arthritis due to other bacteria, left wrist: Secondary | ICD-10-CM | POA: Diagnosis present

## 2014-06-19 DIAGNOSIS — Z79899 Other long term (current) drug therapy: Secondary | ICD-10-CM

## 2014-06-19 DIAGNOSIS — Z885 Allergy status to narcotic agent status: Secondary | ICD-10-CM

## 2014-06-19 DIAGNOSIS — M868X4 Other osteomyelitis, hand: Secondary | ICD-10-CM | POA: Diagnosis present

## 2014-06-19 DIAGNOSIS — K50919 Crohn's disease, unspecified, with unspecified complications: Secondary | ICD-10-CM

## 2014-06-19 DIAGNOSIS — Z91041 Radiographic dye allergy status: Secondary | ICD-10-CM | POA: Diagnosis not present

## 2014-06-19 DIAGNOSIS — Z91013 Allergy to seafood: Secondary | ICD-10-CM | POA: Diagnosis not present

## 2014-06-19 DIAGNOSIS — K219 Gastro-esophageal reflux disease without esophagitis: Secondary | ICD-10-CM | POA: Diagnosis not present

## 2014-06-19 DIAGNOSIS — K5090 Crohn's disease, unspecified, without complications: Secondary | ICD-10-CM | POA: Diagnosis present

## 2014-06-19 DIAGNOSIS — M009 Pyogenic arthritis, unspecified: Secondary | ICD-10-CM | POA: Diagnosis present

## 2014-06-19 DIAGNOSIS — Z87442 Personal history of urinary calculi: Secondary | ICD-10-CM | POA: Diagnosis not present

## 2014-06-19 DIAGNOSIS — R609 Edema, unspecified: Secondary | ICD-10-CM

## 2014-06-19 DIAGNOSIS — M869 Osteomyelitis, unspecified: Secondary | ICD-10-CM | POA: Insufficient documentation

## 2014-06-19 DIAGNOSIS — Z7982 Long term (current) use of aspirin: Secondary | ICD-10-CM | POA: Diagnosis not present

## 2014-06-19 DIAGNOSIS — Z113 Encounter for screening for infections with a predominantly sexual mode of transmission: Secondary | ICD-10-CM | POA: Insufficient documentation

## 2014-06-19 DIAGNOSIS — M7989 Other specified soft tissue disorders: Secondary | ICD-10-CM

## 2014-06-19 LAB — CBC WITH DIFFERENTIAL/PLATELET
BASOS ABS: 0 10*3/uL (ref 0.0–0.1)
Basophils Relative: 0 % (ref 0–1)
EOS ABS: 0 10*3/uL (ref 0.0–0.7)
EOS PCT: 0 % (ref 0–5)
HCT: 32 % — ABNORMAL LOW (ref 39.0–52.0)
Hemoglobin: 10.8 g/dL — ABNORMAL LOW (ref 13.0–17.0)
LYMPHS ABS: 2 10*3/uL (ref 0.7–4.0)
LYMPHS PCT: 19 % (ref 12–46)
MCH: 33.4 pg (ref 26.0–34.0)
MCHC: 33.8 g/dL (ref 30.0–36.0)
MCV: 99.1 fL (ref 78.0–100.0)
MONO ABS: 1.4 10*3/uL — AB (ref 0.1–1.0)
Monocytes Relative: 13 % — ABNORMAL HIGH (ref 3–12)
NEUTROS ABS: 7 10*3/uL (ref 1.7–7.7)
NEUTROS PCT: 68 % (ref 43–77)
Platelets: 295 10*3/uL (ref 150–400)
RBC: 3.23 MIL/uL — ABNORMAL LOW (ref 4.22–5.81)
RDW: 16.7 % — ABNORMAL HIGH (ref 11.5–15.5)
WBC: 10.5 10*3/uL (ref 4.0–10.5)

## 2014-06-19 LAB — COMPREHENSIVE METABOLIC PANEL
ALK PHOS: 81 U/L (ref 39–117)
ALT: 26 U/L (ref 0–53)
ANION GAP: 5 (ref 5–15)
AST: 23 U/L (ref 0–37)
Albumin: 2.7 g/dL — ABNORMAL LOW (ref 3.5–5.2)
BUN: 11 mg/dL (ref 6–23)
CHLORIDE: 105 mmol/L (ref 96–112)
CO2: 27 mmol/L (ref 19–32)
Calcium: 8.6 mg/dL (ref 8.4–10.5)
Creatinine, Ser: 1.01 mg/dL (ref 0.50–1.35)
GFR calc Af Amer: >90 mL/min (ref >90)
GFR, EST NON AFRICAN AMERICAN: 78 mL/min — AB (ref >90)
GLUCOSE: 85 mg/dL (ref 70–99)
Potassium: 3.9 mmol/L (ref 3.5–5.1)
Sodium: 137 mmol/L (ref 135–145)
TOTAL PROTEIN: 6.1 g/dL (ref 6.0–8.3)
Total Bilirubin: 0.2 mg/dL — ABNORMAL LOW (ref 0.3–1.2)

## 2014-06-19 MED ORDER — ZINC 50 MG PO CAPS: 1.0000 | ORAL_CAPSULE | Freq: Every day | ORAL | Status: DC

## 2014-06-19 MED ORDER — AZITHROMYCIN 250 MG PO TABS
250.0000 mg | ORAL_TABLET | Freq: Every day | ORAL | Status: DC
  Administered 2014-06-20: 250 mg via ORAL
  Filled 2014-06-19: qty 1

## 2014-06-19 MED ORDER — LOPERAMIDE HCL 2 MG PO TABS: 8.0000 mg | ORAL_TABLET | Freq: Two times a day (BID) | ORAL | Status: DC | PRN

## 2014-06-19 MED ORDER — ENSURE COMPLETE PO LIQD
237.0000 mL | Freq: Two times a day (BID) | ORAL | Status: DC
  Administered 2014-06-20 – 2014-06-21 (×4): 237 mL via ORAL

## 2014-06-19 MED ORDER — SODIUM CHLORIDE 0.9 % IV SOLN
INTRAVENOUS | Status: DC
  Administered 2014-06-19: 20:00:00 via INTRAVENOUS

## 2014-06-19 MED ORDER — METHYLPREDNISOLONE 4 MG PO TABS
4.0000 mg | ORAL_TABLET | Freq: Every day | ORAL | Status: DC
  Administered 2014-06-20 – 2014-06-21 (×2): 4 mg via ORAL
  Filled 2014-06-19 (×3): qty 1

## 2014-06-19 MED ORDER — ENOXAPARIN SODIUM 40 MG/0.4ML ~~LOC~~ SOLN
40.0000 mg | SUBCUTANEOUS | Status: DC
  Administered 2014-06-19 – 2014-06-20 (×2): 40 mg via SUBCUTANEOUS
  Filled 2014-06-19 (×4): qty 0.4

## 2014-06-19 MED ORDER — VANCOMYCIN HCL 10 G IV SOLR
1500.0000 mg | Freq: Once | INTRAVENOUS | Status: AC
  Administered 2014-06-19: 1500 mg via INTRAVENOUS
  Filled 2014-06-19: qty 1500

## 2014-06-19 MED ORDER — VANCOMYCIN HCL IN DEXTROSE 750-5 MG/150ML-% IV SOLN
750.0000 mg | Freq: Two times a day (BID) | INTRAVENOUS | Status: DC
  Administered 2014-06-20: 750 mg via INTRAVENOUS
  Filled 2014-06-19 (×2): qty 150

## 2014-06-19 MED ORDER — ASPIRIN EC 81 MG PO TBEC
81.0000 mg | DELAYED_RELEASE_TABLET | Freq: Every day | ORAL | Status: DC
  Administered 2014-06-19 – 2014-06-21 (×3): 81 mg via ORAL
  Filled 2014-06-19 (×4): qty 1

## 2014-06-19 MED ORDER — GADOBENATE DIMEGLUMINE 529 MG/ML IV SOLN
15.0000 mL | Freq: Once | INTRAVENOUS | Status: AC | PRN
Start: 2014-06-19 — End: 2014-06-19
  Administered 2014-06-19: 15 mL via INTRAVENOUS

## 2014-06-19 MED ORDER — DEXTROSE 5 % IV SOLN
1.0000 g | Freq: Three times a day (TID) | INTRAVENOUS | Status: DC
  Administered 2014-06-19 – 2014-06-20 (×2): 1 g via INTRAVENOUS
  Filled 2014-06-19 (×4): qty 1

## 2014-06-19 MED ORDER — PANTOPRAZOLE SODIUM 40 MG PO TBEC
40.0000 mg | DELAYED_RELEASE_TABLET | Freq: Two times a day (BID) | ORAL | Status: DC
  Administered 2014-06-19 – 2014-06-22 (×6): 40 mg via ORAL
  Filled 2014-06-19 (×6): qty 1

## 2014-06-19 MED ORDER — ZINC SULFATE 220 (50 ZN) MG PO CAPS
220.0000 mg | ORAL_CAPSULE | Freq: Every day | ORAL | Status: DC
  Administered 2014-06-20 – 2014-06-21 (×2): 220 mg via ORAL
  Filled 2014-06-19 (×4): qty 1

## 2014-06-19 MED ORDER — ALPRAZOLAM 0.5 MG PO TABS
0.5000 mg | ORAL_TABLET | Freq: Two times a day (BID) | ORAL | Status: DC | PRN
  Administered 2014-06-20 – 2014-06-21 (×2): 0.5 mg via ORAL
  Filled 2014-06-19 (×2): qty 1

## 2014-06-19 MED ORDER — HYDROCODONE-ACETAMINOPHEN 5-325 MG PO TABS
1.0000 | ORAL_TABLET | Freq: Two times a day (BID) | ORAL | Status: DC | PRN
  Administered 2014-06-19 – 2014-06-20 (×2): 2 via ORAL
  Filled 2014-06-19 (×2): qty 2

## 2014-06-19 MED ORDER — VITAMIN C 500 MG PO TABS
1000.0000 mg | ORAL_TABLET | Freq: Every day | ORAL | Status: DC
  Administered 2014-06-20 – 2014-06-21 (×2): 1000 mg via ORAL
  Filled 2014-06-19 (×3): qty 2

## 2014-06-19 MED ORDER — LOPERAMIDE HCL 2 MG PO CAPS
4.0000 mg | ORAL_CAPSULE | Freq: Two times a day (BID) | ORAL | Status: DC | PRN
  Administered 2014-06-19: 4 mg via ORAL
  Filled 2014-06-19: qty 2

## 2014-06-19 NOTE — Telephone Encounter (Signed)
Patient's wife called asking for advice, hoping to be seen today.  Patient had to stop rifampin due to side effects (Dr. Ninetta Lights aware).  He has continued on the azithromycin.  Patient now reports redness/swelling has returned to his hand, would like evaluation.  Dr. Ninetta Lights is unavailable today, Dr. Orvan Falconer will see patient.  Patient's wife accepted the appointment today at 3:30.   Andree Coss, RN

## 2014-06-19 NOTE — Progress Notes (Signed)
   Subjective:    Patient ID: Lee Klein, male    DOB: [birth year], 64 y.o.   MRN: [id number]  HPI 64 yo M with hx of Crohn's for 40 yrs (on azathioprine for >15 yrs), ileostomy/colostomy (2010) and L wrist swelling since May 2015.He states he had IV placed or venopuncture from his L wrist in early May prior to this.   He received previous depo-medrol injections, then started on oral medrol.  He was admitted on 05-24-14 and underwent arthroscopy where he was found to have erosive synovitis with scapholunate ligament tear. He underwent debridement, biopsies. He was found on AFB stain to be 1+.  Hhis pathology showed degenerative features, chronic inflammation, dystrophic calcifications.  No fever or chills.  Had previously negative TB test at his rheumatologist. No hx of TB exposure, no hx of overseas travel, no hx of homelessness, no hx of incarceration. He was seen in ID on 1-13 and started on azithro/rifampin. . By 1-23 he called ID complaining of n/v, dizziness. I asked him to stop the rifampin which he did not do.  He went to hospital on 1-25 with dizziness, n/v, and chest pain. His CV eval was (-). He was d/c home on 1-27, off rifampin. He had temp 101 last night in hospital per pt.  He underwent EGD which showed mild esophagitis (h pylori-).   The specimen was sent to [facility] on 06-09-14.  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  His wrist feels hot now.   Review of Systems     Objective:   Physical Exam  Constitutional: He appears well-developed and well-nourished.  Musculoskeletal:       Arms:         Assessment & Plan:

## 2014-06-19 NOTE — Assessment & Plan Note (Addendum)
His wrist is much worse. Will have him admitted to the hospital. Will ask that hospital get a MRI, start him on vanco and cefoxitin (which should have MAI efficacy). Hopefully can avoid aminoglycoside.  Would ask hospital MD to contact his hand surgeon (Dr [name]).  sensi asked for today from lab, will take several weeks.

## 2014-06-19 NOTE — Progress Notes (Signed)
ANTIBIOTIC CONSULT NOTE - INITIAL  Pharmacy Consult for vancomycin and ceftazidime Indication: wrist infection  Allergies  Allergen Reactions  . Ivp Dye [Iodinated Diagnostic Agents]     Joint pain, nausea  . Fish Allergy Nausea And Vomiting  . Shellfish Allergy Swelling  . Demerol Rash    Patient Measurements: Height: 5\' 10"  (177.8 cm) Weight: 161 lb 6 oz (73.2 kg) IBW/kg (Calculated) : 73  Vital Signs: Temp: 97.9 F (36.6 C) (02/02 1708) Temp Source: Oral (02/02 1708) BP: 118/99 mmHg (02/02 1708) Pulse Rate: 91 (02/02 1708) Intake/Output from previous day:   Intake/Output from this shift:    Labs: No results for input(s): WBC, HGB, PLT, LABCREA, CREATININE in the last 72 hours. Estimated Creatinine Clearance: 77 mL/min (by C-G formula based on Cr of 1.04). No results for input(s): VANCOTROUGH, VANCOPEAK, VANCORANDOM, GENTTROUGH, GENTPEAK, GENTRANDOM, TOBRATROUGH, TOBRAPEAK, TOBRARND, AMIKACINPEAK, AMIKACINTROU, AMIKACIN in the last 72 hours.   Microbiology: Recent Results (from the past 720 hour(s))  Anaerobic culture     Status: None   Collection Time: 05/24/14  2:37 PM  Result Value Ref Range Status   Specimen Description FLUID WRIST SYNOVIAL LEFT  Final   Special Requests NONE  Final   Gram Stain   Final    FEW WBC PRESENT,BOTH PMN AND MONONUCLEAR NO ORGANISMS SEEN Performed at [facility]    Culture   Final    NO ANAEROBES ISOLATED Performed at [facility]    Report Status 05/29/2014 FINAL  Final  Fungus Culture with Smear     Status: None (Preliminary result)   Collection Time: 05/24/14  2:37 PM  Result Value Ref Range Status   Specimen Description FLUID WRIST SYNOVIAL LEFT  Final   Special Requests NONE  Final   Fungal Smear   Final    NO YEAST OR FUNGAL ELEMENTS SEEN Performed at [facility]    Culture   Final    CULTURE IN PROGRESS FOR FOUR WEEKS Performed at [facility]    Report Status PENDING  Incomplete   Body fluid culture     Status: None (Preliminary result)   Collection Time: 05/24/14  2:37 PM  Result Value Ref Range Status   Specimen Description FLUID WRIST SYNOVIAL LEFT  Final   Special Requests NONE  Final   Gram Stain   Final    FEW WBC PRESENT,BOTH PMN AND MONONUCLEAR NO ORGANISMS SEEN Performed at [facility]    Culture   Final    FEW ACID FAST BACILLI ISOLATED 9615 Note: REFER TO PREVIOUS CULTURE [id number] FOR CALLS IDENTIFICATION TO FOLLOW FAXED TO 375 Performed at [facility]    Report Status PENDING  Incomplete  AFB culture with smear     Status: None (Preliminary result)   Collection Time: 05/24/14  2:37 PM  Result Value Ref Range Status   Specimen Description FLUID WRIST SYNOVIAL LEFT  Final   Special Requests NONE  Final   Acid Fast Smear   Final    1681 1+ ACID FAST BACILLI SEEN CRITICAL RESULT CALLED TO, READ BACK BY AND VERIFIED WITH: ALICIA 15:55 05/25/14 FUENG FAXED TO 544 Performed at [facility]    Culture   Final    MYCOBACTERIUM ABSCESSUS Note: CRITICAL RESULT CALLED TO, READ BACK BY AND VERIFIED WITH: DR.HATCHER @ 1631 ON 06/19/14 BY WILSN SUSCEPTIBILITY TO FOLLOW Performed at [facility]    Report Status PENDING  Incomplete  Urine culture  Status: None   Collection Time: 06/11/14  2:56 PM  Result Value Ref Range Status   Specimen Description URINE, CLEAN CATCH  Final   Special Requests NONE  Final   Colony Count NO GROWTH Performed at [facility]   Final   Culture NO GROWTH Performed at [facility]   Final   Report Status 06/12/2014 FINAL  Final  Clostridium Difficile by PCR     Status: None   Collection Time: 06/11/14  6:49 PM  Result Value Ref Range Status   C difficile by pcr NEGATIVE NEGATIVE Final    Medical History: Past Medical History  Diagnosis Date  . Crohn's disease   . Hernia, abdominal   . GERD (gastroesophageal reflux disease)   . Anxiety   . Atypical mycobacterial  infection of hand 2016    L wrist    Medications:  Prescriptions prior to admission  Medication Sig Dispense Refill Last Dose  . Acetaminophen (TYLENOL PO) Take 2 tablets by mouth daily as needed (pain).   06/09/2014  . ALPRAZolam (XANAX) 0.5 MG tablet Take 1 tablet (0.5 mg total) by mouth 2 (two) times daily as needed for anxiety. (Patient taking differently: Take 0.5 mg by mouth daily as needed for anxiety. ) 60 tablet 2 06/09/2014  . Ascorbic Acid (VITAMIN C) 1000 MG tablet Take 1,000 mg by mouth daily.   06/09/2014  . aspirin EC 81 MG tablet Take 1 tablet (81 mg total) by mouth daily.     Marland Kitchen azithromycin (ZITHROMAX) 250 MG tablet Take 1 tablet (250 mg total) by mouth daily. 30 each 1 06/14/2014 at Unknown time  . Calcium Carbonate-Vitamin D (CALCIUM 600 + D PO) Take 1 tablet by mouth daily.    06/09/2014  . feeding supplement, ENSURE COMPLETE, (ENSURE COMPLETE) LIQD Take 237 mLs by mouth 2 (two) times daily between meals.     . fluconazole (DIFLUCAN) 200 MG tablet Take 1 tablet (200 mg total) by mouth daily. 4 tablet 0   . fluticasone (FLONASE) 50 MCG/ACT nasal spray Place 2 sprays into both nostrils daily as needed for allergies or rhinitis.   2 Past Month at Unknown time  . HYDROcodone-acetaminophen (NORCO/VICODIN) 5-325 MG per tablet Take 1-2 tablets by mouth 2 (two) times daily as needed for moderate pain. (Patient taking differently: Take 0.5 tablets by mouth at bedtime as needed for moderate pain. ) 60 tablet 0 Past Month at Unknown time  . loperamide (IMODIUM A-D) 2 MG tablet Take 8 mg by mouth 2 (two) times daily.    06/14/2014 at Unknown time  . methylPREDNISolone (MEDROL) 4 MG tablet Take 4 mg by mouth. Decrease Medrol to 2 tabs in the AM, 3 tabs in the PM for 3 days, then 2 tabs twice a day for 3 days, then 1 tab in the AM and 2 tabs in PM for 3 days, Twice daily for 3 days, then 1 tab daily for 3 days, then stop.   06/14/2014 at Unknown time  . Multiple Vitamin (THERA) TABS Take 1 tablet  by mouth daily.   06/14/2014 at Unknown time  . Multiple Vitamins-Minerals (MULTIVITAMIN PO) Take 1 tablet by mouth daily.    06/09/2014 at Unknown time  . nystatin (MYCOSTATIN/NYSTOP) 100000 UNIT/GM POWD Apply topically See admin instructions. PRN  1 over 30 days  . ondansetron (ZOFRAN) 4 MG tablet Take 1 tablet (4 mg total) by mouth 2 (two) times daily as needed for nausea or vomiting. 30 tablet 0 Past Month  at Unknown time  . pantoprazole (PROTONIX) 40 MG tablet Take 1 tablet (40 mg total) by mouth 2 (two) times daily before a meal. 60 tablet 2 06/15/2014 at 0600  . Zinc 50 MG CAPS Take 1 capsule by mouth daily.    06/14/2014 at Unknown time   Assessment: 64 y/o male with Crohn's disease and ileostomy/colostomy admitted with worsening L wrist swelling that has been present since May 2015. 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 Pharmacy consulted to begin vancomycin and ceftazidime for wrist infection. He is afebrile and renal function was normal (Scr 1.04) on 1/27. Blood cultures pending.  Goal of Therapy:  Vancomycin trough level 10-15 mcg/ml  Eradication of infection  Plan:  - Vancomycin 1500 mg IV once then 750 mg IV q12h - Ceftazidime 1 g IV q8h - Monitor renal function, clinical progress, and culture data - Vancomycin trough at steady-state  [facility], [place].D., BCPS Clinical Pharmacist Pager: [phone] 06/19/2014 5:15 PM

## 2014-06-19 NOTE — H&P (Signed)
History and Physical   Lee Klein ZOX:[id number] DOB: August 18, 1950 DOA: 06/19/2014  Referring physician: Dr. Ninetta Lights PCP: [name], MD  Specialists: ortho hand, Dr. [name]  Chief Complaint: left wrist swelling  HPI: Lee Klein is a 64 y.o. male has a past medical history significant for Chron's disease, GERD is being direct admitted from ID clinic. He has a history of synovitis and left wrist pain since 09/2013 with concerns for inflammatory/infectious arthritis. He underwent debridement of his left wrist by Dr. [name] on 05/24/2014, and preliminary cultures grew AFB. He was seen by infectious disease, and started on rifampin and azithromycin. Patient was recently admitted and discharged on 06/13/2014, he was admitted for atypical chest pain, abdominal discomfort and nausea and vomiting, felt to some extent be related to his rifampin, his chest pain  did not appear to be cardiac in etiology, his rifampin was discontinued and he was continued on azithromycin alone. He was seen by GI as an outpatient and just had an endoscopy a few days ago which was fairly unremarkable. He was seen today in Dr. Moshe Cipro office with worsening left wrist swelling, and he was directed for admission for IV antibiotics. Patient denies any fever or chills, he currently denies any chest pain, he denies any abdominal pain, nausea vomiting or diarrhea. He is feeling rather well except for the swelling in his left wrist.  Review of Systems: As per history of present illness, otherwise 10 point review of system negative  Past Medical History  Diagnosis Date  . Crohn's disease   . Hernia, abdominal   . GERD (gastroesophageal reflux disease)   . Anxiety   . Atypical mycobacterial infection of hand 2016    L wrist   Past Surgical History  Procedure Laterality Date  . Ileostomy    . Colonoscopy    . Upper gastrointestinal endoscopy    . Appendectomy    . Colon surgery    . Wrist surgery      right  .  Ileostomy closure N/A 10/23/2013    Procedure: ILEOSTOMY TAKEDOWN, REPAIR OF PARASTOMAL HERNIA.;  Surgeon: Cherylynn Ridges, MD;  Location: [facility] OR;  Service: General;  Laterality: N/A;  . Hernia repair    . Wrist arthroscopy with debridement Left 05/24/2014    Procedure: LEFT ARTHROSCOPY WRIST CULTURE/BIOPSY DEBRIDEMENT;  Surgeon: Cindee Salt, MD;  Location: [facility];  Service: Orthopedics;  Laterality: Left;  . R wrist fracture Right 2007    screws  . Esophagogastroduodenoscopy N/A 06/15/2014    Procedure: ESOPHAGOGASTRODUODENOSCOPY (EGD);  Surgeon: Malissa Hippo, MD;  Location: [facility];  Service: Endoscopy;  Laterality: N/A;   Social History:  reports that he quit smoking about 8 years ago. His smoking use included Cigars. He has quit using smokeless tobacco. He reports that he does not drink alcohol or use illicit drugs.  Allergies  Allergen Reactions  . Ivp Dye [Iodinated Diagnostic Agents]     Joint pain, nausea  . Fish Allergy Nausea And Vomiting  . Shellfish Allergy Swelling  . Demerol Rash    Family History  Problem Relation Age of Onset  . Healthy Mother   . Healthy Daughter   . Healthy Son     Prior to Admission medications   Medication Sig Start Date End Date Taking? Authorizing Provider  Acetaminophen (TYLENOL PO) Take 2 tablets by mouth daily as needed (pain).    Historical Provider, MD  ALPRAZolam Prudy Feeler) 0.5 MG tablet Take 1 tablet (0.5  mg total) by mouth 2 (two) times daily as needed for anxiety. Patient taking differently: Take 0.5 mg by mouth daily as needed for anxiety.  03/13/14   Malissa Hippo, MD  Ascorbic Acid (VITAMIN C) 1000 MG tablet Take 1,000 mg by mouth daily.    Historical Provider, MD  aspirin EC 81 MG tablet Take 1 tablet (81 mg total) by mouth daily. 06/13/14   Maryann Mikhail, DO  azithromycin (ZITHROMAX) 250 MG tablet Take 1 tablet (250 mg total) by mouth daily. 05/30/14   Ginnie Smart, MD  Calcium Carbonate-Vitamin D (CALCIUM  600 + D PO) Take 1 tablet by mouth daily.     Historical Provider, MD  feeding supplement, ENSURE COMPLETE, (ENSURE COMPLETE) LIQD Take 237 mLs by mouth 2 (two) times daily between meals. 06/13/14   Maryann Mikhail, DO  fluconazole (DIFLUCAN) 200 MG tablet Take 1 tablet (200 mg total) by mouth daily. 06/13/14   Maryann Mikhail, DO  fluticasone (FLONASE) 50 MCG/ACT nasal spray Place 2 sprays into both nostrils daily as needed for allergies or rhinitis.  06/05/14   Historical Provider, MD  HYDROcodone-acetaminophen (NORCO/VICODIN) 5-325 MG per tablet Take 1-2 tablets by mouth 2 (two) times daily as needed for moderate pain. Patient taking differently: Take 0.5 tablets by mouth at bedtime as needed for moderate pain.  03/13/14   Malissa Hippo, MD  loperamide (IMODIUM A-D) 2 MG tablet Take 8 mg by mouth 2 (two) times daily.     Historical Provider, MD  methylPREDNISolone (MEDROL) 4 MG tablet Take 4 mg by mouth. Decrease Medrol to 2 tabs in the AM, 3 tabs in the PM for 3 days, then 2 tabs twice a day for 3 days, then 1 tab in the AM and 2 tabs in PM for 3 days, Twice daily for 3 days, then 1 tab daily for 3 days, then stop.    Historical Provider, MD  Multiple Vitamin (THERA) TABS Take 1 tablet by mouth daily.    Historical Provider, MD  Multiple Vitamins-Minerals (MULTIVITAMIN PO) Take 1 tablet by mouth daily.     Historical Provider, MD  nystatin (MYCOSTATIN/NYSTOP) 100000 UNIT/GM POWD Apply topically See admin instructions. PRN 03/13/14   Historical Provider, MD  ondansetron (ZOFRAN) 4 MG tablet Take 1 tablet (4 mg total) by mouth 2 (two) times daily as needed for nausea or vomiting. 06/07/14   Malissa Hippo, MD  pantoprazole (PROTONIX) 40 MG tablet Take 1 tablet (40 mg total) by mouth 2 (two) times daily before a meal. 06/07/14   Malissa Hippo, MD  Zinc 50 MG CAPS Take 1 capsule by mouth daily.     Historical Provider, MD   Physical Exam: Filed Vitals:   06/19/14 1708  BP: 118/99  Pulse: 91    Temp: 97.9 F (36.6 C)  TempSrc: Oral  Resp: 16  Height:  (1.778 m)  Weight: 73.2 kg (161 lb 6 oz)  SpO2: 100%     General:  No apparent distress  Eyes: PERRL, EOMI, no scleral icterus  ENT: moist oropharynx  Neck: supple, no lymphadenopathy  Cardiovascular: regular rate without MRG; 2+ peripheral pulses, no JVD, no peripheral edema  Respiratory: CTA biL, good air movement without wheezing, rhonchi or crackled  Abdomen: soft, non tender to palpation, ostomy bag in place  Skin: no rashes  Musculoskeletal: normal bulk and tone, left wrist with noticeable swelling, decreased ROM, mild tenderness to palpation  Psychiatric: normal mood and affect  Neurologic: non focal  Labs on Admission:  Basic Metabolic Panel:  Recent Labs Lab 06/13/14 0511  NA 134*  K 4.0  CL 105  CO2 27  GLUCOSE 93  BUN 8  CREATININE 1.04  CALCIUM 7.7*   CBC:  Recent Labs Lab 06/13/14 0511  WBC 4.8  HGB 11.8*  HCT 35.0*  MCV 95.1  PLT 220   Assessment/Plan Active Problems:   Crohn's disease   Wrist joint infection   Left wrist swelling  - We'll start empiric antibiotics with vancomycin and ceftazidime per Dr. Ninetta Lights,  - we'll obtain an MRI of the left wrist - I have let Dr. [name] know about patient's admission  Chron's disease - patient is currently on steroids, today's the last 4 mg twice a day, will administer one more dose tonight, start 4 mg daily of Medrol tomorrow and he is to have that for the next 3 days  Nausea/vomiting - resolved, EGD done 3 days ago with reflux esophagitis - continue Protonix  Atypical chest pain - during last admission - resolved  Admission labs pending  Diet: heart Fluids: NS DVT Prophylaxis: Lovenox   Code Status: Full  Family Communication: d/w patient  Disposition Plan: admit to MedSurg  Costin M. Elvera Lennox, MD [facility] Pager [phone]  If 7PM-7AM, please contact night-coverage [email or web address] Password  [facility] 06/19/2014, 5:14 PM

## 2014-06-20 ENCOUNTER — Ambulatory Visit: Payer: Managed Care, Other (non HMO) | Admitting: Infectious Diseases

## 2014-06-20 ENCOUNTER — Encounter (INDEPENDENT_AMBULATORY_CARE_PROVIDER_SITE_OTHER): Payer: Self-pay | Admitting: *Deleted

## 2014-06-20 DIAGNOSIS — M00839 Arthritis due to other bacteria, unspecified wrist: Secondary | ICD-10-CM

## 2014-06-20 DIAGNOSIS — Z113 Encounter for screening for infections with a predominantly sexual mode of transmission: Secondary | ICD-10-CM | POA: Insufficient documentation

## 2014-06-20 DIAGNOSIS — A319 Mycobacterial infection, unspecified: Secondary | ICD-10-CM | POA: Insufficient documentation

## 2014-06-20 DIAGNOSIS — M009 Pyogenic arthritis, unspecified: Secondary | ICD-10-CM | POA: Insufficient documentation

## 2014-06-20 DIAGNOSIS — K5090 Crohn's disease, unspecified, without complications: Secondary | ICD-10-CM

## 2014-06-20 DIAGNOSIS — M869 Osteomyelitis, unspecified: Secondary | ICD-10-CM

## 2014-06-20 LAB — BASIC METABOLIC PANEL
Anion gap: 4 — ABNORMAL LOW (ref 5–15)
BUN: 9 mg/dL (ref 6–23)
CALCIUM: 8.4 mg/dL (ref 8.4–10.5)
CO2: 25 mmol/L (ref 19–32)
Chloride: 110 mmol/L (ref 96–112)
Creatinine, Ser: 0.94 mg/dL (ref 0.50–1.35)
GFR calc Af Amer: >90 mL/min (ref >90)
GFR calc non Af Amer: 88 mL/min — ABNORMAL LOW (ref >90)
Glucose, Bld: 85 mg/dL (ref 70–99)
POTASSIUM: 3.9 mmol/L (ref 3.5–5.1)
SODIUM: 139 mmol/L (ref 135–145)

## 2014-06-20 LAB — CBC
HCT: 27.8 % — ABNORMAL LOW (ref 39.0–52.0)
Hemoglobin: 10 g/dL — ABNORMAL LOW (ref 13.0–17.0)
MCH: 36.2 pg — AB (ref 26.0–34.0)
MCHC: 36 g/dL (ref 30.0–36.0)
MCV: 100.7 fL — AB (ref 78.0–100.0)
Platelets: 273 10*3/uL (ref 150–400)
RBC: 2.76 MIL/uL — AB (ref 4.22–5.81)
RDW: 16.6 % — ABNORMAL HIGH (ref 11.5–15.5)
WBC: 7.9 10*3/uL (ref 4.0–10.5)

## 2014-06-20 MED ORDER — CLARITHROMYCIN 500 MG PO TABS
500.0000 mg | ORAL_TABLET | Freq: Two times a day (BID) | ORAL | Status: DC
  Administered 2014-06-20 – 2014-06-21 (×3): 500 mg via ORAL
  Filled 2014-06-20 (×5): qty 1

## 2014-06-20 MED ORDER — SODIUM CHLORIDE 0.9 % IV SOLN
500.0000 mg | Freq: Four times a day (QID) | INTRAVENOUS | Status: DC
  Filled 2014-06-20 (×3): qty 500

## 2014-06-20 MED ORDER — AZITHROMYCIN 600 MG PO TABS
600.0000 mg | ORAL_TABLET | Freq: Every day | ORAL | Status: DC
  Filled 2014-06-20: qty 1

## 2014-06-20 MED ORDER — DEXTROSE 5 % IV SOLN
2.0000 g | Freq: Four times a day (QID) | INTRAVENOUS | Status: AC
  Administered 2014-06-20 – 2014-06-21 (×4): 2 g via INTRAVENOUS
  Filled 2014-06-20 (×5): qty 2

## 2014-06-20 MED ORDER — MORPHINE SULFATE 2 MG/ML IJ SOLN
2.0000 mg | INTRAMUSCULAR | Status: DC | PRN
  Administered 2014-06-20 – 2014-06-21 (×4): 2 mg via INTRAVENOUS
  Filled 2014-06-20 (×4): qty 1

## 2014-06-20 MED ORDER — LINEZOLID 600 MG PO TABS
600.0000 mg | ORAL_TABLET | Freq: Two times a day (BID) | ORAL | Status: DC
  Administered 2014-06-20 – 2014-06-21 (×3): 600 mg via ORAL
  Filled 2014-06-20 (×5): qty 1

## 2014-06-20 MED ORDER — SODIUM CHLORIDE 0.9 % IJ SOLN
10.0000 mL | INTRAMUSCULAR | Status: DC | PRN
  Administered 2014-06-21: 10 mL
  Filled 2014-06-20: qty 40

## 2014-06-20 MED ORDER — HYDROCODONE-ACETAMINOPHEN 5-325 MG PO TABS
1.0000 | ORAL_TABLET | ORAL | Status: DC | PRN
  Administered 2014-06-20 – 2014-06-21 (×2): 2 via ORAL
  Filled 2014-06-20 (×2): qty 2

## 2014-06-20 NOTE — Consult Note (Signed)
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 Conley Canal   HPI: Lee Klein is an 64 y.o. 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   Patient apparently developed swelling in his left wrist since May which happened after a venipuncture to left wrist he had received prior Depo-Medrol injections have been treated with oral Medrol. In January 72 was admitted and underwent arthroscopy which found an erosive synovitis and a scapholunate ligament tear. He underwent debridement, biopsies. He was found on AFB stain to be 1+.  Hhis pathology showed degenerative features, chronic inflammation, dystrophic calcifications. He was seen by our group on I/13/16 and started on azithromycin and rifampin. He had significant treatment in the edema in his hand while on this regimen but developed nausea vomiting and dizziness. Rifampin was stopped which resulted in improvement of his GI symptoms. Unfortunately his hand worsened off rifampin and he became febrile to 101. He was seen by Dr. Johnnye Sima yesterday was distress at it appears the patient's hand and had admitted to the hospitalist service. MRI of the hand was done which showed:   "Progressive septic arthritis of the wrist and osteomyelitis, with septic arthritis of the distal radial ulnar joint and carpometacarpal joints. Development of small soft tissue abscesses around the second and fourth metacarpals. "  The mycobacterial species was unfortunately identified as being Mycobacterium abscesses. He's been seen by his hand surgeon Dr. Fredna Dow who is planning surgery as an outpatient due to logistical issues if I understand things correctly.    Past Medical History  Diagnosis Date  .  Crohn's disease   . Hernia, abdominal     "repaired 10/2013"  . GERD (gastroesophageal reflux disease)   . Anxiety   . 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 Kidney stones     "passed them"    Past Surgical History  Procedure Laterality Date  . Ileostomy  06/2010; 10/2013  . Colonoscopy    . Upper gastrointestinal endoscopy    . Appendectomy  ~ 2009  . Ileostomy closure N/A 10/23/2013    Procedure: ILEOSTOMY TAKEDOWN, REPAIR OF PARASTOMAL HERNIA.;  Surgeon: Gwenyth Ober, MD;  Location: [place];  Service: General;  Laterality: N/A;  . Wrist arthroscopy with debridement Left 05/24/2014    Procedure: LEFT ARTHROSCOPY WRIST CULTURE/BIOPSY DEBRIDEMENT;  Surgeon: Daryll Brod, MD;  Location: [place];  Service: Orthopedics;  Laterality: Left;  . Wrist fracture surgery Right 06/2006    Archie Endo 09/29/2010  . Esophagogastroduodenoscopy N/A 06/15/2014    Procedure: ESOPHAGOGASTRODUODENOSCOPY (EGD);  Surgeon: Rogene Houston, MD;  [facility];  Service: Endoscopy;  Laterality: N/A;  . Cholecystectomy  ~ 2008  . Tonsillectomy  1963  . Fracture surgery    . Hernia repair  10/2013    parastomal/notes 10/03/2013  . Colon surgery  ~ 2010    for a colonic stricture at the anastomosis from a colostomy reversal/notes 10/03/2013  . Parastomal hernia repair  10/2013  ergies:   Allergies  Allergen Reactions  . Ivp Dye [Iodinated Diagnostic Agents]     Joint pain, nausea  . Fish Allergy Nausea And Vomiting  . Rifampin Nausea And Vomiting and Other (See Comments)  Chills  . Shellfish Allergy Swelling  . Demerol Rash     Medications: I have reviewed patients current medications as documented in Epic Anti-infectives    Start     Dose/Rate Route Frequency Ordered Stop   06/20/14 1700  imipenem-cilastatin (PRIMAXIN) 500 mg in sodium chloride 0.9 % 100 mL IVPB     500 mg200 mL/hr over 30 Minutes Intravenous 4 times per day 06/20/14 1600     06/20/14 1630   azithromycin (ZITHROMAX) tablet 600 mg    Comments:  Dose recommended by Onnie Boer for pt M abscessus   600 mg Oral Daily 06/20/14 1539     06/20/14 1630  linezolid (ZYVOX) tablet 600 mg     600 mg Oral Every 12 hours 06/20/14 1540     06/20/14 0600  vancomycin (VANCOCIN) IVPB 750 mg/150 ml premix  Status:  Discontinued     750 mg150 mL/hr over 60 Minutes Intravenous Every 12 hours 06/19/14 1726 06/20/14 1227   06/19/14 1800  vancomycin (VANCOCIN) 1,500 mg in sodium chloride 0.9 % 500 mL IVPB     1,500 mg250 mL/hr over 120 Minutes Intravenous  Once 06/19/14 1726 06/19/14 2224   06/19/14 1800  cefTAZidime (FORTAZ) 1 g in dextrose 5 % 50 mL IVPB  Status:  Discontinued     1 g100 mL/hr over 30 Minutes Intravenous Every 8 hours 06/19/14 1726 06/20/14 1227   06/19/14 1730  azithromycin (ZITHROMAX) tablet 250 mg  Status:  Discontinued     250 mg Oral Daily 06/19/14 1726 06/20/14 1539      Social History:  reports that he quit smoking about 8 years ago. His smoking use included Cigars. He has never used smokeless tobacco. He reports that he does not drink alcohol or use illicit drugs.  Family History  Problem Relation Age of Onset  . Healthy Mother   . Healthy Daughter   . Healthy Son     As in HPI and primary teams notes otherwise 12 point review of systems is negative  Blood pressure 132/81, pulse 97, temperature 97.9 F (36.6 C), temperature source Oral, resp. rate 18, height 5' 10" (1.778 m), weight 161 lb 6 oz (73.2 kg), SpO2 96 %. General: Alert and awake, oriented x3, not in any acute distress. HEENT: anicteric sclera, pupils reactive to light and accommodation, EOMI, oropharynx clear and without exudate CVS regular rate, normal r,  no murmur rubs or gallops Chest: clear to auscultation bilaterally, no wheezing, rales or rhonchi Abdomen: soft nontender, nondistended, normal bowel sounds, Extremities: edema of left hand with tenderness, warmth  06/20/14:        Neuro:  nonfocal, strength and sensation intact   Results for orders placed or performed during the hospital encounter of 06/19/14 (from the past 48 hour(s))  Comprehensive metabolic panel     Status: Abnormal   Collection Time: 06/19/14  6:16 PM  Result Value Ref Range   Sodium 137 135 - 145 mmol/L   Potassium 3.9 3.5 - 5.1 mmol/L   Chloride 105 96 - 112 mmol/L   CO2 27 19 - 32 mmol/L   Glucose, Bld 85 70 - 99 mg/dL   BUN 11 6 - 23 mg/dL   Creatinine, Ser 1.01 0.50 - 1.35 mg/dL   Calcium 8.6 8.4 - 10.5 mg/dL   Total Protein 6.1 6.0 - 8.3 g/dL   Albumin 2.7 (L) 3.5 - 5.2 g/dL   AST 23 0 - 37 U/L   ALT 26 0 - 53 U/L  Alkaline Phosphatase 81 39 - 117 U/L   Total Bilirubin 0.2 (L) 0.3 - 1.2 mg/dL   GFR calc non Af Amer 78 (L) >90 mL/min   GFR calc Af Amer >90 >90 mL/min    Comment: (NOTE) The eGFR has been calculated using the CKD EPI equation. This calculation has not been validated in all clinical situations. eGFR's persistently <90 mL/min signify possible Chronic Kidney Disease.    Anion gap 5 5 - 15  CBC WITH DIFFERENTIAL     Status: Abnormal   Collection Time: 06/19/14  6:16 PM  Result Value Ref Range   WBC 10.5 4.0 - 10.5 K/uL   RBC 3.23 (L) 4.22 - 5.81 MIL/uL   Hemoglobin 10.8 (L) 13.0 - 17.0 g/dL   HCT 32.0 (L) 39.0 - 52.0 %   MCV 99.1 78.0 - 100.0 fL   MCH 33.4 26.0 - 34.0 pg   MCHC 33.8 30.0 - 36.0 g/dL   RDW 16.7 (H) 11.5 - 15.5 %   Platelets 295 150 - 400 K/uL   Neutrophils Relative % 68 43 - 77 %   Neutro Abs 7.0 1.7 - 7.7 K/uL   Lymphocytes Relative 19 12 - 46 %   Lymphs Abs 2.0 0.7 - 4.0 K/uL   Monocytes Relative 13 (H) 3 - 12 %   Monocytes Absolute 1.4 (H) 0.1 - 1.0 K/uL   Eosinophils Relative 0 0 - 5 %   Eosinophils Absolute 0.0 0.0 - 0.7 K/uL   Basophils Relative 0 0 - 1 %   Basophils Absolute 0.0 0.0 - 0.1 K/uL  CBC     Status: Abnormal   Collection Time: 06/20/14  5:12 AM  Result Value Ref Range   WBC 7.9 4.0 - 10.5 K/uL   RBC 2.76 (L) 4.22 - 5.81  MIL/uL   Hemoglobin 10.0 (L) 13.0 - 17.0 g/dL   HCT 27.8 (L) 39.0 - 52.0 %   MCV 100.7 (H) 78.0 - 100.0 fL   MCH 36.2 (H) 26.0 - 34.0 pg   MCHC 36.0 30.0 - 36.0 g/dL   RDW 16.6 (H) 11.5 - 15.5 %   Platelets 273 150 - 400 K/uL  Basic metabolic panel     Status: Abnormal   Collection Time: 06/20/14  5:12 AM  Result Value Ref Range   Sodium 139 135 - 145 mmol/L   Potassium 3.9 3.5 - 5.1 mmol/L   Chloride 110 96 - 112 mmol/L   CO2 25 19 - 32 mmol/L   Glucose, Bld 85 70 - 99 mg/dL   BUN 9 6 - 23 mg/dL   Creatinine, Ser 0.94 0.50 - 1.35 mg/dL   Calcium 8.4 8.4 - 10.5 mg/dL   GFR calc non Af Amer 88 (L) >90 mL/min   GFR calc Af Amer >90 >90 mL/min    Comment: (NOTE) The eGFR has been calculated using the CKD EPI equation. This calculation has not been validated in all clinical situations. eGFR's persistently <90 mL/min signify possible Chronic Kidney Disease.    Anion gap 4 (L) 5 - 15   _0 (sdes,specrequest,cult,reptstatus)   ) Recent Results (from the past 720 hour(s))  Anaerobic culture     Status: None   Collection Time: 05/24/14  2:37 PM  Result Value Ref Range Status   Specimen Description FLUID WRIST SYNOVIAL LEFT  Final   Special Requests NONE  Final   Gram Stain   Final    FEW WBC PRESENT,BOTH PMN AND MONONUCLEAR NO ORGANISMS SEEN Performed at [facility]    Culture   Final    NO ANAEROBES ISOLATED Performed at [facility]    Report Status 05/29/2014 FINAL  Final  Fungus Culture with Smear     Status: None (Preliminary result)   Collection Time: 05/24/14  2:37 PM  Result Value Ref Range Status   Specimen Description FLUID WRIST SYNOVIAL LEFT  Final   Special Requests NONE  Final   Fungal Smear   Final    NO YEAST OR FUNGAL ELEMENTS SEEN Performed at [facility]    Culture   Final    CULTURE IN PROGRESS FOR FOUR WEEKS Performed at [facility]    Report Status PENDING  Incomplete  Body fluid culture      Status: None (Preliminary result)   Collection Time: 05/24/14  2:37 PM  Result Value Ref Range Status   Specimen Description FLUID WRIST SYNOVIAL LEFT  Final   Special Requests NONE  Final   Gram Stain   Final    FEW WBC PRESENT,BOTH PMN AND MONONUCLEAR NO ORGANISMS SEEN Performed at [facility]    Culture   Final    FEW ACID FAST BACILLI ISOLATED 9615 Note: REFER TO PREVIOUS CULTURE [id number] FOR CALLS IDENTIFICATION TO FOLLOW FAXED TO 375 Performed at [facility]    Report Status PENDING  Incomplete  AFB culture with smear     Status: None (Preliminary result)   Collection Time: 05/24/14  2:37 PM  Result Value Ref Range Status   Specimen Description FLUID WRIST SYNOVIAL LEFT  Final   Special Requests NONE  Final   Acid Fast Smear   Final    1681 1+ ACID FAST BACILLI SEEN CRITICAL RESULT CALLED TO, READ BACK BY AND VERIFIED WITH: ALICIA 53:20 06/20/32 [place] FAXED TO 544 Performed at [facility]    Culture   Final    MYCOBACTERIUM ABSCESSUS Note: CRITICAL RESULT CALLED TO, READ BACK BY AND VERIFIED WITH: DR.HATCHER @ 3568 ON 06/19/14 BY WILSN SUSCEPTIBILITY TO FOLLOW Performed at [facility]    Report Status PENDING  Incomplete  Urine culture     Status: None   Collection Time: 06/11/14  2:56 PM  Result Value Ref Range Status   Specimen Description URINE, CLEAN CATCH  Final   Special Requests NONE  Final   Colony Count NO GROWTH Performed at [facility]   Final   Culture NO GROWTH Performed at [facility]   Final   Report Status 06/12/2014 FINAL  Final  Clostridium Difficile by PCR     Status: None   Collection Time: 06/11/14  6:49 PM  Result Value Ref Range Status   C difficile by pcr NEGATIVE NEGATIVE Final     Impression/Recommendation  Active Problems:   Crohn's disease   Wrist joint infection   Lee Klein is a 64 y.o. 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 It is highly resistant to antimicrobial therapy.  In order for this patient have a chance at cure he is going to need very aggressive surgical management.  My preference personally would be for this to be done sooner rather than later but the bottom line is that he needs as an aggressive surgery as possible.  In terms of antibiotics that we would pick macrolides have the most activity  and wide initially considered azithromycin I think we should try clarithromycin at first and CV can tolerate this.  I was reminded to use imipenem for potential better dosing convenience but I'm finding in some references that cefoxitin may be a more reliable drug  Amikacin is also routinely used for these types of infections but holds a high risk for nephrotoxicity and ototoxicity  Therefore I will instead opt to use Linezolid as the third drug  I AM encouraged that he had response to macrolide with rifampin initially but unfortunately I have fear of risk of inducible macrolide gene that this species can carry  He is going to need months of protrracted therapy with success depending BOTH on how thoroughly the area can be debrided and our fortune with re to what antimicrobials are active.  We will  Need to make sure the isolates are sent for susceptibility testing.  I'll also test his with Brigitte Pulse at [facility] was an expert in treating mycobacterial infections  I spent greater than 60 minutes with the patient including greater than 50% of time in face to face counsel of the patient, his wife, and their daughter and in coordination of their care.    I discussed the case with my partner Dr. Johnnye Sima   06/20/2014, 4:28 PM   Thank you so much for this interesting consult  [place] [facility] [place] [phone]  (pager) [phone] (office) 06/20/2014, 4:28 PM  Rhina [place] 06/20/2014, 4:28 PM

## 2014-06-20 NOTE — Progress Notes (Signed)
[facility] PROGRESS NOTE  Lee Klein ZOX:[id number] DOB: 1951/01/15 DOA: 06/19/2014 PCP: Lee Pandy, MD  Assessment/Plan:  Wrist joint infection: mycobacteria abscessus: discussed with Lee Klein. Lost IV access. Needs PICC line for prolonged IV therapies per Dr. Algis Klein.  His note is pending.  Patient will f/u Dr. Merlyn Klein as outpatient. Pain not well controlled.  Will increase hydrocodone and add prn IV morphine per patient request. Discussed with pt's wife per patient request. Active Problems:   Crohn's disease  HPI/Subjective: Pain not well controlled. Requesting morphine.  Objective: Filed Vitals:   06/20/14 1354  BP:   Pulse:   Temp: 97.9 F (36.6 C)  Resp:     Intake/Output Summary (Last 24 hours) at 06/20/14 1631 Last data filed at 06/20/14 0622  Gross per 24 hour  Intake 716.67 ml  Output   1000 ml  Net -283.33 ml   Filed Weights   06/19/14 1708  Weight: 73.2 kg (161 lb 6 oz)    Exam:   General:  Uncomfortable, defensive.  Cardiovascular: RRR without MGR  Respiratory: CTA without WRR  Abdomen: S, NT, ND  Ext: left hand swollen, red, tender.  Basic Metabolic Panel:  Recent Labs Lab 06/19/14 1816 06/20/14 0512  NA 137 139  K 3.9 3.9  CL 105 110  CO2 27 25  GLUCOSE 85 85  BUN 11 9  CREATININE 1.01 0.94  CALCIUM 8.6 8.4   Liver Function Tests:  Recent Labs Lab 06/19/14 1816  AST 23  ALT 26  ALKPHOS 81  BILITOT 0.2*  PROT 6.1  ALBUMIN 2.7*   No results for input(s): LIPASE, AMYLASE in the last 168 hours. No results for input(s): AMMONIA in the last 168 hours. CBC:  Recent Labs Lab 06/19/14 1816 06/20/14 0512  WBC 10.5 7.9  NEUTROABS 7.0  --   HGB 10.8* 10.0*  HCT 32.0* 27.8*  MCV 99.1 100.7*  PLT 295 273   Cardiac Enzymes: No results for input(s): CKTOTAL, CKMB, CKMBINDEX, TROPONINI in the last 168 hours. BNP (last 3 results) No results for input(s): BNP in the last 8760 hours.  ProBNP (last 3  results) No results for input(s): PROBNP in the last 8760 hours.  CBG: No results for input(s): GLUCAP in the last 168 hours.  Recent Results (from the past 240 hour(s))  Urine culture     Status: None   Collection Time: 06/11/14  2:56 PM  Result Value Ref Range Status   Specimen Description URINE, CLEAN CATCH  Final   Special Requests NONE  Final   Colony Count NO GROWTH Performed at [facility]   Final   Culture NO GROWTH Performed at [facility]   Final   Report Status 06/12/2014 FINAL  Final  Clostridium Difficile by PCR     Status: None   Collection Time: 06/11/14  6:49 PM  Result Value Ref Range Status   C difficile by pcr NEGATIVE NEGATIVE Final     Studies: Mr Wrist Left W Wo Contrast  06/20/2014   CLINICAL DATA:  Septic arthritis with increasing wrist pain. Acid-fast bacilli culture from prior debridement of the LEFT wrist on 05/24/2014.  EXAM: MR OF THE LEFT WRIST WITHOUT AND WITH CONTRAST  TECHNIQUE: Multiplanar multisequence MR imaging of the left wrist was performed both before and after the administration of intravenous contrast.  CONTRAST:  15mL MULTIHANCE GADOBENATE DIMEGLUMINE 529 MG/ML IV SOLN  COMPARISON:  03/20/2014.  FINDINGS: Study is degraded by motion artifact. Allowing for  the motion artifact, the study is diagnostic.  Constellation of findings are compatible with progressive septic arthritis of the wrist and osteomyelitis has developed. There are now multiple areas of bone marrow signal abnormality compatible with osteomyelitis. Multiple abscesses are present. Some of the carpal bones demonstrate abnormal signal compatible with septic arthritis and osteomyelitis. The trapezoid bone, lunate bone, triquetrum and pisiform bone show relatively preserved marrow signal.  There is osteomyelitis of the second through third metacarpal bases. The second metacarpal shows marrow signal changes extending to the MCP joint with osteomyelitis of the entire bone.  The third and fourth metacarpals show osteomyelitis of the proximal half.  In the volar aspect of the distal second metacarpal , there is osteomyelitis with complete erosion of the volar cortex and an abscess along the volar surface that measures 7 mm volar to dorsal a and 12 mm transverse. This is the largest abscess and is multi septated, estimated to measure about 15 mm in the long axis of the hand. Smaller approximately 1 cm abscess is present along the ulnar aspect of the distal metacarpal metaphysis.  There are no abscesses around the third metacarpal however there is a 1 cm oval abscess in the volar soft tissues at the fourth metacarpal base. Another smaller abscesses interposed between the shaft of the fourth metacarpal and fifth metacarpal.  Wrist effusion and synovitis are present consistent with septic arthritis. Septic arthritis of the distal radioulnar joint is present. Marrow signal changes are present in the distal radius and distal ulna compatible with osteomyelitis.  When compared to the prior MRI 03/20/2014, the septic arthritis and osteomyelitis has progressed. Abscesses have developed in the interim.  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 Development of small soft tissue abscesses around the second and fourth metacarpals. This may represent pyogenic or atypical infection.  These results were called by telephone at the time of interpretation on 06/20/2014 at 8:54 am to Dr. Pamella Klein , who verbally acknowledged these results.   Electronically Signed   By: Andreas Newport M.D.   On: 06/20/2014 08:55    Scheduled Meds: . aspirin EC  81 mg Oral Daily  . azithromycin  600 mg Oral Daily  . enoxaparin (LOVENOX) injection  40 mg Subcutaneous Q24H  . feeding supplement (ENSURE COMPLETE)  237 mL Oral BID BM  . imipenem-cilastatin  500 mg Intravenous 4 times per day  . linezolid   600 mg Oral Q12H  . methylPREDNISolone  4 mg Oral Daily  . pantoprazole  40 mg Oral BID AC  . vitamin C  1,000 mg Oral Daily  . zinc sulfate  220 mg Oral Daily   Continuous Infusions:   Time spent: 25 minutes  Lee Klein L  [facility] [email or web address], password [facility] 06/20/2014, 4:31 PM  LOS: 1 day

## 2014-06-20 NOTE — Progress Notes (Signed)
ANTIBIOTIC CONSULT NOTE - INITIAL  Pharmacy Consult:  Primaxin Indication:  M. abscessus infection  Allergies  Allergen Reactions  . Ivp Dye [Iodinated Diagnostic Agents]     Joint pain, nausea  . Fish Allergy Nausea And Vomiting  . Rifampin Nausea And Vomiting and Other (See Comments)    Chills  . Shellfish Allergy Swelling  . Demerol Rash    Patient Measurements: Height:  (177.8 cm) Weight: 161 lb 6 oz (73.2 kg) IBW/kg (Calculated) : 73  Vital Signs: Temp: 97.9 F (36.6 C) (02/03 1354) Temp Source: Oral (02/03 1354) BP: 132/81 mmHg (02/03 1257) Pulse Rate: 97 (02/03 1257) Intake/Output from previous day: 02/02 0701 - 02/03 0700 In: 716.7 [P.O.:240; I.V.:376.7; IV Piggyback:100] Out: 1000 [Urine:1000]  Labs:  Recent Labs  06/19/14 1816 06/20/14 0512  WBC 10.5 7.9  HGB 10.8* 10.0*  PLT 295 273  CREATININE 1.01 0.94   Estimated Creatinine Clearance: 85.2 mL/min (by C-G formula based on Cr of 0.94). No results for input(s): VANCOTROUGH, VANCOPEAK, VANCORANDOM, GENTTROUGH, GENTPEAK, GENTRANDOM, TOBRATROUGH, TOBRAPEAK, TOBRARND, AMIKACINPEAK, AMIKACINTROU, AMIKACIN in the last 72 hours.   Microbiology: Recent Results (from the past 720 hour(s))  Anaerobic culture     Status: None   Collection Time: 05/24/14  2:37 PM  Result Value Ref Range Status   Specimen Description FLUID WRIST SYNOVIAL LEFT  Final   Special Requests NONE  Final   Gram Stain   Final    FEW WBC PRESENT,BOTH PMN AND MONONUCLEAR NO ORGANISMS SEEN Performed at [facility]    Culture   Final    NO ANAEROBES ISOLATED Performed at [facility]    Report Status 05/29/2014 FINAL  Final  Fungus Culture with Smear     Status: None (Preliminary result)   Collection Time: 05/24/14  2:37 PM  Result Value Ref Range Status   Specimen Description FLUID WRIST SYNOVIAL LEFT  Final   Special Requests NONE  Final   Fungal Smear   Final    NO YEAST OR FUNGAL ELEMENTS  SEEN Performed at [facility]    Culture   Final    CULTURE IN PROGRESS FOR FOUR WEEKS Performed at [facility]    Report Status PENDING  Incomplete  Body fluid culture     Status: None (Preliminary result)   Collection Time: 05/24/14  2:37 PM  Result Value Ref Range Status   Specimen Description FLUID WRIST SYNOVIAL LEFT  Final   Special Requests NONE  Final   Gram Stain   Final    FEW WBC PRESENT,BOTH PMN AND MONONUCLEAR NO ORGANISMS SEEN Performed at [facility]    Culture   Final    FEW ACID FAST BACILLI ISOLATED 9615 Note: REFER TO PREVIOUS CULTURE [id number] FOR CALLS IDENTIFICATION TO FOLLOW FAXED TO 375 Performed at [facility]    Report Status PENDING  Incomplete  AFB culture with smear     Status: None (Preliminary result)   Collection Time: 05/24/14  2:37 PM  Result Value Ref Range Status   Specimen Description FLUID WRIST SYNOVIAL LEFT  Final   Special Requests NONE  Final   Acid Fast Smear   Final    1681 1+ ACID FAST BACILLI SEEN CRITICAL RESULT CALLED TO, READ BACK BY AND VERIFIED WITH: ALICIA 15:55 05/25/14 FUENG FAXED TO 544 Performed at [facility]    Culture   Final    MYCOBACTERIUM ABSCESSUS Note: CRITICAL RESULT CALLED TO, READ BACK BY AND  VERIFIED WITH: DR.HATCHER @ 1631 ON 06/19/14 BY WILSN SUSCEPTIBILITY TO FOLLOW Performed at [facility]    Report Status PENDING  Incomplete  Urine culture     Status: None   Collection Time: 06/11/14  2:56 PM  Result Value Ref Range Status   Specimen Description URINE, CLEAN CATCH  Final   Special Requests NONE  Final   Colony Count NO GROWTH Performed at [facility]   Final   Culture NO GROWTH Performed at [facility]   Final   Report Status 06/12/2014 FINAL  Final  Clostridium Difficile by PCR     Status: None   Collection Time: 06/11/14  6:49 PM  Result Value Ref Range Status   C difficile by pcr NEGATIVE NEGATIVE Final    Medical  History: Past Medical History  Diagnosis Date  . Crohn's disease   . Hernia, abdominal     "repaired 10/2013"  . GERD (gastroesophageal reflux disease)   . Anxiety   . 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 Kidney stones     "passed them"     Assessment: 27 YOM with Mycobacterial left wrist infection to transition from vancomycin and ceftazidime to Zyvox and Primaxin.  Patient's renal function has been stable.  Vanc 2/2 >> 2/3 Lee Klein 2/2 >> 2/3 Primaxin 2/3 >> Zyvox 2/3 >> Azith 2/3 >>  2/2 BCx x2 -    Goal of Therapy:  Clearance of infection   Plan:  - Primaxin 500mg  IV Q6H - Continue Zyvox 600mg  PO BID and azithromycin 600mg  PO daily per MD - Monitor renal fxn, clinical progress   Lee Klein D. Laney Potash, PharmD, BCPS Pager:  [phone] 06/20/2014, 4:01 PM

## 2014-06-20 NOTE — Progress Notes (Signed)
Peripherally Inserted Central Catheter/Midline Placement  The IV Nurse has discussed with the patient and/or persons authorized to consent for the patient, the purpose of this procedure and the potential benefits and risks involved with this procedure.  The benefits include less needle sticks, lab draws from the catheter and patient may be discharged home with the catheter.  Risks include, but not limited to, infection, bleeding, blood clot (thrombus formation), and puncture of an artery; nerve damage and irregular heat beat.  Alternatives to this procedure were also discussed.  PICC/Midline Placement Documentation        Timmothy Sours 06/20/2014, 5:26 PM

## 2014-06-20 NOTE — Consult Note (Signed)
  I have spoken with Dr Ninetta Lights regarding Mr Lee Klein and his infection. We agreed to have him discharged and treat his infection as an outpatient next week. We will plan on a thorough debridement due to his antibiotic difficulties. Please have [name] contact the office for scheduling surgery.

## 2014-06-20 NOTE — Consult Note (Signed)
Lee Klein is an 64 y.o. male.   Chief Complaint: atypical mycobacterial infection left wrist HPI: biopsy of wrist for diagnosis Jan 2016  Past Medical History  Diagnosis Date  . Crohn's disease   . Hernia, abdominal     "repaired 10/2013"  . GERD (gastroesophageal reflux disease)   . Anxiety   . 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 Kidney stones     "passed them"    Past Surgical History  Procedure Laterality Date  . Ileostomy  06/2010; 10/2013  . Colonoscopy    . Upper gastrointestinal endoscopy    . Appendectomy  ~ 2009  . Ileostomy closure N/A 10/23/2013    Procedure: ILEOSTOMY TAKEDOWN, REPAIR OF PARASTOMAL HERNIA.;  Surgeon: Gwenyth Ober, MD;  Location: [facility];  Service: General;  Laterality: N/A;  . Wrist arthroscopy with debridement Left 05/24/2014    Procedure: LEFT ARTHROSCOPY WRIST CULTURE/BIOPSY DEBRIDEMENT;  Surgeon: Daryll Brod, MD;  Location: [facility];  Service: Orthopedics;  Laterality: Left;  . Wrist fracture surgery Right 06/2006    [facility] End[facility] 09/29/2010  . Esophagogastroduodenoscopy N/A 06/15/2014    Procedure: ESOPHAGOGASTRODUODENOSCOPY (EGD);  Surgeon: Rogene Houston, MD;  [facility];  Service: Endoscopy;  Laterality: N/A;  . Cholecystectomy  ~ 2008  . Tonsillectomy  1963  . Fracture surgery    . Hernia repair  10/2013    parastomal/notes 10/03/2013  . Colon surgery  ~ 2010    for a colonic stricture at the anastomosis from a colostomy reversal/notes 10/03/2013  . Parastomal hernia repair  10/2013    Family History  Problem Relation Age of Onset  . Healthy Mother   . Healthy Daughter   . Healthy Son    Social History:  reports that he quit smoking about 8 years ago. His smoking use included Cigars. He has never used smokeless tobacco. He reports that he does not drink alcohol or use illicit drugs.  Allergies:  Allergies  Allergen Reactions  . Ivp Dye [Iodinated Diagnostic Agents]     Joint pain, nausea   . Fish Allergy Nausea And Vomiting  . Rifampin Nausea And Vomiting and Other (See Comments)    Chills  . Shellfish Allergy Swelling  . Demerol Rash    Medications Prior to Admission  Medication Sig Dispense Refill  . acetaminophen (TYLENOL) 325 MG tablet Take 650 mg by mouth every 6 (six) hours as needed (pain).    Marland Kitchen ALPRAZolam (XANAX) 0.5 MG tablet Take 1 tablet (0.5 mg total) by mouth 2 (two) times daily as needed for anxiety. (Patient taking differently: Take 0.5 mg by mouth daily as needed for anxiety. ) 60 tablet 2  . Ascorbic Acid (VITAMIN C) 1000 MG tablet Take 1,000 mg by mouth daily.    Marland Kitchen aspirin EC 81 MG tablet Take 1 tablet (81 mg total) by mouth daily.    . Calcium Carbonate-Vitamin D (CALCIUM 600 + D PO) Take 1 tablet by mouth daily.     . feeding supplement, ENSURE COMPLETE, (ENSURE COMPLETE) LIQD Take 237 mLs by mouth 2 (two) times daily between meals.    . fluconazole (DIFLUCAN) 200 MG tablet Take 1 tablet (200 mg total) by mouth daily. 4 tablet 0  . fluticasone (FLONASE) 50 MCG/ACT nasal spray Place 2 sprays into both nostrils daily as needed for allergies or rhinitis.   2  . HYDROcodone-acetaminophen (NORCO/VICODIN) 5-325 MG per tablet Take 1-2 tablets by mouth 2 (two)  times daily as needed for moderate pain. (Patient taking differently: Take 0.5 tablets by mouth at bedtime as needed for moderate pain. ) 60 tablet 0  . loperamide (IMODIUM A-D) 2 MG tablet Take 8 mg by mouth 2 (two) times daily.     . methylPREDNISolone (MEDROL) 4 MG tablet Take 4 mg by mouth. Decrease Medrol to 2 tabs in the AM, 3 tabs in the PM for 3 days, then 2 tabs twice a day for 3 days, then 1 tab in the AM and 2 tabs in PM for 3 days, Twice daily for 3 days, then 1 tab daily for 3 days, then stop.    . Multiple Vitamins-Minerals (MULTIVITAMIN PO) Take 1 tablet by mouth daily.     . ondansetron (ZOFRAN) 4 MG tablet Take 1 tablet (4 mg total) by mouth 2 (two) times daily as needed for nausea or vomiting.  30 tablet 0  . pantoprazole (PROTONIX) 40 MG tablet Take 1 tablet (40 mg total) by mouth 2 (two) times daily before a meal. 60 tablet 2  . Zinc 50 MG CAPS Take 1 capsule by mouth daily.     Marland Kitchen nystatin (MYCOSTATIN/NYSTOP) 100000 UNIT/GM POWD Apply topically See admin instructions. PRN  1    Results for orders placed or performed during the hospital encounter of 06/19/14 (from the past 48 hour(s))  Comprehensive metabolic panel     Status: Abnormal   Collection Time: 06/19/14  6:16 PM  Result Value Ref Range   Sodium 137 135 - 145 mmol/L   Potassium 3.9 3.5 - 5.1 mmol/L   Chloride 105 96 - 112 mmol/L   CO2 27 19 - 32 mmol/L   Glucose, Bld 85 70 - 99 mg/dL   BUN 11 6 - 23 mg/dL   Creatinine, Ser 1.01 0.50 - 1.35 mg/dL   Calcium 8.6 8.4 - 10.5 mg/dL   Total Protein 6.1 6.0 - 8.3 g/dL   Albumin 2.7 (L) 3.5 - 5.2 g/dL   AST 23 0 - 37 U/L   ALT 26 0 - 53 U/L   Alkaline Phosphatase 81 39 - 117 U/L   Total Bilirubin 0.2 (L) 0.3 - 1.2 mg/dL   GFR calc non Af Amer 78 (L) >90 mL/min   GFR calc Af Amer >90 >90 mL/min    Comment: (NOTE) The eGFR has been calculated using the CKD EPI equation. This calculation has not been validated in all clinical situations. eGFR's persistently <90 mL/min signify possible Chronic Kidney Disease.    Anion gap 5 5 - 15  CBC WITH DIFFERENTIAL     Status: Abnormal   Collection Time: 06/19/14  6:16 PM  Result Value Ref Range   WBC 10.5 4.0 - 10.5 K/uL   RBC 3.23 (L) 4.22 - 5.81 MIL/uL   Hemoglobin 10.8 (L) 13.0 - 17.0 g/dL   HCT 32.0 (L) 39.0 - 52.0 %   MCV 99.1 78.0 - 100.0 fL   MCH 33.4 26.0 - 34.0 pg   MCHC 33.8 30.0 - 36.0 g/dL   RDW 16.7 (H) 11.5 - 15.5 %   Platelets 295 150 - 400 K/uL   Neutrophils Relative % 68 43 - 77 %   Neutro Abs 7.0 1.7 - 7.7 K/uL   Lymphocytes Relative 19 12 - 46 %   Lymphs Abs 2.0 0.7 - 4.0 K/uL   Monocytes Relative 13 (H) 3 - 12 %   Monocytes Absolute 1.4 (H) 0.1 - 1.0 K/uL   Eosinophils Relative 0 0 -  5 %    Eosinophils Absolute 0.0 0.0 - 0.7 K/uL   Basophils Relative 0 0 - 1 %   Basophils Absolute 0.0 0.0 - 0.1 K/uL  CBC     Status: Abnormal   Collection Time: 06/20/14  5:12 AM  Result Value Ref Range   WBC 7.9 4.0 - 10.5 K/uL   RBC 2.76 (L) 4.22 - 5.81 MIL/uL   Hemoglobin 10.0 (L) 13.0 - 17.0 g/dL   HCT 27.8 (L) 39.0 - 52.0 %   MCV 100.7 (H) 78.0 - 100.0 fL   MCH 36.2 (H) 26.0 - 34.0 pg   MCHC 36.0 30.0 - 36.0 g/dL   RDW 16.6 (H) 11.5 - 15.5 %   Platelets 273 150 - 400 K/uL  Basic metabolic panel     Status: Abnormal   Collection Time: 06/20/14  5:12 AM  Result Value Ref Range   Sodium 139 135 - 145 mmol/L   Potassium 3.9 3.5 - 5.1 mmol/L   Chloride 110 96 - 112 mmol/L   CO2 25 19 - 32 mmol/L   Glucose, Bld 85 70 - 99 mg/dL   BUN 9 6 - 23 mg/dL   Creatinine, Ser 0.94 0.50 - 1.35 mg/dL   Calcium 8.4 8.4 - 10.5 mg/dL   GFR calc non Af Amer 88 (L) >90 mL/min   GFR calc Af Amer >90 >90 mL/min    Comment: (NOTE) The eGFR has been calculated using the CKD EPI equation. This calculation has not been validated in all clinical situations. eGFR's persistently <90 mL/min signify possible Chronic Kidney Disease.    Anion gap 4 (L) 5 - 15    Mr Wrist Left W Wo Contrast  06/20/2014   CLINICAL DATA:  Septic arthritis with increasing wrist pain. Acid-fast bacilli culture from prior debridement of the LEFT wrist on 05/24/2014.  EXAM: MR OF THE LEFT WRIST WITHOUT AND WITH CONTRAST  TECHNIQUE: Multiplanar multisequence MR imaging of the left wrist was performed both before and after the administration of intravenous contrast.  CONTRAST:  15m MULTIHANCE GADOBENATE DIMEGLUMINE 529 MG/ML IV SOLN  COMPARISON:  03/20/2014.  FINDINGS: Study is degraded by motion artifact. Allowing for the motion artifact, the study is diagnostic.  Constellation of findings are compatible with progressive septic arthritis of the wrist and osteomyelitis has developed. There are now multiple areas of bone marrow signal  abnormality compatible with osteomyelitis. Multiple abscesses are present. Some of the carpal bones demonstrate abnormal signal compatible with septic arthritis and osteomyelitis. The trapezoid bone, lunate bone, triquetrum and pisiform bone show relatively preserved marrow signal.  There is osteomyelitis of the second through third metacarpal bases. The second metacarpal shows marrow signal changes extending to the MCP joint with osteomyelitis of the entire bone. The third and fourth metacarpals show osteomyelitis of the proximal half.  In the volar aspect of the distal second metacarpal , there is osteomyelitis with complete erosion of the volar cortex and an abscess along the volar surface that measures 7 mm volar to dorsal a and 12 mm transverse. This is the largest abscess and is multi septated, estimated to measure about 15 mm in the long axis of the hand. Smaller approximately 1 cm abscess is present along the ulnar aspect of the distal metacarpal metaphysis.  There are no abscesses around the third metacarpal however there is a 1 cm oval abscess in the volar soft tissues at the fourth metacarpal base. Another smaller abscesses interposed between the shaft of the fourth metacarpal and  fifth metacarpal.  Wrist effusion and synovitis are present consistent with septic arthritis. Septic arthritis of the distal radioulnar joint is present. Marrow signal changes are present in the distal radius and distal ulna compatible with osteomyelitis.  When compared to the prior MRI 03/20/2014, the septic arthritis and osteomyelitis has progressed. Abscesses have developed in the interim.  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 Development of small soft tissue abscesses around the second and fourth metacarpals. This may represent pyogenic or atypical infection.  These results were called by telephone at the  time of interpretation on 06/20/2014 at 8:54 am to Dr. Marzetta Board , who verbally acknowledged these results.   Electronically Signed   By: Dereck Ligas M.D.   On: 06/20/2014 08:55     HISTORY:  Mr. Pillsbury is seen at the request of Dr. Johnnye Sima via the hospitalist.  He was admitted for contamination of his swelling, left hand and wrist.  He underwent arthroscopic partial debridement, synovectomy, but primarily for cultures.  This has revealed mycobacterium abscessus.  There was no growth from fungal or bacterial cultures.  He has been afebrile. He was seen approximately a week ago prior to culture report coming back as atypical mycobacteria.  Although the gram stain showed mycobacterial infection.   His white count as initially elevated to 10.5 and is presently 7.9.   EXAMINATION:   His physical examination reveals swelling of his hand/wrist area, left side.  He has not been wearing his splint.  There is no distinct erythema.  There is increased warmth.  There is no lymphangitis.  RADIOGRAPHS:   His MRI is noted with multi[le abscesses and osteomyelitis progression  ASSESSMENT:    This does not appear to be a bacterial infection.   I suspect this is entirely secondary to his atypical mycobacterial infection.    RECOMMENDATIONS/PLAN:    There is no indication for further surgical intervention at the present time.   Treatment will continue with antibiotics long term which is the treatment for atypical mycobacterial infection.  He will obtain his splint.      Carolle Ishii R 06/20/2014, 9:50 AM

## 2014-06-21 ENCOUNTER — Telehealth: Payer: Self-pay | Admitting: *Deleted

## 2014-06-21 ENCOUNTER — Encounter (HOSPITAL_BASED_OUTPATIENT_CLINIC_OR_DEPARTMENT_OTHER): Payer: Self-pay | Admitting: *Deleted

## 2014-06-21 DIAGNOSIS — Z113 Encounter for screening for infections with a predominantly sexual mode of transmission: Secondary | ICD-10-CM

## 2014-06-21 LAB — FUNGUS CULTURE W SMEAR: Fungal Smear: NONE SEEN

## 2014-06-21 LAB — C-REACTIVE PROTEIN: CRP: 5.9 mg/dL — ABNORMAL HIGH (ref <0.60)

## 2014-06-21 LAB — BODY FLUID CULTURE

## 2014-06-21 LAB — SEDIMENTATION RATE: Sed Rate: 89 mm/hr — ABNORMAL HIGH (ref 0–16)

## 2014-06-21 MED ORDER — SODIUM CHLORIDE 0.9 % IV SOLN: INTRAVENOUS | Status: DC

## 2014-06-21 MED ORDER — ONDANSETRON HCL 4 MG PO TABS
4.0000 mg | ORAL_TABLET | Freq: Four times a day (QID) | ORAL | Status: DC | PRN
Start: 2014-06-21 — End: 2014-06-21

## 2014-06-21 MED ORDER — ONDANSETRON HCL 4 MG PO TABS: 4.0000 mg | ORAL_TABLET | Freq: Four times a day (QID) | ORAL | Status: DC | PRN

## 2014-06-21 MED ORDER — CLARITHROMYCIN 500 MG PO TABS: 500.0000 mg | ORAL_TABLET | Freq: Two times a day (BID) | ORAL | Status: DC

## 2014-06-21 MED ORDER — LINEZOLID 600 MG PO TABS: 600.0000 mg | ORAL_TABLET | Freq: Two times a day (BID) | ORAL | Status: DC

## 2014-06-21 MED ORDER — SODIUM CHLORIDE 0.9 % IV SOLN
INTRAVENOUS | Status: DC
  Administered 2014-06-21: 16:00:00 via INTRAVENOUS
  Filled 2014-06-21 (×2): qty 250

## 2014-06-21 NOTE — Progress Notes (Signed)
Pt in hospital-will be d/c then come here-all labs done

## 2014-06-21 NOTE — Progress Notes (Signed)
[facility]    Subjective: Swelling in hand is going down   Antibiotics:  Anti-infectives    Start     Dose/Rate Route Frequency Ordered Stop   06/21/14 1600  Cefoxitin 12g / NS continuous infusion     13 mL/hr  Intravenous Every 24 hours 06/21/14 1137     06/21/14 0000  cefOXitin 12 g in sodium chloride 0.9 % 250 mL     13 mL/hr    06/21/14 1555     06/21/14 0000  clarithromycin (BIAXIN) 500 MG tablet     500 mg Oral Every 12 hours 06/21/14 1555     06/21/14 0000  linezolid (ZYVOX) 600 MG tablet     600 mg Oral Every 12 hours 06/21/14 1555     06/20/14 2200  clarithromycin (BIAXIN) tablet 500 mg     500 mg Oral Every 12 hours 06/20/14 1638     06/20/14 1800  cefOXitin (MEFOXIN) 2 g in dextrose 5 % 50 mL IVPB     2 g100 mL/hr over 30 Minutes Intravenous 4 times per day 06/20/14 1638 06/21/14 1232   06/20/14 1700  imipenem-cilastatin (PRIMAXIN) 500 mg in sodium chloride 0.9 % 100 mL IVPB  Status:  Discontinued     500 mg200 mL/hr over 30 Minutes Intravenous 4 times per day 06/20/14 1600 06/20/14 1638   06/20/14 1630  azithromycin (ZITHROMAX) tablet 600 mg  Status:  Discontinued    Comments:  Dose recommended by [facility] for pt M abscessus   600 mg Oral Daily 06/20/14 1539 06/20/14 1638   06/20/14 1630  linezolid (ZYVOX) tablet 600 mg     600 mg Oral Every 12 hours 06/20/14 1540     06/20/14 0600  vancomycin (VANCOCIN) IVPB 750 mg/150 ml premix  Status:  Discontinued     750 mg150 mL/hr over 60 Minutes Intravenous Every 12 hours 06/19/14 1726 06/20/14 1227   06/19/14 1800  vancomycin (VANCOCIN) 1,500 mg in sodium chloride 0.9 % 500 mL IVPB     1,500 mg250 mL/hr over 120 Minutes Intravenous  Once 06/19/14 1726 06/19/14 2224   06/19/14 1800  cefTAZidime (FORTAZ) 1 g in dextrose 5 % 50 mL IVPB  Status:  Discontinued     1 g100 mL/hr over 30 Minutes Intravenous Every 8 hours 06/19/14 1726 06/20/14 1227   06/19/14 1730  azithromycin (ZITHROMAX)  tablet 250 mg  Status:  Discontinued     250 mg Oral Daily 06/19/14 1726 06/20/14 1539      Medications: Scheduled Meds: . aspirin EC  81 mg Oral Daily  . Cefoxitin 12g / NS continuous infusion   Intravenous Q24H  . clarithromycin  500 mg Oral Q12H  . enoxaparin (LOVENOX) injection  40 mg Subcutaneous Q24H  . feeding supplement (ENSURE COMPLETE)  237 mL Oral BID BM  . linezolid  600 mg Oral Q12H  . methylPREDNISolone  4 mg Oral Daily  . pantoprazole  40 mg Oral BID AC  . vitamin C  1,000 mg Oral Daily  . zinc sulfate  220 mg Oral Daily   Continuous Infusions:  PRN Meds:.ALPRAZolam, HYDROcodone-acetaminophen, loperamide, morphine injection, sodium chloride    Objective: Weight change:   Intake/Output Summary (Last 24 hours) at 06/21/14 1558 Last data filed at 06/21/14 0943  Gross per 24 hour  Intake    940 ml  Output    850 ml  Net     90 ml   Blood pressure  149/59, pulse 117, temperature 98.2 F (36.8 C), temperature source Oral, resp. rate 17, height 5\' 10"  (1.778 m), weight 161 lb 6 oz (73.2 kg), SpO2 95 %. Temp:  [97.8 F (36.6 C)-99 F (37.2 C)] 98.2 F (36.8 C) (02/04 1300) Pulse Rate:  [91-117] 117 (02/04 1300) Resp:  [17-18] 17 (02/04 1300) BP: (119-149)/(59-72) 149/59 mmHg (02/04 1300) SpO2:  [92 %-95 %] 95 % (02/04 1300)  Physical Exam: General: Alert and awake, oriented x3, not in any acute distress. HEENT: EOMI, oropharynx clear and without exudate CVS regular rate, normal r, no murmur rubs or gallops Chest: clear to auscultation bilaterally, no wheezing, rales or rhonchi Abdomen: soft nontender, nondistended, normal bowel sounds, Extremities: edema of left hand with tenderness, warmth Neuro: nonfocal  CBC:  CBC Latest Ref Rng 06/20/2014 06/19/2014 06/13/2014  WBC 4.0 - 10.5 K/uL 7.9 10.5 4.8  Hemoglobin 13.0 - 17.0 g/dL 10.0(L) 10.8(L) 11.8(L)  Hematocrit 39.0 - 52.0 % 27.8(L) 32.0(L) 35.0(L)  Platelets 150 - 400 K/uL 273 295 220        BMET  Recent Labs  06/19/14 1816 06/20/14 0512  NA 137 139  K 3.9 3.9  CL 105 110  CO2 27 25  GLUCOSE 85 85  BUN 11 9  CREATININE 1.01 0.94  CALCIUM 8.6 8.4     Liver Panel   Recent Labs  06/19/14 1816  PROT 6.1  ALBUMIN 2.7*  AST 23  ALT 26  ALKPHOS 81  BILITOT 0.2*       Sedimentation Rate  Recent Labs  06/21/14 0557  ESRSEDRATE 89*   C-Reactive Protein No results for input(s): CRP in the last 72 hours.  Micro Results: Recent Results (from the past 720 hour(s))  Anaerobic culture     Status: None   Collection Time: 05/24/14  2:37 PM  Result Value Ref Range Status   Specimen Description FLUID WRIST SYNOVIAL LEFT  Final   Special Requests NONE  Final   Gram Stain   Final    FEW WBC PRESENT,BOTH PMN AND MONONUCLEAR NO ORGANISMS SEEN Performed at [facility]    Culture   Final    NO ANAEROBES ISOLATED Performed at [facility]    Report Status 05/29/2014 FINAL  Final  Fungus Culture with Smear     Status: None   Collection Time: 05/24/14  2:37 PM  Result Value Ref Range Status   Specimen Description FLUID WRIST SYNOVIAL LEFT  Final   Special Requests NONE  Final   Fungal Smear   Final    NO YEAST OR FUNGAL ELEMENTS SEEN Performed at [facility]    Culture   Final    No Fungi Isolated in 4 Weeks Performed at [facility]    Report Status 06/21/2014 FINAL  Final  Body fluid culture     Status: None   Collection Time: 05/24/14  2:37 PM  Result Value Ref Range Status   Specimen Description FLUID WRIST SYNOVIAL LEFT  Final   Special Requests NONE  Final   Gram Stain   Final    FEW WBC PRESENT,BOTH PMN AND MONONUCLEAR NO ORGANISMS SEEN Performed at [facility]    Culture   Final    FEW MYCOBACTERIUM ABSCESSUS Note: REFER TO PREVIOUS CULTURE [id number] FOR CALLS Performed at [facility]    Report Status 06/21/2014 FINAL  Final  AFB culture with smear     Status: None  (Preliminary result)   Collection Time: 05/24/14  2:37 PM  Result Value Ref Range Status   Specimen Description FLUID WRIST SYNOVIAL LEFT  Final   Special Requests NONE  Final   Acid Fast Smear   Final    1681 1+ ACID FAST BACILLI SEEN CRITICAL RESULT CALLED TO, READ BACK BY AND VERIFIED WITH: ALICIA 15:55 05/25/14 FUENG FAXED TO 544 Performed at [facility]    Culture   Final    MYCOBACTERIUM ABSCESSUS Note: CRITICAL RESULT CALLED TO, READ BACK BY AND VERIFIED WITH: DR.HATCHER @ 1631 ON 06/19/14 BY WILSN SUSCEPTIBILITY TO FOLLOW Performed at [facility]    Report Status PENDING  Incomplete  Urine culture     Status: None   Collection Time: 06/11/14  2:56 PM  Result Value Ref Range Status   Specimen Description URINE, CLEAN CATCH  Final   Special Requests NONE  Final   Colony Count NO GROWTH Performed at [facility]   Final   Culture NO GROWTH Performed at [facility]   Final   Report Status 06/12/2014 FINAL  Final  Clostridium Difficile by PCR     Status: None   Collection Time: 06/11/14  6:49 PM  Result Value Ref Range Status   C difficile by pcr NEGATIVE NEGATIVE Final  Culture, blood (routine x 2)     Status: None (Preliminary result)   Collection Time: 06/19/14  6:16 PM  Result Value Ref Range Status   Specimen Description BLOOD RIGHT ARM  Final   Special Requests BOTTLES DRAWN AEROBIC AND ANAEROBIC 5CC  Final   Culture   Final           BLOOD CULTURE RECEIVED NO GROWTH TO DATE CULTURE WILL BE HELD FOR 5 DAYS BEFORE ISSUING A FINAL NEGATIVE REPORT Performed at [facility]    Report Status PENDING  Incomplete  Culture, blood (routine x 2)     Status: None (Preliminary result)   Collection Time: 06/19/14  6:23 PM  Result Value Ref Range Status   Specimen Description BLOOD LEFT ARM  Final   Special Requests BOTTLES DRAWN AEROBIC AND ANAEROBIC 5CC  Final   Culture   Final           BLOOD CULTURE RECEIVED NO GROWTH TO DATE  CULTURE WILL BE HELD FOR 5 DAYS BEFORE ISSUING A FINAL NEGATIVE REPORT Performed at [facility]    Report Status PENDING  Incomplete    Studies/Results: Mr Wrist Left W Wo Contrast  06/20/2014   CLINICAL DATA:  Septic arthritis with increasing wrist pain. Acid-fast bacilli culture from prior debridement of the LEFT wrist on 05/24/2014.  EXAM: MR OF THE LEFT WRIST WITHOUT AND WITH CONTRAST  TECHNIQUE: Multiplanar multisequence MR imaging of the left wrist was performed both before and after the administration of intravenous contrast.  CONTRAST:  26mL MULTIHANCE GADOBENATE DIMEGLUMINE 529 MG/ML IV SOLN  COMPARISON:  03/20/2014.  FINDINGS: Study is degraded by motion artifact. Allowing for the motion artifact, the study is diagnostic.  Constellation of findings are compatible with progressive septic arthritis of the wrist and osteomyelitis has developed. There are now multiple areas of bone marrow signal abnormality compatible with osteomyelitis. Multiple abscesses are present. Some of the carpal bones demonstrate abnormal signal compatible with septic arthritis and osteomyelitis. The trapezoid bone, lunate bone, triquetrum and pisiform bone show relatively preserved marrow signal.  There is osteomyelitis of the second through third metacarpal bases. The second metacarpal shows marrow signal changes extending to the MCP joint with  osteomyelitis of the entire bone. The third and fourth metacarpals show osteomyelitis of the proximal half.  In the volar aspect of the distal second metacarpal , there is osteomyelitis with complete erosion of the volar cortex and an abscess along the volar surface that measures 7 mm volar to dorsal a and 12 mm transverse. This is the largest abscess and is multi septated, estimated to measure about 15 mm in the long axis of the hand. Smaller approximately 1 cm abscess is present along the ulnar aspect of the distal metacarpal metaphysis.  There are no abscesses around the  third metacarpal however there is a 1 cm oval abscess in the volar soft tissues at the fourth metacarpal base. Another smaller abscesses interposed between the shaft of the fourth metacarpal and fifth metacarpal.  Wrist effusion and synovitis are present consistent with septic arthritis. Septic arthritis of the distal radioulnar joint is present. Marrow signal changes are present in the distal radius and distal ulna compatible with osteomyelitis.  When compared to the prior MRI 03/20/2014, the septic arthritis and osteomyelitis has progressed. Abscesses have developed in the interim.  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 Development of small soft tissue abscesses around the second and fourth metacarpals. This may represent pyogenic or atypical infection.  These results were called by telephone at the time of interpretation on 06/20/2014 at 8:54 am to Dr. Pamella Pert , who verbally acknowledged these results.   Electronically Signed   By: Andreas Newport M.D.   On: 06/20/2014 08:55      Assessment/Plan:  Active Problems:   Crohn's disease   Wrist joint infection   Mycobacterium infections, atypical   Septic arthritis   Osteomyelitis of left hand   Screening for STD (sexually transmitted disease)    Lee Klein is a 64 y.o. 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 It is highly resistant to antimicrobial therapy.  In order for this patient have a chance at cure he is going to need very aggressive surgical management.  I am very pleased to hear he will go for surgery tomorrow with Dr. [name]  I have worked with Tennis Must, Pharm D, Celedonio Miyamoto Pharm D and we have come up with continuous  infusion of CEFOXITIN 12 grams per day as ideal beta lactam to use for his infection  NOTE my understanding is that because this abx artificially elevated the creatinine it may cause a pseudo-elevation if labs are drawn while it is infusing. If we are worried re accuracy and want accurate serum cr measurement (which we would want weekly) we may have to have the infusion held for 2 hours prior to BMP draw  We will also continue BIAXIN  BID  And ZYVOX  BID  I called Micro lab and requested SENSIS be sent to [facility]  I spent greater than 25 minutes with the patient including greater than 50% of time in face to face counsel of the patient and in coordination of their care.    LOS: 2 days   Acey Lav 06/21/2014, 3:58 PM

## 2014-06-21 NOTE — Progress Notes (Signed)
  Outpatient Parenteral Antimicrobial Therapy (OPAT) Pharmacy Monitoring  Guidelines developed by the Infectious Diseases Society of America (IDSA) recommend that hospitals who send patients home on intravenous (IV) antibiotics have an active performance improvement program.  IDSA guidelines also recommend that an infection specialist, such as a pharmacist, be involved in the evaluation of candidates for discharge with OPAT.      Yes No Comments  Assessed patient for appropriateness of OPAT? [x]  []  Is IV antimicrobial therapy needed? Is the home environment safe and adequate to support care? Are the patient/caregivers willing to participate and able to safely, effectively, and reliably deliver parenteral antimicrobial therapy? Is communication about problems and monitoring of therapy in place with patient? Does the patient/caregiver understand the benefits/risks with OPAT?  Are oral antibiotics an option? []  [x]  Patients diagnosed with endocarditis are not candidates for oral antibiotics.  Is antibiotic selected the best option for treatment indication? [x]  []  Can therapy be de-escalated further?  Is the dose appropriate? []  [x]  Consider renal function and administration schedule for outpatient setting.  Is the length of therapy appropriate? [x]  []  Consider disease-specific guidelines when available.  Recommend changing antibiotic regimen? [x]  []  If yes, recommend cefoxitin 12 gm continuous infusion over 24 hours.    Final antibiotic regimen for discharge is cefoxitin 12 gm IV continous infusion over 24 hours.  Spoke with Dr. Daiva Eves, patient is to continue antibiotics indefinitely for now.  Hopefully surgery can be done sooner rather than later (include drug, dose, route, frequency and duration).  Laboratory monitoring and follow-up for discharge with OPAT: Dr. Daiva Eves recommends CBC/BMET once weekly and to follow-up in [facility] within 1 week of discharge.   Thank you for allowing pharmacy to be a  part of this patient's care team.  Cassie L. Roseanne Reno, PharmD Clinical Pharmacy Resident Pager: [phone] 06/21/2014 11:57 AM

## 2014-06-21 NOTE — Care Management Note (Addendum)
  Page 2 of 2   06/21/2014     8:14:32 AM CARE MANAGEMENT NOTE 06/21/2014  Patient:  Lee Klein, Lee Klein   Account Number:  [id number]  Date Initiated:  06/21/2014  Documentation initiated by:  Ronny Flurry  Subjective/Objective Assessment:     Action/Plan:   Anticipated DC Date:     Anticipated DC Plan:  HOME W HOME HEALTH SERVICES         Choice offered to / List presented to:          [facility] arranged  HH-1 RN  IV Antibiotics      HH agency  [facility]   Status of service:  In process, will continue to follow Medicare Important Message given?   (If response is "NO", the following Medicare IM given date fields will be blank) Date Medicare IM given:   Medicare IM given by:   Date Additional Medicare IM given:   Additional Medicare IM given by:    Discharge Disposition:  HOME W HOME HEALTH SERVICES  Per UR Regulation:  Reviewed for med. necessity/level of care/duration of stay  If discussed at Long Length of Stay Meetings, dates discussed:    Comments:  06-21-14 Benefits check : ZYVOX 600 MG PO BID 30DAY SUPPLY  COVER-YES CO-PAY- $ 40.00  FOR 30 DAY SUPPLY PRIOR APPROVAL - NO PHARMACY- ANY RETAIL OR [facility]  Zyvox co pay assist card given to patient .   Ronny Flurry RN BSN [phone]      06-21-14 Confimed face sheet information with patient . Confirmed [employer] with patient who states his plan is NOT Medicare managed therefore patient can use discount card for  Zyvox.  Awaiting benefits check for  zyvox. Ronny Flurry RN BSN

## 2014-06-21 NOTE — Progress Notes (Signed)
[facility]  Patient Status: New pt for [facility] this admission  [facility] is providing the following services: HHRN and [facility] for home IV ABX.[facility] team will provide in hospital teaching with pt and wife to support independence at hoe with IV ABX set up and administration.  If patient discharges after hours, please call [phone].   Lee Klein 06/21/2014, 10:55 AM

## 2014-06-21 NOTE — Discharge Summary (Signed)
Physician Discharge Summary  Lee Klein:[id number] DOB: June 13, 1950 DOA: 06/19/2014  PCP: [name], MD  Admit date: 06/19/2014 Discharge date: 06/22/2014  Time spent: greater than 30 minutes  Recommendations for Outpatient Follow-up:  1. Home health arranged for PICC line care and IV antibiotics. 2. I & D scheduled for 2/5 in day surgery center.  Discharge Diagnoses:  Active Problems:   Crohn's disease   Wrist joint infection   Mycobacterium infections, atypical   Septic arthritis   Osteomyelitis of left hand    Discharge Condition: stable  Filed Weights   06/19/14 1708  Weight: 73.2 kg (161 lb 6 oz)    History of present illness:  64 y.o. male has a past medical history significant for Chrohn's disease, directly admitted from ID clinic. He has a history of synovitis and left wrist pain since 09/2013 with concerns for inflammatory/infectious arthritis. He underwent debridement of his left wrist by Dr. [name] on 05/24/2014, and AFB culture grew mycobacteria abscessus. He was seen by infectious disease, and started on rifampin and azithromycin. Patient was recently admitted and discharged on 06/13/2014, d for atypical chest pain, abdominal discomfort and nausea and vomiting, felt to some extent be related to his rifampin, his chest pain did not appear to be cardiac in etiology, his rifampin was discontinued and he was continued on azithromycin alone. He was seen by GI as an outpatient and just had an endoscopy a few days ago which was fairly unremarkable. He was seen today in Dr. Moshe Cipro office with worsening left wrist swelling, and he was directed for admission for IV antibiotics. Patient denies any fever or chills, he currently denies any chest pain, he denies any abdominal pain, nausea vomiting or diarrhea. He is feeling rather well except for the swelling in his left wrist.  Hospital Course:  Admitted to hospitalist service.  MRI hand showed Progressive septic arthritis of  the wrist and osteomyelitis, with septic arthritis of the distal radial ulnar joint and carpometacarpal joints. Development of small soft tissue abscesses around the second and fourth metacarpals.  ID and hand surgery consulted.  Picc line placed.  ID recommends IV cefoxitin, oral biaxin, oral zyvox for at least a month, likely longer depending on sensitivity data from previous AFB culture which is currently pending.  Dr. [name] to perform I and D 06/22/14 in day surgery center. At discharge, hand swelling and pain slightly improved.  Procedures:  PICC line  Consultations:  ID  Hand surgery  Discharge Exam: Filed Vitals:   06/21/14 1300  BP: 149/59  Pulse: 117  Temp: 98.2 F (36.8 C)  Resp: 17    General: more comfortable Cardiovascular: RRR Respiratory: CTA Ext: left hand swelling slightly improved.  Discharge Instructions   Discharge Instructions    Activity as tolerated - No restrictions    Complete by:  As directed      Diet general    Complete by:  As directed           Current Discharge Medication List    START taking these medications   Details  cefOXitin 12 g in sodium chloride 0.9 % 250 mL AT LEAST THROUGH 07/20/14, LIKELY LONGER, DO NOT STOP WITHOUT DISCUSSING WITH DR. VANDAMM Qty: 31 each, Refills: 0    clarithromycin (BIAXIN) 500 MG tablet Take 1 tablet (500 mg total) by mouth every 12 (twelve) hours. Qty: 60 tablet, Refills: 1    linezolid (ZYVOX) 600 MG tablet Take 1 tablet (600 mg total) by mouth every  12 (twelve) hours. Qty: 60 tablet, Refills: 1      CONTINUE these medications which have NOT CHANGED   Details  acetaminophen (TYLENOL) 325 MG tablet Take 650 mg by mouth every 6 (six) hours as needed (pain).    ALPRAZolam (XANAX) 0.5 MG tablet Take 1 tablet (0.5 mg total) by mouth 2 (two) times daily as needed for anxiety. Qty: 60 tablet, Refills: 2   Associated Diagnoses: Stress    Ascorbic Acid (VITAMIN C) 1000 MG tablet Take 1,000 mg by mouth  daily.    aspirin EC 81 MG tablet Take 1 tablet (81 mg total) by mouth daily.    Calcium Carbonate-Vitamin D (CALCIUM 600 + D PO) Take 1 tablet by mouth daily.     feeding supplement, ENSURE COMPLETE, (ENSURE COMPLETE) LIQD Take 237 mLs by mouth 2 (two) times daily between meals.    fluticasone (FLONASE) 50 MCG/ACT nasal spray Place 2 sprays into both nostrils daily as needed for allergies or rhinitis.  Refills: 2    HYDROcodone-acetaminophen (NORCO/VICODIN) 5-325 MG per tablet Take 1-2 tablets by mouth 2 (two) times daily as needed for moderate pain. Qty: 60 tablet, Refills: 0   Associated Diagnoses: Chronic abdominal pain    loperamide (IMODIUM A-D) 2 MG tablet Take 8 mg by mouth 2 (two) times daily.     Multiple Vitamins-Minerals (MULTIVITAMIN PO) Take 1 tablet by mouth daily.     ondansetron (ZOFRAN) 4 MG tablet Take 1 tablet (4 mg total) by mouth 2 (two) times daily as needed for nausea or vomiting. Qty: 30 tablet, Refills: 0   Associated Diagnoses: Nausea without vomiting    pantoprazole (PROTONIX) 40 MG tablet Take 1 tablet (40 mg total) by mouth 2 (two) times daily before a meal. Qty: 60 tablet, Refills: 2   Associated Diagnoses: Abdominal pain, epigastric    Zinc 50 MG CAPS Take 1 capsule by mouth daily.     nystatin (MYCOSTATIN/NYSTOP) 100000 UNIT/GM POWD Apply topically See admin instructions. PRN Refills: 1      STOP taking these medications     fluconazole (DIFLUCAN) 200 MG tablet      methylPREDNISolone (MEDROL) 4 MG tablet        Allergies  Allergen Reactions  . Ivp Dye [Iodinated Diagnostic Agents]     Joint pain, nausea  . Fish Allergy Nausea And Vomiting  . Rifampin Nausea And Vomiting and Other (See Comments)    Chills  . Shellfish Allergy Swelling  . Demerol Rash   Follow-up Information    Follow up with Acey Lav, MD.   Specialty:  Infectious Diseases   Contact information:   [address] [place]  [address] [phone]       Follow up with Nicki Reaper, MD.   Specialty:  Orthopedic Surgery   Contact information:   [address] [place] [address] [phone]        The results of significant diagnostics from this hospitalization (including imaging, microbiology, ancillary and laboratory) are listed below for reference.    Significant Diagnostic Studies: Mr Wrist Left W Wo Contrast  06/20/2014   CLINICAL DATA:  Septic arthritis with increasing wrist pain. Acid-fast bacilli culture from prior debridement of the LEFT wrist on 05/24/2014.  EXAM: MR OF THE LEFT WRIST WITHOUT AND WITH CONTRAST  TECHNIQUE: Multiplanar multisequence MR imaging of the left wrist was performed both before and after the administration of intravenous contrast.  CONTRAST:  15mL MULTIHANCE GADOBENATE DIMEGLUMINE 529  MG/ML IV SOLN  COMPARISON:  03/20/2014.  FINDINGS: Study is degraded by motion artifact. Allowing for the motion artifact, the study is diagnostic.  Constellation of findings are compatible with progressive septic arthritis of the wrist and osteomyelitis has developed. There are now multiple areas of bone marrow signal abnormality compatible with osteomyelitis. Multiple abscesses are present. Some of the carpal bones demonstrate abnormal signal compatible with septic arthritis and osteomyelitis. The trapezoid bone, lunate bone, triquetrum and pisiform bone show relatively preserved marrow signal.  There is osteomyelitis of the second through third metacarpal bases. The second metacarpal shows marrow signal changes extending to the MCP joint with osteomyelitis of the entire bone. The third and fourth metacarpals show osteomyelitis of the proximal half.  In the volar aspect of the distal second metacarpal , there is osteomyelitis with complete erosion of the volar cortex and an abscess along the volar surface that measures 7 mm volar to dorsal a and 12 mm transverse. This is the largest abscess and is multi  septated, estimated to measure about 15 mm in the long axis of the hand. Smaller approximately 1 cm abscess is present along the ulnar aspect of the distal metacarpal metaphysis.  There are no abscesses around the third metacarpal however there is a 1 cm oval abscess in the volar soft tissues at the fourth metacarpal base. Another smaller abscesses interposed between the shaft of the fourth metacarpal and fifth metacarpal.  Wrist effusion and synovitis are present consistent with septic arthritis. Septic arthritis of the distal radioulnar joint is present. Marrow signal changes are present in the distal radius and distal ulna compatible with osteomyelitis.  When compared to the prior MRI 03/20/2014, the septic arthritis and osteomyelitis has progressed. Abscesses have developed in the interim.  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 Development of small soft tissue abscesses around the second and fourth metacarpals. This may represent pyogenic or atypical infection.  These results were called by telephone at the time of interpretation on 06/20/2014 at 8:54 am to Dr. Pamella Pert , who verbally acknowledged these results.   Electronically Signed   By: Andreas Newport M.D.   On: 06/20/2014 08:55    Microbiology: Recent Results (from the past 240 hour(s))  Clostridium Difficile by PCR     Status: None   Collection Time: 06/11/14  6:49 PM  Result Value Ref Range Status   C difficile by pcr NEGATIVE NEGATIVE Final  Culture, blood (routine x 2)     Status: None (Preliminary result)   Collection Time: 06/19/14  6:16 PM  Result Value Ref Range Status   Specimen Description BLOOD RIGHT ARM  Final   Special Requests BOTTLES DRAWN AEROBIC AND ANAEROBIC 5CC  Final   Culture   Final           BLOOD CULTURE RECEIVED NO GROWTH TO DATE CULTURE WILL BE HELD FOR 5 DAYS BEFORE ISSUING A FINAL NEGATIVE  REPORT Performed at [facility]    Report Status PENDING  Incomplete  Culture, blood (routine x 2)     Status: None (Preliminary result)   Collection Time: 06/19/14  6:23 PM  Result Value Ref Range Status   Specimen Description BLOOD LEFT ARM  Final   Special Requests BOTTLES DRAWN AEROBIC AND ANAEROBIC 5CC  Final   Culture   Final           BLOOD CULTURE RECEIVED NO GROWTH  TO DATE CULTURE WILL BE HELD FOR 5 DAYS BEFORE ISSUING A FINAL NEGATIVE REPORT Performed at [facility]    Report Status PENDING  Incomplete     Labs: Basic Metabolic Panel:  Recent Labs Lab 06/19/14 1816 06/20/14 0512  NA 137 139  K 3.9 3.9  CL 105 110  CO2 27 25  GLUCOSE 85 85  BUN 11 9  CREATININE 1.01 0.94  CALCIUM 8.6 8.4   Liver Function Tests:  Recent Labs Lab 06/19/14 1816  AST 23  ALT 26  ALKPHOS 81  BILITOT 0.2*  PROT 6.1  ALBUMIN 2.7*   No results for input(s): LIPASE, AMYLASE in the last 168 hours. No results for input(s): AMMONIA in the last 168 hours. CBC:  Recent Labs Lab 06/19/14 1816 06/20/14 0512  WBC 10.5 7.9  NEUTROABS 7.0  --   HGB 10.8* 10.0*  HCT 32.0* 27.8*  MCV 99.1 100.7*  PLT 295 273   Cardiac Enzymes: No results for input(s): CKTOTAL, CKMB, CKMBINDEX, TROPONINI in the last 168 hours. BNP: BNP (last 3 results) No results for input(s): BNP in the last 8760 hours.  ProBNP (last 3 results) No results for input(s): PROBNP in the last 8760 hours.  CBG: No results for input(s): GLUCAP in the last 168 hours.     SignedChristiane Ha  [facility] 06/21/2014, 3:57 PM

## 2014-06-21 NOTE — Telephone Encounter (Signed)
Apparently they already  to have a specimen at [facility] so will run  sensitivities there also 1 see if we can send anything to [facility] as well I discussed with lab

## 2014-06-21 NOTE — Telephone Encounter (Signed)
Dr. Ninetta Lights has already ordered sensitivities through [facility].  Dr. Daiva Eves do you also want sensitivities through [facility]?  Please call [facility] at [phone], Option #3 with your answer.

## 2014-06-22 ENCOUNTER — Ambulatory Visit (HOSPITAL_BASED_OUTPATIENT_CLINIC_OR_DEPARTMENT_OTHER)
Admission: AD | Admit: 2014-06-22 | Discharge: 2014-06-22 | Disposition: A | Discharge status: Home / Self Care | Payer: Managed Care, Other (non HMO) | Source: Ambulatory Visit | Attending: Orthopedic Surgery | Admitting: Orthopedic Surgery

## 2014-06-22 ENCOUNTER — Ambulatory Visit (HOSPITAL_BASED_OUTPATIENT_CLINIC_OR_DEPARTMENT_OTHER): Payer: Managed Care, Other (non HMO) | Admitting: Anesthesiology

## 2014-06-22 ENCOUNTER — Encounter (HOSPITAL_BASED_OUTPATIENT_CLINIC_OR_DEPARTMENT_OTHER)
Admission: AD | Disposition: A | Discharge status: Home / Self Care | Payer: Self-pay | Source: Ambulatory Visit | Attending: Orthopedic Surgery

## 2014-06-22 ENCOUNTER — Ambulatory Visit (HOSPITAL_BASED_OUTPATIENT_CLINIC_OR_DEPARTMENT_OTHER)
Admission: RE | Admit: 2014-06-22 | Payer: Managed Care, Other (non HMO) | Source: Ambulatory Visit | Admitting: Orthopedic Surgery

## 2014-06-22 ENCOUNTER — Encounter (HOSPITAL_BASED_OUTPATIENT_CLINIC_OR_DEPARTMENT_OTHER): Payer: Self-pay | Admitting: Orthopedic Surgery

## 2014-06-22 ENCOUNTER — Other Ambulatory Visit: Payer: Self-pay | Admitting: Orthopedic Surgery

## 2014-06-22 DIAGNOSIS — Z91041 Radiographic dye allergy status: Secondary | ICD-10-CM | POA: Insufficient documentation

## 2014-06-22 DIAGNOSIS — Z79899 Other long term (current) drug therapy: Secondary | ICD-10-CM | POA: Insufficient documentation

## 2014-06-22 DIAGNOSIS — Z87891 Personal history of nicotine dependence: Secondary | ICD-10-CM | POA: Insufficient documentation

## 2014-06-22 DIAGNOSIS — F419 Anxiety disorder, unspecified: Secondary | ICD-10-CM | POA: Insufficient documentation

## 2014-06-22 DIAGNOSIS — M009 Pyogenic arthritis, unspecified: Secondary | ICD-10-CM | POA: Insufficient documentation

## 2014-06-22 DIAGNOSIS — Z885 Allergy status to narcotic agent status: Secondary | ICD-10-CM | POA: Insufficient documentation

## 2014-06-22 DIAGNOSIS — Z91013 Allergy to seafood: Secondary | ICD-10-CM | POA: Insufficient documentation

## 2014-06-22 DIAGNOSIS — K5090 Crohn's disease, unspecified, without complications: Secondary | ICD-10-CM | POA: Insufficient documentation

## 2014-06-22 DIAGNOSIS — A319 Mycobacterial infection, unspecified: Secondary | ICD-10-CM | POA: Diagnosis not present

## 2014-06-22 DIAGNOSIS — Z87442 Personal history of urinary calculi: Secondary | ICD-10-CM | POA: Insufficient documentation

## 2014-06-22 DIAGNOSIS — Z7982 Long term (current) use of aspirin: Secondary | ICD-10-CM | POA: Insufficient documentation

## 2014-06-22 DIAGNOSIS — K219 Gastro-esophageal reflux disease without esophagitis: Secondary | ICD-10-CM | POA: Insufficient documentation

## 2014-06-22 HISTORY — PX: INCISION AND DRAINAGE ABSCESS: SHX5864

## 2014-06-22 LAB — HIV ANTIBODY (ROUTINE TESTING W REFLEX): HIV Screen 4th Generation wRfx: NONREACTIVE

## 2014-06-22 LAB — HEPATITIS C ANTIBODY (REFLEX): HCV Ab: NEGATIVE

## 2014-06-22 SURGERY — INCISION AND DRAINAGE, ABSCESS
Anesthesia: General | Site: Hand | Laterality: Left

## 2014-06-22 MED ORDER — HYDROMORPHONE HCL 2 MG PO TABS: 1.0000 mg | ORAL_TABLET | ORAL | Status: DC | PRN

## 2014-06-22 MED ORDER — FENTANYL CITRATE 0.05 MG/ML IJ SOLN
INTRAMUSCULAR | Status: AC
  Filled 2014-06-22: qty 6

## 2014-06-22 MED ORDER — MIDAZOLAM HCL 2 MG/2ML IJ SOLN
INTRAMUSCULAR | Status: AC
  Filled 2014-06-22: qty 2

## 2014-06-22 MED ORDER — ONDANSETRON HCL 4 MG/2ML IJ SOLN
INTRAMUSCULAR | Status: DC | PRN
  Administered 2014-06-22: 4 mg via INTRAVENOUS

## 2014-06-22 MED ORDER — PROMETHAZINE HCL 25 MG/ML IJ SOLN: 6.2500 mg | INTRAMUSCULAR | Status: DC | PRN

## 2014-06-22 MED ORDER — HYDROMORPHONE HCL 1 MG/ML IJ SOLN
INTRAMUSCULAR | Status: AC
  Filled 2014-06-22: qty 1

## 2014-06-22 MED ORDER — BUPIVACAINE HCL (PF) 0.25 % IJ SOLN
INTRAMUSCULAR | Status: AC
  Filled 2014-06-22: qty 30

## 2014-06-22 MED ORDER — OXYCODONE HCL 5 MG PO TABS
5.0000 mg | ORAL_TABLET | Freq: Once | ORAL | Status: DC | PRN
Start: 2014-06-22 — End: 2014-06-22

## 2014-06-22 MED ORDER — SODIUM CHLORIDE 0.9 % IR SOLN
Status: DC | PRN
  Administered 2014-06-22: 600 mL

## 2014-06-22 MED ORDER — FENTANYL CITRATE 0.05 MG/ML IJ SOLN: 25.0000 ug | INTRAMUSCULAR | Status: DC | PRN

## 2014-06-22 MED ORDER — HYDROCODONE-ACETAMINOPHEN 5-325 MG PO TABS
2.0000 | ORAL_TABLET | Freq: Once | ORAL | Status: AC
Start: 2014-06-22 — End: 2014-06-22
  Administered 2014-06-22: 2 via ORAL

## 2014-06-22 MED ORDER — MIDAZOLAM HCL 2 MG/2ML IJ SOLN: 1.0000 mg | INTRAMUSCULAR | Status: DC | PRN

## 2014-06-22 MED ORDER — OXYCODONE HCL 5 MG/5ML PO SOLN: 5.0000 mg | Freq: Once | ORAL | Status: DC | PRN

## 2014-06-22 MED ORDER — ONDANSETRON HCL 4 MG PO TABS: 4.0000 mg | ORAL_TABLET | Freq: Four times a day (QID) | ORAL | Status: DC | PRN

## 2014-06-22 MED ORDER — LACTATED RINGERS IV SOLN
INTRAVENOUS | Status: DC
  Administered 2014-06-22 (×2): via INTRAVENOUS

## 2014-06-22 MED ORDER — PROPOFOL 10 MG/ML IV BOLUS
INTRAVENOUS | Status: DC | PRN
  Administered 2014-06-22: 100 mg via INTRAVENOUS

## 2014-06-22 MED ORDER — FENTANYL CITRATE 0.05 MG/ML IJ SOLN: 50.0000 ug | INTRAMUSCULAR | Status: DC | PRN

## 2014-06-22 MED ORDER — CHLORHEXIDINE GLUCONATE 4 % EX LIQD: 60.0000 mL | Freq: Once | CUTANEOUS | Status: DC

## 2014-06-22 MED ORDER — DEXAMETHASONE SODIUM PHOSPHATE 10 MG/ML IJ SOLN
INTRAMUSCULAR | Status: DC | PRN
  Administered 2014-06-22: 4 mg via INTRAVENOUS

## 2014-06-22 MED ORDER — HEPARIN SOD (PORK) LOCK FLUSH 100 UNIT/ML IV SOLN: 250.0000 [IU] | INTRAVENOUS | Status: DC | PRN

## 2014-06-22 MED ORDER — LIDOCAINE HCL (CARDIAC) 20 MG/ML IV SOLN
INTRAVENOUS | Status: DC | PRN
  Administered 2014-06-22: 30 mg via INTRAVENOUS

## 2014-06-22 MED ORDER — KETOROLAC TROMETHAMINE 30 MG/ML IJ SOLN: 30.0000 mg | Freq: Once | INTRAMUSCULAR | Status: DC | PRN

## 2014-06-22 MED ORDER — MIDAZOLAM HCL 5 MG/5ML IJ SOLN
INTRAMUSCULAR | Status: DC | PRN
  Administered 2014-06-22: 1 mg via INTRAVENOUS

## 2014-06-22 MED ORDER — HYDROMORPHONE HCL 1 MG/ML IJ SOLN
0.2500 mg | INTRAMUSCULAR | Status: DC | PRN
  Administered 2014-06-22 (×2): 0.5 mg via INTRAVENOUS

## 2014-06-22 MED ORDER — FENTANYL CITRATE 0.05 MG/ML IJ SOLN
INTRAMUSCULAR | Status: DC | PRN
  Administered 2014-06-22 (×2): 50 ug via INTRAVENOUS
  Administered 2014-06-22 (×8): 25 ug via INTRAVENOUS

## 2014-06-22 SURGICAL SUPPLY — 60 items
BAG DECANTER FOR FLEXI CONT (MISCELLANEOUS) IMPLANT
BLADE MINI RND TIP GREEN BEAV (BLADE) ×2 IMPLANT
BLADE SURG 15 STRL LF DISP TIS (BLADE) ×2 IMPLANT
BLADE SURG 15 STRL SS (BLADE) ×6
BNDG CMPR 9X4 STRL LF SNTH (GAUZE/BANDAGES/DRESSINGS) ×1
BNDG COHESIVE 1X5 TAN STRL LF (GAUZE/BANDAGES/DRESSINGS) IMPLANT
BNDG COHESIVE 3X5 TAN STRL LF (GAUZE/BANDAGES/DRESSINGS) ×2 IMPLANT
BNDG ESMARK 4X9 LF (GAUZE/BANDAGES/DRESSINGS) ×3 IMPLANT
BNDG GAUZE ELAST 4 BULKY (GAUZE/BANDAGES/DRESSINGS) ×2 IMPLANT
CHLORAPREP W/TINT 26ML (MISCELLANEOUS) ×3 IMPLANT
CORDS BIPOLAR (ELECTRODE) ×3 IMPLANT
COVER BACK TABLE 60X90IN (DRAPES) ×3 IMPLANT
COVER MAYO STAND STRL (DRAPES) ×3 IMPLANT
CUFF TOURNIQUET SINGLE 18IN (TOURNIQUET CUFF) ×3 IMPLANT
DRAIN TLS ROUND 10FR (DRAIN) ×2 IMPLANT
DRAPE EXTREMITY T 121X128X90 (DRAPE) ×3 IMPLANT
DRAPE OEC MINIVIEW 54X84 (DRAPES) ×2 IMPLANT
DRAPE SURG 17X23 STRL (DRAPES) ×3 IMPLANT
DRSG KUZMA FLUFF (GAUZE/BANDAGES/DRESSINGS) ×1 IMPLANT
GAUZE PACKING IODOFORM 1/4X15 (GAUZE/BANDAGES/DRESSINGS) IMPLANT
GAUZE SPONGE 4X4 12PLY STRL (GAUZE/BANDAGES/DRESSINGS) ×3 IMPLANT
GAUZE XEROFORM 1X8 LF (GAUZE/BANDAGES/DRESSINGS) ×3 IMPLANT
GLOVE BIO SURGEON STRL SZ7.5 (GLOVE) ×4 IMPLANT
GLOVE BIOGEL PI IND STRL 7.0 (GLOVE) ×1 IMPLANT
GLOVE BIOGEL PI IND STRL 8 (GLOVE) IMPLANT
GLOVE BIOGEL PI IND STRL 8.5 (GLOVE) ×1 IMPLANT
GLOVE BIOGEL PI INDICATOR 7.0 (GLOVE) ×2
GLOVE BIOGEL PI INDICATOR 8 (GLOVE) ×2
GLOVE BIOGEL PI INDICATOR 8.5 (GLOVE) ×2
GLOVE ECLIPSE 6.5 STRL STRAW (GLOVE) ×4 IMPLANT
GLOVE EXAM NITRILE EXT CUFF MD (GLOVE) ×2 IMPLANT
GLOVE SURG ORTHO 8.0 STRL STRW (GLOVE) ×3 IMPLANT
GOWN STRL REUS W/ TWL LRG LVL3 (GOWN DISPOSABLE) ×1 IMPLANT
GOWN STRL REUS W/ TWL XL LVL3 (GOWN DISPOSABLE) IMPLANT
GOWN STRL REUS W/TWL LRG LVL3 (GOWN DISPOSABLE) ×3
GOWN STRL REUS W/TWL XL LVL3 (GOWN DISPOSABLE) ×6 IMPLANT
LOOP VESSEL MAXI BLUE (MISCELLANEOUS) IMPLANT
NDL PRECISIONGLIDE 27X1.5 (NEEDLE) IMPLANT
NEEDLE PRECISIONGLIDE 27X1.5 (NEEDLE) IMPLANT
NS IRRIG 1000ML POUR BTL (IV SOLUTION) ×3 IMPLANT
PACK BASIN DAY SURGERY FS (CUSTOM PROCEDURE TRAY) ×3 IMPLANT
PAD CAST 3X4 CTTN HI CHSV (CAST SUPPLIES) ×1 IMPLANT
PADDING CAST ABS 4INX4YD NS (CAST SUPPLIES)
PADDING CAST ABS COTTON 4X4 ST (CAST SUPPLIES) ×1 IMPLANT
PADDING CAST COTTON 3X4 STRL (CAST SUPPLIES) ×3
SPLINT PLASTER CAST XFAST 3X15 (CAST SUPPLIES) IMPLANT
SPLINT PLASTER XTRA FASTSET 3X (CAST SUPPLIES) ×40
STOCKINETTE 4X48 STRL (DRAPES) ×3 IMPLANT
SUT ETHILON 4 0 PS 2 18 (SUTURE) ×6 IMPLANT
SUT VICRYL 4-0 PS2 18IN ABS (SUTURE) ×2 IMPLANT
SWAB COLLECTION DEVICE MRSA (MISCELLANEOUS) ×2 IMPLANT
SYR BULB 3OZ (MISCELLANEOUS) ×3 IMPLANT
SYR CONTROL 10ML LL (SYRINGE) IMPLANT
TOWEL OR 17X24 6PK STRL BLUE (TOWEL DISPOSABLE) ×3 IMPLANT
TUBE ANAEROBIC SPECIMEN COL (MISCELLANEOUS) ×2 IMPLANT
TUBE CONNECTING 20'X1/4 (TUBING) ×1
TUBE CONNECTING 20X1/4 (TUBING) ×1 IMPLANT
TUBE FEEDING 5FR 15 INCH (TUBING) IMPLANT
UNDERPAD 30X30 INCONTINENT (UNDERPADS AND DIAPERS) ×3 IMPLANT
YANKAUER SUCT BULB TIP NO VENT (SUCTIONS) ×3 IMPLANT

## 2014-06-22 NOTE — Op Note (Signed)
Dictation Number [id number]

## 2014-06-22 NOTE — Anesthesia Procedure Notes (Signed)
Procedure Name: LMA Insertion Date/Time: 06/22/2014 1:00 PM Performed by: Melecio Cueto Pre-anesthesia Checklist: Patient identified, Emergency Drugs available, Suction available and Patient being monitored Patient Re-evaluated:Patient Re-evaluated prior to inductionOxygen Delivery Method: Circle System Utilized Preoxygenation: Pre-oxygenation with 100% oxygen Intubation Type: IV induction Ventilation: Mask ventilation without difficulty LMA: LMA inserted LMA Size: 4.0 Number of attempts: 1 Airway Equipment and Method: Bite block Placement Confirmation: positive ETCO2 Tube secured with: Tape Dental Injury: Teeth and Oropharynx as per pre-operative assessment

## 2014-06-22 NOTE — Discharge Instructions (Signed)

## 2014-06-22 NOTE — Transfer of Care (Signed)
Immediate Anesthesia Transfer of Care Note  Patient: Lee Klein  Procedure(s) Performed: Procedure(s): INCISION, DEBRIDEMENT AND IRRIGATION LEFT HAND/WRIST (Left)  Patient Location: PACU  Anesthesia Type:General  Level of Consciousness: awake, alert , oriented and patient cooperative  Airway & Oxygen Therapy: Patient Spontanous Breathing and Patient connected to face mask oxygen  Post-op Assessment: Report given to RN and Post -op Vital signs reviewed and stable  Post vital signs: Reviewed and stable  Last Vitals:  Filed Vitals:   06/22/14 1216  BP: 143/85  Pulse: 122  Temp: 36.5 C  Resp: 16    Complications: No apparent anesthesia complications

## 2014-06-22 NOTE — Anesthesia Preprocedure Evaluation (Addendum)
Anesthesia Evaluation  Patient identified by MRN, date of birth, ID band Patient awake    Reviewed: Allergy & Precautions, NPO status , Patient's Chart, lab work & pertinent test results  Airway Mallampati: II  TM Distance: >3 FB Neck ROM: Full    Dental no notable dental hx.    Pulmonary neg pulmonary ROS, former smoker,  breath sounds clear to auscultation  Pulmonary exam normal       Cardiovascular negative cardio ROS  Rhythm:Regular Rate:Normal     Neuro/Psych negative neurological ROS  negative psych ROS   GI/Hepatic Neg liver ROS, GERD-  ,chrohns dz   Endo/Other  negative endocrine ROS  Renal/GU negative Renal ROS  negative genitourinary   Musculoskeletal negative musculoskeletal ROS (+)   Abdominal   Peds negative pediatric ROS (+)  Hematology  (+) anemia ,   Anesthesia Other Findings   Reproductive/Obstetrics negative OB ROS                            Anesthesia Physical Anesthesia Plan  ASA: II  Anesthesia Plan: General   Post-op Pain Management:    Induction: Intravenous  Airway Management Planned: LMA  Additional Equipment:   Intra-op Plan:   Post-operative Plan: Extubation in OR  Informed Consent: I have reviewed the patients History and Physical, chart, labs and discussed the procedure including the risks, benefits and alternatives for the proposed anesthesia with the patient or authorized representative who has indicated his/her understanding and acceptance.   Dental advisory given  Plan Discussed with: CRNA and Surgeon  Anesthesia Plan Comments:         Anesthesia Quick Evaluation

## 2014-06-22 NOTE — H&P (Signed)
Lee Klein is a 64 year-old right-hand dominant male referred by Dr. [name] for consultation with respect to swelling of his left wrist dorsal aspect, radial side. This has been going on since May.  He has seen multiple physicians. He has had this injected on two occasions. He was given antibiotics when this first began after bowel surgery.  He states that it went down, but returned in July after using it. He recalls no history of injury prior to that time.  He is on immunosuppression for Crohn's disease.  Dr. [name] was concerned about the possibility of infection vs. inflammatory prior to starting DMARDs.  He has had MRI done revealing scapholunate ligament injury, poor visualization of the ulnar attachment of the triangular fibrocartilage complex and erosive arthropathy with synovitis.  Etiology is atypical mycobacterium.  He is on methylprednisolone.  He has had an arthroscopy of his wrist to obtain culture  Providing the diagnosis. He has been unable to tolerate rifampin and has progression of infection on recent MRI. He complains of a constant, moderate aching pain.  He is concerned about its progression.  He has no history of diabetes, thyroid problems, arthritis or gout.   ALLERGIES:     Demerol and IVP dye.  MEDICATIONS:    Imuran, anti-diarrheal tabs, Protonix, vitamin C and calcium.  SURGICAL HISTORY:    Colon surgeries (x7), hernia repair.  FAMILY MEDICAL HISTORY:     Positive for arthritis.  SOCIAL HISTORY:     He does not smoke or drink.  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  REVIEW OF SYSTEMS:   Positive for Crohn's disease, glasses, otherwise negative 14 points. Lee Klein is an 64 y.o. male.   Chief Complaint: atypical mycobacterial infection left wrist with osteomyelitis HPI: see above  Past Medical History  Diagnosis Date  . Crohn's disease   . Hernia, abdominal     "repaired 10/2013"  . GERD (gastroesophageal reflux disease)   . Anxiety   .  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 Kidney stones     "passed them"    Past Surgical History  Procedure Laterality Date  . Ileostomy  06/2010; 10/2013  . Colonoscopy    . Upper gastrointestinal endoscopy    . Appendectomy  ~ 2009  . Ileostomy closure N/A 10/23/2013    Procedure: ILEOSTOMY TAKEDOWN, REPAIR OF PARASTOMAL HERNIA.;  Surgeon: Cherylynn Ridges, MD;  Location: [facility] OR;  Service: General;  Laterality: N/A;  . Wrist arthroscopy with debridement Left 05/24/2014    Procedure: LEFT ARTHROSCOPY WRIST CULTURE/BIOPSY DEBRIDEMENT;  Surgeon: Cindee Salt, MD;  Location: [facility];  Service: Orthopedics;  Laterality: Left;  . Wrist fracture surgery Right 06/2006    [name] 09/29/2010  . Esophagogastroduodenoscopy N/A 06/15/2014    Procedure: ESOPHAGOGASTRODUODENOSCOPY (EGD);  Surgeon: Malissa Hippo, MD;  Location: [facility];  Service: Endoscopy;  Laterality: N/A;  . Cholecystectomy  ~ 2008  . Tonsillectomy  1963  . Fracture surgery    . Hernia repair  10/2013    parastomal/notes 10/03/2013  . Colon surgery  ~ 2010    for a colonic stricture at the anastomosis from a colostomy reversal/notes 10/03/2013  . Parastomal hernia repair  10/2013    Family History  Problem Relation Age of Onset  . Healthy Mother   . Healthy Daughter   . Healthy Son    Social History:  reports that he quit smoking  about 8 years ago. His smoking use included Cigars. He has never used smokeless tobacco. He reports that he does not drink alcohol or use illicit drugs.  Allergies:  Allergies  Allergen Reactions  . Ivp Dye [Iodinated Diagnostic Agents]     Joint pain, nausea  . Fish Allergy Nausea And Vomiting  . Rifampin Nausea And Vomiting and Other (See Comments)    Chills  . Shellfish Allergy Swelling  . Demerol Rash    Medications Prior to Admission  Medication Sig Dispense Refill  . acetaminophen (TYLENOL) 325 MG tablet Take 650 mg by mouth every 6 (six) hours as  needed (pain).    Marland Kitchen ALPRAZolam (XANAX) 0.5 MG tablet Take 1 tablet (0.5 mg total) by mouth 2 (two) times daily as needed for anxiety. (Patient taking differently: Take 0.5 mg by mouth daily as needed for anxiety. ) 60 tablet 2  . Ascorbic Acid (VITAMIN C) 1000 MG tablet Take 1,000 mg by mouth daily.    Marland Kitchen aspirin EC 81 MG tablet Take 1 tablet (81 mg total) by mouth daily.    . Calcium Carbonate-Vitamin D (CALCIUM 600 + D PO) Take 1 tablet by mouth daily.     . cefOXitin 12 g in sodium chloride 0.9 % 250 mL AT LEAST THROUGH 07/20/14, LIKELY LONGER, DO NOT STOP WITHOUT DISCUSSING WITH DR. VANDAMM 31 each 0  . clarithromycin (BIAXIN) 500 MG tablet Take 1 tablet (500 mg total) by mouth every 12 (twelve) hours. 60 tablet 1  . feeding supplement, ENSURE COMPLETE, (ENSURE COMPLETE) LIQD Take 237 mLs by mouth 2 (two) times daily between meals.    . fluticasone (FLONASE) 50 MCG/ACT nasal spray Place 2 sprays into both nostrils daily as needed for allergies or rhinitis.   2  . linezolid (ZYVOX) 600 MG tablet Take 1 tablet (600 mg total) by mouth every 12 (twelve) hours. 60 tablet 1  . loperamide (IMODIUM A-D) 2 MG tablet Take 8 mg by mouth 2 (two) times daily.     . Multiple Vitamins-Minerals (MULTIVITAMIN PO) Take 1 tablet by mouth daily.     . ondansetron (ZOFRAN) 4 MG tablet Take 1 tablet (4 mg total) by mouth 2 (two) times daily as needed for nausea or vomiting. 30 tablet 0  . pantoprazole (PROTONIX) 40 MG tablet Take 1 tablet (40 mg total) by mouth 2 (two) times daily before a meal. 60 tablet 2  . Zinc 50 MG CAPS Take 1 capsule by mouth daily.     Marland Kitchen HYDROcodone-acetaminophen (NORCO/VICODIN) 5-325 MG per tablet Take 1-2 tablets by mouth 2 (two) times daily as needed for moderate pain. (Patient taking differently: Take 0.5 tablets by mouth at bedtime as needed for moderate pain. ) 60 tablet 0  . nystatin (MYCOSTATIN/NYSTOP) 100000 UNIT/GM POWD Apply topically See admin instructions. PRN  1    Results for  orders placed or performed during the hospital encounter of 06/19/14 (from the past 48 hour(s))  Sedimentation rate     Status: Abnormal   Collection Time: 06/21/14  5:57 AM  Result Value Ref Range   Sed Rate 89 (H) 0 - 16 mm/hr  C-reactive protein     Status: Abnormal   Collection Time: 06/21/14  5:57 AM  Result Value Ref Range   CRP 5.9 (H) <0.60 mg/dL    Comment: Performed at [facility]    No results found.   Pertinent items are noted in HPI.  Blood pressure 143/85, pulse 122, temperature 97.7 F (36.5 C),  temperature source Oral, resp. rate 16, height  (1.778 m), weight 77.111 kg (170 lb), SpO2 97 %.  General appearance: alert, cooperative and appears stated age Head: Normocephalic, without obvious abnormality Neck: no JVD Resp: clear to auscultation bilaterally Cardio: regular rate and rhythm, S1, S2 normal, no murmur, click, rub or gallop GI: soft, non-tender; bowel sounds normal; no masses,  no organomegaly Extremities: swelling left wrist Pulses: 2+ and symmetric Skin: Skin color, texture, turgor normal. No rashes or lesions Neurologic: Grossly normal Incision/Wound: na  Assessment/Plan Plan I ad D to decrease the colony count as much as possible, due to difficulty with antibiotic tolerance.  Shigeko Manard R 06/22/2014, 12:18 PM

## 2014-06-22 NOTE — Progress Notes (Signed)
Discharge home. Home discharge instruction given, no questions verbalized. Home health needs arranged by the case manager.

## 2014-06-22 NOTE — Anesthesia Postprocedure Evaluation (Signed)
Anesthesia Post Note  Patient: Lee Klein  Procedure(s) Performed: Procedure(s) (LRB): INCISION, DEBRIDEMENT AND IRRIGATION LEFT HAND/WRIST (Left)  Anesthesia type: General  Patient location: PACU  Post pain: Pain level controlled and Adequate analgesia  Post assessment: Post-op Vital signs reviewed, Patient's Cardiovascular Status Stable, Respiratory Function Stable, Patent Airway and Pain level controlled  Last Vitals:  Filed Vitals:   06/22/14 1530  BP:   Pulse: 108  Temp:   Resp: 12    Post vital signs: Reviewed and stable  Level of consciousness: awake, alert  and oriented  Complications: No apparent anesthesia complications

## 2014-06-22 NOTE — Brief Op Note (Signed)
06/22/2014  2:49 PM  PATIENT:  Dereck Leep  64 y.o. male  PRE-OPERATIVE DIAGNOSIS:  CHRONIC INFECTION LEFT HAND/WRIST  POST-OPERATIVE DIAGNOSIS:  CHRONIC INFECTION LEFT HAND/WRIST  PROCEDURE:  Procedure(s): INCISION, DEBRIDEMENT AND IRRIGATION LEFT HAND/WRIST (Left)  SURGEON:  Surgeon(s) and Role:    * Cindee Salt, MD - Primary    * Betha Loa, MD - Assisting  PHYSICIAN ASSISTANT:   ASSISTANTS: K Jobe Mutch,MD   ANESTHESIA:   general  EBL:  Total I/O In: 1000 [I.V.:1000] Out: -   BLOOD ADMINISTERED:none  DRAINS: (left wrist) TLS Drain(s) to suction in the wrist   LOCAL MEDICATIONS USED:  NONE  SPECIMEN:  Excision  DISPOSITION OF SPECIMEN:  PATHOLOGY  COUNTS:  YES  TOURNIQUET:   Total Tourniquet Time Documented: Upper Arm (Left) - 81 minutes Total: Upper Arm (Left) - 81 minutes   DICTATION: .Other Dictation: Dictation Number [id number]  PLAN OF CARE: Discharge to home after PACU  PATIENT DISPOSITION:  PACU - hemodynamically stable.

## 2014-06-22 NOTE — Progress Notes (Signed)
[facility]    Subjective: Swelling in hand is going down   Antibiotics:  Anti-infectives    None      Medications: Scheduled Meds: . chlorhexidine  60 mL Topical Once   Continuous Infusions: . lactated ringers     PRN Meds:.fentaNYL, fentaNYL, HYDROmorphone (DILAUDID) injection, ketorolac, midazolam, oxyCODONE **OR** oxyCODONE, promethazine, promethazine    Objective: Weight change:   Intake/Output Summary (Last 24 hours) at 06/22/14 1718 Last data filed at 06/22/14 1708  Gross per 24 hour  Intake   1644 ml  Output      0 ml  Net   1644 ml   Blood pressure 150/78, pulse 102, temperature 97.7 F (36.5 C), temperature source Oral, resp. rate 18, height  (1.778 m), weight 170 lb (77.111 kg), SpO2 96 %. Temp:  [97.6 F (36.4 C)-98.6 F (37 C)] 97.7 F (36.5 C) (02/05 1708) Pulse Rate:  [81-122] 102 (02/05 1708) Resp:  [9-22] 18 (02/05 1708) BP: (116-155)/(61-91) 150/78 mmHg (02/05 1708) SpO2:  [94 %-98 %] 96 % (02/05 1708) Weight:  [170 lb (77.111 kg)] 170 lb (77.111 kg) (02/05 1216)  Physical Exam: General: Alert and awake, oriented x3, not in any acute distress. HEENT: EOMI, oropharynx clear and without exudate CVS regular rate, normal r, no murmur rubs or gallops Chest: clear to auscultation bilaterally, no wheezing, rales or rhonchi Abdomen: soft nontender, nondistended, normal bowel sounds, Extremities: edema of left hand with tenderness, warmth Neuro: nonfocal  CBC:  CBC Latest Ref Rng 06/20/2014 06/19/2014 06/13/2014  WBC 4.0 - 10.5 K/uL 7.9 10.5 4.8  Hemoglobin 13.0 - 17.0 g/dL 10.0(L) 10.8(L) 11.8(L)  Hematocrit 39.0 - 52.0 % 27.8(L) 32.0(L) 35.0(L)  Platelets 150 - 400 K/uL 273 295 220       BMET  Recent Labs  06/19/14 1816 06/20/14 0512  NA 137 139  K 3.9 3.9  CL 105 110  CO2 27 25  GLUCOSE 85 85  BUN 11 9  CREATININE 1.01 0.94  CALCIUM 8.6 8.4     Liver Panel   Recent Labs   06/19/14 1816  PROT 6.1  ALBUMIN 2.7*  AST 23  ALT 26  ALKPHOS 81  BILITOT 0.2*       Sedimentation Rate  Recent Labs  06/21/14 0557  ESRSEDRATE 89*   C-Reactive Protein  Recent Labs  06/21/14 0557  CRP 5.9*    Micro Results: Recent Results (from the past 720 hour(s))  Anaerobic culture     Status: None   Collection Time: 05/24/14  2:37 PM  Result Value Ref Range Status   Specimen Description FLUID WRIST SYNOVIAL LEFT  Final   Special Requests NONE  Final   Gram Stain   Final    FEW WBC PRESENT,BOTH PMN AND MONONUCLEAR NO ORGANISMS SEEN Performed at [facility]    Culture   Final    NO ANAEROBES ISOLATED Performed at [facility]    Report Status 05/29/2014 FINAL  Final  Fungus Culture with Smear     Status: None   Collection Time: 05/24/14  2:37 PM  Result Value Ref Range Status   Specimen Description FLUID WRIST SYNOVIAL LEFT  Final   Special Requests NONE  Final   Fungal Smear   Final    NO YEAST OR FUNGAL ELEMENTS SEEN Performed at [facility]    Culture   Final    No Fungi Isolated in 4 Weeks Performed at [facility]  Report Status 06/21/2014 FINAL  Final  Body fluid culture     Status: None   Collection Time: 05/24/14  2:37 PM  Result Value Ref Range Status   Specimen Description FLUID WRIST SYNOVIAL LEFT  Final   Special Requests NONE  Final   Gram Stain   Final    FEW WBC PRESENT,BOTH PMN AND MONONUCLEAR NO ORGANISMS SEEN Performed at [facility]    Culture   Final    FEW MYCOBACTERIUM ABSCESSUS Note: REFER TO PREVIOUS CULTURE [id number] FOR CALLS Performed at [facility]    Report Status 06/21/2014 FINAL  Final  AFB culture with smear     Status: None (Preliminary result)   Collection Time: 05/24/14  2:37 PM  Result Value Ref Range Status   Specimen Description FLUID WRIST SYNOVIAL LEFT  Final   Special Requests NONE  Final   Acid Fast Smear   Final    1681 1+ ACID FAST  BACILLI SEEN CRITICAL RESULT CALLED TO, READ BACK BY AND VERIFIED WITH: ALICIA 15:55 05/25/14 FUENG FAXED TO 544 Performed at [facility]    Culture   Final    MYCOBACTERIUM ABSCESSUS Note: CRITICAL RESULT CALLED TO, READ BACK BY AND VERIFIED WITH: DR.HATCHER @ 1631 ON 06/19/14 BY WILSN SUSCEPTIBILITY TO FOLLOW Performed at [facility]    Report Status PENDING  Incomplete  Urine culture     Status: None   Collection Time: 06/11/14  2:56 PM  Result Value Ref Range Status   Specimen Description URINE, CLEAN CATCH  Final   Special Requests NONE  Final   Colony Count NO GROWTH Performed at [facility]   Final   Culture NO GROWTH Performed at [facility]   Final   Report Status 06/12/2014 FINAL  Final  Clostridium Difficile by PCR     Status: None   Collection Time: 06/11/14  6:49 PM  Result Value Ref Range Status   C difficile by pcr NEGATIVE NEGATIVE Final  Culture, blood (routine x 2)     Status: None (Preliminary result)   Collection Time: 06/19/14  6:16 PM  Result Value Ref Range Status   Specimen Description BLOOD RIGHT ARM  Final   Special Requests BOTTLES DRAWN AEROBIC AND ANAEROBIC 5CC  Final   Culture   Final           BLOOD CULTURE RECEIVED NO GROWTH TO DATE CULTURE WILL BE HELD FOR 5 DAYS BEFORE ISSUING A FINAL NEGATIVE REPORT Performed at [facility]    Report Status PENDING  Incomplete  Culture, blood (routine x 2)     Status: None (Preliminary result)   Collection Time: 06/19/14  6:23 PM  Result Value Ref Range Status   Specimen Description BLOOD LEFT ARM  Final   Special Requests BOTTLES DRAWN AEROBIC AND ANAEROBIC 5CC  Final   Culture   Final           BLOOD CULTURE RECEIVED NO GROWTH TO DATE CULTURE WILL BE HELD FOR 5 DAYS BEFORE ISSUING A FINAL NEGATIVE REPORT Performed at [facility]    Report Status PENDING  Incomplete    Studies/Results: No results found.    Assessment/Plan:  Active Problems:   *  No active hospital problems. *    Lee Klein is a 64 y.o. 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 It is highly resistant to antimicrobial therapy.  In order for this patient have a chance at cure he is going to need very aggressive surgical management and at time of my writing this note he has already had this surgery   I have worked with Tennis Must, Pharm D, Celedonio Miyamoto Pharm D and we have come up with continuous infusion of CEFOXITIN 12 grams per day as ideal beta lactam to use for his infection  NOTE for reliable monitoring of renal fxn the pt will need to have his cefoxitin infusion held for 2 hours each week prior to the BMP draw  We will also continue BIAXIN 500mg  BID  And ZYVOX 600mg  BID  I called Micro lab and requested SENSIS be sent to [facility]     LOS: 0 days   Acey Lav 06/22/2014, 5:18 PM

## 2014-06-25 ENCOUNTER — Encounter (HOSPITAL_BASED_OUTPATIENT_CLINIC_OR_DEPARTMENT_OTHER): Payer: Self-pay | Admitting: Orthopedic Surgery

## 2014-06-25 ENCOUNTER — Telehealth (INDEPENDENT_AMBULATORY_CARE_PROVIDER_SITE_OTHER): Payer: Self-pay | Admitting: *Deleted

## 2014-06-25 NOTE — Op Note (Signed)
NAMEPAULO, Lee Klein                ACCOUNT NO.:  [id number]  MEDICAL RECORD NO.:  [id number]  LOCATION:                                 FACILITY:  PHYSICIAN:  Cindee Salt, M.D.       DATE OF BIRTH:  Oct 09, 1950  DATE OF PROCEDURE:  06/22/2014 DATE OF DISCHARGE:                              OPERATIVE REPORT   PREOPERATIVE DIAGNOSIS:  Atypical mycobacterial infection, left hand and left wrist.  POSTOPERATIVE DIAGNOSIS:  Atypical mycobacterial infection, left hand and left wrist.  OPERATION:  Incision and drainage of wrist with thorough debridement including distal radioulnar joint, incision and drainage of palm with drainage of 2 abscesses, one on the index, one on the ring finger.  SURGEON:  Cindee Salt, M.D.  ASSISTANT:  Betha Loa, MD  ANESTHESIA:  General.  ANESTHESIOLOGIST:  Quita Skye. Krista Blue, M.D.  HISTORY:  The patient is a 64 year old male with a history of swelling of his left wrist, this is chronic in nature.  Over the past 8-9 months, he has Crohn disease, was going to be placed on Enbrel.  I was asked to see him to be certain that he did not have an infectious process.  We recommended arthroscopy of his wrist with cultures and synovectomy. This was performed.  Laboratory cultures produced no fungal growth.  No aerobic, no anaerobic growth, but grew out atypical mycobacteria and abscesses.  He was attempted to be placed on rifampin, he was not able to tolerate this, this was removed, his wrist significantly increased in size.  He was admitted for consultation.  MRI revealed osteomyelitis of the radius ulna virtually all the carpal bones and metacarpals with abscess formed in the volar aspect of the index and the volar aspect of the ring finger metacarpals along with gross swelling of the wrist joint.  He is admitted for debulking of the bacterial load with drainage of both abscesses.  He was seen in the preoperative area.  The extremity marked by both the patient and  surgeon.  Questions encouraged and answered to his satisfaction.  PROCEDURE IN DETAIL:  The patient was brought to the operating room, where general anesthetic was carried out without difficulty.  He was prepped using ChloraPrep, supine position with the left arm free.  The limb was exsanguinated from the distal forearm proximally.  A tourniquet on the upper arm inflated to 250 mmHg.  Time-out taken, confirming the patient and procedure.  The palm incision was made from the mid palm to the index metacarpal at the metacarpophalangeal joint proximal crease, carried down through subcutaneous tissue.  This allowed dissection to be carried down to the metacarpal of the ring finger base.  X-rays confirmed positioning and the abscess was opened draining a cloudy fluid.  Dissection was then carried down to the ulnar aspect of the index metacarpal.  Again the abscess was opened and drained of a cloudy fluid.  This wound was left open.  A separate incision was then made on the dorsum of the hand, carried down through subcutaneous tissue. Bleeders electrocauterized or tied with 4-0 Vicryl sutures.  The extensor retinaculum incised at the 3rd and 4th dorsal compartment junction.  The  joint capsule was then opened with longitudinal incision. A large amount of granulation-type tissue was immediately encountered. This was removed with a large synovectomy rongeur taking care to clean as much tissue from both the midcarpal and proximal carpal joints. Significant erosions of each of the bones were noted.  There appeared to be a fracture at the neck of the capitate.  Specimen was sent to Pathology.  Repeat cultures were taken for both aerobic and anaerobic cultures to be certain there was not a superinfection with bacteria rather than mycobacteria.  The wound was then copiously irrigated with saline.  The distal radioulnar joint was opened through the proximal aspect of the wound.  This was debrided with a  synovectomy rongeur. Copious irrigation of each one of the wounds was then undertaken with saline.  Following this, the palmar wound was closed with interrupted 4- 0 nylon sutures.  A TLS drain was then placed and brought out proximally.  This was then placed into the wrist joint, the capsule closed over this along with the retinaculum, this was done with 4-0 Vicryl sutures, subcutaneous tissue with interrupted 4-0 Vicryl, and skin with interrupted 4-0 nylon sutures.  A sterile compressive dorsal palmar splint dressing was applied with the fingers free.  On deflation of the tourniquet, all fingers immediately pinked.  The TLS wound drain was then hooked up to suction draining further fluid.  The patient tolerated the procedure well, was taken to the recovery room for observation in a satisfactory condition.  He will be discharged home to return to the [facility] [place] on Monday with Dilaudid.  He was advised of the importance of elevation, a second suction tube will be given to his wife to attach over the weekend when the first fills.  The drain will be removed on his return on Monday.          ______________________________ Cindee Salt, M.D.     GK/MEDQ  D:  06/22/2014  T:  06/23/2014  Job:  [id number]

## 2014-06-25 NOTE — Telephone Encounter (Signed)
Lee Klein Nurse with [facility] called to make [name] aware that the patient has been taken off Steroids for now. As for how long not sure. She is currently administering IV Antibiotics for Osteo Mylitits Left Wrist. Patient's wife is concerned being off these medications may cause him to have a flare and just wanted to make sure Dr.Rehman is aware.  Dr.Rehman made aware and he is all right with this. Message left for the patient.

## 2014-06-26 LAB — CULTURE, BLOOD (ROUTINE X 2)
CULTURE: NO GROWTH
Culture: NO GROWTH

## 2014-06-26 LAB — TISSUE CULTURE: CULTURE: NO GROWTH

## 2014-06-27 LAB — ANAEROBIC CULTURE

## 2014-07-03 ENCOUNTER — Ambulatory Visit (INDEPENDENT_AMBULATORY_CARE_PROVIDER_SITE_OTHER): Payer: Managed Care, Other (non HMO) | Admitting: Infectious Disease

## 2014-07-03 ENCOUNTER — Encounter: Payer: Self-pay | Admitting: Infectious Disease

## 2014-07-03 VITALS — BP 165/102 | HR 102 | Temp 97.6°F | Ht 70.0 in | Wt 172.0 lb

## 2014-07-03 DIAGNOSIS — K5090 Crohn's disease, unspecified, without complications: Secondary | ICD-10-CM

## 2014-07-03 DIAGNOSIS — M869 Osteomyelitis, unspecified: Secondary | ICD-10-CM

## 2014-07-03 DIAGNOSIS — R627 Adult failure to thrive: Secondary | ICD-10-CM

## 2014-07-03 DIAGNOSIS — M009 Pyogenic arthritis, unspecified: Secondary | ICD-10-CM

## 2014-07-03 DIAGNOSIS — R1314 Dysphagia, pharyngoesophageal phase: Secondary | ICD-10-CM | POA: Insufficient documentation

## 2014-07-03 DIAGNOSIS — A319 Mycobacterial infection, unspecified: Secondary | ICD-10-CM

## 2014-07-03 DIAGNOSIS — R630 Anorexia: Secondary | ICD-10-CM

## 2014-07-03 MED ORDER — AZITHROMYCIN 600 MG PO TABS: 600.0000 mg | ORAL_TABLET | Freq: Every day | ORAL | Status: DC

## 2014-07-03 MED ORDER — FLUCONAZOLE 100 MG PO TABS: 100.0000 mg | ORAL_TABLET | Freq: Every day | ORAL | Status: DC

## 2014-07-03 NOTE — Progress Notes (Signed)
Subjective:    Patient ID: Lee Klein, male    DOB: [birth year], 64 y.o.   MRN: [id number]  HPI  64 y.o. 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 [name] in January of this year.  He was admitted to the hospital and we started patient on IV cefoxitin high dose, BID clarithro and BID zyvox.  He had ncision and drainage of wrist with thorough debridementincluding distal radioulnar joint, incision and drainage of palm withdrainage of 2 abscesses, one on the index, one on the ring finger on February 5th, 2016.  Since he left the hospital he is having a VERY hard time tolerating his abx with metallic taste in mouth, poor appetite nausea and feeling of burning down his throat.  Review of Systems  Constitutional: Positive for activity change, appetite change and fatigue. Negative for fever, chills and diaphoresis.  HENT: Positive for trouble swallowing. Negative for congestion, rhinorrhea, sinus pressure, sneezing and sore throat.   Eyes: Negative for photophobia and visual disturbance.  Respiratory: Negative for cough, chest tightness, shortness of breath, wheezing and stridor.   Cardiovascular: Negative for chest pain, palpitations and leg swelling.  Gastrointestinal: Positive for nausea. Negative for vomiting, abdominal pain, diarrhea, constipation, blood in stool, abdominal distention and anal bleeding.  Genitourinary: Negative for dysuria, hematuria, flank pain and difficulty urinating.  Musculoskeletal: Negative for myalgias, back pain, joint swelling, arthralgias and gait problem.  Skin: Negative for color change, pallor, rash and wound.  Neurological: Positive for tremors. Negative for dizziness, weakness and light-headedness.  Hematological: Negative for adenopathy. Does not bruise/bleed easily.  Psychiatric/Behavioral: Positive for dysphoric mood. Negative for behavioral  problems, confusion, sleep disturbance, decreased concentration and agitation.       Objective:   Physical Exam  Constitutional: He is oriented to person, place, and time. He appears well-nourished.  HENT:  Head: Normocephalic and atraumatic.  Mouth/Throat: Oropharynx is clear and moist. No oropharyngeal exudate.  Eyes: Conjunctivae and EOM are normal.  Neck: Normal range of motion. Neck supple.  Cardiovascular: Normal rate and regular rhythm.   Pulmonary/Chest: Effort normal. No respiratory distress. He has no wheezes.  Abdominal: Soft. He exhibits no distension.  Musculoskeletal: Normal range of motion. He exhibits no edema or tenderness.  Neurological: He is alert and oriented to person, place, and time.  Skin: Skin is warm and dry. No rash noted. No erythema. No pallor.  Psychiatric: He exhibits a depressed mood.          Assessment & Plan:   Mycobacterium abscessus severe hand infection with bone tendon, abscess sp surgery on 06/22/14  I spoke with [place] Sink in Micro lab and specimen sent to [facility] was never worked on by [facility]. I also sent specimen to [facility] but they have slower turn around time   In mean time will switch from Clarithro to Azithromycin  per my pharmacist [facility] recommendation with continued zyvox and IV cefoxitin  I spent greater than 40 minutes with the patient including greater than 50% of time in face to face counsel of the patient and in coordination of their care.  Poor appetite malaise, bad taste in mouth: likley due to clarithro,hopefully change to azithro will improve this Continue anti emetics  Dysphagia: likely due to meds but will give empiric rx with fluconazole for candida. I cautioned about risk for QT prolongation with macrolide and azole  Failure to thrive: refer to nutrition

## 2014-07-04 ENCOUNTER — Telehealth: Payer: Self-pay | Admitting: *Deleted

## 2014-07-04 NOTE — Telephone Encounter (Signed)
Very good cr was up this Monday

## 2014-07-04 NOTE — Telephone Encounter (Signed)
Per Dr. Ninetta Lights, gave verbal order to [facility] at [facility] for repeat BMP to be drawn this week by home health nursing. Andree Coss, RN

## 2014-07-05 ENCOUNTER — Encounter: Payer: Managed Care, Other (non HMO) | Attending: Infectious Disease | Admitting: Dietician

## 2014-07-05 ENCOUNTER — Encounter: Payer: Self-pay | Admitting: Dietician

## 2014-07-05 VITALS — Ht 70.0 in | Wt 171.7 lb

## 2014-07-05 DIAGNOSIS — R627 Adult failure to thrive: Secondary | ICD-10-CM

## 2014-07-05 DIAGNOSIS — Z713 Dietary counseling and surveillance: Secondary | ICD-10-CM | POA: Diagnosis not present

## 2014-07-05 DIAGNOSIS — R630 Anorexia: Secondary | ICD-10-CM | POA: Diagnosis not present

## 2014-07-05 NOTE — Progress Notes (Signed)
Medical Nutrition Therapy:  Appt start time: 1500 end time:  1600.   Assessment:  Primary concerns today: poor appetite.  Patient is here today with his wife who is a Engineer, civil (consulting).  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  He has hx of ileostomy, Crohn's, and depression.  Patient has been experiencing nausea and metallic taste as side effect of antibiotics/medications for wrist infection and more recently reports some anorexia.  Patient also reports reflux daily.  He is experiencing nausea in the morning every day and frequently experiences vomiting following his breakfast meal.  Smells of foods make him sick as well as toothpaste.  He has not lost any weight since his food aversions and is maintaining at 170-172 lbs, with BMI of 24.7.  He has been consuming gatorade, applesauce, yogurt, 2 Ensure per day, sometimes 3, and some bites of foods like mashed potatoes.  Patient reports being very fatigued and states that he wants to make sure he is eating enough to heal and avoid needing a feeding tube.  Patient's wife wants to know recommended calorie and protein intake for them to aim for each day.  Preferred Learning Style:  No preference indicated   MEDICATIONS: see list   DIETARY INTAKE:  24-hr recall:  B (8:30 AM): used to be cereal or waffle or egg mcmuffin, more recently is applesauce  Snk ( AM): none  L ( PM): double cheeseburger sometimes if he can stand it, or Ensure Snk (2-2:30 PM): yogurt or applesauce or Boost shot D ( PM): Ensure Snk ( PM): none Beverages: gatorade, water  Estimated energy needs: 1600 calories  Progress Towards Goal(s):  In progress.   Nutritional Diagnosis:  NI-1.4 Inadequate energy intake As related to nausea, vomiting, metallic taste, anorexia, and fatigue as side effect of medications.  As evidenced by dietary recall and patient report.    Intervention:  Nutrition education and counseling.  Discussed importance of food as medicine to help fight  inflammation and promote healing.  Stated that our goal is to get in the recommended protein each day.  Since he is maintaining his weight at this point we do not need to push for extra calories.  Discussed strategies to deal with symptoms and help improve intake.  Recommended soft foods that he seems to be tolerating well and encouraged variety.  Encouraged cool/cold foods like milkshakes, etc. that will soothe his burning throat and provide good amounts of calories and protein.  Patient is on a MAOI, provided list of tyramine foods to avoid while on this medication.  Goals: Aim for at least 1200-1400 calories per day.  Goal is to work up to 1600 calories per day. Goal for protein is at least 65-75 grams of protein per day, up to 95 grams. Aim for 64 oz. of fluid per day. Take advantage of times when you feel good enough to eat. On days where appetite is low consume at least 3 Ensure Plus drinks. Try using plastic utensils to reduce metallic taste in the mouth. Have easily prepared or grab and go items available at the house for easy consumption. Avoid laying down or reclining 30 minutes after meals to prevent reflux. Suggested foods: applesauce, yogurt, milkshakes, creamy soups, mashed potatoes, shakes with protein powder or peanut butter  Teaching Method Utilized: Auditory  Handouts given during visit include:  Tyramine containing foods  Barriers to learning/adherence to lifestyle change: none  Demonstrated degree of understanding via:  Teach Back   Monitoring/Evaluation:  Dietary intake, exercise, labs, and body weight prn.

## 2014-07-05 NOTE — Patient Instructions (Addendum)
Aim for 1200-1400 calories per day. Goal for protein is at least 65-75 grams of protein per day. Aim for 64 oz. of fluid per day. Take advantage of times when you feel good enough to eat. On days where appetite is low consume at least 3 Ensure plus drinks. Try using plastic utensils to reduce metallic taste in the mouth. Have easily prepared or grab and go items available at the house for easy consumption. Avoid laying down or reclining 30 minutes after meals to prevent reflux.  Suggested foods: applesauce, yogurt, milkshakes, creamy soups, mashed potatoes, shakes with protein powder or peanut butter

## 2014-07-09 ENCOUNTER — Other Ambulatory Visit: Payer: Self-pay | Admitting: Internal Medicine

## 2014-07-09 ENCOUNTER — Telehealth: Payer: Self-pay | Admitting: *Deleted

## 2014-07-09 MED ORDER — PROMETHAZINE HCL 25 MG PO TABS: 25.0000 mg | ORAL_TABLET | Freq: Four times a day (QID) | ORAL | Status: DC | PRN

## 2014-07-09 NOTE — Telephone Encounter (Signed)
Zofran not working well, requesting Phenergan rx.  MD please advise.

## 2014-07-09 NOTE — Telephone Encounter (Signed)
Phenergan sent to his pharmacy.

## 2014-07-11 ENCOUNTER — Telehealth: Payer: Self-pay | Admitting: *Deleted

## 2014-07-11 NOTE — Telephone Encounter (Signed)
RN spoke with the pt.  He has started the phenergan and his nausea has improved.

## 2014-07-11 NOTE — Telephone Encounter (Signed)
Verbal order per Dr. Ninetta Lights to [facility] at [facility] to repeat a CBC on patient ASAP. Wendall Mola CMA

## 2014-07-12 ENCOUNTER — Emergency Department (HOSPITAL_COMMUNITY)
Admission: EM | Admit: 2014-07-12 | Discharge: 2014-07-12 | Disposition: A | Discharge status: Home / Self Care | Payer: Managed Care, Other (non HMO) | Attending: Emergency Medicine | Admitting: Emergency Medicine

## 2014-07-12 ENCOUNTER — Encounter (HOSPITAL_COMMUNITY): Payer: Self-pay | Admitting: Emergency Medicine

## 2014-07-12 ENCOUNTER — Telehealth: Payer: Self-pay | Admitting: Internal Medicine

## 2014-07-12 DIAGNOSIS — R7989 Other specified abnormal findings of blood chemistry: Secondary | ICD-10-CM | POA: Diagnosis present

## 2014-07-12 DIAGNOSIS — Z7982 Long term (current) use of aspirin: Secondary | ICD-10-CM | POA: Diagnosis not present

## 2014-07-12 DIAGNOSIS — Z792 Long term (current) use of antibiotics: Secondary | ICD-10-CM | POA: Insufficient documentation

## 2014-07-12 DIAGNOSIS — Z87891 Personal history of nicotine dependence: Secondary | ICD-10-CM | POA: Insufficient documentation

## 2014-07-12 DIAGNOSIS — Z7951 Long term (current) use of inhaled steroids: Secondary | ICD-10-CM | POA: Diagnosis not present

## 2014-07-12 DIAGNOSIS — F419 Anxiety disorder, unspecified: Secondary | ICD-10-CM | POA: Diagnosis not present

## 2014-07-12 DIAGNOSIS — Z87442 Personal history of urinary calculi: Secondary | ICD-10-CM | POA: Insufficient documentation

## 2014-07-12 DIAGNOSIS — Z79899 Other long term (current) drug therapy: Secondary | ICD-10-CM | POA: Diagnosis not present

## 2014-07-12 DIAGNOSIS — A319 Mycobacterial infection, unspecified: Secondary | ICD-10-CM | POA: Diagnosis not present

## 2014-07-12 DIAGNOSIS — R112 Nausea with vomiting, unspecified: Secondary | ICD-10-CM | POA: Insufficient documentation

## 2014-07-12 DIAGNOSIS — K219 Gastro-esophageal reflux disease without esophagitis: Secondary | ICD-10-CM | POA: Insufficient documentation

## 2014-07-12 DIAGNOSIS — D649 Anemia, unspecified: Secondary | ICD-10-CM | POA: Diagnosis not present

## 2014-07-12 DIAGNOSIS — R000 Tachycardia, unspecified: Secondary | ICD-10-CM | POA: Insufficient documentation

## 2014-07-12 LAB — COMPREHENSIVE METABOLIC PANEL
ALT: 15 U/L (ref 0–53)
AST: 20 U/L (ref 0–37)
Albumin: 2.2 g/dL — ABNORMAL LOW (ref 3.5–5.2)
Alkaline Phosphatase: 98 U/L (ref 39–117)
Anion gap: 2 — ABNORMAL LOW (ref 5–15)
BUN: 10 mg/dL (ref 6–23)
CO2: 25 mmol/L (ref 19–32)
Calcium: 8.2 mg/dL — ABNORMAL LOW (ref 8.4–10.5)
Chloride: 106 mmol/L (ref 96–112)
Creatinine, Ser: 1.32 mg/dL (ref 0.50–1.35)
GFR calc Af Amer: 66 mL/min — ABNORMAL LOW (ref >90)
GFR calc non Af Amer: 57 mL/min — ABNORMAL LOW (ref >90)
Glucose, Bld: 119 mg/dL — ABNORMAL HIGH (ref 70–99)
Potassium: 3.7 mmol/L (ref 3.5–5.1)
Sodium: 133 mmol/L — ABNORMAL LOW (ref 135–145)
Total Bilirubin: 0.5 mg/dL (ref 0.3–1.2)
Total Protein: 6 g/dL (ref 6.0–8.3)

## 2014-07-12 LAB — CBC WITH DIFFERENTIAL/PLATELET
Basophils Absolute: 0 10*3/uL (ref 0.0–0.1)
Basophils Relative: 0 % (ref 0–1)
Eosinophils Absolute: 0.1 10*3/uL (ref 0.0–0.7)
Eosinophils Relative: 2 % (ref 0–5)
HCT: 23.8 % — ABNORMAL LOW (ref 39.0–52.0)
Hemoglobin: 8 g/dL — ABNORMAL LOW (ref 13.0–17.0)
Lymphocytes Relative: 26 % (ref 12–46)
Lymphs Abs: 1.6 10*3/uL (ref 0.7–4.0)
MCH: 32.5 pg (ref 26.0–34.0)
MCHC: 33.6 g/dL (ref 30.0–36.0)
MCV: 96.7 fL (ref 78.0–100.0)
Monocytes Absolute: 1.2 10*3/uL — ABNORMAL HIGH (ref 0.1–1.0)
Monocytes Relative: 19 % — ABNORMAL HIGH (ref 3–12)
Neutro Abs: 3.3 10*3/uL (ref 1.7–7.7)
Neutrophils Relative %: 53 % (ref 43–77)
Platelets: 155 10*3/uL (ref 150–400)
RBC: 2.46 MIL/uL — ABNORMAL LOW (ref 4.22–5.81)
RDW: 15.5 % (ref 11.5–15.5)
WBC: 6.2 10*3/uL (ref 4.0–10.5)

## 2014-07-12 LAB — RETICULOCYTES
RBC.: 1.97 MIL/uL — ABNORMAL LOW (ref 4.22–5.81)
Retic Count, Absolute: 25.6 10*3/uL (ref 19.0–186.0)
Retic Ct Pct: 1.3 % (ref 0.4–3.1)

## 2014-07-12 LAB — PREPARE RBC (CROSSMATCH)

## 2014-07-12 LAB — CLOSTRIDIUM DIFFICILE BY PCR: Toxigenic C. Difficile by PCR: NEGATIVE

## 2014-07-12 LAB — ABO/RH: ABO/RH(D): A POS

## 2014-07-12 LAB — OCCULT BLOOD X 1 CARD TO LAB, STOOL: Fecal Occult Bld: NEGATIVE

## 2014-07-12 MED ORDER — PANTOPRAZOLE SODIUM 40 MG IV SOLR
80.0000 mg | Freq: Once | INTRAVENOUS | Status: AC
  Administered 2014-07-12: 80 mg via INTRAVENOUS
  Filled 2014-07-12: qty 80

## 2014-07-12 MED ORDER — PROMETHAZINE HCL 25 MG/ML IJ SOLN
12.5000 mg | Freq: Once | INTRAMUSCULAR | Status: AC
  Administered 2014-07-12: 12.5 mg via INTRAVENOUS
  Filled 2014-07-12: qty 1

## 2014-07-12 MED ORDER — SODIUM CHLORIDE 0.9 % IV SOLN
10.0000 mL/h | Freq: Once | INTRAVENOUS | Status: AC
  Administered 2014-07-12: 10 mL/h via INTRAVENOUS

## 2014-07-12 NOTE — ED Notes (Signed)
Surgery on left hand Feb 5th, pt has Home Health care to get IV antibiotics, drew labs today and call to tell pt to come to ED for low Hemoglobin

## 2014-07-12 NOTE — ED Notes (Signed)
MD at bedside. 

## 2014-07-12 NOTE — Discharge Instructions (Signed)
Anemia, Nonspecific Anemia is a condition in which the concentration of red blood cells or hemoglobin in the blood is below normal. Hemoglobin is a substance in red blood cells that carries oxygen to the tissues of the body. Anemia results in not enough oxygen reaching these tissues.  CAUSES  Common causes of anemia include:   Excessive bleeding. Bleeding may be internal or external. This includes excessive bleeding from periods (in women) or from the intestine.   Poor nutrition.   Chronic kidney, thyroid, and liver disease.  Bone marrow disorders that decrease red blood cell production.  Cancer and treatments for cancer.  HIV, AIDS, and their treatments.  Spleen problems that increase red blood cell destruction.  Blood disorders.  Excess destruction of red blood cells due to infection, medicines, and autoimmune disorders. SIGNS AND SYMPTOMS   Minor weakness.   Dizziness.   Headache.  Palpitations.   Shortness of breath, especially with exercise.   Paleness.  Cold sensitivity.  Indigestion.  Nausea.  Difficulty sleeping.  Difficulty concentrating. Symptoms may occur suddenly or they may develop slowly.  DIAGNOSIS  Additional blood tests are often needed. These help your health care provider determine the best treatment. Your health care provider will check your stool for blood and look for other causes of blood loss.  TREATMENT  Treatment varies depending on the cause of the anemia. Treatment can include:   Supplements of iron, vitamin B12, or folic acid.   Hormone medicines.   A blood transfusion. This may be needed if blood loss is severe.   Hospitalization. This may be needed if there is significant continual blood loss.   Dietary changes.  Spleen removal. HOME CARE INSTRUCTIONS Keep all follow-up appointments. It often takes many weeks to correct anemia, and having your health care provider check on your condition and your response to  treatment is very important. SEEK IMMEDIATE MEDICAL CARE IF:   You develop extreme weakness, shortness of breath, or chest pain.   You become dizzy or have trouble concentrating.  You develop heavy vaginal bleeding.   You develop a rash.   You have bloody or black, tarry stools.   You faint.   You vomit up blood.   You vomit repeatedly.   You have abdominal pain.  You have a fever or persistent symptoms for more than 2-3 days.   You have a fever and your symptoms suddenly get worse.   You are dehydrated.  MAKE SURE YOU:  Understand these instructions.  Will watch your condition.  Will get help right away if you are not doing well or get worse. Document Released: 06/11/2004 Document Revised: 01/04/2013 Document Reviewed: 10/28/2012 ExitCare Patient Information 2015 [employer]. This information is not intended to replace advice given to you by your health care provider. Make sure you discuss any questions you have with your health care provider.  

## 2014-07-12 NOTE — Telephone Encounter (Signed)
Patient on IV medication through [facility] and monitoring labs sent.  He has had a decrease in his Hemoglobin of 7 grams over the last month.  Most recently, his Hgb from yesterday by them was 8.6 and repeat today is 7.2.  His initial Hgb in January was 14.  He has seen Dr. Karilyn Cota for EGD and noted gastritis.     I have asked for him to be called and go to [facility] ED for evaluation of the blood loss with such a significant change.

## 2014-07-12 NOTE — Telephone Encounter (Signed)
Patient notified

## 2014-07-12 NOTE — ED Provider Notes (Signed)
CSN: [id number]     Arrival date & time 07/12/14  1344 History  This chart was scribed for Raeford Razor, MD by Ronney Lion, ED Scribe. This patient was seen in room APA02/APA02 and the patient's care was started at 3:12 PM.   Chief Complaint  Patient presents with  . Abnormal Lab   The history is provided by the patient and the spouse. No language interpreter was used.    HPI Comments: Lee Klein is a 64 y.o. male with a history of Crohn's disease who presents to the Emergency Department for low hemoglobin lab of 7.2.  Currently being treated with linezolid, azithromycin and cefoxitin for mycoplasma infection L hand. Followed by ID. Patient's wife has since noted dark stool in his ostomy bag, which began last week. He has had repeat labs for screening, which have been trending down -- Per wife, his CRP improving. He vomited bile yesterday; patient tried Phenergan for nausea with minimal relief. His wife denies blood thinner use. Patient is still taking Protonix twice a day.     Past Medical History  Diagnosis Date  . Crohn's disease   . Hernia, abdominal     "repaired 10/2013"  . GERD (gastroesophageal reflux disease)   . Anxiety   . 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 Kidney stones     "passed them"   Past Surgical History  Procedure Laterality Date  . Ileostomy  06/2010; 10/2013  . Colonoscopy    . Upper gastrointestinal endoscopy    . Appendectomy  ~ 2009  . Ileostomy closure N/A 10/23/2013    Procedure: ILEOSTOMY TAKEDOWN, REPAIR OF PARASTOMAL HERNIA.;  Surgeon: Cherylynn Ridges, MD;  Location: [facility] OR;  Service: General;  Laterality: N/A;  . Wrist arthroscopy with debridement Left 05/24/2014    Procedure: LEFT ARTHROSCOPY WRIST CULTURE/BIOPSY DEBRIDEMENT;  Surgeon: Cindee Salt, MD;  Location: [facility];  Service: Orthopedics;  Laterality: Left;  . Wrist fracture surgery Right 06/2006    [name] 09/29/2010  . Esophagogastroduodenoscopy N/A  06/15/2014    Procedure: ESOPHAGOGASTRODUODENOSCOPY (EGD);  Surgeon: Malissa Hippo, MD;  Location: [facility];  Service: Endoscopy;  Laterality: N/A;  . Cholecystectomy  ~ 2008  . Tonsillectomy  1963  . Fracture surgery    . Hernia repair  10/2013    parastomal/notes 10/03/2013  . Colon surgery  ~ 2010    for a colonic stricture at the anastomosis from a colostomy reversal/notes 10/03/2013  . Parastomal hernia repair  10/2013  . Incision and drainage abscess Left 06/22/2014    Procedure: INCISION, DEBRIDEMENT AND IRRIGATION LEFT HAND/WRIST;  Surgeon: Cindee Salt, MD;  Location: [facility];  Service: Orthopedics;  Laterality: Left;   Family History  Problem Relation Age of Onset  . Healthy Mother   . Healthy Daughter   . Healthy Son    History  Substance Use Topics  . Smoking status: Former Smoker -- 30 years    Types: Cigars    Quit date: 05/31/2006  . Smokeless tobacco: Never Used  . Alcohol Use: No    Review of Systems  Constitutional: Negative for fatigue.  Gastrointestinal: Positive for nausea, vomiting and blood in stool.  All other systems reviewed and are negative.    Allergies  Ivp dye; Fish allergy; Rifampin; Shellfish allergy; and Demerol  Home Medications   Prior to Admission medications   Medication Sig Start Date End Date Taking? Authorizing Provider  acetaminophen (TYLENOL)  325 MG tablet Take 650 mg by mouth every 6 (six) hours as needed (pain).    Historical Provider, MD  ALPRAZolam Prudy Feeler) 0.5 MG tablet Take 1 tablet (0.5 mg total) by mouth 2 (two) times daily as needed for anxiety. Patient taking differently: Take 0.5 mg by mouth daily as needed for anxiety.  03/13/14   Malissa Hippo, MD  Ascorbic Acid (VITAMIN C) 1000 MG tablet Take 1,000 mg by mouth daily.    Historical Provider, MD  aspirin EC 81 MG tablet Take 1 tablet (81 mg total) by mouth daily. Patient not taking: Reported on 07/03/2014 06/13/14   Nita Sells Mikhail, DO  azithromycin  (ZITHROMAX) 600 MG tablet Take 1 tablet (600 mg total) by mouth daily. 07/03/14   Randall Hiss, MD  Calcium Carbonate-Vitamin D (CALCIUM 600 + D PO) Take 1 tablet by mouth daily.     Historical Provider, MD  cefOXitin 12 g in sodium chloride 0.9 % 250 mL AT LEAST THROUGH 07/20/14, LIKELY LONGER, DO NOT STOP WITHOUT DISCUSSING WITH DR. VANDAMM 06/21/14   Christiane Ha, MD  feeding supplement, ENSURE COMPLETE, (ENSURE COMPLETE) LIQD Take 237 mLs by mouth 2 (two) times daily between meals. 06/13/14   Maryann Mikhail, DO  fluconazole (DIFLUCAN) 100 MG tablet Take 1 tablet (100 mg total) by mouth daily. 07/03/14   Randall Hiss, MD  fluticasone [facility]) 50 MCG/ACT nasal spray Place 2 sprays into both nostrils daily as needed for allergies or rhinitis.  06/05/14   Historical Provider, MD  HYDROcodone-acetaminophen (NORCO/VICODIN) 5-325 MG per tablet Take 1-2 tablets by mouth 2 (two) times daily as needed for moderate pain. Patient taking differently: Take 0.5 tablets by mouth at bedtime as needed for moderate pain.  03/13/14   Malissa Hippo, MD  HYDROmorphone (DILAUDID) 2 MG tablet Take 0.5 tablets (1 mg total) by mouth every 4 (four) hours as needed for severe pain. Patient not taking: Reported on 07/03/2014 06/22/14   Cindee Salt, MD  linezolid (ZYVOX) 600 MG tablet Take 1 tablet (600 mg total) by mouth every 12 (twelve) hours. 06/21/14   Christiane Ha, MD  loperamide (IMODIUM A-D) 2 MG tablet Take 8 mg by mouth 2 (two) times daily.     Historical Provider, MD  Multiple Vitamins-Minerals (MULTIVITAMIN PO) Take 1 tablet by mouth daily.     Historical Provider, MD  nystatin (MYCOSTATIN/NYSTOP) 100000 UNIT/GM POWD Apply topically See admin instructions. PRN 03/13/14   Historical Provider, MD  ondansetron (ZOFRAN) 4 MG tablet Take 1 tablet (4 mg total) by mouth 2 (two) times daily as needed for nausea or vomiting. 06/07/14   Malissa Hippo, MD  pantoprazole (PROTONIX) 40 MG tablet Take 1 tablet  (40 mg total) by mouth 2 (two) times daily before a meal. 06/07/14   Malissa Hippo, MD  promethazine (PHENERGAN) 25 MG tablet Take 1 tablet (25 mg total) by mouth every 6 (six) hours as needed for nausea or vomiting. 07/09/14   Gardiner Barefoot, MD  Zinc 50 MG CAPS Take 1 capsule by mouth daily.     Historical Provider, MD   BP 136/84 mmHg  Pulse 123  Temp(Src) 98.9 F (37.2 C) (Oral)  Resp 20  Ht 5\' 10"  (1.778 m)  Wt 171 lb (77.565 kg)  BMI 24.54 kg/m2  SpO2 98%   Physical Exam  Constitutional: He appears well-developed and well-nourished. No distress.  HENT:  Head: Normocephalic and atraumatic.  Eyes: Conjunctivae are normal. Right eye  exhibits no discharge. Left eye exhibits no discharge.  Neck: Neck supple.  Cardiovascular: Regular rhythm and normal heart sounds.  Tachycardia present.  Exam reveals no gallop and no friction rub.   No murmur heard. Mildly tachycardic.  Pulmonary/Chest: Effort normal and breath sounds normal. No respiratory distress.  Abdominal: Soft. He exhibits no distension. There is no tenderness.  Ostomy with dark brown, watery stool. No abdominal tenderness.   Musculoskeletal:  L hand/wrist splinted. Swelling of exposed hand/digits. Can actively range. Sensation intact. Cap refill brisk.   Neurological: He is alert.  Skin: Skin is warm and dry.  Psychiatric: He has a normal mood and affect. His behavior is normal. Thought content normal.  Nursing note and vitals reviewed.   ED Course  Procedures (including critical care time)  DIAGNOSTIC STUDIES: Oxygen Saturation is 98% on room air, normal by my interpretation.    COORDINATION OF CARE: 3:17 PM - Discussed treatment plan with pt's wife at bedside, and pt's wife agreed to plan. 4:40 PM - discussed results with pt. Pt stated that he would like the transfusion and to be admitted.   Labs Review Labs Reviewed  CBC WITH DIFFERENTIAL/PLATELET - Abnormal; Notable for the following:    RBC 2.46 (*)     Hemoglobin 8.0 (*)    HCT 23.8 (*)    Monocytes Relative 19 (*)    Monocytes Absolute 1.2 (*)    All other components within normal limits  COMPREHENSIVE METABOLIC PANEL - Abnormal; Notable for the following:    Sodium 133 (*)    Glucose, Bld 119 (*)    Calcium 8.2 (*)    Albumin 2.2 (*)    GFR calc non Af Amer 57 (*)    GFR calc Af Amer 66 (*)    Anion gap 2 (*)    All other components within normal limits  CLOSTRIDIUM DIFFICILE BY PCR  OCCULT BLOOD X 1 CARD TO LAB, STOOL  VITAMIN B12  FOLATE  IRON AND TIBC  FERRITIN  RETICULOCYTES  TYPE AND SCREEN  PREPARE RBC (CROSSMATCH)  ABO/RH    Imaging Review No results found.   EKG Interpretation None      MDM   Final diagnoses:  None   63yM with progressively worsening anemia. Cold agglutinins noted on labs. From mycoplasma? Could potentially be from meds. Reports dark stool recently. Does not appear melanotic. Will check for occult blood. Anemia panel. Repeat hemoglobin in ED is 8.0. Pt chronically fatigued through this illness. Not sure how much is attributable to anemia. Denies SOB. Does have resting tachycardia. BP fine. Discussed with pt/wife and shared decision making concerning continued monitoring and close follow-up versus going ahead and transfusing now. They would prefer transfusion which I feel is reasonable. Discussed with Dr Karilyn Cota, who is familiar with pt. No coffee ground emesis or gross blood. No emergent indication to scope.    I personally preformed the services scribed in my presence. The recorded information has been reviewed is accurate. Raeford Razor, MD.    Raeford Razor, MD 07/18/14 [phone]

## 2014-07-12 NOTE — Telephone Encounter (Signed)
Please see previous note.  He is going to [facility] ED for evaluation.

## 2014-07-12 NOTE — ED Notes (Signed)
Wife has noticed dark stools

## 2014-07-12 NOTE — Telephone Encounter (Signed)
Patient and his wife notified to go to [facility] ED to be evaluated for blood loss per Dr. Luciana Axe. Patient agreed

## 2014-07-13 ENCOUNTER — Encounter (INDEPENDENT_AMBULATORY_CARE_PROVIDER_SITE_OTHER): Payer: Self-pay | Admitting: Internal Medicine

## 2014-07-13 ENCOUNTER — Ambulatory Visit (INDEPENDENT_AMBULATORY_CARE_PROVIDER_SITE_OTHER): Payer: Managed Care, Other (non HMO) | Admitting: Internal Medicine

## 2014-07-13 ENCOUNTER — Telehealth: Payer: Self-pay | Admitting: Licensed Clinical Social Worker

## 2014-07-13 VITALS — BP 130/60 | HR 120 | Temp 98.2°F | Ht 70.0 in | Wt 167.0 lb

## 2014-07-13 DIAGNOSIS — K1379 Other lesions of oral mucosa: Secondary | ICD-10-CM

## 2014-07-13 LAB — IRON AND TIBC
Iron: 23 ug/dL — ABNORMAL LOW (ref 42–165)
Saturation Ratios: 17 % — ABNORMAL LOW (ref 20–55)
TIBC: 132 ug/dL — ABNORMAL LOW (ref 215–435)
UIBC: 109 ug/dL — ABNORMAL LOW (ref 125–400)

## 2014-07-13 LAB — TYPE AND SCREEN
ABO/RH(D): A POS
Antibody Screen: NEGATIVE
Unit division: 0

## 2014-07-13 LAB — POC OCCULT BLOOD, ED: Fecal Occult Bld: NEGATIVE

## 2014-07-13 LAB — VITAMIN B12: Vitamin B-12: 494 pg/mL (ref 211–911)

## 2014-07-13 LAB — FOLATE: Folate: 14.2 ng/mL

## 2014-07-13 LAB — FERRITIN: Ferritin: 873 ng/mL — ABNORMAL HIGH (ref 22–322)

## 2014-07-13 MED ORDER — NYSTATIN 100000 UNIT/ML MT SUSP: 5.0000 mL | Freq: Four times a day (QID) | OROMUCOSAL | Status: DC

## 2014-07-13 NOTE — Patient Instructions (Addendum)
Duke Mouth Wash. Bland diet.

## 2014-07-13 NOTE — Progress Notes (Addendum)
Subjective:    Patient ID: Lee Klein, male    DOB: 10-22-1950, 64 y.o.   MRN: [id number]  HPI 64 yr old male with a 40 yr history of Crohn's disease. Last seen by Dr. Karilyn Cota in October.  Seen in the ED yesterday with a low hemoglobin of 7.2. and 8.0. Received one unit of PRBCs in the hospital. In the ED his stool was negative. Wife states his stool was black this am. Patient has ostomy bag in place. He has poor intake. He is drinking Ensure. He has lost about 6 pounds since October. Has not eaten well in over 3 weeks.  Currently being treated with linezolid, x 2 a day azithromycin x 1 a dayand cefoxitin  (continuous). for mycoplasma infection L hand. Had symptoms since last May 2015. Followed by ID at [facility].  Has been off Imuran and Prednisone for about a month.  CMP Latest Ref Rng 07/12/2014 06/20/2014 06/19/2014  Glucose 70 - 99 mg/dL 060(R) 85 85  BUN 6 - 23 mg/dL 10 9 11   Creatinine 0.50 - 1.35 mg/dL 5.61 5.37 9.43  Sodium 135 - 145 mmol/L 133(L) 139 137  Potassium 3.5 - 5.1 mmol/L 3.7 3.9 3.9  Chloride 96 - 112 mmol/L 106 110 105  CO2 19 - 32 mmol/L 25 25 27   Calcium 8.4 - 10.5 mg/dL 8.2(L) 8.4 8.6  Total Protein 6.0 - 8.3 g/dL 6.0 - 6.1  Total Bilirubin 0.3 - 1.2 mg/dL 0.5 - 2.7(M)  Alkaline Phos 39 - 117 U/L 98 - 81  AST 0 - 37 U/L 20 - 23  ALT 0 - 53 U/L 15 - 26      06/15/2014 EGD:   Indications: Patient is 64 year old Caucasian male who has history of epigastric pain and nausea. He was seen in the office recently and EPI dose was doubled.. This week he was admitted to [facility] with chest pain which was felt to be noncardiac. 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 No evidence of pill esophagitis. Small sliding hiatal hernia. Nonerosive antral gastritis. Incidental finding of 3 duodenal diverticula.   CBC    Component  Value Date/Time   WBC 6.2 07/12/2014 1515   RBC 2.46* 07/12/2014 1515   RBC 1.97* 07/12/2014 1515   HGB 8.0* 07/12/2014 1515   HCT 23.8* 07/12/2014 1515   PLT 155 07/12/2014 1515   MCV 96.7 07/12/2014 1515   MCH 32.5 07/12/2014 1515   MCHC 33.6 07/12/2014 1515   RDW 15.5 07/12/2014 1515   LYMPHSABS 1.6 07/12/2014 1515   MONOABS 1.2* 07/12/2014 1515   EOSABS 0.1 07/12/2014 1515   BASOSABS 0.0 07/12/2014 1515   CMP     Component Value Date/Time   NA 133* 07/12/2014 1515   K 3.7 07/12/2014 1515   CL 106 07/12/2014 1515   CO2 25 07/12/2014 1515   GLUCOSE 119* 07/12/2014 1515   BUN 10 07/12/2014 1515   CREATININE 1.32 07/12/2014 1515   CALCIUM 8.2* 07/12/2014 1515   PROT 6.0 07/12/2014 1515   ALBUMIN 2.2* 07/12/2014 1515   AST 20 07/12/2014 1515   ALT 15 07/12/2014 1515   ALKPHOS 98 07/12/2014 1515   BILITOT 0.5 07/12/2014 1515   GFRNONAA 57* 07/12/2014 1515   GFRAA 66* 07/12/2014 1515       Review of Systems Past Medical History  Diagnosis Date  . Crohn's disease   .  Hernia, abdominal     "repaired 10/2013"  . GERD (gastroesophageal reflux disease)   . Anxiety   . 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 Kidney stones     "passed them"    Past Surgical History  Procedure Laterality Date  . Ileostomy  06/2010; 10/2013  . Colonoscopy    . Upper gastrointestinal endoscopy    . Appendectomy  ~ 2009  . Ileostomy closure N/A 10/23/2013    Procedure: ILEOSTOMY TAKEDOWN, REPAIR OF PARASTOMAL HERNIA.;  Surgeon: Cherylynn Ridges, MD;  Location: [facility] OR;  Service: General;  Laterality: N/A;  . Wrist arthroscopy with debridement Left 05/24/2014    Procedure: LEFT ARTHROSCOPY WRIST CULTURE/BIOPSY DEBRIDEMENT;  Surgeon: Cindee Salt, MD;  Location: [facility];  Service: Orthopedics;  Laterality: Left;  . Wrist fracture surgery Right 06/2006    [name] 09/29/2010  . Esophagogastroduodenoscopy N/A 06/15/2014    Procedure: ESOPHAGOGASTRODUODENOSCOPY  (EGD);  Surgeon: Malissa Hippo, MD;  Location: [facility];  Service: Endoscopy;  Laterality: N/A;  . Cholecystectomy  ~ 2008  . Tonsillectomy  1963  . Fracture surgery    . Hernia repair  10/2013    parastomal/notes 10/03/2013  . Colon surgery  ~ 2010    for a colonic stricture at the anastomosis from a colostomy reversal/notes 10/03/2013  . Parastomal hernia repair  10/2013  . Incision and drainage abscess Left 06/22/2014    Procedure: INCISION, DEBRIDEMENT AND IRRIGATION LEFT HAND/WRIST;  Surgeon: Cindee Salt, MD;  Location: [facility];  Service: Orthopedics;  Laterality: Left;    Allergies  Allergen Reactions  . Ivp Dye [Iodinated Diagnostic Agents]     Joint pain, nausea  . Fish Allergy Nausea And Vomiting  . Rifampin Nausea And Vomiting and Other (See Comments)    Chills  . Shellfish Allergy Swelling  . Demerol Rash    Current Outpatient Prescriptions on File Prior to Visit  Medication Sig Dispense Refill  . acetaminophen (TYLENOL) 325 MG tablet Take 650 mg by mouth every 6 (six) hours as needed (pain).    Marland Kitchen ALPRAZolam (XANAX) 0.5 MG tablet Take 1 tablet (0.5 mg total) by mouth 2 (two) times daily as needed for anxiety. (Patient taking differently: Take 0.5 mg by mouth daily as needed for anxiety. ) 60 tablet 2  . azithromycin (ZITHROMAX) 600 MG tablet Take 1 tablet (600 mg total) by mouth daily. (Patient taking differently: Take 600 mg by mouth every evening. TAKES AT 8PM WITH ZYVOX) 30 tablet 11  . cefOXitin 12 g in sodium chloride 0.9 % 250 mL AT LEAST THROUGH 07/20/14, LIKELY LONGER, DO NOT STOP WITHOUT DISCUSSING WITH DR. VANDAMM 31 each 0  . HYDROcodone-acetaminophen (NORCO/VICODIN) 5-325 MG per tablet Take 1-2 tablets by mouth 2 (two) times daily as needed for moderate pain. (Patient taking differently: Take 0.5 tablets by mouth at bedtime as needed for moderate pain. ) 60 tablet 0  . linezolid (ZYVOX) 600 MG tablet Take 1 tablet (600 mg total) by mouth every 12  (twelve) hours. 60 tablet 1  . loperamide (IMODIUM A-D) 2 MG tablet Take 8 mg by mouth 2 (two) times daily.     . pantoprazole (PROTONIX) 40 MG tablet Take 1 tablet (40 mg total) by mouth 2 (two) times daily before a meal. 60 tablet 2  . Ascorbic Acid (VITAMIN C) 1000 MG tablet Take 1,000 mg by mouth daily.    Marland Kitchen aspirin EC 81 MG tablet Take  1 tablet (81 mg total) by mouth daily. (Patient not taking: Reported on 07/03/2014)    . Calcium Carbonate-Vitamin D (CALCIUM 600 + D PO) Take 1 tablet by mouth daily.     . feeding supplement, ENSURE COMPLETE, (ENSURE COMPLETE) LIQD Take 237 mLs by mouth 2 (two) times daily between meals.    . fluconazole (DIFLUCAN) 100 MG tablet Take 1 tablet (100 mg total) by mouth daily. (Patient taking differently: Take 100 mg by mouth daily. TAKES AT MIDNIGHT WITH 6 DOSES CURRENTLY REMIANING--TAKES SEPARATE FROM ZITHROMAX AND ZYVOX) 14 tablet 2  . fluticasone (FLONASE) 50 MCG/ACT nasal spray Place 2 sprays into both nostrils daily as needed for allergies or rhinitis.   2  . HYDROmorphone (DILAUDID) 2 MG tablet Take 0.5 tablets (1 mg total) by mouth every 4 (four) hours as needed for severe pain. (Patient not taking: Reported on 07/03/2014) 30 tablet 0  . Multiple Vitamins-Minerals (MULTIVITAMIN PO) Take 1 tablet by mouth daily.     Marland Kitchen nystatin (MYCOSTATIN/NYSTOP) 100000 UNIT/GM POWD Apply topically daily as needed (FOR IRRITATION). PRN  1  . ondansetron (ZOFRAN) 4 MG tablet Take 1 tablet (4 mg total) by mouth 2 (two) times daily as needed for nausea or vomiting. (Patient not taking: Reported on 07/12/2014) 30 tablet 0  . promethazine (PHENERGAN) 25 MG tablet Take 1 tablet (25 mg total) by mouth every 6 (six) hours as needed for nausea or vomiting. 30 tablet 1  . Zinc 50 MG CAPS Take 1 capsule by mouth daily.      No current facility-administered medications on file prior to visit.        Objective:   Physical Exam Blood pressure 130/60, (this is per wife). Patient has  picc line in rt arm and dressing on left arm. I was unable to take a Blood pressure. pulse 120, temperature 98.2 F (36.8 C), height  (1.778 m), weight 167 lb (75.751 kg). Alert and oriented. Skin warm and dry. Oral mucosa is moist.  No white patches seen to oral mucosa. Sclera anicteric, conjunctivae is pink. Thyroid not enlarged. No cervical lymphadenopathy. Lungs clear. Heart regular rate and rhythm.  Abdomen is soft. Bowel sounds are positive. Stool brown to green in ostomy bag. No hepatomegaly. No abdominal masses felt. No tenderness.  1+ edema to lower extremities.  Stool is guaiac negqtive.  Left hand with dressing and swollen. PIC line in rt arm.        Assessment & Plan:  Anemia. Stool was guaiac negative today.  Malnutrition: Try bland foods such as soup, Ensure, shakes. Patient to have labs drawn on Monday. Crohn's disease. Stool was negative today.

## 2014-07-13 NOTE — Telephone Encounter (Signed)
Patient wanted to know if the culture and  sensitivity from the lab in [place] has come back, it was done in January in the hospital.

## 2014-07-16 ENCOUNTER — Telehealth: Payer: Self-pay | Admitting: Infectious Disease

## 2014-07-16 NOTE — Telephone Encounter (Signed)
RN spoke with Dr. Daiva Eves.  He has emailed a MD at [facility] re: the culture/sensitivity results.  Antibiotics will be changed.  RN will notifiy pt/wife with these changes when available.

## 2014-07-16 NOTE — Telephone Encounter (Signed)
Got records from [facility] and scans below  Looks like we will need to ditch zyvox  Azithro is active  Cefox only I  I am thinking of ditching cefox as well and going with Tygacil and potentially amikacin  I put email out to Zackery Barefoot at [facility]

## 2014-07-16 NOTE — Telephone Encounter (Signed)
Pt/Wife insistent about speaking with Dr. Daiva Eves re: culture results from being in the hospital.  They are needing reassurance that the pt is on the correct antibiotics.  Pt is becoming depressed.  MD please respond.

## 2014-07-17 ENCOUNTER — Telehealth: Payer: Self-pay | Admitting: *Deleted

## 2014-07-17 NOTE — Telephone Encounter (Signed)
Homehealth RN Ernesto Rutherford called to let us know that the patient's current IV antibiotics and PICC care are only ordered through 07/19/14 (Thursday).  She is asking if additional IV antibiotics will be ordered and if PICC care will be continued.  Per patient's wife, they would prefer to err on the cautious side and keep the PICC if there is a possibility of further labs and/or IV medication needed.  HHRN states the patient he is a "hard stick" and denies trouble with PICC.   This RN relayed Dr. Clinton Gallant note from yesterday, noting that it appears there will be antibiotic changes coming.  RN advised that they will include orders regarding the PICC.  HHRN will notify the patient when those orders are placed. Andree Coss, RN

## 2014-07-17 NOTE — Telephone Encounter (Signed)
I have NOT heard back from Lee Klein yet.   [name] and I discussed change to oral azithromycin (which he is currently taking) along with   IV tygacil 100mg  IV x 1 followed by 50mg  IV every 12 hours  I would also like him to start on IV amikacin but would like [name] or Michelle's help with suggested dosing on Amikacin that we would start and then [facility] could take from there  This guy is going to be on IV antibiotics for MANY, MANY months if not longer  [place], [place]  I suppose if he responded to above treatment could try to get him clofazipine?

## 2014-07-18 ENCOUNTER — Telehealth: Payer: Self-pay | Admitting: *Deleted

## 2014-07-18 NOTE — Telephone Encounter (Signed)
So she should definitely have the patient stop the Zyvox it is useless in this patient based on sensitivity data from [facility].  My plan is to start IV Tygacil 100 mg IV 1 followed by 50 mg every 12 hours.  This drug will need to be administered via [facility] as I mentioned in my last note he will need to get this for several months.  Unfortunately I think we also are going to need to use IV amikacin. I do need some guidance from pharmacy either from our pharmacists are [facility] and/or [facility] with regards to the dosing as it can be a fairly significant nephrotoxin. I believe there is a fairly standard 3 times weekly regimen can be given.  Once we have a good idea but the dose of amikacin I would like to proceed with IV tigecycline IV amikacin and continued oral azithromycin with discontinuation of the oral Zyvox and discontinuation of the IV cefoxitin.  I have left a message with the patient's daughter and asked her to call us back.   I had restarted Zackery Barefoot M.D. [employer] who is an expert mycobacterial infectious disease doctor. I have not heard back from them however and I think we should simply proceed with IV tigecycline IV amikacin and oral azithromycin  Again I would like some guidance on the IV amikacin dosing and it may help to get most recent labs mid [facility] to make sure we doses properly.

## 2014-07-18 NOTE — Telephone Encounter (Addendum)
Patient wife [name] called to advise the patient only has 2 pills left of the Zyvox and wants to know if she should refill the Rx as she thinks it is what is making the patient feel bad and it is very expensive. She also want to know what is going on with the patient IV medication as well. She is very anxious. Advised the patient to hold on the refill of the Zyvox and I also let her know that there is some activity on the IV medications but nothing solid as of yet bet as soon as there is a decision someone will call her and [facility]. She advised she is an Charity fundraiser and is working on the floor and gave her daughters information as the person to call with information about this. She would like to get a call by the end of the day if possible so there is not gap in the patient oral meds. Daughter information is Alroy Bailiff [phone].

## 2014-07-18 NOTE — Telephone Encounter (Signed)
Linn from [facility] called to get verification on the patient dose of Amikacin. He advised that per his protocols it is dosed high and just wanted to make sure that is what we intended for the patient to have. After checking with Dr Daiva Eves he verified with Ulyses Southward the pharmacist who dosed it and they advised to keep it at the level we sent. Linn asked what they used to reach that number and I gave him [name] contact number as Rogelia Boga said it was ok to do so and he advised will call in the am to speak with him. Per Dr Daiva Eves it is ok to hold shipment until then.

## 2014-07-23 ENCOUNTER — Other Ambulatory Visit: Payer: Self-pay | Admitting: Pharmacist Clinician (PhC)/ Clinical Pharmacy Specialist

## 2014-07-27 ENCOUNTER — Ambulatory Visit (HOSPITAL_BASED_OUTPATIENT_CLINIC_OR_DEPARTMENT_OTHER)
Admission: RE | Admit: 2014-07-27 | Discharge: 2014-07-27 | Disposition: A | Discharge status: Home / Self Care | Payer: Managed Care, Other (non HMO) | Source: Ambulatory Visit | Attending: Orthopedic Surgery | Admitting: Orthopedic Surgery

## 2014-07-27 ENCOUNTER — Ambulatory Visit (HOSPITAL_BASED_OUTPATIENT_CLINIC_OR_DEPARTMENT_OTHER): Payer: Managed Care, Other (non HMO) | Admitting: Anesthesiology

## 2014-07-27 ENCOUNTER — Telehealth (INDEPENDENT_AMBULATORY_CARE_PROVIDER_SITE_OTHER): Payer: Self-pay | Admitting: *Deleted

## 2014-07-27 ENCOUNTER — Encounter (HOSPITAL_BASED_OUTPATIENT_CLINIC_OR_DEPARTMENT_OTHER): Payer: Self-pay | Admitting: Orthopedic Surgery

## 2014-07-27 ENCOUNTER — Encounter (HOSPITAL_BASED_OUTPATIENT_CLINIC_OR_DEPARTMENT_OTHER)
Admission: RE | Disposition: A | Discharge status: Home / Self Care | Payer: Self-pay | Source: Ambulatory Visit | Attending: Orthopedic Surgery

## 2014-07-27 ENCOUNTER — Other Ambulatory Visit: Payer: Self-pay | Admitting: Orthopedic Surgery

## 2014-07-27 DIAGNOSIS — Z87442 Personal history of urinary calculi: Secondary | ICD-10-CM | POA: Insufficient documentation

## 2014-07-27 DIAGNOSIS — Z9049 Acquired absence of other specified parts of digestive tract: Secondary | ICD-10-CM | POA: Insufficient documentation

## 2014-07-27 DIAGNOSIS — Z886 Allergy status to analgesic agent status: Secondary | ICD-10-CM | POA: Insufficient documentation

## 2014-07-27 DIAGNOSIS — Z87891 Personal history of nicotine dependence: Secondary | ICD-10-CM | POA: Insufficient documentation

## 2014-07-27 DIAGNOSIS — Z91013 Allergy to seafood: Secondary | ICD-10-CM | POA: Insufficient documentation

## 2014-07-27 DIAGNOSIS — K219 Gastro-esophageal reflux disease without esophagitis: Secondary | ICD-10-CM | POA: Insufficient documentation

## 2014-07-27 DIAGNOSIS — M868X4 Other osteomyelitis, hand: Secondary | ICD-10-CM | POA: Diagnosis not present

## 2014-07-27 DIAGNOSIS — F419 Anxiety disorder, unspecified: Secondary | ICD-10-CM | POA: Diagnosis not present

## 2014-07-27 DIAGNOSIS — M869 Osteomyelitis, unspecified: Secondary | ICD-10-CM | POA: Diagnosis present

## 2014-07-27 DIAGNOSIS — Z91041 Radiographic dye allergy status: Secondary | ICD-10-CM | POA: Diagnosis not present

## 2014-07-27 HISTORY — PX: INCISION AND DRAINAGE OF WOUND: SHX1803

## 2014-07-27 LAB — POCT HEMOGLOBIN-HEMACUE: HEMOGLOBIN: 9.4 g/dL — AB (ref 13.0–17.0)

## 2014-07-27 SURGERY — IRRIGATION AND DEBRIDEMENT WOUND
Anesthesia: General | Site: Hand | Laterality: Left

## 2014-07-27 MED ORDER — DIPHENHYDRAMINE HCL 25 MG PO CAPS
ORAL_CAPSULE | ORAL | Status: AC
  Filled 2014-07-27: qty 1

## 2014-07-27 MED ORDER — HYDROMORPHONE HCL 1 MG/ML IJ SOLN
INTRAMUSCULAR | Status: AC
  Filled 2014-07-27: qty 1

## 2014-07-27 MED ORDER — FENTANYL CITRATE 0.05 MG/ML IJ SOLN
INTRAMUSCULAR | Status: AC
  Filled 2014-07-27: qty 6

## 2014-07-27 MED ORDER — OXYCODONE HCL 5 MG PO TABS
5.0000 mg | ORAL_TABLET | Freq: Once | ORAL | Status: AC | PRN
  Administered 2014-07-27: 5 mg via ORAL

## 2014-07-27 MED ORDER — CHLORHEXIDINE GLUCONATE 4 % EX LIQD: 60.0000 mL | Freq: Once | CUTANEOUS | Status: DC

## 2014-07-27 MED ORDER — HYDROMORPHONE HCL 1 MG/ML IJ SOLN
0.2500 mg | INTRAMUSCULAR | Status: DC | PRN
  Administered 2014-07-27 (×4): 0.5 mg via INTRAVENOUS

## 2014-07-27 MED ORDER — PROPOFOL 10 MG/ML IV BOLUS
INTRAVENOUS | Status: DC | PRN
  Administered 2014-07-27: 200 mg via INTRAVENOUS

## 2014-07-27 MED ORDER — LACTATED RINGERS IV SOLN
INTRAVENOUS | Status: DC
  Administered 2014-07-27 (×2): via INTRAVENOUS

## 2014-07-27 MED ORDER — HYDROCODONE-ACETAMINOPHEN 10-325 MG PO TABS: 1.0000 | ORAL_TABLET | Freq: Four times a day (QID) | ORAL | Status: DC | PRN

## 2014-07-27 MED ORDER — OXYCODONE HCL 5 MG PO TABS
ORAL_TABLET | ORAL | Status: AC
  Filled 2014-07-27: qty 1

## 2014-07-27 MED ORDER — MIDAZOLAM HCL 5 MG/5ML IJ SOLN
INTRAMUSCULAR | Status: DC | PRN
Start: 2014-07-27 — End: 2014-07-27
  Administered 2014-07-27: 2 mg via INTRAVENOUS

## 2014-07-27 MED ORDER — DEXAMETHASONE SODIUM PHOSPHATE 4 MG/ML IJ SOLN
INTRAMUSCULAR | Status: DC | PRN
  Administered 2014-07-27: 10 mg via INTRAVENOUS

## 2014-07-27 MED ORDER — LIDOCAINE HCL (CARDIAC) 20 MG/ML IV SOLN
INTRAVENOUS | Status: DC | PRN
  Administered 2014-07-27: 50 mg via INTRAVENOUS

## 2014-07-27 MED ORDER — FENTANYL CITRATE 0.05 MG/ML IJ SOLN
INTRAMUSCULAR | Status: DC | PRN
  Administered 2014-07-27 (×2): 50 ug via INTRAVENOUS
  Administered 2014-07-27: 25 ug via INTRAVENOUS
  Administered 2014-07-27: 50 ug via INTRAVENOUS
  Administered 2014-07-27: 25 ug via INTRAVENOUS

## 2014-07-27 MED ORDER — MIDAZOLAM HCL 2 MG/2ML IJ SOLN
INTRAMUSCULAR | Status: AC
  Filled 2014-07-27: qty 2

## 2014-07-27 MED ORDER — OXYCODONE HCL 5 MG/5ML PO SOLN: 5.0000 mg | Freq: Once | ORAL | Status: AC | PRN

## 2014-07-27 MED ORDER — PROMETHAZINE HCL 25 MG RE SUPP
25.0000 mg | Freq: Four times a day (QID) | RECTAL | Status: DC | PRN
Start: 2014-07-27 — End: 2014-08-08

## 2014-07-27 MED ORDER — DIPHENHYDRAMINE HCL 25 MG PO CAPS: 25.0000 mg | ORAL_CAPSULE | Freq: Once | ORAL | Status: DC | PRN

## 2014-07-27 MED ORDER — 0.9 % SODIUM CHLORIDE (POUR BTL) OPTIME
TOPICAL | Status: DC | PRN
  Administered 2014-07-27: 500 mL

## 2014-07-27 MED ORDER — ONDANSETRON HCL 4 MG/2ML IJ SOLN: 4.0000 mg | Freq: Once | INTRAMUSCULAR | Status: DC | PRN

## 2014-07-27 MED ORDER — PROMETHAZINE HCL 25 MG RE SUPP: 25.0000 mg | Freq: Four times a day (QID) | RECTAL | Status: DC | PRN

## 2014-07-27 SURGICAL SUPPLY — 49 items
BAG DECANTER FOR FLEXI CONT (MISCELLANEOUS) IMPLANT
BLADE MINI RND TIP GREEN BEAV (BLADE) IMPLANT
BLADE SURG 15 STRL LF DISP TIS (BLADE) ×1 IMPLANT
BLADE SURG 15 STRL SS (BLADE) ×3
BNDG CMPR 9X4 STRL LF SNTH (GAUZE/BANDAGES/DRESSINGS)
BNDG COHESIVE 1X5 TAN STRL LF (GAUZE/BANDAGES/DRESSINGS) IMPLANT
BNDG COHESIVE 3X5 TAN STRL LF (GAUZE/BANDAGES/DRESSINGS) ×2 IMPLANT
BNDG ESMARK 4X9 LF (GAUZE/BANDAGES/DRESSINGS) IMPLANT
BNDG GAUZE ELAST 4 BULKY (GAUZE/BANDAGES/DRESSINGS) ×3 IMPLANT
CHLORAPREP W/TINT 26ML (MISCELLANEOUS) IMPLANT
CORDS BIPOLAR (ELECTRODE) ×3 IMPLANT
COVER BACK TABLE 60X90IN (DRAPES) ×3 IMPLANT
COVER MAYO STAND STRL (DRAPES) ×3 IMPLANT
CUFF TOURNIQUET SINGLE 18IN (TOURNIQUET CUFF) ×2 IMPLANT
DRAPE EXTREMITY T 121X128X90 (DRAPE) ×3 IMPLANT
DRAPE SURG 17X23 STRL (DRAPES) ×3 IMPLANT
DRSG KUZMA FLUFF (GAUZE/BANDAGES/DRESSINGS) ×1 IMPLANT
GAUZE IODOFORM PACK 1/2 7832 (GAUZE/BANDAGES/DRESSINGS) ×3 IMPLANT
GAUZE PACKING IODOFORM 1/4X15 (GAUZE/BANDAGES/DRESSINGS) IMPLANT
GAUZE SPONGE 4X4 12PLY STRL (GAUZE/BANDAGES/DRESSINGS) ×3 IMPLANT
GAUZE XEROFORM 1X8 LF (GAUZE/BANDAGES/DRESSINGS) ×1 IMPLANT
GLOVE BIO SURGEON STRL SZ7 (GLOVE) ×2 IMPLANT
GLOVE BIOGEL PI IND STRL 7.0 (GLOVE) ×1 IMPLANT
GLOVE BIOGEL PI IND STRL 8.5 (GLOVE) ×1 IMPLANT
GLOVE BIOGEL PI INDICATOR 7.0 (GLOVE) ×2
GLOVE BIOGEL PI INDICATOR 8.5 (GLOVE) ×2
GLOVE ECLIPSE 6.5 STRL STRAW (GLOVE) ×2 IMPLANT
GLOVE SURG ORTHO 8.0 STRL STRW (GLOVE) ×3 IMPLANT
GOWN STRL REUS W/ TWL LRG LVL3 (GOWN DISPOSABLE) ×1 IMPLANT
GOWN STRL REUS W/TWL LRG LVL3 (GOWN DISPOSABLE) ×3
GOWN STRL REUS W/TWL XL LVL3 (GOWN DISPOSABLE) ×3 IMPLANT
LOOP VESSEL MAXI BLUE (MISCELLANEOUS) IMPLANT
NEEDLE PRECISIONGLIDE 27X1.5 (NEEDLE) ×3 IMPLANT
NS IRRIG 1000ML POUR BTL (IV SOLUTION) ×3 IMPLANT
PACK BASIN DAY SURGERY FS (CUSTOM PROCEDURE TRAY) ×3 IMPLANT
PAD CAST 3X4 CTTN HI CHSV (CAST SUPPLIES) ×1 IMPLANT
PADDING CAST ABS 4INX4YD NS (CAST SUPPLIES)
PADDING CAST ABS COTTON 4X4 ST (CAST SUPPLIES) ×1 IMPLANT
PADDING CAST COTTON 3X4 STRL (CAST SUPPLIES) ×3
SPLINT PLASTER CAST XFAST 3X15 (CAST SUPPLIES) IMPLANT
SPLINT PLASTER XTRA FASTSET 3X (CAST SUPPLIES) ×10
STOCKINETTE 4X48 STRL (DRAPES) ×3 IMPLANT
SWAB COLLECTION DEVICE MRSA (MISCELLANEOUS) ×2 IMPLANT
SYR BULB 3OZ (MISCELLANEOUS) ×3 IMPLANT
SYR CONTROL 10ML LL (SYRINGE) ×3 IMPLANT
TOWEL OR 17X24 6PK STRL BLUE (TOWEL DISPOSABLE) ×4 IMPLANT
TUBE ANAEROBIC SPECIMEN COL (MISCELLANEOUS) ×2 IMPLANT
TUBE FEEDING 5FR 15 INCH (TUBING) IMPLANT
UNDERPAD 30X30 INCONTINENT (UNDERPADS AND DIAPERS) ×3 IMPLANT

## 2014-07-27 NOTE — Anesthesia Procedure Notes (Signed)
Procedure Name: LMA Insertion Date/Time: 07/27/2014 3:59 PM Performed by: Genevieve Norlander L Pre-anesthesia Checklist: Patient identified, Emergency Drugs available, Suction available, Patient being monitored and Timeout performed Patient Re-evaluated:Patient Re-evaluated prior to inductionOxygen Delivery Method: Circle System Utilized Preoxygenation: Pre-oxygenation with 100% oxygen Intubation Type: IV induction Ventilation: Mask ventilation without difficulty LMA: LMA inserted LMA Size: 5.0 Number of attempts: 1 Airway Equipment and Method: Bite block Placement Confirmation: positive ETCO2 Tube secured with: Tape Dental Injury: Teeth and Oropharynx as per pre-operative assessment

## 2014-07-27 NOTE — Op Note (Signed)
Dictation Number [id number]

## 2014-07-27 NOTE — Anesthesia Postprocedure Evaluation (Signed)
  Anesthesia Post-op Note  Patient: Lee Klein  Procedure(s) Performed: Procedure(s): IRRIGATION AND DEBRIDEMENT WOUND (Left)  Patient Location: PACU  Anesthesia Type: General   Level of Consciousness: awake, alert  and oriented  Airway and Oxygen Therapy: Patient Spontanous Breathing  Post-op Pain: moderate  Post-op Assessment: Post-op Vital signs reviewed  Post-op Vital Signs: Reviewed  Last Vitals:  Filed Vitals:   07/27/14 1700  BP: 122/81  Pulse: 89  Temp:   Resp: 10    Complications: No apparent anesthesia complications

## 2014-07-27 NOTE — Anesthesia Preprocedure Evaluation (Addendum)
Anesthesia Evaluation  Patient identified by MRN, date of birth, ID band Patient awake    Reviewed: Allergy & Precautions, NPO status , Patient's Chart, lab work & pertinent test results  Airway Mallampati: I  TM Distance: >3 FB Neck ROM: Full    Dental  (+) Teeth Intact, Dental Advisory Given   Pulmonary former smoker,  breath sounds clear to auscultation        Cardiovascular Rhythm:Regular Rate:Normal     Neuro/Psych    GI/Hepatic GERD-  Medicated and Controlled,  Endo/Other    Renal/GU      Musculoskeletal   Abdominal   Peds  Hematology   Anesthesia Other Findings NPO since 1000 today.  Reproductive/Obstetrics                            Anesthesia Physical Anesthesia Plan  ASA: II  Anesthesia Plan: General   Post-op Pain Management:    Induction: Intravenous  Airway Management Planned: LMA  Additional Equipment:   Intra-op Plan:   Post-operative Plan: Extubation in OR  Informed Consent: I have reviewed the patients History and Physical, chart, labs and discussed the procedure including the risks, benefits and alternatives for the proposed anesthesia with the patient or authorized representative who has indicated his/her understanding and acceptance.   Dental advisory given  Plan Discussed with: CRNA, Anesthesiologist and Surgeon  Anesthesia Plan Comments:         Anesthesia Quick Evaluation

## 2014-07-27 NOTE — H&P (Signed)
[name] is a 64 year-old right-hand dominant male originally referred by Dr. [name] for consultation with respect to swelling of his left dorsal wrist, radial side. This began in May of 2015.  He has seen at least 10 separate physicians and has had multiple injections.  He was given antibiotics when this first began after bowel surgery. He states that it improved, but returned in July.    He has history of Crohn's disease and is on immunosuppression.  Dr. [name] is concerned about the possibility of infection vs. inflammatory processes prior to him starting him on DMARDs.    He has had MRI revealing a scapholunate ligament injury, poor visualization of the ulnar attachment of the triangular fibrocartilage complex, wrist arthroscopy with synovitis.  He underwent arthroscopy of his wrist in an effort to obtain cultures. This came back with mycobacterial infection.  This increased in swelling in February of 2016.  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  He has been in a splint since that time.  He is seen today with an exacerbation of his swelling and development of sinus tracts on the dorsal aspect of his wrist on the radial aspect first web space.  He has decreased mobility, the swelling increased dramatically over the past two days.  He is followed by infectious disease.  He is growing out mycobacterium abscessus.  He has recently been placed on an antibiotic to which it is sensitive.  His wrist has swollen up.  ALLERGIES:  Demerol, IVP dye.  MEDICATIONS:   Imuran, Protonix, vitamins.  SURGICAL HISTORY:  Hernia repair and (7) colon operations.  FAMILY MEDICAL HISTORY:   Positive for arthritis otherwise negative.  SOCIAL HISTORY:  He does not smoke or drink.  He is married.  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    REVIEW OF SYSTEMS:   Positive for glasses, Crohn's disease, otherwise negative 14 points.   Lee Klein is an 64 y.o. male.   Chief Complaint: mycobacterial osteomyelitis left hand HPI: see above  Past Medical History  Diagnosis Date  . Crohn's disease   . Hernia, abdominal     "repaired 10/2013"  . GERD (gastroesophageal reflux disease)   . Anxiety   . 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 Kidney stones     "passed them"    Past Surgical History  Procedure Laterality Date  . Ileostomy  06/2010; 10/2013  . Colonoscopy    . Upper gastrointestinal endoscopy    . Appendectomy  ~ 2009  . Ileostomy closure N/A 10/23/2013    Procedure: ILEOSTOMY TAKEDOWN, REPAIR OF PARASTOMAL HERNIA.;  Surgeon: Cherylynn Ridges, MD;  Location: [facility] OR;  Service: General;  Laterality: N/A;  . Wrist arthroscopy with debridement Left 05/24/2014    Procedure: LEFT ARTHROSCOPY WRIST CULTURE/BIOPSY DEBRIDEMENT;  Surgeon: Cindee Salt, MD;  Location: [facility];  Service: Orthopedics;  Laterality: Left;  . Wrist fracture surgery Right 06/2006    [name] 09/29/2010  . Esophagogastroduodenoscopy N/A 06/15/2014    Procedure: ESOPHAGOGASTRODUODENOSCOPY (EGD);  Surgeon: Malissa Hippo, MD;  Location: [facility];  Service: Endoscopy;  Laterality: N/A;  . Cholecystectomy  ~ 2008  . Tonsillectomy  1963  . Fracture surgery    . Hernia repair  10/2013    parastomal/notes 10/03/2013  . Colon surgery  ~ 2010    for a colonic stricture at the anastomosis from  a colostomy reversal/notes 10/03/2013  . Parastomal hernia repair  10/2013  . Incision and drainage abscess Left 06/22/2014    Procedure: INCISION, DEBRIDEMENT AND IRRIGATION LEFT HAND/WRIST;  Surgeon: Cindee Salt, MD;  Location: [facility];  Service: Orthopedics;  Laterality: Left;    Family History  Problem Relation Age of Onset  . Healthy Mother   . Healthy Daughter   . Healthy Son    Social History:  reports that he quit smoking about 8 years ago. His smoking use included Cigars. He has never used  smokeless tobacco. He reports that he does not drink alcohol or use illicit drugs.  Allergies:  Allergies  Allergen Reactions  . Ivp Dye [Iodinated Diagnostic Agents]     Joint pain, nausea  . Fish Allergy Nausea And Vomiting  . Rifampin Nausea And Vomiting and Other (See Comments)    Chills  . Shellfish Allergy Swelling  . Demerol Rash    No prescriptions prior to admission    No results found for this or any previous visit (from the past 48 hour(s)).  No results found.   Pertinent items are noted in HPI.  There were no vitals taken for this visit.  General appearance: alert, cooperative and appears stated age Head: Normocephalic, without obvious abnormality Neck: no JVD Resp: clear to auscultation bilaterally Cardio: regular rate and rhythm, S1, S2 normal, no murmur, click, rub or gallop GI: soft, non-tender; bowel sounds normal; no masses,  no organomegaly Extremities: swelling left hand with draining sinus tracts Pulses: 2+ and symmetric Skin: Skin color, texture, turgor normal. No rashes or lesions Neurologic: Grossly normal Incision/Wound: Open draining sinuses  Assessment/Plan RADIOGRAPHS:   X-rays reveal continuation of the destruction of his midcarpal joint.  He has a new osteomyelitic abscess in his index metacarpal head of his left hand.  PLAN:   I&D, this will be packed open.  He and his wife are aware that this may well develop into sinus tracts which may take a considerable period of time to close.  This will be scheduled as an outpatient under anesthesia.  Welma Mccombs R 07/27/2014, 1:38 PM

## 2014-07-27 NOTE — Telephone Encounter (Signed)
Patient called and message left on his answering service. Promethazine 12.5 mg to 25 mg by mouth twice a day when necessary; 20 doses of 25 mg each. Levsin sublingual one 4 times a day when necessary 100 doses with one refill. Both prescriptions called to [facility] in [facility]

## 2014-07-27 NOTE — Telephone Encounter (Signed)
[name] called and wants to know if you will call Lee Klein something in for stomach cramps, she said whatever you gave him 2-3 years ago really helped but she couldn't remember the name, she thinks it starts with L, also needs phenergan

## 2014-07-27 NOTE — Discharge Instructions (Addendum)

## 2014-07-27 NOTE — Brief Op Note (Signed)
07/27/2014  4:29 PM  PATIENT:  Lee Klein  64 y.o. male  PRE-OPERATIVE DIAGNOSIS:  i and d L wrist   POST-OPERATIVE DIAGNOSIS:  * No post-op diagnosis entered *  PROCEDURE:  Procedure(s): IRRIGATION AND DEBRIDEMENT WOUND (Left)  SURGEON:  Surgeon(s) and Role:    * Cindee Salt, MD - Primary  PHYSICIAN ASSISTANT:   ASSISTANTS: none   ANESTHESIA:   general  EBL:  Total I/O In: 1000 [I.V.:1000] Out: -   BLOOD ADMINISTERED:none  DRAINS: none   LOCAL MEDICATIONS USED:  NONE  SPECIMEN:  Source of Specimen:  culture  DISPOSITION OF SPECIMEN:  PATHOLOGY  COUNTS:  YES  TOURNIQUET:   Total Tourniquet Time Documented: Upper Arm (Left) - 18 minutes Total: Upper Arm (Left) - 18 minutes   DICTATION: .Other Dictation: Dictation Number [id number]  PLAN OF CARE: Discharge to home after PACU  PATIENT DISPOSITION:  PACU - hemodynamically stable.

## 2014-07-27 NOTE — Transfer of Care (Signed)
Immediate Anesthesia Transfer of Care Note  Patient: Lee Klein  Procedure(s) Performed: Procedure(s): IRRIGATION AND DEBRIDEMENT WOUND (Left)  Patient Location: PACU  Anesthesia Type:General  Level of Consciousness: awake and patient cooperative  Airway & Oxygen Therapy: Patient Spontanous Breathing and Patient connected to face mask oxygen  Post-op Assessment: Report given to RN and Post -op Vital signs reviewed and stable  Post vital signs: Reviewed and stable  Last Vitals:  Filed Vitals:   07/27/14 1509  BP: 144/78  Pulse: 119  Temp: 36.4 C  Resp: 18    Complications: No apparent anesthesia complications

## 2014-07-28 NOTE — Op Note (Signed)
NAMERUEBEN, LINS                ACCOUNT NO.:  [id number]  MEDICAL RECORD NO.:  [id number]  LOCATION:                                 FACILITY:  PHYSICIAN:  Cindee Salt, M.D.       DATE OF BIRTH:  1950/08/05  DATE OF PROCEDURE:  07/27/2014 DATE OF DISCHARGE:                              OPERATIVE REPORT   PREOPERATIVE DIAGNOSIS:  Mycobacterial infection, left wrist, left index metacarpal.  POSTOPERATIVE DIAGNOSIS:  Mycobacterial infection, left wrist, left index metacarpal with osteomyelitis.  OPERATION:  Incision and drainage, packing of index finger metacarpal, left wrist joint.  SURGEON:  Cindee Salt, MD  ANESTHESIA:  General.  ANESTHESIOLOGIST:  Sheldon Silvan, MD  HISTORY:  The patient is a 64 year old male with a long history of swelling of his left hand and wrist. This has been going on for approximately 7 months.  He did not have a diagnosis, was immunosuppressed due to bowel problems, was going to be placed on a DMARD.  Prior to that request was made for a biopsy revealing atypical mycobacteria which has grown out Mycobacterium abscessus.  He has recently been placed on antibiotics to which it is sensitive.  He has undergone an initial arthroscopy with cultures relating the diagnosis, subsequent incision and drainage last month.  He has done well until yesterday when his wrist swelled up again despite being on the antibiotics.  He is admitted now for incision and drainage.  X-rays revealed osteomyelitis of the distal portion of the metacarpal of his index finger which is new along with continuation of swelling and destruction of his carpal bones. In the preoperative area, the patient is seen, the extremity marked by both patient and surgeon.  Antibiotic was not given.  PROCEDURE IN DETAIL:  The patient was brought to the operating room, where a general anesthetic was carried out without difficulty under the direction of Dr. Ivin Booty.  He was prepped using Betadine  scrub and solution with the left arm free.  The limb was elevated for exsanguination and a tourniquet placed on the upper arm was inflated to 250 mmHg.  An incision was then made on the dorsal aspect where the sinus tracts had opened on his wrist carried down through subcutaneous tissue.  The extensor tendons were swept radially and incision then made into the capsule of the carpus.  Cultures were taken in that there was a purulent material.  This was then copiously irrigated after debriding it with a rongeur taking care not to remove any significant amount of bone. A moderate amount of sandy type tissue was removed.  This was left.  A separate incision was then made over the sinus tract on the index metacarpal radial aspect carried down through subcutaneous tissue.  This was followed just dorsal to the first dorsal interosseous.  The metacarpal head was easily able to be opened with scissors allowing access to the metacarpal head.  This was debrided with a curette, copiously irrigated with saline removing any necrotic tissue.  This was then packed with Iodoform down to the bone.  The packing was then placed also into the wrist joint.  Each of the wounds was left open.  A sterile compressive dressing volar splint was applied.  On deflation of the tourniquet, all fingers immediately pinked.  He was taken to the recovery room for observation in satisfactory condition.  He will be discharged home to return on Monday on hydrocodone 10/325.  He is also given a prescription for Phenergan suppositories.          ______________________________ Cindee Salt, M.D.     GK/MEDQ  D:  07/27/2014  T:  07/28/2014  Job:  [id number]

## 2014-07-30 ENCOUNTER — Encounter: Payer: Self-pay | Admitting: Infectious Disease

## 2014-07-30 ENCOUNTER — Telehealth (INDEPENDENT_AMBULATORY_CARE_PROVIDER_SITE_OTHER): Payer: Self-pay | Admitting: *Deleted

## 2014-07-30 ENCOUNTER — Encounter (HOSPITAL_BASED_OUTPATIENT_CLINIC_OR_DEPARTMENT_OTHER): Payer: Self-pay | Admitting: Orthopedic Surgery

## 2014-07-30 ENCOUNTER — Telehealth: Payer: Self-pay | Admitting: *Deleted

## 2014-07-30 DIAGNOSIS — R1013 Epigastric pain: Secondary | ICD-10-CM

## 2014-07-30 LAB — WOUND CULTURE: Culture: NO GROWTH

## 2014-07-30 NOTE — Telephone Encounter (Signed)
[name] would like for Dr. Karilyn Cota to know Gannicus is starting to show signes of withdrawing from Prednisone. Her return phone number is [phone].

## 2014-07-30 NOTE — Telephone Encounter (Signed)
Pt had I & D Friday, March 11.  Cultures done on purulent materials.  Dr. [name] asked that pt be "briefly seen" this wk, post-op.  0845 appt for Wed., March 16 made for the pt.  He is going to "whirlpool" therapy afterward.

## 2014-07-30 NOTE — Telephone Encounter (Signed)
Patient called. [name] answered and states that she is in our parking lot walking into office. Will be addressed in office.

## 2014-07-31 ENCOUNTER — Telehealth: Payer: Self-pay | Admitting: *Deleted

## 2014-07-31 ENCOUNTER — Other Ambulatory Visit (INDEPENDENT_AMBULATORY_CARE_PROVIDER_SITE_OTHER): Payer: Self-pay | Admitting: Internal Medicine

## 2014-07-31 LAB — AFB CULTURE WITH SMEAR (NOT AT ARMC)

## 2014-07-31 MED ORDER — PANTOPRAZOLE SODIUM 40 MG PO TBEC: 40.0000 mg | DELAYED_RELEASE_TABLET | Freq: Two times a day (BID) | ORAL | Status: DC

## 2014-07-31 NOTE — Telephone Encounter (Signed)
Susceptibilites resulted from [place], placed in Dr. Zenaida Niece Dam's box. Patient to follow up 3/16.  Andree Coss, RN

## 2014-07-31 NOTE — Telephone Encounter (Signed)
Refill on Protonix 

## 2014-08-01 ENCOUNTER — Ambulatory Visit (INDEPENDENT_AMBULATORY_CARE_PROVIDER_SITE_OTHER): Payer: Managed Care, Other (non HMO) | Admitting: Infectious Disease

## 2014-08-01 ENCOUNTER — Encounter: Payer: Self-pay | Admitting: Infectious Disease

## 2014-08-01 VITALS — BP 139/81 | HR 111 | Temp 97.5°F | Wt 163.0 lb

## 2014-08-01 DIAGNOSIS — M869 Osteomyelitis, unspecified: Secondary | ICD-10-CM

## 2014-08-01 DIAGNOSIS — M009 Pyogenic arthritis, unspecified: Secondary | ICD-10-CM

## 2014-08-01 DIAGNOSIS — A319 Mycobacterial infection, unspecified: Secondary | ICD-10-CM

## 2014-08-01 DIAGNOSIS — E2740 Unspecified adrenocortical insufficiency: Secondary | ICD-10-CM

## 2014-08-01 LAB — ANAEROBIC CULTURE

## 2014-08-01 NOTE — Progress Notes (Signed)
Subjective:    Patient ID: Lee Klein, male    DOB: Feb 05, 1951, 64 y.o.   MRN: [id number]  HPI   64 y.o. 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 [name] in January of this year.  He was admitted to the hospital and we started patient on IV cefoxitin high dose, BID clarithro and BID zyvox.  He had incision and drainage of wrist with thorough debridementincluding distal radioulnar joint, incision and drainage of palm withdrainage of 2 abscesses, one on the index, one on the ring finger on February 5th, 2016.  Since he left the hospital he is having a VERY hard time tolerating his abx with metallic taste in mouth, poor appetite nausea and feeling of burning down his throat.  He had sensitivities sent off to [facility] as well as to [facility] and they're listed below.      We switched him over to intravenous tigecycline along with 3 times weekly IV amikacin and continued azithromycin.  His left hand swelled up further and he required further incision and debridement by Dr. [name]. Articular found what appear to be infected bone consistent with MRI findings but did not remove actual bone but scraped unhealthy material from around this and sent this for bacterial cultures which were negative.  In the interim the patient also had continued to feel poorly and his gastroneurologist and suggested that he might be suffering from adrenal insufficiency. Apparently the patient been on prednisone for 39 years and had been down on 5 mg of prednisone when seen in the hospital but was taken off of it "cold [place]". He is placed back on prednisone at 10 mg and now 5 mg a day and is improved much and this with much less nausea malaise and fatigue. His appetite also has returned. This despite the fact he is also getting tigecycline and azithromycin.  He is see Dr. [name] today  for follow-up.     Review of Systems  Constitutional: Negative for fever, chills and diaphoresis.  HENT: Negative for congestion, rhinorrhea, sinus pressure, sneezing and sore throat.   Eyes: Negative for photophobia and visual disturbance.  Respiratory: Negative for cough, chest tightness, shortness of breath, wheezing and stridor.   Cardiovascular: Negative for chest pain, palpitations and leg swelling.  Gastrointestinal: Negative for vomiting, abdominal pain, diarrhea, constipation, blood in stool, abdominal distention and anal bleeding.  Genitourinary: Negative for dysuria, hematuria, flank pain and difficulty urinating.  Musculoskeletal: Negative for myalgias, back pain, joint swelling, arthralgias and gait problem.  Skin: Negative for color change, pallor, rash and wound.  Neurological: Negative for dizziness, weakness and light-headedness.  Hematological: Negative for adenopathy. Does not bruise/bleed easily.  Psychiatric/Behavioral: Negative for behavioral problems, confusion, sleep disturbance, decreased concentration and agitation.       Objective:   Physical Exam  Constitutional: He is oriented to person, place, and time. He appears well-nourished.  HENT:  Head: Normocephalic and atraumatic.  Mouth/Throat: Oropharynx is clear and moist. No oropharyngeal exudate.  Eyes: Conjunctivae and EOM are normal.  Neck: Normal range of motion. Neck supple.  Cardiovascular: Normal rate and regular rhythm.   Pulmonary/Chest: Effort normal. No respiratory distress. He has no wheezes.  Abdominal: Soft. He exhibits no distension.  Musculoskeletal: Normal range of motion. He exhibits no edema or tenderness.       Arms: Neurological: He is alert and oriented to person, place, and time.  Skin: Skin is warm and dry. No rash noted. No erythema. No pallor.  Psychiatric: He has a normal mood and affect.          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  But Lee Klein and I discussed further and we preferred to keep him on TIW amikacin (to reduce nephrotoxicity risk), daily azithromycin and BID Tygacil since ALL of these agents are active  I have some anxiety that his M abscessus could have had chance to develop R to macrolide since he has only now been on THREE fully active drugs.   IF he has further surgery would encourage the cultures to be sent for AFB cultures and see if the M abscesses itself is still growing and if so send percent ability testing once again.  He VERY LIKELY will need repeated surgeries and protracted antibiotics on order of 12 months plus  I spent greater than 40 minutes with the patient including greater than 50% of time in face to face counsel of the patient and in coordination of their care.  Adrenal insufficiency: Apparent adrenal insufficiency based on conical history on steroids and knee feels much better I would like to have him tapered off carefully and Lee Klein has worked on a regimen for the patient.

## 2014-08-01 NOTE — Progress Notes (Signed)
HPI: Lee Klein is a 64 y.o. male who presents to the [facility] today for follow-up of his Mycobacterium abscessus hand infection.  Allergies: Allergies  Allergen Reactions  . Ivp Dye [Iodinated Diagnostic Agents]     Joint pain, nausea  . Fish Allergy Nausea And Vomiting  . Rifampin Nausea And Vomiting and Other (See Comments)    Chills  . Shellfish Allergy Swelling  . Demerol Rash    Vitals: Temp: 97.5 F (36.4 C) (03/16 0845) Temp Source: Oral (03/16 0845) BP: 139/81 mmHg (03/16 0845) Pulse Rate: 111 (03/16 0845)  Past Medical History: Past Medical History  Diagnosis Date  . Crohn's disease   . Hernia, abdominal     "repaired 10/2013"  . GERD (gastroesophageal reflux disease)   . Anxiety   . 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 Kidney stones     "passed them"    Social History: History   Social History  . Marital Status: Married    Spouse Name: N/A  . Number of Children: N/A  . Years of Education: N/A   Social History Main Topics  . Smoking status: Former Smoker -- 30 years    Types: Cigars    Quit date: 05/31/2006  . Smokeless tobacco: Never Used  . Alcohol Use: No  . Drug Use: No  . Sexual Activity:    Partners: Female    Copy: None   Other Topics Concern  . None   Social History Narrative   CrCl: Estimated Creatinine Clearance: 60.7 mL/min (by C-G formula based on Cr of 1.32).  Lipids: No results found for: CHOL, TRIG, HDL, CHOLHDL, VLDL, LDLCALC  Assessment: 64 yo m who presents to the [facility] today for follow-up of his M. abscessus hand infection.  I saw Lee Klein with Dr. Daiva Eves today.  He is doing ok with his hand infection.  He recently had another I&D on Friday, 3/11.  He has been on chronic steroids for almost 40 years due to his Crohn's Disease.  He recently was taken off of steroids and suffered what seems like adrenal suppression with associated nausea, vomiting, weakness, skin peeling, and  loss of appetite. He will need to be tapered off of his steroids slowly due to his long history of taking them.  He does need to be off of steroids as it will not help the infection in his hand if he continues on them.  He will need a prolonged tapering.  Spoke with [name] and Dr. Daiva Eves and will do prednisone 5 mg every other day for 3 weeks followed by 2.5 mg every other day for 3 weeks.  Hopefully this will allow his adrenal function to slowly improve on the taper, and he won't have any withdrawal after coming off of them.   Recommendations: Prednisone 5 mg every other day for 3 weeks Followed by prednisone 2.5 mg every other day for 3 weeks  Katey Barrie L. Roseanne Reno, PharmD Clinical Infectious Disease Pharmacist [facility] 08/01/2014, 10:06 AM

## 2014-08-02 ENCOUNTER — Other Ambulatory Visit (INDEPENDENT_AMBULATORY_CARE_PROVIDER_SITE_OTHER): Payer: Self-pay | Admitting: Internal Medicine

## 2014-08-02 DIAGNOSIS — R1013 Epigastric pain: Secondary | ICD-10-CM

## 2014-08-02 MED ORDER — PANTOPRAZOLE SODIUM 40 MG PO TBEC: 40.0000 mg | DELAYED_RELEASE_TABLET | Freq: Two times a day (BID) | ORAL | Status: DC

## 2014-08-02 NOTE — Telephone Encounter (Signed)
Rx for Protonix sent to [facility]

## 2014-08-07 ENCOUNTER — Ambulatory Visit: Payer: Managed Care, Other (non HMO) | Admitting: Infectious Disease

## 2014-08-08 ENCOUNTER — Encounter (HOSPITAL_BASED_OUTPATIENT_CLINIC_OR_DEPARTMENT_OTHER): Payer: Self-pay | Admitting: *Deleted

## 2014-08-08 ENCOUNTER — Other Ambulatory Visit: Payer: Self-pay | Admitting: Orthopedic Surgery

## 2014-08-09 ENCOUNTER — Ambulatory Visit (HOSPITAL_BASED_OUTPATIENT_CLINIC_OR_DEPARTMENT_OTHER): Payer: Managed Care, Other (non HMO) | Admitting: Anesthesiology

## 2014-08-09 ENCOUNTER — Encounter (HOSPITAL_BASED_OUTPATIENT_CLINIC_OR_DEPARTMENT_OTHER): Payer: Self-pay | Admitting: Anesthesiology

## 2014-08-09 ENCOUNTER — Ambulatory Visit (HOSPITAL_BASED_OUTPATIENT_CLINIC_OR_DEPARTMENT_OTHER)
Admission: RE | Admit: 2014-08-09 | Discharge: 2014-08-09 | Disposition: A | Discharge status: Home / Self Care | Payer: Managed Care, Other (non HMO) | Source: Ambulatory Visit | Attending: Orthopedic Surgery | Admitting: Orthopedic Surgery

## 2014-08-09 ENCOUNTER — Encounter (HOSPITAL_BASED_OUTPATIENT_CLINIC_OR_DEPARTMENT_OTHER)
Admission: RE | Disposition: A | Discharge status: Home / Self Care | Payer: Self-pay | Source: Ambulatory Visit | Attending: Orthopedic Surgery

## 2014-08-09 DIAGNOSIS — Z7982 Long term (current) use of aspirin: Secondary | ICD-10-CM | POA: Insufficient documentation

## 2014-08-09 DIAGNOSIS — Z79899 Other long term (current) drug therapy: Secondary | ICD-10-CM | POA: Diagnosis not present

## 2014-08-09 DIAGNOSIS — Z87891 Personal history of nicotine dependence: Secondary | ICD-10-CM | POA: Insufficient documentation

## 2014-08-09 DIAGNOSIS — Z792 Long term (current) use of antibiotics: Secondary | ICD-10-CM | POA: Diagnosis not present

## 2014-08-09 DIAGNOSIS — K219 Gastro-esophageal reflux disease without esophagitis: Secondary | ICD-10-CM | POA: Insufficient documentation

## 2014-08-09 DIAGNOSIS — F419 Anxiety disorder, unspecified: Secondary | ICD-10-CM | POA: Diagnosis not present

## 2014-08-09 DIAGNOSIS — Z9889 Other specified postprocedural states: Secondary | ICD-10-CM | POA: Insufficient documentation

## 2014-08-09 DIAGNOSIS — M868X4 Other osteomyelitis, hand: Secondary | ICD-10-CM | POA: Diagnosis not present

## 2014-08-09 DIAGNOSIS — M7989 Other specified soft tissue disorders: Secondary | ICD-10-CM | POA: Diagnosis present

## 2014-08-09 DIAGNOSIS — M868X3 Other osteomyelitis, forearm: Secondary | ICD-10-CM | POA: Insufficient documentation

## 2014-08-09 DIAGNOSIS — L02414 Cutaneous abscess of left upper limb: Secondary | ICD-10-CM | POA: Diagnosis not present

## 2014-08-09 DIAGNOSIS — L02512 Cutaneous abscess of left hand: Secondary | ICD-10-CM | POA: Insufficient documentation

## 2014-08-09 DIAGNOSIS — Z79891 Long term (current) use of opiate analgesic: Secondary | ICD-10-CM | POA: Insufficient documentation

## 2014-08-09 DIAGNOSIS — Z7951 Long term (current) use of inhaled steroids: Secondary | ICD-10-CM | POA: Insufficient documentation

## 2014-08-09 DIAGNOSIS — A319 Mycobacterial infection, unspecified: Secondary | ICD-10-CM | POA: Insufficient documentation

## 2014-08-09 HISTORY — PX: INCISION AND DRAINAGE ABSCESS: SHX5864

## 2014-08-09 SURGERY — INCISION AND DRAINAGE, ABSCESS
Anesthesia: General | Site: Wrist | Laterality: Left

## 2014-08-09 MED ORDER — CHLORHEXIDINE GLUCONATE 4 % EX LIQD: 60.0000 mL | Freq: Once | CUTANEOUS | Status: DC

## 2014-08-09 MED ORDER — CEFAZOLIN SODIUM-DEXTROSE 2-3 GM-% IV SOLR: 2.0000 g | INTRAVENOUS | Status: DC

## 2014-08-09 MED ORDER — FENTANYL CITRATE 0.05 MG/ML IJ SOLN
INTRAMUSCULAR | Status: DC | PRN
Start: 2014-08-09 — End: 2014-08-09
  Administered 2014-08-09 (×3): 50 ug via INTRAVENOUS

## 2014-08-09 MED ORDER — FENTANYL CITRATE 0.05 MG/ML IJ SOLN
INTRAMUSCULAR | Status: AC
  Filled 2014-08-09: qty 6

## 2014-08-09 MED ORDER — BUPIVACAINE HCL (PF) 0.25 % IJ SOLN
INTRAMUSCULAR | Status: AC
  Filled 2014-08-09: qty 30

## 2014-08-09 MED ORDER — LIDOCAINE HCL (CARDIAC) 20 MG/ML IV SOLN
INTRAVENOUS | Status: DC | PRN
  Administered 2014-08-09: 50 mg via INTRAVENOUS

## 2014-08-09 MED ORDER — OXYCODONE-ACETAMINOPHEN 10-325 MG PO TABS: 1.0000 | ORAL_TABLET | ORAL | Status: DC | PRN

## 2014-08-09 MED ORDER — LACTATED RINGERS IV SOLN
INTRAVENOUS | Status: DC | PRN
  Administered 2014-08-09: 13:00:00 via INTRAVENOUS

## 2014-08-09 MED ORDER — PROPOFOL 10 MG/ML IV BOLUS
INTRAVENOUS | Status: DC | PRN
  Administered 2014-08-09: 200 mg via INTRAVENOUS

## 2014-08-09 MED ORDER — HYDROMORPHONE HCL 1 MG/ML IJ SOLN: 0.2500 mg | INTRAMUSCULAR | Status: DC | PRN

## 2014-08-09 MED ORDER — MIDAZOLAM HCL 5 MG/5ML IJ SOLN
INTRAMUSCULAR | Status: DC | PRN
  Administered 2014-08-09: 1 mg via INTRAVENOUS

## 2014-08-09 MED ORDER — ONDANSETRON HCL 4 MG/2ML IJ SOLN: 4.0000 mg | Freq: Once | INTRAMUSCULAR | Status: DC | PRN

## 2014-08-09 MED ORDER — MIDAZOLAM HCL 2 MG/2ML IJ SOLN
INTRAMUSCULAR | Status: AC
  Filled 2014-08-09: qty 2

## 2014-08-09 MED ORDER — DEXAMETHASONE SODIUM PHOSPHATE 10 MG/ML IJ SOLN
INTRAMUSCULAR | Status: DC | PRN
  Administered 2014-08-09: 10 mg via INTRAVENOUS

## 2014-08-09 MED ORDER — CHLORHEXIDINE GLUCONATE 4 % EX LIQD
60.0000 mL | Freq: Once | CUTANEOUS | Status: DC
Start: 2014-08-09 — End: 2014-08-09

## 2014-08-09 MED ORDER — BUPIVACAINE HCL (PF) 0.25 % IJ SOLN
INTRAMUSCULAR | Status: DC | PRN
  Administered 2014-08-09: 6 mL

## 2014-08-09 MED ORDER — OXYCODONE HCL 5 MG/5ML PO SOLN: 5.0000 mg | Freq: Once | ORAL | Status: DC | PRN

## 2014-08-09 MED ORDER — OXYCODONE HCL 5 MG PO TABS: 5.0000 mg | ORAL_TABLET | Freq: Once | ORAL | Status: DC | PRN

## 2014-08-09 SURGICAL SUPPLY — 45 items
BAG DECANTER FOR FLEXI CONT (MISCELLANEOUS) IMPLANT
BLADE MINI RND TIP GREEN BEAV (BLADE) IMPLANT
BLADE SURG 15 STRL LF DISP TIS (BLADE) ×1 IMPLANT
BLADE SURG 15 STRL SS (BLADE) ×3
BNDG CMPR 9X4 STRL LF SNTH (GAUZE/BANDAGES/DRESSINGS)
BNDG COHESIVE 1X5 TAN STRL LF (GAUZE/BANDAGES/DRESSINGS) IMPLANT
BNDG COHESIVE 3X5 TAN STRL LF (GAUZE/BANDAGES/DRESSINGS) IMPLANT
BNDG ESMARK 4X9 LF (GAUZE/BANDAGES/DRESSINGS) IMPLANT
BNDG GAUZE ELAST 4 BULKY (GAUZE/BANDAGES/DRESSINGS) IMPLANT
CHLORAPREP W/TINT 26ML (MISCELLANEOUS) ×3 IMPLANT
CORDS BIPOLAR (ELECTRODE) ×3 IMPLANT
COVER BACK TABLE 60X90IN (DRAPES) ×3 IMPLANT
COVER MAYO STAND STRL (DRAPES) ×3 IMPLANT
CUFF TOURNIQUET SINGLE 18IN (TOURNIQUET CUFF) IMPLANT
DRAPE EXTREMITY T 121X128X90 (DRAPE) ×3 IMPLANT
DRAPE SURG 17X23 STRL (DRAPES) ×3 IMPLANT
DRSG KUZMA FLUFF (GAUZE/BANDAGES/DRESSINGS) ×3 IMPLANT
GAUZE PACKING IODOFORM 1/4X15 (GAUZE/BANDAGES/DRESSINGS) IMPLANT
GAUZE SPONGE 4X4 12PLY STRL (GAUZE/BANDAGES/DRESSINGS) ×3 IMPLANT
GAUZE XEROFORM 1X8 LF (GAUZE/BANDAGES/DRESSINGS) ×3 IMPLANT
GLOVE BIOGEL PI IND STRL 8.5 (GLOVE) ×1 IMPLANT
GLOVE BIOGEL PI INDICATOR 8.5 (GLOVE) ×2
GLOVE SURG ORTHO 8.0 STRL STRW (GLOVE) ×3 IMPLANT
GOWN STRL REUS W/ TWL LRG LVL3 (GOWN DISPOSABLE) ×1 IMPLANT
GOWN STRL REUS W/TWL LRG LVL3 (GOWN DISPOSABLE) ×3
GOWN STRL REUS W/TWL XL LVL3 (GOWN DISPOSABLE) ×3 IMPLANT
LOOP VESSEL MAXI BLUE (MISCELLANEOUS) IMPLANT
NDL PRECISIONGLIDE 27X1.5 (NEEDLE) IMPLANT
NEEDLE PRECISIONGLIDE 27X1.5 (NEEDLE) IMPLANT
NS IRRIG 1000ML POUR BTL (IV SOLUTION) ×3 IMPLANT
PACK BASIN DAY SURGERY FS (CUSTOM PROCEDURE TRAY) ×3 IMPLANT
PAD CAST 3X4 CTTN HI CHSV (CAST SUPPLIES) IMPLANT
PADDING CAST ABS 4INX4YD NS (CAST SUPPLIES) ×2
PADDING CAST ABS COTTON 4X4 ST (CAST SUPPLIES) ×1 IMPLANT
PADDING CAST COTTON 3X4 STRL (CAST SUPPLIES)
SPLINT PLASTER CAST XFAST 3X15 (CAST SUPPLIES) IMPLANT
SPLINT PLASTER XTRA FASTSET 3X (CAST SUPPLIES)
STOCKINETTE 4X48 STRL (DRAPES) ×3 IMPLANT
SWAB COLLECTION DEVICE MRSA (MISCELLANEOUS) IMPLANT
SYR BULB 3OZ (MISCELLANEOUS) ×3 IMPLANT
SYR CONTROL 10ML LL (SYRINGE) IMPLANT
TOWEL OR 17X24 6PK STRL BLUE (TOWEL DISPOSABLE) ×3 IMPLANT
TUBE ANAEROBIC SPECIMEN COL (MISCELLANEOUS) IMPLANT
TUBE FEEDING 5FR 15 INCH (TUBING) IMPLANT
UNDERPAD 30X30 INCONTINENT (UNDERPADS AND DIAPERS) ×3 IMPLANT

## 2014-08-09 NOTE — Brief Op Note (Signed)
08/09/2014  1:59 PM  PATIENT:  Lee Klein  64 y.o. male  PRE-OPERATIVE DIAGNOSIS:  INFECTION LEFT WRIST  POST-OPERATIVE DIAGNOSIS:  infection left wrist  PROCEDURE:  Procedure(s): INCISION AND DRAINAGE LEFT WRIST (Left)  SURGEON:  Surgeon(s) and Role:    * Cindee Salt, MD - Primary  PHYSICIAN ASSISTANT:   ASSISTANTS: none   ANESTHESIA:   local and general  EBL:  Total I/O In: 500 [I.V.:500] Out: -   BLOOD ADMINISTERED:none  DRAINS: none   LOCAL MEDICATIONS USED:  BUPIVICAINE   SPECIMEN:  Source of Specimen:  culture  DISPOSITION OF SPECIMEN:  PATHOLOGY  COUNTS:  YES  TOURNIQUET:   Total Tourniquet Time Documented: Upper Arm (Left) - 18 minutes Total: Upper Arm (Left) - 18 minutes   DICTATION: .Other Dictation: Dictation Number [id number]  PLAN OF CARE: Discharge to home after PACU  PATIENT DISPOSITION:  PACU - hemodynamically stable.

## 2014-08-09 NOTE — Anesthesia Procedure Notes (Signed)
Procedure Name: LMA Insertion Date/Time: 08/09/2014 1:23 PM Performed by: Caren Macadam Pre-anesthesia Checklist: Patient identified, Emergency Drugs available, Suction available and Patient being monitored Patient Re-evaluated:Patient Re-evaluated prior to inductionOxygen Delivery Method: Circle System Utilized Preoxygenation: Pre-oxygenation with 100% oxygen Intubation Type: IV induction Ventilation: Mask ventilation without difficulty LMA: LMA inserted LMA Size: 5.0 Number of attempts: 1 Airway Equipment and Method: Bite block Placement Confirmation: positive ETCO2 and breath sounds checked- equal and bilateral Tube secured with: Tape Dental Injury: Teeth and Oropharynx as per pre-operative assessment

## 2014-08-09 NOTE — Anesthesia Preprocedure Evaluation (Signed)
Anesthesia Evaluation  Patient identified by MRN, date of birth, ID band Patient awake    Reviewed: Allergy & Precautions, NPO status , Patient's Chart, lab work & pertinent test results  Airway Mallampati: I  TM Distance: >3 FB Neck ROM: Full    Dental  (+) Teeth Intact, Dental Advisory Given   Pulmonary former smoker,    breath sounds clear to auscultation       Cardiovascular  Rhythm:Regular Rate:Normal     Neuro/Psych    GI/Hepatic GERD  Medicated and Controlled,  Endo/Other    Renal/GU      Musculoskeletal   Abdominal   Peds  Hematology   Anesthesia Other Findings   Reproductive/Obstetrics                             Anesthesia Physical Anesthesia Plan  ASA: III  Anesthesia Plan: General   Post-op Pain Management:    Induction: Intravenous  Airway Management Planned: LMA  Additional Equipment:   Intra-op Plan:   Post-operative Plan: Extubation in OR  Informed Consent: I have reviewed the patients History and Physical, chart, labs and discussed the procedure including the risks, benefits and alternatives for the proposed anesthesia with the patient or authorized representative who has indicated his/her understanding and acceptance.   Dental advisory given  Plan Discussed with: CRNA, Anesthesiologist and Surgeon  Anesthesia Plan Comments:         Anesthesia Quick Evaluation  

## 2014-08-09 NOTE — Discharge Instructions (Addendum)

## 2014-08-09 NOTE — Anesthesia Postprocedure Evaluation (Signed)
  Anesthesia Post-op Note  Patient: Lee Klein  Procedure(s) Performed: Procedure(s): INCISION AND DRAINAGE LEFT WRIST (Left)  Patient Location: PACU  Anesthesia Type: General   Level of Consciousness: awake, alert  and oriented  Airway and Oxygen Therapy: Patient Spontanous Breathing  Post-op Pain: none  Post-op Assessment: Post-op Vital signs reviewed  Post-op Vital Signs: Reviewed  Last Vitals:  Filed Vitals:   08/09/14 1430  BP: 136/71  Pulse: 75  Temp:   Resp: 11    Complications: No apparent anesthesia complications

## 2014-08-09 NOTE — H&P (Signed)
Lee Klein returns with an  area of swelling on the ulnar aspect of his wrist continues to enlarge. We would recommend I&D of this.  X-rays reveal punched out lesions along the entire area.  ALLERGIES:  Demerol, IVP dye.  MEDICATIONS:   Imuran, Protonix, vitamins.  SURGICAL HISTORY:  Hernia repair and (7) colon operations.  FAMILY MEDICAL HISTORY:   Positive for arthritis otherwise negative.  SOCIAL HISTORY:  He does not smoke or drink.  He is married.  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    REVIEW OF SYSTEMS:   Positive for glasses, Crohn's disease, otherwise negative 14 points.   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  He has a new osteomyelitic abscess in his index metacarpal head of his left hand. Lee Klein is an 64 y.o. male.   Chief Complaint: Mycobacterium Abcessus infection  HPI: see above  Past Medical History  Diagnosis Date  . Crohn's disease   . Hernia, abdominal     "repaired 10/2013"  . GERD (gastroesophageal reflux disease)   . Anxiety   . 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 Kidney stones     "passed them"    Past Surgical History  Procedure Laterality Date  . Ileostomy  06/2010; 10/2013  . Colonoscopy    . Upper gastrointestinal endoscopy    . Appendectomy  ~ 2009  . Ileostomy closure N/A 10/23/2013    Procedure: ILEOSTOMY TAKEDOWN, REPAIR OF PARASTOMAL HERNIA.;  Surgeon: Lee Ridges, MD;  Location: [facility];  Service: General;  Laterality: N/A;  . Wrist arthroscopy with debridement Left 05/24/2014    Procedure: LEFT ARTHROSCOPY WRIST CULTURE/BIOPSY DEBRIDEMENT;  Surgeon: Lee Salt, MD;  Location: [facility];  Service: Orthopedics;  Laterality: Left;  . Wrist fracture surgery Right 06/2006    Lee Klein 09/29/2010  . Esophagogastroduodenoscopy N/A 06/15/2014    Procedure: ESOPHAGOGASTRODUODENOSCOPY (EGD);  Surgeon: Lee Hippo, MD;  Location: [facility];  Service:  Endoscopy;  Laterality: N/A;  . Cholecystectomy  ~ 2008  . Tonsillectomy  1963  . Fracture surgery    . Hernia repair  10/2013    parastomal/notes 10/03/2013  . Colon surgery  ~ 2010    for a colonic stricture at the anastomosis from a colostomy reversal/notes 10/03/2013  . Parastomal hernia repair  10/2013  . Incision and drainage abscess Left 06/22/2014    Procedure: INCISION, DEBRIDEMENT AND IRRIGATION LEFT HAND/WRIST;  Surgeon: Lee Salt, MD;  Location: [facility];  Service: Orthopedics;  Laterality: Left;  . Incision and drainage of wound Left 07/27/2014    Procedure: IRRIGATION AND DEBRIDEMENT WOUND;  Surgeon: Lee Salt, MD;  Location: [facility];  Service: Orthopedics;  Laterality: Left;    Family History  Problem Relation Age of Onset  . Healthy Mother   . Healthy Daughter   . Healthy Son    Social History:  reports that he quit smoking about 8 years ago. His smoking use included Cigars. He has never used smokeless tobacco. He reports that he does not drink alcohol or use illicit drugs.  Allergies:  Allergies  Allergen Reactions  . Ivp Dye [Iodinated Diagnostic Agents]     Joint pain, nausea  . Fish Allergy Nausea And Vomiting  . Rifampin Nausea And Vomiting and Other (See Comments)    Chills  . Shellfish Allergy Swelling  . Demerol Rash    Medications Prior  to Admission  Medication Sig Dispense Refill  . acetaminophen (TYLENOL) 325 MG tablet Take 650 mg by mouth every 6 (six) hours as needed (pain).    Marland Kitchen ALPRAZolam (XANAX) 0.5 MG tablet TAKE 1 TABLET BY MOUTH TWO TIMES DAILY AS NEEDED FOR ANXIETY 60 tablet 2  . Ascorbic Acid (VITAMIN C) 1000 MG tablet Take 1,000 mg by mouth daily.    Marland Kitchen aspirin EC 81 MG tablet Take 1 tablet (81 mg total) by mouth daily. (Patient not taking: Reported on 07/03/2014)    . azithromycin (ZITHROMAX) 600 MG tablet Take 1 tablet (600 mg total) by mouth daily. (Patient taking differently: Take 600 mg by mouth every  evening. TAKES AT 8PM WITH ZYVOX) 30 tablet 11  . Calcium Carbonate-Vitamin D (CALCIUM 600 + D PO) Take 1 tablet by mouth daily.     . cefOXitin 12 g in sodium chloride 0.9 % 250 mL AT LEAST THROUGH 07/20/14, LIKELY LONGER, DO NOT STOP WITHOUT DISCUSSING WITH DR. VANDAMM 31 each 0  . feeding supplement, ENSURE COMPLETE, (ENSURE COMPLETE) LIQD Take 237 mLs by mouth 2 (two) times daily between meals.    . fluconazole (DIFLUCAN) 100 MG tablet Take 1 tablet (100 mg total) by mouth daily. (Patient taking differently: Take 100 mg by mouth daily. TAKES AT MIDNIGHT WITH 6 DOSES CURRENTLY REMIANING--TAKES SEPARATE FROM ZITHROMAX AND ZYVOX) 14 tablet 2  . fluticasone (FLONASE) 50 MCG/ACT nasal spray Place 2 sprays into both nostrils daily as needed for allergies or rhinitis.   2  . HYDROcodone-acetaminophen (NORCO) 10-325 MG per tablet Take 1 tablet by mouth every 6 (six) hours as needed. 30 tablet 0  . loperamide (IMODIUM A-D) 2 MG tablet Take 8 mg by mouth 2 (two) times daily.     . Multiple Vitamins-Minerals (MULTIVITAMIN PO) Take 1 tablet by mouth daily.     Marland Kitchen nystatin (MYCOSTATIN) 100000 UNIT/ML suspension Take 5 mLs (500,000 Units total) by mouth 4 (four) times daily. 60 mL 0  . nystatin (MYCOSTATIN/NYSTOP) 100000 UNIT/GM POWD Apply topically daily as needed (FOR IRRITATION). PRN  1  . ondansetron (ZOFRAN) 4 MG tablet Take 1 tablet (4 mg total) by mouth 2 (two) times daily as needed for nausea or vomiting. (Patient not taking: Reported on 07/12/2014) 30 tablet 0  . pantoprazole (PROTONIX) 40 MG tablet Take 1 tablet (40 mg total) by mouth 2 (two) times daily before a meal. 180 tablet 4  . promethazine (PHENERGAN) 25 MG suppository Place 1 suppository (25 mg total) rectally every 6 (six) hours as needed for nausea or vomiting. 24 each 0  . Tigecycline (TYGACIL IV) Inject 50 mg into the vein 2 (two) times daily.    . Zinc 50 MG CAPS Take 1 capsule by mouth daily.       No results found for this or any  previous visit (from the past 48 hour(s)).  No results found.   Pertinent items are noted in HPI.  Height  (1.778 m), weight 73.936 kg (163 lb).  General appearance: alert, cooperative and appears stated age Head: Normocephalic, without obvious abnormality Neck: no JVD Resp: clear to auscultation bilaterally Cardio: regular rate and rhythm, S1, S2 normal, no murmur, click, rub or gallop GI: soft, non-tender; bowel sounds normal; no masses,  no organomegaly Extremities: infection left wrist Pulses: 2+ and symmetric Skin: open abcess left Neurologic: Grossly normal Incision/Wound: Open dorsum wrist  Assessment/Plan I&D new abcess  Lee Klein R 08/09/2014, 11:53 AM

## 2014-08-09 NOTE — Op Note (Signed)
Dictation Number [id number]

## 2014-08-09 NOTE — Transfer of Care (Signed)
Immediate Anesthesia Transfer of Care Note  Patient: Lee Klein  Procedure(s) Performed: Procedure(s): INCISION AND DRAINAGE LEFT WRIST (Left)  Patient Location: PACU  Anesthesia Type:General  Level of Consciousness: awake  Airway & Oxygen Therapy: Patient Spontanous Breathing and Patient connected to face mask oxygen  Post-op Assessment: Report given to RN and Post -op Vital signs reviewed and stable  Post vital signs: Reviewed and stable  Last Vitals:  Filed Vitals:   08/09/14 1226  BP: 129/59  Pulse: 109  Temp: 36.6 C  Resp: 20    Complications: No apparent anesthesia complications

## 2014-08-10 NOTE — Op Note (Signed)
Lee Klein, Lee Klein                ACCOUNT NO.:  [id number]  MEDICAL RECORD NO.:  [id number]  LOCATION:                                 FACILITY:  PHYSICIAN:  Cindee Salt, M.D.       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  OPERATION:  Incision and drainage, left wrist with debridement of multiple open areas.  SURGEON:  Cindee Salt, MD  ANESTHESIA:  General.  ANESTHESIOLOGIST:  Sheldon Silvan, MD.  HISTORY:  The patient is a 64 year old male with history of Crohn disease.  He underwent a bowel procedure, developed pain and swelling of his left wrist approximately 9 months ago.  A diagnosis of infection was not made until recently when he was going to be placed on Enbrel.  Dr. Lendon Colonel requested a confirmation that this was not an infectious process prior to starting it.  An arthroscopy was done revealing atypical mycobacteria.  Incision and drainage was performed with further cultures.  He has since been placed on appropriate antimicrobials.  Once cultures and sensitivities were obtained, he has had at least 3 prior debridements.  He has developed a new draining abscess on the lateral aspect of his left wrist which is new.  He is admitted for incision and drainage of this.  He is aware of risks and complications of surgery that it will be packed open.  PROCEDURE IN DETAIL:  The patient was brought to the operating room, where a general anesthetic was carried out without difficulty.  He was prepped.  A time-out was taken.  He was draped in supine position with the left arm free.  The arm was elevated for exsanguination.  Tourniquet placed on the upper arm was inflated to 250 mmHg.  The area of potential pointing of the abscess was incised.  This was followed down to the  ECU sheath.  This area was opened and cultures were taken.  The open area on the dorsal aspect of his wrist was also probed.  This led to a large abscess in the midcarpal joint.  This was copiously irrigated with saline after cultures were taken for atypical mycobacteria aerobic and anaerobic cultures.  The open area to the index metacarpal was also reopened and irrigated and packed and marginally debrided with blunt and sharp dissection.  Each of these areas were then copiously irrigated with saline packed with Iodoform gauze.  A sterile compressive dressing and volar splint was applied.  On deflation of the tourniquet, all fingers immediately pinked.  A local infiltration with 0.25% bupivacaine without epinephrine was given to the open areas, approximately 8 mL was used.  The patient tolerated the procedure well and was taken to the recovery room for observation in satisfactory condition.  He will be discharged home to return to the [facility] [place] on Monday on Percocet.  He was maintained on present antibiotics, being supervised by Daiva Eves.          ______________________________ Cindee Salt, M.D.  GK/MEDQ  D:  08/09/2014  T:  08/09/2014  Job:  [id number]

## 2014-08-12 LAB — WOUND CULTURE
CULTURE: NO GROWTH
Gram Stain: NONE SEEN

## 2014-08-14 ENCOUNTER — Encounter (HOSPITAL_BASED_OUTPATIENT_CLINIC_OR_DEPARTMENT_OTHER): Payer: Self-pay | Admitting: Orthopedic Surgery

## 2014-08-14 LAB — ANAEROBIC CULTURE: Gram Stain: NONE SEEN

## 2014-08-31 ENCOUNTER — Encounter: Payer: Self-pay | Admitting: Infectious Disease

## 2014-09-03 ENCOUNTER — Ambulatory Visit (INDEPENDENT_AMBULATORY_CARE_PROVIDER_SITE_OTHER): Payer: Managed Care, Other (non HMO) | Admitting: Infectious Disease

## 2014-09-03 ENCOUNTER — Encounter (HOSPITAL_BASED_OUTPATIENT_CLINIC_OR_DEPARTMENT_OTHER): Payer: Self-pay | Admitting: *Deleted

## 2014-09-03 ENCOUNTER — Other Ambulatory Visit: Payer: Self-pay | Admitting: Orthopedic Surgery

## 2014-09-03 ENCOUNTER — Encounter: Payer: Self-pay | Admitting: Infectious Disease

## 2014-09-03 VITALS — BP 159/85 | HR 99 | Temp 98.4°F | Wt 161.5 lb

## 2014-09-03 DIAGNOSIS — M009 Pyogenic arthritis, unspecified: Secondary | ICD-10-CM

## 2014-09-03 DIAGNOSIS — K50918 Crohn's disease, unspecified, with other complication: Secondary | ICD-10-CM

## 2014-09-03 DIAGNOSIS — A319 Mycobacterial infection, unspecified: Secondary | ICD-10-CM | POA: Diagnosis not present

## 2014-09-03 DIAGNOSIS — M869 Osteomyelitis, unspecified: Secondary | ICD-10-CM

## 2014-09-03 DIAGNOSIS — E2740 Unspecified adrenocortical insufficiency: Secondary | ICD-10-CM

## 2014-09-03 NOTE — Progress Notes (Signed)
Subjective:    Patient ID: Lee Klein, male    DOB: 12-Oct-1950, 64 y.o.   MRN: [id number]  HPI   64 y.o. 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 [name] in January of this year.  He was admitted to the hospital and we started patient on IV cefoxitin high dose, BID clarithro and BID zyvox.  He had incision and drainage of wrist with thorough debridementincluding distal radioulnar joint, incision and drainage of palm withdrainage of 2 abscesses, one on the index, one on the ring finger on February 5th, 2016.  Since he left the hospital he is having a VERY hard time tolerating his abx with metallic taste in mouth, poor appetite nausea and feeling of burning down his throat.  He had sensitivities sent off to [facility] as well as to [facility] and they're listed below.      We switched him over to intravenous tigecycline along with 3 times weekly IV amikacin and continued azithromycin.  His left hand swelled up further and he required further incision and debridement by Dr. [name]. Articular found what appear to be infected bone consistent with MRI findings but did not remove actual bone but scraped unhealthy material from around this and sent this for bacterial cultures which were negative.  In the interim the patient also had continued to feel poorly and his gastroneurologist and suggested that he might be suffering from adrenal insufficiency. Apparently the patient been on prednisone for 39 years and had been down on 5 mg of prednisone when seen in the hospital but was taken off of it "cold [place]". He was placed back on prednisone at 10 mg and now 5 mg a day and is improved much and this with much less nausea malaise and fatigue. His appetite also has returned. This despite the fact he is also getting tigecycline and azithromycin.  He is see Dr. [name]  regularly and has often been seen 3x per week  He underwent another I and D on 08/09/14 and SO far the m abscessus is not growing. Apparently there are some areas of purulence per the wife where "pus is coming out of the bone." and pt is to have I and D again in next few days. He is getting back better ROM in fingers and wrist.     Review of Systems  Constitutional: Negative for fever, chills and diaphoresis.  HENT: Negative for congestion, rhinorrhea, sinus pressure, sneezing and sore throat.   Eyes: Negative for photophobia and visual disturbance.  Respiratory: Negative for cough, chest tightness, shortness of breath, wheezing and stridor.   Cardiovascular: Negative for chest pain, palpitations and leg swelling.  Gastrointestinal: Negative for vomiting, abdominal pain, diarrhea, constipation, blood in stool, abdominal distention and anal bleeding.  Genitourinary: Negative for dysuria, hematuria, flank pain and difficulty urinating.  Musculoskeletal: Negative for myalgias, back pain, joint swelling, arthralgias and gait problem.  Skin: Negative for color change, pallor, rash and wound.  Neurological: Negative for dizziness, weakness and light-headedness.  Hematological: Negative for adenopathy. Does not bruise/bleed easily.  Psychiatric/Behavioral: Negative for behavioral problems, confusion, sleep disturbance, decreased concentration and agitation.       Objective:   Physical Exam  Constitutional: He is oriented to person, place, and time. He appears well-nourished.  HENT:  Head: Normocephalic and atraumatic.  Mouth/Throat: Oropharynx is clear and moist. No oropharyngeal exudate.  Eyes: Conjunctivae and EOM are  normal.  Neck: Normal range of motion. Neck supple.  Cardiovascular: Normal rate and regular rhythm.   Pulmonary/Chest: Effort normal. No respiratory distress. He has no wheezes.  Abdominal: Soft. He exhibits no distension.  Musculoskeletal: Normal range of motion. He  exhibits no edema or tenderness.       Arms: Neurological: He is alert and oriented to person, place, and time.  Skin: Skin is warm and dry. No rash noted. No erythema. No pallor.  Psychiatric: He has a normal mood and affect.          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 Daily tygacil and azithromycin  Continued close SURGICAL followup and debridement followed by protracted antibiotic course  I spent greater than 25  minutes with the patient including greater than 50% of time in face to face counsel of the patient and in coordination of their care.  Adrenal insufficiency: Apparent adrenal insufficiency based on conical history on steroids and knee feels much better I would like to have him tapered off carefully and Ulyses Southward has worked on a regimen for the patient.  Crohn's disease: quiescent

## 2014-09-04 ENCOUNTER — Other Ambulatory Visit: Payer: Self-pay | Admitting: Orthopedic Surgery

## 2014-09-04 ENCOUNTER — Encounter (HOSPITAL_BASED_OUTPATIENT_CLINIC_OR_DEPARTMENT_OTHER): Admission: RE | Payer: Self-pay | Source: Ambulatory Visit

## 2014-09-04 ENCOUNTER — Encounter (HOSPITAL_BASED_OUTPATIENT_CLINIC_OR_DEPARTMENT_OTHER): Payer: Self-pay | Admitting: *Deleted

## 2014-09-04 ENCOUNTER — Ambulatory Visit (HOSPITAL_BASED_OUTPATIENT_CLINIC_OR_DEPARTMENT_OTHER)
Admission: RE | Admit: 2014-09-04 | Payer: Managed Care, Other (non HMO) | Source: Ambulatory Visit | Admitting: Orthopedic Surgery

## 2014-09-04 SURGERY — INCISION AND DRAINAGE
Anesthesia: Choice | Laterality: Left

## 2014-09-04 MED ORDER — FENTANYL CITRATE (PF) 100 MCG/2ML IJ SOLN
INTRAMUSCULAR | Status: AC
  Filled 2014-09-04: qty 4

## 2014-09-04 MED ORDER — MIDAZOLAM HCL 2 MG/2ML IJ SOLN
INTRAMUSCULAR | Status: AC
  Filled 2014-09-04: qty 2

## 2014-09-05 ENCOUNTER — Ambulatory Visit (HOSPITAL_BASED_OUTPATIENT_CLINIC_OR_DEPARTMENT_OTHER): Payer: Managed Care, Other (non HMO) | Admitting: Anesthesiology

## 2014-09-05 ENCOUNTER — Ambulatory Visit (HOSPITAL_BASED_OUTPATIENT_CLINIC_OR_DEPARTMENT_OTHER)
Admission: RE | Admit: 2014-09-05 | Discharge: 2014-09-05 | Disposition: A | Discharge status: Home / Self Care | Payer: Managed Care, Other (non HMO) | Source: Ambulatory Visit | Attending: Orthopedic Surgery | Admitting: Orthopedic Surgery

## 2014-09-05 ENCOUNTER — Encounter (HOSPITAL_BASED_OUTPATIENT_CLINIC_OR_DEPARTMENT_OTHER): Payer: Self-pay | Admitting: Anesthesiology

## 2014-09-05 ENCOUNTER — Encounter (HOSPITAL_BASED_OUTPATIENT_CLINIC_OR_DEPARTMENT_OTHER)
Admission: RE | Disposition: A | Discharge status: Home / Self Care | Payer: Self-pay | Source: Ambulatory Visit | Attending: Orthopedic Surgery

## 2014-09-05 DIAGNOSIS — L02414 Cutaneous abscess of left upper limb: Secondary | ICD-10-CM | POA: Diagnosis not present

## 2014-09-05 DIAGNOSIS — F419 Anxiety disorder, unspecified: Secondary | ICD-10-CM | POA: Diagnosis not present

## 2014-09-05 DIAGNOSIS — Z87891 Personal history of nicotine dependence: Secondary | ICD-10-CM | POA: Insufficient documentation

## 2014-09-05 DIAGNOSIS — B9689 Other specified bacterial agents as the cause of diseases classified elsewhere: Secondary | ICD-10-CM | POA: Diagnosis not present

## 2014-09-05 DIAGNOSIS — Z9049 Acquired absence of other specified parts of digestive tract: Secondary | ICD-10-CM | POA: Diagnosis not present

## 2014-09-05 DIAGNOSIS — Z888 Allergy status to other drugs, medicaments and biological substances status: Secondary | ICD-10-CM | POA: Diagnosis not present

## 2014-09-05 DIAGNOSIS — Z91013 Allergy to seafood: Secondary | ICD-10-CM | POA: Diagnosis not present

## 2014-09-05 DIAGNOSIS — Z87442 Personal history of urinary calculi: Secondary | ICD-10-CM | POA: Insufficient documentation

## 2014-09-05 DIAGNOSIS — K219 Gastro-esophageal reflux disease without esophagitis: Secondary | ICD-10-CM | POA: Diagnosis not present

## 2014-09-05 DIAGNOSIS — Z91041 Radiographic dye allergy status: Secondary | ICD-10-CM | POA: Insufficient documentation

## 2014-09-05 DIAGNOSIS — L02512 Cutaneous abscess of left hand: Secondary | ICD-10-CM | POA: Diagnosis not present

## 2014-09-05 DIAGNOSIS — B998 Other infectious disease: Secondary | ICD-10-CM | POA: Diagnosis present

## 2014-09-05 DIAGNOSIS — Z7982 Long term (current) use of aspirin: Secondary | ICD-10-CM | POA: Insufficient documentation

## 2014-09-05 HISTORY — PX: INCISION AND DRAINAGE ABSCESS: SHX5864

## 2014-09-05 SURGERY — INCISION AND DRAINAGE, ABSCESS
Anesthesia: General | Site: Hand | Laterality: Left

## 2014-09-05 MED ORDER — HYDROMORPHONE HCL 1 MG/ML IJ SOLN
INTRAMUSCULAR | Status: AC
  Filled 2014-09-05: qty 1

## 2014-09-05 MED ORDER — DEXAMETHASONE SODIUM PHOSPHATE 4 MG/ML IJ SOLN
INTRAMUSCULAR | Status: DC | PRN
  Administered 2014-09-05: 10 mg via INTRAVENOUS

## 2014-09-05 MED ORDER — CHLORHEXIDINE GLUCONATE 4 % EX LIQD: 60.0000 mL | Freq: Once | CUTANEOUS | Status: DC

## 2014-09-05 MED ORDER — LIDOCAINE HCL (CARDIAC) 20 MG/ML IV SOLN
INTRAVENOUS | Status: DC | PRN
  Administered 2014-09-05: 50 mg via INTRAVENOUS

## 2014-09-05 MED ORDER — OXYCODONE HCL 5 MG/5ML PO SOLN: 5.0000 mg | Freq: Once | ORAL | Status: DC | PRN

## 2014-09-05 MED ORDER — LACTATED RINGERS IV SOLN
INTRAVENOUS | Status: DC
  Administered 2014-09-05 (×2): via INTRAVENOUS

## 2014-09-05 MED ORDER — FENTANYL CITRATE (PF) 100 MCG/2ML IJ SOLN
INTRAMUSCULAR | Status: AC
  Filled 2014-09-05: qty 6

## 2014-09-05 MED ORDER — OXYCODONE HCL 5 MG PO TABS: 5.0000 mg | ORAL_TABLET | Freq: Once | ORAL | Status: DC | PRN

## 2014-09-05 MED ORDER — PROPOFOL 10 MG/ML IV BOLUS
INTRAVENOUS | Status: DC | PRN
  Administered 2014-09-05: 200 mg via INTRAVENOUS

## 2014-09-05 MED ORDER — MIDAZOLAM HCL 5 MG/5ML IJ SOLN
INTRAMUSCULAR | Status: DC | PRN
  Administered 2014-09-05: 2 mg via INTRAVENOUS

## 2014-09-05 MED ORDER — PROMETHAZINE HCL 25 MG/ML IJ SOLN: 6.2500 mg | INTRAMUSCULAR | Status: DC | PRN

## 2014-09-05 MED ORDER — BUPIVACAINE HCL (PF) 0.25 % IJ SOLN
INTRAMUSCULAR | Status: AC
  Filled 2014-09-05: qty 30

## 2014-09-05 MED ORDER — HYDROMORPHONE HCL 1 MG/ML IJ SOLN
0.2500 mg | INTRAMUSCULAR | Status: DC | PRN
  Administered 2014-09-05: 0.5 mg via INTRAVENOUS
  Administered 2014-09-05 (×2): 0.25 mg via INTRAVENOUS

## 2014-09-05 MED ORDER — FENTANYL CITRATE (PF) 100 MCG/2ML IJ SOLN
INTRAMUSCULAR | Status: DC | PRN
  Administered 2014-09-05: 100 ug via INTRAVENOUS
  Administered 2014-09-05: 50 ug via INTRAVENOUS
  Administered 2014-09-05 (×2): 25 ug via INTRAVENOUS

## 2014-09-05 MED ORDER — MIDAZOLAM HCL 2 MG/2ML IJ SOLN
INTRAMUSCULAR | Status: AC
  Filled 2014-09-05: qty 2

## 2014-09-05 MED ORDER — ONDANSETRON HCL 4 MG/2ML IJ SOLN
INTRAMUSCULAR | Status: DC | PRN
  Administered 2014-09-05: 4 mg via INTRAVENOUS

## 2014-09-05 MED ORDER — MIDAZOLAM HCL 2 MG/2ML IJ SOLN: 1.0000 mg | INTRAMUSCULAR | Status: DC | PRN

## 2014-09-05 MED ORDER — OXYCODONE-ACETAMINOPHEN 10-325 MG PO TABS: 1.0000 | ORAL_TABLET | ORAL | Status: DC | PRN

## 2014-09-05 SURGICAL SUPPLY — 49 items
BAG DECANTER FOR FLEXI CONT (MISCELLANEOUS) IMPLANT
BLADE MINI RND TIP GREEN BEAV (BLADE) IMPLANT
BLADE SURG 15 STRL LF DISP TIS (BLADE) ×1 IMPLANT
BLADE SURG 15 STRL SS (BLADE) ×3
BNDG CMPR 9X4 STRL LF SNTH (GAUZE/BANDAGES/DRESSINGS) ×1
BNDG COHESIVE 1X5 TAN STRL LF (GAUZE/BANDAGES/DRESSINGS) IMPLANT
BNDG COHESIVE 3X5 TAN STRL LF (GAUZE/BANDAGES/DRESSINGS) ×2 IMPLANT
BNDG ESMARK 4X9 LF (GAUZE/BANDAGES/DRESSINGS) ×2 IMPLANT
BNDG GAUZE ELAST 4 BULKY (GAUZE/BANDAGES/DRESSINGS) ×2 IMPLANT
CHLORAPREP W/TINT 26ML (MISCELLANEOUS) ×1 IMPLANT
CORDS BIPOLAR (ELECTRODE) ×3 IMPLANT
COVER BACK TABLE 60X90IN (DRAPES) ×3 IMPLANT
COVER MAYO STAND STRL (DRAPES) ×3 IMPLANT
CUFF TOURNIQUET SINGLE 18IN (TOURNIQUET CUFF) ×2 IMPLANT
DRAPE EXTREMITY T 121X128X90 (DRAPE) ×3 IMPLANT
DRAPE SURG 17X23 STRL (DRAPES) ×3 IMPLANT
DRSG KUZMA FLUFF (GAUZE/BANDAGES/DRESSINGS) IMPLANT
GAUZE PACKING IODOFORM 1/4X15 (GAUZE/BANDAGES/DRESSINGS) IMPLANT
GAUZE SPONGE 4X4 12PLY STRL (GAUZE/BANDAGES/DRESSINGS) ×3 IMPLANT
GAUZE XEROFORM 1X8 LF (GAUZE/BANDAGES/DRESSINGS) ×3 IMPLANT
GLOVE BIOGEL PI IND STRL 7.0 (GLOVE) IMPLANT
GLOVE BIOGEL PI IND STRL 8.5 (GLOVE) ×1 IMPLANT
GLOVE BIOGEL PI INDICATOR 7.0 (GLOVE) ×2
GLOVE BIOGEL PI INDICATOR 8.5 (GLOVE) ×2
GLOVE ECLIPSE 7.0 STRL STRAW (GLOVE) ×2 IMPLANT
GLOVE SURG ORTHO 8.0 STRL STRW (GLOVE) ×3 IMPLANT
GOWN STRL REUS W/ TWL LRG LVL3 (GOWN DISPOSABLE) ×1 IMPLANT
GOWN STRL REUS W/TWL LRG LVL3 (GOWN DISPOSABLE) ×3
GOWN STRL REUS W/TWL XL LVL3 (GOWN DISPOSABLE) ×3 IMPLANT
LOOP VESSEL MAXI BLUE (MISCELLANEOUS) IMPLANT
NDL PRECISIONGLIDE 27X1.5 (NEEDLE) IMPLANT
NEEDLE PRECISIONGLIDE 27X1.5 (NEEDLE) ×3 IMPLANT
NS IRRIG 1000ML POUR BTL (IV SOLUTION) ×3 IMPLANT
PACK BASIN DAY SURGERY FS (CUSTOM PROCEDURE TRAY) ×3 IMPLANT
PAD CAST 3X4 CTTN HI CHSV (CAST SUPPLIES) IMPLANT
PADDING CAST ABS 4INX4YD NS (CAST SUPPLIES) ×2
PADDING CAST ABS COTTON 4X4 ST (CAST SUPPLIES) ×1 IMPLANT
PADDING CAST COTTON 3X4 STRL (CAST SUPPLIES) ×3
SPLINT PLASTER CAST XFAST 3X15 (CAST SUPPLIES) ×5 IMPLANT
SPLINT PLASTER XTRA FASTSET 3X (CAST SUPPLIES) ×10
STOCKINETTE 4X48 STRL (DRAPES) ×3 IMPLANT
SWAB COLLECTION DEVICE MRSA (MISCELLANEOUS) ×2 IMPLANT
SYR BULB 3OZ (MISCELLANEOUS) ×3 IMPLANT
SYR CONTROL 10ML LL (SYRINGE) ×3 IMPLANT
TOWEL OR 17X24 6PK STRL BLUE (TOWEL DISPOSABLE) ×3 IMPLANT
TRAY DSU PREP LF (CUSTOM PROCEDURE TRAY) ×2 IMPLANT
TUBE ANAEROBIC SPECIMEN COL (MISCELLANEOUS) ×2 IMPLANT
TUBE FEEDING 5FR 15 INCH (TUBING) IMPLANT
UNDERPAD 30X30 INCONTINENT (UNDERPADS AND DIAPERS) ×3 IMPLANT

## 2014-09-05 NOTE — Anesthesia Preprocedure Evaluation (Signed)
Anesthesia Evaluation  Patient identified by MRN, date of birth, ID band Patient awake    Reviewed: Allergy & Precautions, NPO status , Patient's Chart, lab work & pertinent test results  Airway Mallampati: I  TM Distance: >3 FB Neck ROM: Full    Dental  (+) Teeth Intact, Dental Advisory Given   Pulmonary former smoker,  breath sounds clear to auscultation        Cardiovascular negative cardio ROS  Rhythm:Regular Rate:Normal     Neuro/Psych Anxiety negative neurological ROS     GI/Hepatic Neg liver ROS, GERD-  Medicated and Controlled,  Endo/Other  negative endocrine ROS  Renal/GU negative Renal ROS     Musculoskeletal   Abdominal   Peds  Hematology negative hematology ROS (+)   Anesthesia Other Findings   Reproductive/Obstetrics                            Anesthesia Physical  Anesthesia Plan  ASA: III  Anesthesia Plan: General   Post-op Pain Management:    Induction: Intravenous  Airway Management Planned: LMA  Additional Equipment:   Intra-op Plan:   Post-operative Plan: Extubation in OR  Informed Consent: I have reviewed the patients History and Physical, chart, labs and discussed the procedure including the risks, benefits and alternatives for the proposed anesthesia with the patient or authorized representative who has indicated his/her understanding and acceptance.   Dental advisory given  Plan Discussed with: CRNA, Anesthesiologist and Surgeon  Anesthesia Plan Comments:         Anesthesia Quick Evaluation  

## 2014-09-05 NOTE — H&P (Signed)
Lee Klein is a 64 year-old right-hand dominant male originally referred by Dr. [name] for consultation with respect to swelling of his left dorsal wrist, radial side. This began in May of 2015.  He has seen at least 10 separate physicians and has had multiple injections.  He was given antibiotics when this first began after bowel surgery. He states that it improved, but returned in July.  He has history of Crohn's disease and is on immunosuppression.   Dr. [name] is concerned about the possibility of infection vs. inflammatory processes prior to him starting him on DMARDs.    He has had MRI revealing a scapholunate ligament injury, poor visualization of the ulnar attachment of the triangular fibrocartilage complex, wrist arthroscopy with synovitis.  He underwent arthroscopy of his wrist in an effort to obtain cultures. This came back with mycobacterial infection.  This increased in swelling in February of 2016.  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  He has been in a splint since that time.  He is seen today with an exacerbation of his swelling and development of sinus tracts on the dorsal aspect of his wrist on the radial aspect first web space.  He has decreased mobility, the swelling increased dramatically over the past two days.  He is followed by infectious disease.  He is growing out mycobacterium abscessus.  He has recently been placed on an antibiotic to which it is sensitive.  His wrist has swollen up.  ALLERGIES:  Demerol, IVP dye.  MEDICATIONS:   Imuran, Protonix, vitamins.  SURGICAL HISTORY:  Hernia repair and (7) colon operations.  FAMILY MEDICAL HISTORY:   Positive for arthritis otherwise negative.  SOCIAL HISTORY:  He does not smoke or drink.  He is married.  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    REVIEW OF SYSTEMS:   Positive for glasses, Crohn's disease, otherwise negative 14 points.   Lee Klein is an 64 y.o. male.   Chief Complaint: mycobacterial infection left hand HPI: see above  Past Medical History  Diagnosis Date  . Crohn's disease   . Hernia, abdominal     "repaired 10/2013"  . GERD (gastroesophageal reflux disease)   . Anxiety   . 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 Kidney stones     "passed them"    Past Surgical History  Procedure Laterality Date  . Ileostomy  06/2010; 10/2013  . Colonoscopy    . Upper gastrointestinal endoscopy    . Appendectomy  ~ 2009  . Ileostomy closure N/A 10/23/2013    Procedure: ILEOSTOMY TAKEDOWN, REPAIR OF PARASTOMAL HERNIA.;  Surgeon: Cherylynn Ridges, MD;  Location: [facility] OR;  Service: General;  Laterality: N/A;  . Wrist arthroscopy with debridement Left 05/24/2014    Procedure: LEFT ARTHROSCOPY WRIST CULTURE/BIOPSY DEBRIDEMENT;  Surgeon: Cindee Salt, MD;  Location: [facility];  Service: Orthopedics;  Laterality: Left;  . Wrist fracture surgery Right 06/2006    [name] 09/29/2010  . Esophagogastroduodenoscopy N/A 06/15/2014    Procedure: ESOPHAGOGASTRODUODENOSCOPY (EGD);  Surgeon: Malissa Hippo, MD;  Location: [facility];  Service: Endoscopy;  Laterality: N/A;  . Cholecystectomy  ~ 2008  . Tonsillectomy  1963  . Fracture surgery    . Hernia repair  10/2013    parastomal/notes 10/03/2013  . Colon surgery  ~ 2010    for a colonic stricture at the anastomosis from a  colostomy reversal/notes 10/03/2013  . Parastomal hernia repair  10/2013  . Incision and drainage abscess Left 06/22/2014    Procedure: INCISION, DEBRIDEMENT AND IRRIGATION LEFT HAND/WRIST;  Surgeon: Cindee Salt, MD;  Location: [facility];  Service: Orthopedics;  Laterality: Left;  . Incision and drainage of wound Left 07/27/2014    Procedure: IRRIGATION AND DEBRIDEMENT WOUND;  Surgeon: Cindee Salt, MD;  Location: [facility];  Service: Orthopedics;  Laterality: Left;  . Incision and drainage abscess Left  08/09/2014    Procedure: INCISION AND DRAINAGE LEFT WRIST;  Surgeon: Cindee Salt, MD;  Location: [facility];  Service: Orthopedics;  Laterality: Left;    Family History  Problem Relation Age of Onset  . Healthy Mother   . Healthy Daughter   . Healthy Son    Social History:  reports that he quit smoking about 8 years ago. His smoking use included Cigars. He has never used smokeless tobacco. He reports that he does not drink alcohol or use illicit drugs.  Allergies:  Allergies  Allergen Reactions  . Ivp Dye [Iodinated Diagnostic Agents]     Joint pain, nausea  . Fish Allergy Nausea And Vomiting  . Rifampin Nausea And Vomiting and Other (See Comments)    Chills  . Shellfish Allergy Swelling  . Demerol Rash    Medications Prior to Admission  Medication Sig Dispense Refill  . acetaminophen (TYLENOL) 325 MG tablet Take 650 mg by mouth every 6 (six) hours as needed (pain).    Marland Kitchen ALPRAZolam (XANAX) 0.5 MG tablet TAKE 1 TABLET BY MOUTH TWO TIMES DAILY AS NEEDED FOR ANXIETY 60 tablet 2  . Ascorbic Acid (VITAMIN C) 1000 MG tablet Take 1,000 mg by mouth daily.    Marland Kitchen azithromycin (ZITHROMAX) 600 MG tablet Take 1 tablet (600 mg total) by mouth daily. (Patient taking differently: Take 600 mg by mouth every evening. TAKES AT 8PM WITH ZYVOX) 30 tablet 11  . Calcium Carbonate-Vitamin D (CALCIUM 600 + D PO) Take 1 tablet by mouth daily.     Marland Kitchen HYDROcodone-acetaminophen (NORCO) 10-325 MG per tablet Take 1 tablet by mouth every 6 (six) hours as needed. 30 tablet 0  . loperamide (IMODIUM A-D) 2 MG tablet Take 8 mg by mouth 2 (two) times daily.     . Multiple Vitamins-Minerals (MULTIVITAMIN PO) Take 1 tablet by mouth daily.     . ondansetron (ZOFRAN) 4 MG tablet Take 1 tablet (4 mg total) by mouth 2 (two) times daily as needed for nausea or vomiting. 30 tablet 0  . pantoprazole (PROTONIX) 40 MG tablet Take 1 tablet (40 mg total) by mouth 2 (two) times daily before a meal. 180 tablet 4  .  predniSONE (DELTASONE) 5 MG tablet Take 5 mg by mouth daily with breakfast.    . Tigecycline (TYGACIL IV) Inject 50 mg into the vein 2 (two) times daily.    . Zinc 50 MG CAPS Take 1 capsule by mouth daily.     Marland Kitchen aspirin EC 81 MG tablet Take 1 tablet (81 mg total) by mouth daily.    . fluticasone (FLONASE) 50 MCG/ACT nasal spray Place 2 sprays into both nostrils daily as needed for allergies or rhinitis.   2  . nystatin (MYCOSTATIN/NYSTOP) 100000 UNIT/GM POWD Apply topically daily as needed (FOR IRRITATION). PRN  1  . oxyCODONE-acetaminophen (PERCOCET) 10-325 MG per tablet Take 1 tablet by mouth every 4 (four) hours as needed for pain. 30 tablet 0  . promethazine (PHENERGAN)  25 MG suppository Place 1 suppository (25 mg total) rectally every 6 (six) hours as needed for nausea or vomiting. 24 each 0    No results found for this or any previous visit (from the past 48 hour(s)).  No results found.   Pertinent items are noted in HPI.  Blood pressure 153/83, pulse 97, temperature 97.7 F (36.5 C), temperature source Oral, resp. rate 52, height  (1.778 m), weight 71.215 kg (157 lb), SpO2 100 %.  General appearance: alert and appears stated age Head: Normocephalic, without obvious abnormality Neck: no JVD Resp: clear to auscultation bilaterally Cardio: regular rate and rhythm, S1, S2 normal, no murmur, click, rub or gallop GI: soft, non-tender; bowel sounds normal; no masses,  no organomegaly Extremities: draing sinuses left hand Pulses: 2+ and symmetric Skin: Skin color, texture, turgor normal. No rashes or lesions Neurologic: Grossly normal Incision/Wound: Open drainage area  Assessment/Plan RADIOGRAPHS:   X-rays reveal continuation of the destruction of his midcarpal joint.  He has a new osteomyelitic abscess in his index metacarpal head of his left hand.  PLAN:   I&D, this will be packed open.  He and his wife are aware that this may well develop into sinus tracts which may take  a considerable period of time to close.  This will be scheduled as an outpatient under anesthesia.  Floye Fesler R 09/05/2014, 11:49 AM

## 2014-09-05 NOTE — Discharge Instructions (Addendum)

## 2014-09-05 NOTE — Anesthesia Procedure Notes (Signed)
Procedure Name: LMA Insertion Date/Time: 09/05/2014 12:24 PM Performed by: Genevieve Norlander L Pre-anesthesia Checklist: Patient identified, Emergency Drugs available, Suction available, Patient being monitored and Timeout performed Patient Re-evaluated:Patient Re-evaluated prior to inductionOxygen Delivery Method: Circle System Utilized Preoxygenation: Pre-oxygenation with 100% oxygen Intubation Type: IV induction Ventilation: Mask ventilation without difficulty LMA: LMA inserted LMA Size: 5.0 Number of attempts: 1 Airway Equipment and Method: Bite block Placement Confirmation: positive ETCO2 Tube secured with: Tape Dental Injury: Teeth and Oropharynx as per pre-operative assessment

## 2014-09-05 NOTE — Op Note (Signed)
Dictation Number [id number]

## 2014-09-05 NOTE — Anesthesia Postprocedure Evaluation (Signed)
Anesthesia H&P Update: History and Physical Exam reviewed; patient is OK for planned anesthetic and procedure. ? ?

## 2014-09-05 NOTE — Brief Op Note (Signed)
09/05/2014  12:58 PM  PATIENT:  Lee Klein  64 y.o. male  PRE-OPERATIVE DIAGNOSIS:  INFECTION LEFT HAND  POST-OPERATIVE DIAGNOSIS:  INFECTION LEFT HAND  PROCEDURE:  Procedure(s): INCISION AND DRAINAGE LEFT HAND (Left)  SURGEON:  Surgeon(s) and Role:    * Cindee Salt, MD - Primary  PHYSICIAN ASSISTANT:   ASSISTANTS: none   ANESTHESIA:   general  EBL:  Total I/O In: 700 [I.V.:700] Out: -   BLOOD ADMINISTERED:none  DRAINS: none   LOCAL MEDICATIONS USED:  NONE  SPECIMEN:  Excision  DISPOSITION OF SPECIMEN:  PATHOLOGY  COUNTS:  YES  TOURNIQUET:   Total Tourniquet Time Documented: Upper Arm (laterality) - 15 minutes Total: Upper Arm (laterality) - 15 minutes   DICTATION: .Other Dictation: Dictation Number [id number]  PLAN OF CARE: Discharge to home after PACU  PATIENT DISPOSITION:  PACU - hemodynamically stable.

## 2014-09-05 NOTE — Transfer of Care (Signed)
Immediate Anesthesia Transfer of Care Note  Patient: Lee Klein  Procedure(s) Performed: Procedure(s): INCISION AND DRAINAGE LEFT HAND (Left)  Patient Location: PACU  Anesthesia Type:General  Level of Consciousness: awake and patient cooperative  Airway & Oxygen Therapy: Patient Spontanous Breathing and Patient connected to face mask oxygen  Post-op Assessment: Report given to RN and Post -op Vital signs reviewed and stable  Post vital signs: Reviewed and stable  Last Vitals:  Filed Vitals:   09/05/14 1048  BP: 153/83  Pulse: 97  Temp: 36.5 C  Resp: 52    Complications: No apparent anesthesia complications

## 2014-09-06 NOTE — Op Note (Signed)
NAMEJABES, BROTHERSON                ACCOUNT NO.:  [id number]  MEDICAL RECORD NO.:  [id number]  LOCATION:                                FACILITY:  [facility]  PHYSICIAN:  Cindee Salt, M.D.       DATE OF BIRTH:  1951/01/21  DATE OF PROCEDURE:  09/05/2014 DATE OF DISCHARGE:  09/05/2014                              OPERATIVE REPORT   PREOPERATIVE DIAGNOSIS:  Mycobacterial infection, left hand and left wrist.  POSTOPERATIVE DIAGNOSIS:  Mycobacterial infection, left hand and left wrist.  OPERATION:  Incision and drainage of thumb and wrist, left hand.  SURGEON:  Cindee Salt, M.D.  ANESTHESIA:  General.  ANESTHESIOLOGIST:  Sheldon Silvan, M.D.  HISTORY:  The patient is a 64 year old male with history of mycobacterial infection.  He has been treated with multiple incision and drainages.  He was admitted for reaccumulation of his wrist.  He is also complaining of some swelling of the metacarpophalangeal joint area of the thumb, which is relatively recent and he was admitted for re- incision and drainage.  Pre, peri, and postoperative courses are well known to him.  PROCEDURE IN DETAIL:  The patient was brought to the operating room where general anesthetic was carried out without difficulty.  He was prepped using ChloraPrep, supine position with the left arm free.  The limb was elevated for exsanguination.  Tourniquet placed on the upper arm inflated to 250 mmHg after time-out confirming the patient and procedure.  A longitudinal incision was made over the draining area of the dorsal aspect of his left wrist, carried down through the subcutaneous tissue through the extensor retinaculum sweeping away extensor tendons.  The dissection was carried down to the joint, this was then thoroughly debrided with rongeur, and copiously irrigated with saline.  Cultures were taken for anaerobic, anaerobic and mycobacterial cultures.  A separate incision was then made over the metacarpophalangeal joint,  carried down through the subcutaneous tissue. Granulation tissue was not noted, this area was irrigated and packed. Each of the wounds were then packed including the one over the index finger, the two over the ulnar aspect of the wrist with Iodoform gauze along with the central area, which had just been opened.  A sterile compressive dressing, volar splint was applied.  On deflation of the tourniquet, all fingers were immediately pinked.  He was taken to the recovery room for observation in satisfactory condition.  He will be discharged to home to return to the [facility] [facility] in 1 week, on Percocet.          ______________________________ Cindee Salt, M.D.     GK/MEDQ  D:  09/05/2014  T:  09/05/2014  Job:  [id number]

## 2014-09-07 ENCOUNTER — Encounter (HOSPITAL_BASED_OUTPATIENT_CLINIC_OR_DEPARTMENT_OTHER): Payer: Self-pay | Admitting: Orthopedic Surgery

## 2014-09-07 LAB — WOUND CULTURE: Culture: NO GROWTH

## 2014-09-07 NOTE — Addendum Note (Signed)
Addendum  created 09/07/14 1018 by Sheldon Silvan, MD   Modules edited: Notes Section   Notes Section:  File: [id number]

## 2014-09-07 NOTE — Anesthesia Postprocedure Evaluation (Signed)
  Anesthesia Post-op Note  Patient: Lee Klein  Procedure(s) Performed: Procedure(s): INCISION AND DRAINAGE LEFT HAND (Left)  Patient Location: PACU  Anesthesia Type: General   Level of Consciousness: awake, alert  and oriented  Airway and Oxygen Therapy: Patient Spontanous Breathing  Post-op Pain: mild  Post-op Assessment: Post-op Vital signs reviewed  Post-op Vital Signs: Reviewed  Last Vitals:  Filed Vitals:   09/05/14 1452  BP: 109/88  Pulse: 69  Temp: 36.7 C  Resp: 20    Complications: No apparent anesthesia complications

## 2014-09-08 LAB — TISSUE CULTURE: Culture: NO GROWTH

## 2014-09-10 LAB — ANAEROBIC CULTURE

## 2014-09-11 ENCOUNTER — Encounter (INDEPENDENT_AMBULATORY_CARE_PROVIDER_SITE_OTHER): Payer: Self-pay | Admitting: Internal Medicine

## 2014-09-11 ENCOUNTER — Ambulatory Visit (INDEPENDENT_AMBULATORY_CARE_PROVIDER_SITE_OTHER): Payer: Managed Care, Other (non HMO) | Admitting: Internal Medicine

## 2014-09-11 VITALS — BP 122/80 | HR 92 | Temp 97.9°F | Resp 18 | Ht 70.0 in | Wt 159.1 lb

## 2014-09-11 DIAGNOSIS — K50919 Crohn's disease, unspecified, with unspecified complications: Secondary | ICD-10-CM | POA: Diagnosis not present

## 2014-09-11 DIAGNOSIS — K219 Gastro-esophageal reflux disease without esophagitis: Secondary | ICD-10-CM

## 2014-09-11 DIAGNOSIS — R600 Localized edema: Secondary | ICD-10-CM

## 2014-09-11 NOTE — Patient Instructions (Signed)
Physician will call with results of blood work when completed. Call if there is significant increase in stool output.

## 2014-09-11 NOTE — Progress Notes (Signed)
Presenting complaint;  Follow-up for Crohn's disease, GERD. Patient complains of lower extremity edema.  Subjective:  Lee Klein is 64 year old Caucasian male who is here for scheduled visit accompanied by his wife Lee Klein. As Lee Klein GI symptoms are concerned he feels much better since he has been back on prednisone. He has not taken Zofran in over a month. He has a confusion doses of promethazine. His appetite is back to normal. He has heartburn occasionally with certain foods. Last time he throughout was over 6 weeks ago. He continues to have pain and swelling involving his left wrist and left hand. He underwent general another I&D by Dr. [name] 6 days ago. He is now on 3 IV antibiotics under care of Dr. Daiva Eves of ID service at [facility]. He denies bleeding or melena into colostomy. Colostomy output has not changed since he's been broad-spectrum antibiotics. Few weeks ago he passed stool per rectum. He has not had rectal discharge or bleeding. He continues have problems with lower extremity edema which has decreased but not gone away completely.  Current Medications: Outpatient Encounter Prescriptions as of 09/11/2014  Medication Sig  . acetaminophen (TYLENOL) 325 MG tablet Take 650 mg by mouth every 6 (six) hours as needed (pain).  Marland Kitchen ALPRAZolam (XANAX) 0.5 MG tablet TAKE 1 TABLET BY MOUTH TWO TIMES DAILY AS NEEDED FOR ANXIETY  . amikacin in dextrose 5 % 100 mL Inject 1,800 mg into the vein. Three times per week. Monday , Wednesday, Friday.  . Ascorbic Acid (VITAMIN C) 1000 MG tablet Take 1,000 mg by mouth daily.  Marland Kitchen azithromycin (ZITHROMAX) 600 MG tablet Take 1 tablet (600 mg total) by mouth daily. (Patient taking differently: Take 600 mg by mouth every evening. TAKES AT 8PM WITH ZYVOX)  . Calcium Carbonate-Vitamin D (CALCIUM 600 + D PO) Take 1 tablet by mouth daily.   Marland Kitchen HYDROcodone-acetaminophen (NORCO) 10-325 MG per tablet Take 1 tablet by mouth every 6 (six) hours as needed.  . loperamide (IMODIUM A-D) 2  MG tablet Take 8 mg by mouth 2 (two) times daily.   . Multiple Vitamins-Minerals (MULTIVITAMIN PO) Take 1 tablet by mouth daily.   Marland Kitchen nystatin (MYCOSTATIN/NYSTOP) 100000 UNIT/GM POWD Apply topically daily as needed (FOR IRRITATION). PRN  . ondansetron (ZOFRAN) 4 MG tablet Take 1 tablet (4 mg total) by mouth 2 (two) times daily as needed for nausea or vomiting.  . pantoprazole (PROTONIX) 40 MG tablet Take 1 tablet (40 mg total) by mouth 2 (two) times daily before a meal.  . predniSONE (DELTASONE) 5 MG tablet Take 5 mg by mouth daily with breakfast.  . promethazine (PHENERGAN) 25 MG tablet Take 25 mg by mouth as needed.   . Tigecycline (TYGACIL IV) Inject 50 mg into the vein 2 (two) times daily.  . Zinc 50 MG CAPS Take 1 capsule by mouth daily.   Marland Kitchen aspirin EC 81 MG tablet Take 1 tablet (81 mg total) by mouth daily. (Patient not taking: Reported on 09/11/2014)  . [DISCONTINUED] fluticasone (FLONASE) 50 MCG/ACT nasal spray Place 2 sprays into both nostrils daily as needed for allergies or rhinitis.   . [DISCONTINUED] oxyCODONE-acetaminophen (PERCOCET) 10-325 MG per tablet Take 1 tablet by mouth every 4 (four) hours as needed for pain. (Patient not taking: Reported on 09/11/2014)  . [DISCONTINUED] oxyCODONE-acetaminophen (PERCOCET) 10-325 MG per tablet Take 1 tablet by mouth every 4 (four) hours as needed for pain. (Patient not taking: Reported on 09/11/2014)  . [DISCONTINUED] promethazine (PHENERGAN) 25 MG suppository Place 1 suppository (25  mg total) rectally every 6 (six) hours as needed for nausea or vomiting. (Patient not taking: Reported on 09/11/2014)     Objective: Blood pressure 122/80, pulse 92, temperature 97.9 F (36.6 C), temperature source Oral, resp. rate 18, height  (1.778 m), weight 159 lb 1.6 oz (72.167 kg). Patient is alert and in no acute distress. Conjunctiva is pink. Sclera is nonicteric Oropharyngeal mucosa is normal. No neck masses or thyromegaly noted. Cardiac exam with  regular rhythm normal S1 and S2. No murmur or gallop noted. Lungs are clear to auscultation. Abdomen. Colostomy is located in right mid abdomen. Colostomy bag has thick brownish stool. There is any stomal hernia but abdomen is soft and nontender without organomegaly or masses. There is edema to thumb and all fingers of left hand. He has dressing covering hand and wrist.    Labs/studies Results: Lab data from 08/30/2014  WBC 9.6, H&H 10 and 31.7 and platelet count is 262K  Serum sodium 135, potassium 3.6, chloride 109, CO2 21, glucose 121, BUN 14, creatinine 1.05  Bilirubin 0.5, AP 131, AST 37, ALT 46 and albumin is 2.5.   Assessment:  #1. Crohn's disease. He appears to be in remission. He is not having any problems or issues with colostomy. He is on 3 antibiotics for MAI. Therefore need to watch him closely for C. difficile. #2. GERD. Heartburn is well controlled with therapy. He's having intermittent nausea possibly secondary to antibiotics #3. Lower extremity edema. Albumin 10 days ago was 2.5. Therefore edema may be secondary to third spacing due to low serum albumin. #4. Adrenal insufficiency. Patient has been on her weight since 1978. Prior attempts at getting him off prednisone have failed and recent acute illness off steroid would suggest that he has iatrogenic adrenal insufficiency. He appears to be doing fine with low-dose prednisone. #5. 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 He is status post I&D 6 days ago. He is on three IV antibiotics and renal function is being monitored closely. #6. Anemia most likely secondary to ongoing osteomyelitis of left wrist and hand. #7. Mildly elevated ALT with normal AST. As noted above serum albumin is low. He does not have stigmata of chronic liver disease. Unless serum albumin improves he may need abdominal imaging to rule out chronic liver disease.   Plan:  Patient will have pre-albumin  and LFTs with next blood draw. Office visit in 3 months. Patient will continue pantoprazole at 40 mg by mouth every morning.

## 2014-09-12 LAB — ANAEROBIC CULTURE

## 2014-09-17 ENCOUNTER — Ambulatory Visit (INDEPENDENT_AMBULATORY_CARE_PROVIDER_SITE_OTHER): Payer: Managed Care, Other (non HMO) | Admitting: Internal Medicine

## 2014-09-19 ENCOUNTER — Encounter (INDEPENDENT_AMBULATORY_CARE_PROVIDER_SITE_OTHER): Payer: Self-pay

## 2014-09-21 LAB — AFB CULTURE WITH SMEAR (NOT AT ARMC): Acid Fast Smear: NONE SEEN

## 2014-10-01 ENCOUNTER — Other Ambulatory Visit: Payer: Self-pay | Admitting: Orthopedic Surgery

## 2014-10-01 ENCOUNTER — Encounter (HOSPITAL_BASED_OUTPATIENT_CLINIC_OR_DEPARTMENT_OTHER): Payer: Self-pay | Admitting: *Deleted

## 2014-10-02 ENCOUNTER — Encounter (HOSPITAL_BASED_OUTPATIENT_CLINIC_OR_DEPARTMENT_OTHER)
Admission: RE | Disposition: A | Discharge status: Home / Self Care | Payer: Self-pay | Source: Ambulatory Visit | Attending: Orthopedic Surgery

## 2014-10-02 ENCOUNTER — Ambulatory Visit (HOSPITAL_BASED_OUTPATIENT_CLINIC_OR_DEPARTMENT_OTHER): Payer: Managed Care, Other (non HMO) | Admitting: Anesthesiology

## 2014-10-02 ENCOUNTER — Ambulatory Visit (HOSPITAL_BASED_OUTPATIENT_CLINIC_OR_DEPARTMENT_OTHER)
Admission: RE | Admit: 2014-10-02 | Discharge: 2014-10-02 | Disposition: A | Discharge status: Home / Self Care | Payer: Managed Care, Other (non HMO) | Source: Ambulatory Visit | Attending: Orthopedic Surgery | Admitting: Orthopedic Surgery

## 2014-10-02 ENCOUNTER — Encounter (HOSPITAL_BASED_OUTPATIENT_CLINIC_OR_DEPARTMENT_OTHER): Payer: Self-pay | Admitting: Orthopedic Surgery

## 2014-10-02 DIAGNOSIS — M868X4 Other osteomyelitis, hand: Secondary | ICD-10-CM | POA: Diagnosis not present

## 2014-10-02 DIAGNOSIS — Z87442 Personal history of urinary calculi: Secondary | ICD-10-CM | POA: Insufficient documentation

## 2014-10-02 DIAGNOSIS — K219 Gastro-esophageal reflux disease without esophagitis: Secondary | ICD-10-CM | POA: Diagnosis not present

## 2014-10-02 DIAGNOSIS — F419 Anxiety disorder, unspecified: Secondary | ICD-10-CM | POA: Insufficient documentation

## 2014-10-02 DIAGNOSIS — Z87891 Personal history of nicotine dependence: Secondary | ICD-10-CM | POA: Diagnosis not present

## 2014-10-02 DIAGNOSIS — Z91013 Allergy to seafood: Secondary | ICD-10-CM | POA: Insufficient documentation

## 2014-10-02 DIAGNOSIS — Z91041 Radiographic dye allergy status: Secondary | ICD-10-CM | POA: Insufficient documentation

## 2014-10-02 DIAGNOSIS — Z888 Allergy status to other drugs, medicaments and biological substances status: Secondary | ICD-10-CM | POA: Diagnosis not present

## 2014-10-02 DIAGNOSIS — A318 Other mycobacterial infections: Secondary | ICD-10-CM | POA: Diagnosis present

## 2014-10-02 HISTORY — PX: I&D EXTREMITY: SHX5045

## 2014-10-02 HISTORY — DX: Encounter for adjustment and management of vascular access device: Z45.2

## 2014-10-02 SURGERY — IRRIGATION AND DEBRIDEMENT EXTREMITY
Anesthesia: General | Site: Hand | Laterality: Left

## 2014-10-02 MED ORDER — HYDROCODONE-ACETAMINOPHEN 10-325 MG PO TABS
1.0000 | ORAL_TABLET | Freq: Once | ORAL | Status: AC
Start: 2014-10-02 — End: 2014-10-02
  Administered 2014-10-02: 1 via ORAL
  Filled 2014-10-02: qty 1

## 2014-10-02 MED ORDER — BUPIVACAINE HCL (PF) 0.25 % IJ SOLN
INTRAMUSCULAR | Status: AC
  Filled 2014-10-02: qty 30

## 2014-10-02 MED ORDER — FENTANYL CITRATE (PF) 100 MCG/2ML IJ SOLN
INTRAMUSCULAR | Status: AC
  Filled 2014-10-02: qty 2

## 2014-10-02 MED ORDER — MIDAZOLAM HCL 2 MG/2ML IJ SOLN
INTRAMUSCULAR | Status: AC
  Filled 2014-10-02: qty 2

## 2014-10-02 MED ORDER — PROPOFOL 10 MG/ML IV BOLUS
INTRAVENOUS | Status: DC | PRN
  Administered 2014-10-02: 120 mg via INTRAVENOUS

## 2014-10-02 MED ORDER — FENTANYL CITRATE (PF) 100 MCG/2ML IJ SOLN
INTRAMUSCULAR | Status: AC
  Filled 2014-10-02: qty 4

## 2014-10-02 MED ORDER — GLYCOPYRROLATE 0.2 MG/ML IJ SOLN: 0.2000 mg | Freq: Once | INTRAMUSCULAR | Status: DC | PRN

## 2014-10-02 MED ORDER — CHLORHEXIDINE GLUCONATE 4 % EX LIQD: 60.0000 mL | Freq: Once | CUTANEOUS | Status: DC

## 2014-10-02 MED ORDER — DEXAMETHASONE SODIUM PHOSPHATE 4 MG/ML IJ SOLN
INTRAMUSCULAR | Status: DC | PRN
  Administered 2014-10-02: 10 mg via INTRAVENOUS

## 2014-10-02 MED ORDER — MIDAZOLAM HCL 2 MG/2ML IJ SOLN
1.0000 mg | INTRAMUSCULAR | Status: DC | PRN
  Administered 2014-10-02: 2 mg via INTRAVENOUS

## 2014-10-02 MED ORDER — FENTANYL CITRATE (PF) 100 MCG/2ML IJ SOLN
25.0000 ug | INTRAMUSCULAR | Status: DC | PRN
  Administered 2014-10-02 (×3): 50 ug via INTRAVENOUS

## 2014-10-02 MED ORDER — ONDANSETRON HCL 4 MG/2ML IJ SOLN
INTRAMUSCULAR | Status: DC | PRN
  Administered 2014-10-02: 4 mg via INTRAVENOUS

## 2014-10-02 MED ORDER — LIDOCAINE HCL (CARDIAC) 20 MG/ML IV SOLN
INTRAVENOUS | Status: DC | PRN
  Administered 2014-10-02: 50 mg via INTRAVENOUS

## 2014-10-02 MED ORDER — HYDROCODONE-ACETAMINOPHEN 10-325 MG PO TABS: 1.0000 | ORAL_TABLET | Freq: Four times a day (QID) | ORAL | Status: DC | PRN

## 2014-10-02 MED ORDER — LACTATED RINGERS IV SOLN
INTRAVENOUS | Status: DC
  Administered 2014-10-02: 09:00:00 via INTRAVENOUS

## 2014-10-02 MED ORDER — FENTANYL CITRATE (PF) 100 MCG/2ML IJ SOLN
INTRAMUSCULAR | Status: DC | PRN
  Administered 2014-10-02 (×2): 50 ug via INTRAVENOUS

## 2014-10-02 MED ORDER — PROMETHAZINE HCL 25 MG/ML IJ SOLN
6.2500 mg | INTRAMUSCULAR | Status: DC | PRN
Start: 2014-10-02 — End: 2014-10-02

## 2014-10-02 MED ORDER — BUPIVACAINE HCL (PF) 0.25 % IJ SOLN
INTRAMUSCULAR | Status: DC | PRN
  Administered 2014-10-02: 8 mL

## 2014-10-02 MED ORDER — FENTANYL CITRATE (PF) 100 MCG/2ML IJ SOLN: 50.0000 ug | INTRAMUSCULAR | Status: DC | PRN

## 2014-10-02 SURGICAL SUPPLY — 53 items
BAG DECANTER FOR FLEXI CONT (MISCELLANEOUS) IMPLANT
BLADE MINI RND TIP GREEN BEAV (BLADE) IMPLANT
BLADE SURG 15 STRL LF DISP TIS (BLADE) ×1 IMPLANT
BLADE SURG 15 STRL SS (BLADE) ×3
BNDG CMPR 9X4 STRL LF SNTH (GAUZE/BANDAGES/DRESSINGS)
BNDG COHESIVE 1X5 TAN STRL LF (GAUZE/BANDAGES/DRESSINGS) IMPLANT
BNDG COHESIVE 3X5 TAN STRL LF (GAUZE/BANDAGES/DRESSINGS) ×3 IMPLANT
BNDG ESMARK 4X9 LF (GAUZE/BANDAGES/DRESSINGS) IMPLANT
BNDG GAUZE ELAST 4 BULKY (GAUZE/BANDAGES/DRESSINGS) ×2 IMPLANT
BRUSH SCRUB EZ PLAIN DRY (MISCELLANEOUS) ×2 IMPLANT
CHLORAPREP W/TINT 26ML (MISCELLANEOUS) IMPLANT
CORDS BIPOLAR (ELECTRODE) ×3 IMPLANT
COVER BACK TABLE 60X90IN (DRAPES) ×3 IMPLANT
COVER MAYO STAND STRL (DRAPES) ×3 IMPLANT
CUFF TOURNIQUET SINGLE 18IN (TOURNIQUET CUFF) IMPLANT
DRAPE EXTREMITY T 121X128X90 (DRAPE) ×3 IMPLANT
DRAPE SURG 17X23 STRL (DRAPES) ×3 IMPLANT
DRSG KUZMA FLUFF (GAUZE/BANDAGES/DRESSINGS) IMPLANT
GAUZE PACKING IODOFORM 1/4X15 (GAUZE/BANDAGES/DRESSINGS) ×2 IMPLANT
GAUZE SPONGE 4X4 12PLY STRL (GAUZE/BANDAGES/DRESSINGS) ×3 IMPLANT
GAUZE XEROFORM 1X8 LF (GAUZE/BANDAGES/DRESSINGS) ×1 IMPLANT
GLOVE BIOGEL PI IND STRL 7.0 (GLOVE) IMPLANT
GLOVE BIOGEL PI IND STRL 8.5 (GLOVE) ×1 IMPLANT
GLOVE BIOGEL PI INDICATOR 7.0 (GLOVE) ×4
GLOVE BIOGEL PI INDICATOR 8.5 (GLOVE) ×2
GLOVE ECLIPSE 6.5 STRL STRAW (GLOVE) ×2 IMPLANT
GLOVE SURG ORTHO 8.0 STRL STRW (GLOVE) ×3 IMPLANT
GOWN STRL REUS W/ TWL LRG LVL3 (GOWN DISPOSABLE) ×1 IMPLANT
GOWN STRL REUS W/TWL LRG LVL3 (GOWN DISPOSABLE) ×3
GOWN STRL REUS W/TWL XL LVL3 (GOWN DISPOSABLE) ×3 IMPLANT
LOOP VESSEL MAXI BLUE (MISCELLANEOUS) IMPLANT
NDL PRECISIONGLIDE 27X1.5 (NEEDLE) IMPLANT
NEEDLE PRECISIONGLIDE 27X1.5 (NEEDLE) ×3 IMPLANT
NS IRRIG 1000ML POUR BTL (IV SOLUTION) ×3 IMPLANT
PACK BASIN DAY SURGERY FS (CUSTOM PROCEDURE TRAY) ×3 IMPLANT
PAD CAST 3X4 CTTN HI CHSV (CAST SUPPLIES) ×1 IMPLANT
PADDING CAST ABS 4INX4YD NS (CAST SUPPLIES)
PADDING CAST ABS COTTON 4X4 ST (CAST SUPPLIES) IMPLANT
PADDING CAST COTTON 3X4 STRL (CAST SUPPLIES) ×3
SPLINT PLASTER CAST XFAST 3X15 (CAST SUPPLIES) ×5 IMPLANT
SPLINT PLASTER XTRA FASTSET 3X (CAST SUPPLIES) ×10
STOCKINETTE 4X48 STRL (DRAPES) ×3 IMPLANT
SWAB COLLECTION DEVICE MRSA (MISCELLANEOUS) IMPLANT
SYR BULB 3OZ (MISCELLANEOUS) ×3 IMPLANT
SYR CONTROL 10ML LL (SYRINGE) ×2 IMPLANT
TOWEL OR 17X24 6PK STRL BLUE (TOWEL DISPOSABLE) ×3 IMPLANT
TRAY DSU PREP LF (CUSTOM PROCEDURE TRAY) ×3 IMPLANT
TUBE ANAEROBIC SPECIMEN COL (MISCELLANEOUS) IMPLANT
TUBE CONNECTING 20'X1/4 (TUBING) ×1
TUBE CONNECTING 20X1/4 (TUBING) ×1 IMPLANT
TUBE FEEDING 5FR 15 INCH (TUBING) ×2 IMPLANT
UNDERPAD 30X30 (UNDERPADS AND DIAPERS) ×3 IMPLANT
YANKAUER SUCT BULB TIP NO VENT (SUCTIONS) ×3 IMPLANT

## 2014-10-02 NOTE — Discharge Instructions (Addendum)

## 2014-10-02 NOTE — Anesthesia Postprocedure Evaluation (Signed)
  Anesthesia Post-op Note  Patient: Lee Klein  Procedure(s) Performed: Procedure(s): IRRIGATION AND DEBRIDEMENT  LEFT HAND (Left)  Patient Location: PACU  Anesthesia Type:General  Level of Consciousness: awake and alert   Airway and Oxygen Therapy: Patient Spontanous Breathing  Post-op Pain: mild  Post-op Assessment: Post-op Vital signs reviewed  Post-op Vital Signs: Reviewed  Last Vitals:  Filed Vitals:   10/02/14 1130  BP: 162/91  Pulse: 64  Temp: 36.4 C  Resp: 16    Complications: No apparent anesthesia complications

## 2014-10-02 NOTE — Op Note (Signed)
Dictation Number [id number]

## 2014-10-02 NOTE — Anesthesia Preprocedure Evaluation (Addendum)
Anesthesia Evaluation  Patient identified by MRN, date of birth, ID band Patient awake    Reviewed: Allergy & Precautions, NPO status , Patient's Chart, lab work & pertinent test results  Airway Mallampati: I  TM Distance: >3 FB Neck ROM: Full    Dental  (+) Teeth Intact, Dental Advisory Given   Pulmonary former smoker,  breath sounds clear to auscultation        Cardiovascular negative cardio ROS  Rhythm:Regular Rate:Normal     Neuro/Psych Anxiety negative neurological ROS     GI/Hepatic Neg liver ROS, GERD-  Medicated and Controlled,  Endo/Other  negative endocrine ROS  Renal/GU negative Renal ROS     Musculoskeletal   Abdominal   Peds  Hematology negative hematology ROS (+)   Anesthesia Other Findings   Reproductive/Obstetrics                            Anesthesia Physical  Anesthesia Plan  ASA: III  Anesthesia Plan: General   Post-op Pain Management:    Induction: Intravenous  Airway Management Planned: LMA  Additional Equipment:   Intra-op Plan:   Post-operative Plan: Extubation in OR  Informed Consent: I have reviewed the patients History and Physical, chart, labs and discussed the procedure including the risks, benefits and alternatives for the proposed anesthesia with the patient or authorized representative who has indicated his/her understanding and acceptance.   Dental advisory given  Plan Discussed with: CRNA, Anesthesiologist and Surgeon  Anesthesia Plan Comments:         Anesthesia Quick Evaluation

## 2014-10-02 NOTE — Anesthesia Procedure Notes (Signed)
Procedure Name: LMA Insertion Date/Time: 10/02/2014 9:33 AM Performed by: Gar Gibbon Pre-anesthesia Checklist: Patient identified, Emergency Drugs available, Suction available and Patient being monitored Patient Re-evaluated:Patient Re-evaluated prior to inductionOxygen Delivery Method: Circle System Utilized Preoxygenation: Pre-oxygenation with 100% oxygen Intubation Type: IV induction Ventilation: Mask ventilation without difficulty LMA: LMA inserted LMA Size: 5.0 Number of attempts: 1 Airway Equipment and Method: Bite block Placement Confirmation: positive ETCO2 Tube secured with: Tape Dental Injury: Teeth and Oropharynx as per pre-operative assessment

## 2014-10-02 NOTE — Brief Op Note (Signed)
10/02/2014  10:08 AM  PATIENT:  Lee Klein  64 y.o. male  PRE-OPERATIVE DIAGNOSIS:  INFECTED LEFT HAND   POST-OPERATIVE DIAGNOSIS:  INFECTED LEFT HAND   PROCEDURE:  Procedure(s): IRRIGATION AND DEBRIDEMENT  LEFT HAND (Left)  SURGEON:  Surgeon(s) and Role:    * Cindee Salt, MD - Primary  PHYSICIAN ASSISTANT:   ASSISTANTS: none   ANESTHESIA:   local and general  EBL:   none  BLOOD ADMINISTERED:none  DRAINS: iodoform packing left hand  LOCAL MEDICATIONS USED:  BUPIVICAINE   SPECIMEN:  No Specimen  DISPOSITION OF SPECIMEN:  N/A  COUNTS:  YES  TOURNIQUET:   Total Tourniquet Time Documented: Upper Arm (Left) - 14 minutes Total: Upper Arm (Left) - 14 minutes   DICTATION: .Other Dictation: Dictation Number [id number]  PLAN OF CARE: Discharge to home after PACU  PATIENT DISPOSITION:  PACU - hemodynamically stable.

## 2014-10-02 NOTE — H&P (Signed)
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 He has undergone multiple I&D's. He is being followed by infectious disease and is receiving appropriate antibiotics but has developed a new abscess on his volar left wrist and palm. X-rays reveal osteo of his distal radius. ALLERGIES:  Demerol, IVP dye.  MEDICATIONS:   Imuran, Protonix, vitamins.  SURGICAL HISTORY:  Hernia repair and (7) colon operations.  FAMILY MEDICAL HISTORY:   Positive for arthritis otherwise negative.  SOCIAL HISTORY:  He does not smoke or drink.  He is married.  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    REVIEW OF SYSTEMS:   Positive for glasses, Crohn's disease, otherwise negative 14 points.  Lee Klein is an 64 y.o. male.   Chief Complaint: abcess left hand HPI: see above  Past Medical History  Diagnosis Date  . Crohn's disease   . Hernia, abdominal     "repaired 10/2013"  . GERD (gastroesophageal reflux disease)   . Anxiety   . 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 Kidney stones     "passed them"  . PICC (peripherally inserted central catheter) in place     right arm for home antibiotics    Past Surgical History  Procedure Laterality Date  . Ileostomy  06/2010; 10/2013  . Colonoscopy    . Upper gastrointestinal endoscopy    . Appendectomy  ~ 2009  . Ileostomy closure N/A 10/23/2013    Procedure: ILEOSTOMY TAKEDOWN, REPAIR OF PARASTOMAL HERNIA.;  Surgeon: Cherylynn Ridges, MD;  [facility];  Service: General;  Laterality: N/A;  . Wrist arthroscopy with debridement Left 05/24/2014    Procedure: LEFT ARTHROSCOPY WRIST CULTURE/BIOPSY DEBRIDEMENT;  Surgeon: Cindee Salt, MD;  Location: [facility];  Service: Orthopedics;  Laterality: Left;  . Wrist fracture surgery Right 06/2006    [name] 09/29/2010  . Esophagogastroduodenoscopy N/A 06/15/2014    Procedure: ESOPHAGOGASTRODUODENOSCOPY (EGD);   Surgeon: Malissa Hippo, MD;  [facility];  Service: Endoscopy;  Laterality: N/A;  . Cholecystectomy  ~ 2008  . Tonsillectomy  1963  . Fracture surgery    . Hernia repair  10/2013    parastomal/notes 10/03/2013  . Colon surgery  ~ 2010    for a colonic stricture at the anastomosis from a colostomy reversal/notes 10/03/2013  . Parastomal hernia repair  10/2013  . Incision and drainage abscess Left 06/22/2014    Procedure: INCISION, DEBRIDEMENT AND IRRIGATION LEFT HAND/WRIST;  Surgeon: Cindee Salt, MD;  Location: [facility];  Service: Orthopedics;  Laterality: Left;  . Incision and drainage of wound Left 07/27/2014    Procedure: IRRIGATION AND DEBRIDEMENT WOUND;  Surgeon: Cindee Salt, MD;  Location: [facility];  Service: Orthopedics;  Laterality: Left;  . Incision and drainage abscess Left 08/09/2014    Procedure: INCISION AND DRAINAGE LEFT WRIST;  Surgeon: Cindee Salt, MD;  Location: [facility];  Service: Orthopedics;  Laterality: Left;  . Incision and drainage abscess Left 09/05/2014    Procedure: INCISION AND DRAINAGE LEFT HAND;  Surgeon: Cindee Salt, MD;  Location: [facility];  Service: Orthopedics;  Laterality: Left;    Family History  Problem Relation Age of Onset  . Healthy Mother   . Healthy Daughter   . Healthy Son    Social History:  reports that he quit smoking about 8 years ago. His  smoking use included Cigars. He has never used smokeless tobacco. He reports that he does not drink alcohol or use illicit drugs.  Allergies:  Allergies  Allergen Reactions  . Ivp Dye [Iodinated Diagnostic Agents]     Joint pain, nausea  . Fish Allergy Nausea And Vomiting  . Rifampin Nausea And Vomiting and Other (See Comments)    Chills  . Shellfish Allergy Swelling  . Demerol Rash    No prescriptions prior to admission    No results found for this or any previous visit (from the past 48 hour(s)).  No results  found.   Pertinent items are noted in HPI.  Height 5\' 10"  (1.778 m), weight 72.576 kg (160 lb).  General appearance: alert, cooperative and appears stated age Head: Normocephalic, without obvious abnormality Neck: no JVD Resp: clear to auscultation bilaterally Cardio: regular rate and rhythm, S1, S2 normal, no murmur, click, rub or gallop GI: soft, non-tender; bowel sounds normal; no masses,  no organomegaly Extremities: swelling left hand and wrist Pulses: 2+ and symmetric Skin: Skin color, texture, turgor normal. No rashes or lesions Neurologic: Grossly normal Incision/Wound: Draining abcess left hand  Assessment/Plan PLAN:   I&D, this will be packed open.  He and his wife are aware that this may well develop into sinus tracts which may take a considerable period of time to close.  This is scheduled as an outpatient under anesthesia.  Sukhmani Fetherolf R 10/02/2014, 6:45 AM

## 2014-10-02 NOTE — Transfer of Care (Signed)
Immediate Anesthesia Transfer of Care Note  Patient: Lee Klein  Procedure(s) Performed: Procedure(s): IRRIGATION AND DEBRIDEMENT  LEFT HAND (Left)  Patient Location: PACU  Anesthesia Type:General  Level of Consciousness: sedated and patient cooperative  Airway & Oxygen Therapy: Patient Spontanous Breathing and Patient connected to face mask oxygen  Post-op Assessment: Report given to RN and Post -op Vital signs reviewed and stable  Post vital signs: Reviewed and stable  Last Vitals:  Filed Vitals:   10/02/14 0822  BP: 158/86  Pulse: 74  Temp: 36.5 C  Resp: 20    Complications: No apparent anesthesia complications

## 2014-10-03 ENCOUNTER — Encounter (HOSPITAL_BASED_OUTPATIENT_CLINIC_OR_DEPARTMENT_OTHER): Payer: Self-pay | Admitting: Orthopedic Surgery

## 2014-10-03 NOTE — Op Note (Signed)
NAMEGREGARY, Lee Klein                ACCOUNT NO.:  [id number]  MEDICAL RECORD NO.:  [id number]  LOCATION:                                 FACILITY:  PHYSICIAN:  Cindee Salt, M.D.       DATE OF BIRTH:  21-Aug-1950  DATE OF PROCEDURE:  10/02/2014 DATE OF DISCHARGE:                              OPERATIVE REPORT   PREOPERATIVE DIAGNOSIS:  Atypical mycobacterial infection, left hand.  POSTOPERATIVE DIAGNOSIS:  Atypical mycobacterial infection, left hand.  OPERATION:  Scaphoid incision and drainage of osteomyelitis of his index metacarpal, left hand.  SURGEON:  Cindee Salt, MD.  ANESTHESIA:  General with local infiltration.  ANESTHESIOLOGIST:  Zenon Mayo, MD  HISTORY:  The patient is a 64 year old male with history of ulcerative colitis, was going to be placed on Enbrel, was referred for swelling of his wrist, to be certain infection was not present. The swelling had gone on for 8 months.  He had punched out lesions in multiple metacarpal bones, distal radius, distal ulnar.  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 He has been treated with Infectious Disease and multiple incision and drainages.  He is admitted now with swelling of his volar and radial aspect of his wrist and swelling of the metacarpophalangeal joint, volar aspect of his index finger.  He is aware of risks and complications having undergone this multiple times in the past.  PROCEDURE IN DETAIL:  The patient was brought to the operating room where general anesthetic was carried out without difficulty.  He was prepped with Betadine scrub and solution with the left arm free.  The limb was elevated for exsanguination and tourniquet placed on the upper arm inflated to 250 mmHg after taking a time-out confirming the patient and procedure.  A longitudinal incision was made over the radial aspect of his wrist.  This was directly over the flexor carpi  radialis tendon. Pus was immediately encountered.  This was followed down to the distal pole of the scaphoid, which was opened and debrided with rongeur and curettes.  Specimen was not sent to pathology, and this has been cultured on multiple occasions. This was copiously irrigated with saline.  A separate incision was then made over the volar aspect of metacarpophalangeal joint, radial side of his index finger, carried down through subcutaneous tissue.  Neurovascular structures are protected. Pus was immediately encountered directly under the skin. This was followed down into the metacarpal head.  This was treated with irrigation, and a curette was used to remove that bone. The area was copiously irrigated with an infant feeding tube, and each of the wounds was then packed open along with the 2 open areas on the dorsal aspect of his hand. A sterile compressive dressing with volar splint was applied. On deflation of the tourniquet, all fingers immediately pinked.  Local infiltration was given with 0.25% bupivacaine without epinephrine, approximately 8 mL were used.  The patient tolerated the procedure well and was taken to the recovery room for observation in satisfactory condition.  He will be discharged home to return in 3 days on Vicodin. He is obtaining antibiotics through Infectious Disease.  ______________________________ Cindee Salt, M.D.     GK/MEDQ  D:  10/02/2014  T:  10/03/2014  Job:  [id number]

## 2014-10-05 ENCOUNTER — Encounter: Payer: Self-pay | Admitting: Infectious Disease

## 2014-10-08 ENCOUNTER — Encounter: Payer: Self-pay | Admitting: Infectious Disease

## 2014-10-12 ENCOUNTER — Telehealth: Payer: Self-pay | Admitting: *Deleted

## 2014-10-12 ENCOUNTER — Encounter (HOSPITAL_BASED_OUTPATIENT_CLINIC_OR_DEPARTMENT_OTHER)
Admission: RE | Disposition: A | Discharge status: Home / Self Care | Payer: Self-pay | Source: Ambulatory Visit | Attending: Orthopedic Surgery

## 2014-10-12 ENCOUNTER — Ambulatory Visit (HOSPITAL_BASED_OUTPATIENT_CLINIC_OR_DEPARTMENT_OTHER)
Admission: RE | Admit: 2014-10-12 | Discharge: 2014-10-12 | Disposition: A | Discharge status: Home / Self Care | Payer: Managed Care, Other (non HMO) | Source: Ambulatory Visit | Attending: Orthopedic Surgery | Admitting: Orthopedic Surgery

## 2014-10-12 ENCOUNTER — Encounter (HOSPITAL_BASED_OUTPATIENT_CLINIC_OR_DEPARTMENT_OTHER): Payer: Self-pay | Admitting: Orthopedic Surgery

## 2014-10-12 ENCOUNTER — Other Ambulatory Visit: Payer: Self-pay | Admitting: Orthopedic Surgery

## 2014-10-12 DIAGNOSIS — L02512 Cutaneous abscess of left hand: Secondary | ICD-10-CM | POA: Insufficient documentation

## 2014-10-12 DIAGNOSIS — Z87891 Personal history of nicotine dependence: Secondary | ICD-10-CM | POA: Diagnosis not present

## 2014-10-12 DIAGNOSIS — Z885 Allergy status to narcotic agent status: Secondary | ICD-10-CM | POA: Insufficient documentation

## 2014-10-12 DIAGNOSIS — M868X8 Other osteomyelitis, other site: Secondary | ICD-10-CM | POA: Insufficient documentation

## 2014-10-12 HISTORY — PX: I&D EXTREMITY: SHX5045

## 2014-10-12 SURGERY — MINOR IRRIGATION AND DEBRIDEMENT EXTREMITY
Anesthesia: LOCAL | Site: Hand | Laterality: Left

## 2014-10-12 MED ORDER — CHLORHEXIDINE GLUCONATE 4 % EX LIQD
60.0000 mL | Freq: Once | CUTANEOUS | Status: DC
Start: 2014-10-12 — End: 2014-10-12

## 2014-10-12 MED ORDER — LIDOCAINE HCL 1 % IJ SOLN
INTRAMUSCULAR | Status: DC | PRN
  Administered 2014-10-12: 5 mL via INTRAMUSCULAR

## 2014-10-12 MED ORDER — CHLORHEXIDINE GLUCONATE 4 % EX LIQD: 60.0000 mL | Freq: Once | CUTANEOUS | Status: DC

## 2014-10-12 MED ORDER — BUPIVACAINE HCL (PF) 0.25 % IJ SOLN
INTRAMUSCULAR | Status: AC
Start: 2014-10-12 — End: 2014-10-12
  Filled 2014-10-12: qty 30

## 2014-10-12 MED ORDER — HYDROCODONE-ACETAMINOPHEN 10-325 MG PO TABS: 1.0000 | ORAL_TABLET | Freq: Four times a day (QID) | ORAL | Status: DC | PRN

## 2014-10-12 SURGICAL SUPPLY — 49 items
BANDAGE COBAN STERILE 2 (GAUZE/BANDAGES/DRESSINGS) IMPLANT
BLADE MINI RND TIP GREEN BEAV (BLADE) IMPLANT
BLADE SURG 15 STRL LF DISP TIS (BLADE) ×2 IMPLANT
BLADE SURG 15 STRL SS (BLADE) ×2
BNDG CMPR 9X4 STRL LF SNTH (GAUZE/BANDAGES/DRESSINGS)
BNDG CMPR MD 5X2 ELC HKLP STRL (GAUZE/BANDAGES/DRESSINGS)
BNDG COHESIVE 1X5 TAN STRL LF (GAUZE/BANDAGES/DRESSINGS) IMPLANT
BNDG COHESIVE 3X5 TAN STRL LF (GAUZE/BANDAGES/DRESSINGS) ×1 IMPLANT
BNDG CONFORM 2 STRL LF (GAUZE/BANDAGES/DRESSINGS) IMPLANT
BNDG ELASTIC 2 VLCR STRL LF (GAUZE/BANDAGES/DRESSINGS) IMPLANT
BNDG ESMARK 4X9 LF (GAUZE/BANDAGES/DRESSINGS) IMPLANT
BNDG GAUZE ELAST 4 BULKY (GAUZE/BANDAGES/DRESSINGS) ×1 IMPLANT
CHLORAPREP W/TINT 26ML (MISCELLANEOUS) ×1 IMPLANT
CORDS BIPOLAR (ELECTRODE) ×1 IMPLANT
COVER BACK TABLE 60X90IN (DRAPES) ×1 IMPLANT
COVER MAYO STAND STRL (DRAPES) ×2 IMPLANT
CUFF TOURNIQUET SINGLE 18IN (TOURNIQUET CUFF) ×2 IMPLANT
DRAIN PENROSE 1/2X12 LTX STRL (WOUND CARE) IMPLANT
DRAIN PENROSE 1/4X12 LTX STRL (WOUND CARE) IMPLANT
DRAPE EXTREMITY T 121X128X90 (DRAPE) ×2 IMPLANT
DRAPE SURG 17X23 STRL (DRAPES) ×2 IMPLANT
GAUZE PACKING IODOFORM 1/4X15 (GAUZE/BANDAGES/DRESSINGS) ×1 IMPLANT
GAUZE SPONGE 4X4 12PLY STRL (GAUZE/BANDAGES/DRESSINGS) ×2 IMPLANT
GAUZE XEROFORM 1X8 LF (GAUZE/BANDAGES/DRESSINGS) ×1 IMPLANT
GLOVE BIO SURGEON STRL SZ7.5 (GLOVE) ×1 IMPLANT
GLOVE BIOGEL M 7.0 STRL (GLOVE) ×1 IMPLANT
GLOVE BIOGEL PI IND STRL 8 (GLOVE) ×1 IMPLANT
GLOVE BIOGEL PI INDICATOR 8 (GLOVE) ×2
GLOVE EXAM NITRILE MD LF STRL (GLOVE) ×1 IMPLANT
GLOVE SURG ORTHO 8.0 STRL STRW (GLOVE) ×2 IMPLANT
GOWN STRL REUS W/ TWL LRG LVL3 (GOWN DISPOSABLE) IMPLANT
GOWN STRL REUS W/ TWL XL LVL3 (GOWN DISPOSABLE) IMPLANT
GOWN STRL REUS W/TWL LRG LVL3 (GOWN DISPOSABLE)
GOWN STRL REUS W/TWL XL LVL3 (GOWN DISPOSABLE) ×2
NDL HYPO 25X1 1.5 SAFETY (NEEDLE) ×1 IMPLANT
NEEDLE HYPO 25X1 1.5 SAFETY (NEEDLE) ×2 IMPLANT
NS IRRIG 1000ML POUR BTL (IV SOLUTION) ×2 IMPLANT
PACK BASIN DAY SURGERY FS (CUSTOM PROCEDURE TRAY) ×1 IMPLANT
PADDING CAST ABS 4INX4YD NS (CAST SUPPLIES) ×1
PADDING CAST ABS COTTON 4X4 ST (CAST SUPPLIES) ×1 IMPLANT
STOCKINETTE 4X48 STRL (DRAPES) ×2 IMPLANT
SUT ETHILON 4 0 PS 2 18 (SUTURE) IMPLANT
SWAB COLLECTION DEVICE MRSA (MISCELLANEOUS) ×2 IMPLANT
SYR BULB 3OZ (MISCELLANEOUS) ×1 IMPLANT
SYR CONTROL 10ML LL (SYRINGE) ×2 IMPLANT
TOWEL OR 17X24 6PK STRL BLUE (TOWEL DISPOSABLE) ×3 IMPLANT
TRAY DSU PREP LF (CUSTOM PROCEDURE TRAY) ×1 IMPLANT
TUBE ANAEROBIC SPECIMEN COL (MISCELLANEOUS) ×2 IMPLANT
UNDERPAD 30X30 (UNDERPADS AND DIAPERS) ×2 IMPLANT

## 2014-10-12 NOTE — Discharge Instructions (Signed)

## 2014-10-12 NOTE — Op Note (Signed)
Dictation Number [id number]

## 2014-10-12 NOTE — H&P (View-Only) (Signed)
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 He has undergone multiple I&D's. He is being followed by infectious disease and is receiving appropriate antibiotics but has developed a new abscess on his volar left wrist and palm. X-rays reveal osteo of his distal radius. ALLERGIES:  Demerol, IVP dye.  MEDICATIONS:   Imuran, Protonix, vitamins.  SURGICAL HISTORY:  Hernia repair and (7) colon operations.  FAMILY MEDICAL HISTORY:   Positive for arthritis otherwise negative.  SOCIAL HISTORY:  He does not smoke or drink.  He is married.  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    REVIEW OF SYSTEMS:   Positive for glasses, Crohn's disease, otherwise negative 14 points.  Lee Klein is an 64 y.o. male.   Chief Complaint: abcess left hand HPI: see above  Past Medical History  Diagnosis Date  . Crohn's disease   . Hernia, abdominal     "repaired 10/2013"  . GERD (gastroesophageal reflux disease)   . Anxiety   . 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 Kidney stones     "passed them"  . PICC (peripherally inserted central catheter) in place     right arm for home antibiotics    Past Surgical History  Procedure Laterality Date  . Ileostomy  06/2010; 10/2013  . Colonoscopy    . Upper gastrointestinal endoscopy    . Appendectomy  ~ 2009  . Ileostomy closure N/A 10/23/2013    Procedure: ILEOSTOMY TAKEDOWN, REPAIR OF PARASTOMAL HERNIA.;  Surgeon: James O Wyatt, MD;  Location[facility];  Service: General;  Laterality: N/A;  . Wrist arthroscopy with debridement Left 05/24/2014    Procedure: LEFT ARTHROSCOPY WRIST CULTURE/BIOPSY DEBRIDEMENT;  Surgeon: Amair Shrout, MD;  Location: [facility];  Service: Orthopedics;  Laterality: Left;  . Wrist fracture surgery Right 06/2006    /notes 09/29/2010  . Esophagogastroduodenoscopy N/A 06/15/2014    Procedure: ESOPHAGOGASTRODUODENOSCOPY (EGD);   Surgeon: Najeeb U Rehman, MD;  [facility];  Service: Endoscopy;  Laterality: N/A;  . Cholecystectomy  ~ 2008  . Tonsillectomy  1963  . Fracture surgery    . Hernia repair  10/2013    parastomal/notes 10/03/2013  . Colon surgery  ~ 2010    for a colonic stricture at the anastomosis from a colostomy reversal/notes 10/03/2013  . Parastomal hernia repair  10/2013  . Incision and drainage abscess Left 06/22/2014    Procedure: INCISION, DEBRIDEMENT AND IRRIGATION LEFT HAND/WRIST;  Surgeon: Desman Polak, MD;  Location: [facility];  Service: Orthopedics;  Laterality: Left;  . Incision and drainage of wound Left 07/27/2014    Procedure: IRRIGATION AND DEBRIDEMENT WOUND;  Surgeon: Goodwin Kamphaus, MD;  Location: [facility];  Service: Orthopedics;  Laterality: Left;  . Incision and drainage abscess Left 08/09/2014    Procedure: INCISION AND DRAINAGE LEFT WRIST;  Surgeon: Kaydyn Sayas, MD;  Location: [facility];  Service: Orthopedics;  Laterality: Left;  . Incision and drainage abscess Left 09/05/2014    Procedure: INCISION AND DRAINAGE LEFT HAND;  Surgeon: Yamato Kopf, MD;  Location: [facility];  Service: Orthopedics;  Laterality: Left;    Family History  Problem Relation Age of Onset  . Healthy Mother   . Healthy Daughter   . Healthy Son    Social History:  reports that he quit smoking about 8 years ago. His   smoking use included Cigars. He has never used smokeless tobacco. He reports that he does not drink alcohol or use illicit drugs.  Allergies:  Allergies  Allergen Reactions  . Ivp Dye [Iodinated Diagnostic Agents]     Joint pain, nausea  . Fish Allergy Nausea And Vomiting  . Rifampin Nausea And Vomiting and Other (See Comments)    Chills  . Shellfish Allergy Swelling  . Demerol Rash    No prescriptions prior to admission    No results found for this or any previous visit (from the past 48 hour(s)).  No results  found.   Pertinent items are noted in HPI.  Height 5' 10" (1.778 m), weight 72.576 kg (160 lb).  General appearance: alert, cooperative and appears stated age Head: Normocephalic, without obvious abnormality Neck: no JVD Resp: clear to auscultation bilaterally Cardio: regular rate and rhythm, S1, S2 normal, no murmur, click, rub or gallop GI: soft, non-tender; bowel sounds normal; no masses,  no organomegaly Extremities: swelling left hand and wrist Pulses: 2+ and symmetric Skin: Skin color, texture, turgor normal. No rashes or lesions Neurologic: Grossly normal Incision/Wound: Draining abcess left hand  Assessment/Plan PLAN:   I&D, this will be packed open.  He and his wife are aware that this may well develop into sinus tracts which may take a considerable period of time to close.  This is scheduled as an outpatient under anesthesia.  Gina Leblond R 10/02/2014, 6:45 AM     

## 2014-10-12 NOTE — Telephone Encounter (Signed)
Amy at [facility] called. Patient's serum creatinine increased (0.67 on 5/16 --> 1.18 on 5/25), BUN increased (14 on 5/16 --> 21 on 5/25), WBC has doubled to 12.3.  The patient has experienced a sudden/dramatic change in hearing (hard of hearing at baseline, woke up this week with extreme difficulty hearing).  He went to his PCP who sent him to Dr. Pollyann Kennedy, Audiology.  Per Dr. Pollyann Kennedy, the changes in hearing are from the amikacin.   [facility] needs orders/advice on continuing the patient's amikacin due to changes in kidney function and hearing.  The nurse is going to his house tonight to draw a amikacin trough, it will be sent out for processing.  Wife will increase fluids.  [facility] is keeping dosing as-is until they hear from [facility].  Dr Daiva Eves is out of the office until Tuesday, will cc Dr. Luciana Axe and [facility].  Please advise.  Andree Coss, RN

## 2014-10-12 NOTE — Telephone Encounter (Signed)
I told her to stop amikacin and defer further treatment to [facility] next week. thanks

## 2014-10-12 NOTE — Brief Op Note (Signed)
10/12/2014  4:10 PM  PATIENT:  Lee Klein  64 y.o. male  PRE-OPERATIVE DIAGNOSIS:  Left  hand infection   POST-OPERATIVE DIAGNOSIS:  * No post-op diagnosis entered *  PROCEDURE:  Procedure(s): MINOR IRRIGATION AND DEBRIDEMENT LEFT HAND (Left)  SURGEON:  Surgeon(s) and Role:    * Cindee Salt, MD - Primary    * Betha Loa, MD - Primary  PHYSICIAN ASSISTANT:   ASSISTANTS: none   ANESTHESIA:   local  EBL:     BLOOD ADMINISTERED:none  DRAINS:packed  LOCAL MEDICATIONS USED:  BUPIVICAINE  and XYLOCAINE   SPECIMEN:  Source of Specimen:  cultures  DISPOSITION OF SPECIMEN:  PATHOLOGY  COUNTS:  YES  TOURNIQUET:   Total Tourniquet Time Documented: Forearm (Left) - 5 minutes Total: Forearm (Left) - 5 minutes   DICTATION: .Other Dictation: Dictation Number [id number]  PLAN OF CARE: Discharge to home after PACU  PATIENT DISPOSITION:  PACU - hemodynamically stable.

## 2014-10-12 NOTE — Interval H&P Note (Signed)
History and Physical Interval Note:  10/12/2014 3:30 PM  Lee Klein  has presented today for surgery, with the diagnosis of Left  hand infection   The various methods of treatment have been discussed with the patient and family. After consideration of risks, benefits and other options for treatment, the patient has consented to  Procedure(s): MINOR IRRIGATION AND DEBRIDEMENT LEFT HAND (Left) as a surgical intervention .  The patient's history has been reviewed, patient examined, no change in status, stable for surgery.  I have reviewed the patient's chart and labs.  Questions were answered to the patient's satisfaction.     Lee Klein   Lee Klein has developed swelling at the base of his index finger metacarpal PMH unchanged SH unchanged Ros ;Unchanged Meds unchanged Chest clear  Heart regular abd soft Hand swellinf left Plan. I&D left hand

## 2014-10-15 NOTE — Telephone Encounter (Signed)
We are going to have some significant trouble treating this guy with amikacin gone now and pending inability to get tygacil  We will need to revisit his sensis, likely restart continuous cefoxitin + azithro + tygacil for now and perhaps consider send to [facility] to get another M avium active drug?

## 2014-10-16 ENCOUNTER — Telehealth: Payer: Self-pay | Admitting: Licensed Clinical Social Worker

## 2014-10-16 ENCOUNTER — Encounter (HOSPITAL_BASED_OUTPATIENT_CLINIC_OR_DEPARTMENT_OTHER): Payer: Self-pay | Admitting: Orthopedic Surgery

## 2014-10-16 LAB — CULTURE, ROUTINE-ABSCESS: CULTURE: NO GROWTH

## 2014-10-16 NOTE — Op Note (Signed)
Lee Klein, Lee Klein                ACCOUNT NO.:  [id number]  MEDICAL RECORD NO.:  [id number]  LOCATION:                               FACILITY:  [facility]  PHYSICIAN:  Cindee Salt, M.D.       DATE OF BIRTH:  1951-05-08  DATE OF PROCEDURE:  10/12/2014 DATE OF DISCHARGE:  10/12/2014                              OPERATIVE REPORT   PREOPERATIVE DIAGNOSIS:  Abscess, left hand.  POSTOPERATIVE DIAGNOSIS:  Abscess, left hand.  OPERATION:  Incision and drainage, abscess with opening of metacarpal base, index finger, left hand for osteomyelitis.  SURGEON:  Cindee Salt, M.D.  ASSISTANT:  None.  ANESTHESIA:  Local.  HISTORY:  The patient is a 64 year old male with history of atypical Mycobacterium abscessus, this has been drained on multiple occasions due to multiple abscesses with osteomyelitis virtually all of his carpal bones, metacarpals, distal radius and distal ulna.  He has developed a new area of swelling and pain at the base of his index metacarpal.  X- rays revealed a probable area of osteomyelitis.  He was admitted for incision and drainage.  Pre, peri, and postoperative course have been discussed along with risks and complications, which he is well aware, having had multiple procedures done in the past.  PROCEDURE IN DETAIL:  The patient was brought to the operating room.  He was prepped using Betadine scrub and solution with the left arm free.  A local infiltration with 0.25% bupivacaine, 1% Xylocaine both without epinephrine was given over the area of pain and swelling.  A tourniquet placed on the upper arm was inflated to 250 mmHg.  Approximately, 5 mL of anesthetic were given.  After adequate anesthesia was afforded, longitudinal incision was made directly over the mass, carried down through the subcutaneous tissue.  Pus was immediately encountered, approximately 5 mL was removed.  Cultures were taken for both aerobic, anaerobic and atypical mycobacteria for sensitivity.  The  track was then followed down to the base of the metacarpal, which was opened, this was copiously irrigated with saline, bathed with Marcaine and Xylocaine. The wound was then packed opened.  A sterile compressive dressing was applied with the fingers free.  On deflation of the tourniquet, all fingers were immediately pinked.  He was taken to the recovery room for observation in satisfactory condition.  He will be discharged to home to return on Tuesday on hydrocodone.  His antibiotics are being taken care of by Infectious Disease.    ______________________________ Cindee Salt, M.D.   ______________________________ Cindee Salt, M.D.    GK/MEDQ  D:  10/12/2014  T:  10/13/2014  Job:  [id number]

## 2014-10-16 NOTE — Telephone Encounter (Signed)
Spoke to Amy at [facility] pharmacy, patient will need a double lumen PICC to receive Tygacil and Cefazolin together. Please advise

## 2014-10-16 NOTE — Telephone Encounter (Signed)
It should be Cefoxitin continuous infusion. We can have IR place a double lumen PICC and take out the old one

## 2014-10-17 ENCOUNTER — Other Ambulatory Visit: Payer: Self-pay | Admitting: Licensed Clinical Social Worker

## 2014-10-17 DIAGNOSIS — K859 Acute pancreatitis without necrosis or infection, unspecified: Secondary | ICD-10-CM

## 2014-10-17 DIAGNOSIS — M869 Osteomyelitis, unspecified: Secondary | ICD-10-CM

## 2014-10-17 HISTORY — DX: Acute pancreatitis without necrosis or infection, unspecified: K85.90

## 2014-10-17 LAB — ANAEROBIC CULTURE

## 2014-10-17 NOTE — Telephone Encounter (Signed)
Spoke with Victorino Dike in Interventional Radiology, she will schedule with the patient directly and call me to let me know when it will be so I can notify [facility].

## 2014-10-17 NOTE — Telephone Encounter (Signed)
Thanks Tamika. 

## 2014-10-19 ENCOUNTER — Ambulatory Visit (HOSPITAL_COMMUNITY)
Admission: RE | Admit: 2014-10-19 | Discharge: 2014-10-19 | Disposition: A | Discharge status: Home / Self Care | Payer: Managed Care, Other (non HMO) | Source: Ambulatory Visit | Attending: Infectious Disease | Admitting: Infectious Disease

## 2014-10-19 DIAGNOSIS — E1169 Type 2 diabetes mellitus with other specified complication: Secondary | ICD-10-CM | POA: Diagnosis not present

## 2014-10-19 DIAGNOSIS — M869 Osteomyelitis, unspecified: Secondary | ICD-10-CM | POA: Insufficient documentation

## 2014-10-19 MED ORDER — HEPARIN SOD (PORK) LOCK FLUSH 100 UNIT/ML IV SOLN
INTRAVENOUS | Status: AC
  Filled 2014-10-19: qty 10

## 2014-10-19 MED ORDER — CHLORHEXIDINE GLUCONATE 4 % EX LIQD
CUTANEOUS | Status: AC
  Filled 2014-10-19: qty 15

## 2014-10-19 MED ORDER — LIDOCAINE HCL 1 % IJ SOLN
INTRAMUSCULAR | Status: AC
  Filled 2014-10-19: qty 20

## 2014-10-19 NOTE — Procedures (Signed)
RUE PICC exchange 38 cm No comp

## 2014-10-23 ENCOUNTER — Telehealth: Payer: Self-pay | Admitting: Licensed Clinical Social Worker

## 2014-10-23 NOTE — Telephone Encounter (Signed)
Patient is having severe cramps, and no appetite when starting the Cefoxitin. Patient is tried yogurt to coat stomach, did not help. Wife stopped it last night but is still giving him Tygasil, patient is also experiencing edema in both legs which is better today and is going to his PCP for that. Patient feels much better since stopping Cefoxitin.  Please advise. Patient is getting depressed behind all the problems he is experiencing, and now unable to hear.  Patient

## 2014-10-23 NOTE — Telephone Encounter (Signed)
Patient cannot have amikacin due to his hearing loss  We are running out of tygacil as there will soon be a Scientist, clinical (histocompatibility and immunogenetics)  WE CANNOT TREAT this with ONLY AZITHROMYCIN that is simply NOT going to work  If we cannot come up with at LEAST a TWO DRUG regimen to treat him along with surgical management he MAY LOSE his hand and require amputation for cure   He can continue the azithromycin and tygacil for now but he needs to be seen in clinic by myself or ID clinic MD in next few weeks because we could run out of tygacil ANY DAy now!

## 2014-10-24 NOTE — Telephone Encounter (Signed)
-->   [facility] from [facility] called.  Last creatinine has improved (1.25 on 6/6).  She is looking for advice on cefoxitin. --> [facility] currently has 7 doses of tygacil on hand. They will order more today (have to state their need with each weekly request from the supplier, can only get small shipments at a time).  Lee Klein will let me know if the supplier will send more for the patient. --> Additionally, the amikacin trough (before the medication was stopped) was 0.8 - subtherapeutic, not toxic.    Patient's appointment is 6/22 with you.

## 2014-10-25 NOTE — Telephone Encounter (Signed)
Can you also let the pt know that [facility], Luiz Ochoa and I are trying to get him clofazipine from the [facility] on emergency need basis. This is an old antibiotic that his NTO commericially available but WOULD be likely active as another pill we could use. We have to ask permission from [facility] and paperwork being filled out today and tomorrow to submit for emergency IND

## 2014-10-25 NOTE — Telephone Encounter (Addendum)
Call from Amy at [facility] pharmacy.  [facility] has sufficient Tygacil stock for the pt to continue receiving at least until the pt;s OV appt 11/07/14.  [facility] is shipping medication to the patient today.

## 2014-10-29 NOTE — Telephone Encounter (Signed)
RN spoke with the pt's wife.  Shared Dr. Clinton Gallant note with her.  She and the patient are "in shock" about his hearing loss.  They are slowly working toward finding ways for him to communicate.  RN encouraged them to call the phone company for assistance is setting up equipment for the pt to use with his phone.  They have already changed one of their TVs to show subtitles.  RN advised the wife to call [facility] for further assistance.

## 2014-11-02 ENCOUNTER — Telehealth: Payer: Self-pay | Admitting: Infectious Disease

## 2014-11-02 NOTE — Telephone Encounter (Signed)
Lee Klein informed me that the patients Clofazimine is here from [facility]  Can we bring him in on Monday and give him his meds We can overbook him with me  He will have to sign some consent forms etc  Also his last labs showed elevation in his alk phosp and transaminases so I would like those labs when they are rechecked sent to [name] (or we can draw them on Monday)

## 2014-11-05 ENCOUNTER — Encounter (HOSPITAL_BASED_OUTPATIENT_CLINIC_OR_DEPARTMENT_OTHER): Payer: Self-pay | Admitting: *Deleted

## 2014-11-05 NOTE — Telephone Encounter (Signed)
Is he coming today to clinic or different day?  It may be sufficient for him to meet with [facility]

## 2014-11-05 NOTE — Telephone Encounter (Signed)
He has an appt with you on Wednesday of this week 6.22.16.

## 2014-11-05 NOTE — Telephone Encounter (Signed)
Ok very good.

## 2014-11-05 NOTE — Telephone Encounter (Signed)
[facility] notified to draw repeat labs. Wendall Mola

## 2014-11-06 ENCOUNTER — Encounter (HOSPITAL_BASED_OUTPATIENT_CLINIC_OR_DEPARTMENT_OTHER): Payer: Self-pay | Admitting: Anesthesiology

## 2014-11-06 ENCOUNTER — Encounter (HOSPITAL_COMMUNITY): Payer: Self-pay | Admitting: *Deleted

## 2014-11-06 ENCOUNTER — Telehealth: Payer: Self-pay | Admitting: Licensed Clinical Social Worker

## 2014-11-06 ENCOUNTER — Inpatient Hospital Stay (HOSPITAL_COMMUNITY)
Admission: EM | Admit: 2014-11-06 | Discharge: 2014-11-09 | DRG: 439 | Disposition: A | Discharge status: Home Health | Payer: Managed Care, Other (non HMO) | Attending: Family Medicine | Admitting: Family Medicine

## 2014-11-06 ENCOUNTER — Encounter (HOSPITAL_BASED_OUTPATIENT_CLINIC_OR_DEPARTMENT_OTHER): Admission: RE | Payer: Self-pay | Source: Ambulatory Visit

## 2014-11-06 ENCOUNTER — Emergency Department (HOSPITAL_COMMUNITY): Payer: Managed Care, Other (non HMO)

## 2014-11-06 ENCOUNTER — Ambulatory Visit (HOSPITAL_BASED_OUTPATIENT_CLINIC_OR_DEPARTMENT_OTHER)
Admission: RE | Admit: 2014-11-06 | Payer: Managed Care, Other (non HMO) | Source: Ambulatory Visit | Admitting: Orthopedic Surgery

## 2014-11-06 DIAGNOSIS — A319 Mycobacterial infection, unspecified: Secondary | ICD-10-CM | POA: Insufficient documentation

## 2014-11-06 DIAGNOSIS — J9811 Atelectasis: Secondary | ICD-10-CM | POA: Diagnosis present

## 2014-11-06 DIAGNOSIS — K435 Parastomal hernia without obstruction or  gangrene: Secondary | ICD-10-CM | POA: Diagnosis present

## 2014-11-06 DIAGNOSIS — F419 Anxiety disorder, unspecified: Secondary | ICD-10-CM | POA: Diagnosis present

## 2014-11-06 DIAGNOSIS — Z91041 Radiographic dye allergy status: Secondary | ICD-10-CM | POA: Diagnosis not present

## 2014-11-06 DIAGNOSIS — M86642 Other chronic osteomyelitis, left hand: Secondary | ICD-10-CM | POA: Diagnosis present

## 2014-11-06 DIAGNOSIS — R109 Unspecified abdominal pain: Secondary | ICD-10-CM | POA: Insufficient documentation

## 2014-11-06 DIAGNOSIS — Z885 Allergy status to narcotic agent status: Secondary | ICD-10-CM | POA: Diagnosis not present

## 2014-11-06 DIAGNOSIS — H9190 Unspecified hearing loss, unspecified ear: Secondary | ICD-10-CM | POA: Diagnosis present

## 2014-11-06 DIAGNOSIS — Z452 Encounter for adjustment and management of vascular access device: Secondary | ICD-10-CM | POA: Insufficient documentation

## 2014-11-06 DIAGNOSIS — Z87891 Personal history of nicotine dependence: Secondary | ICD-10-CM

## 2014-11-06 DIAGNOSIS — E2740 Unspecified adrenocortical insufficiency: Secondary | ICD-10-CM | POA: Diagnosis not present

## 2014-11-06 DIAGNOSIS — T365X5S Adverse effect of aminoglycosides, sequela: Secondary | ICD-10-CM | POA: Diagnosis not present

## 2014-11-06 DIAGNOSIS — Z6824 Body mass index (BMI) 24.0-24.9, adult: Secondary | ICD-10-CM | POA: Diagnosis not present

## 2014-11-06 DIAGNOSIS — M869 Osteomyelitis, unspecified: Secondary | ICD-10-CM | POA: Diagnosis not present

## 2014-11-06 DIAGNOSIS — K5000 Crohn's disease of small intestine without complications: Secondary | ICD-10-CM | POA: Diagnosis present

## 2014-11-06 DIAGNOSIS — Z91013 Allergy to seafood: Secondary | ICD-10-CM

## 2014-11-06 DIAGNOSIS — K859 Acute pancreatitis without necrosis or infection, unspecified: Secondary | ICD-10-CM | POA: Diagnosis present

## 2014-11-06 DIAGNOSIS — K219 Gastro-esophageal reflux disease without esophagitis: Secondary | ICD-10-CM | POA: Diagnosis present

## 2014-11-06 DIAGNOSIS — L02512 Cutaneous abscess of left hand: Secondary | ICD-10-CM | POA: Diagnosis present

## 2014-11-06 DIAGNOSIS — K853 Drug induced acute pancreatitis: Secondary | ICD-10-CM | POA: Diagnosis not present

## 2014-11-06 DIAGNOSIS — Z9049 Acquired absence of other specified parts of digestive tract: Secondary | ICD-10-CM | POA: Diagnosis present

## 2014-11-06 DIAGNOSIS — Z8719 Personal history of other diseases of the digestive system: Secondary | ICD-10-CM

## 2014-11-06 DIAGNOSIS — T380X5S Adverse effect of glucocorticoids and synthetic analogues, sequela: Secondary | ICD-10-CM | POA: Diagnosis not present

## 2014-11-06 DIAGNOSIS — Z79899 Other long term (current) drug therapy: Secondary | ICD-10-CM | POA: Diagnosis not present

## 2014-11-06 DIAGNOSIS — Z932 Ileostomy status: Secondary | ICD-10-CM

## 2014-11-06 DIAGNOSIS — E46 Unspecified protein-calorie malnutrition: Secondary | ICD-10-CM | POA: Diagnosis present

## 2014-11-06 DIAGNOSIS — Z881 Allergy status to other antibiotic agents status: Secondary | ICD-10-CM | POA: Diagnosis not present

## 2014-11-06 DIAGNOSIS — T364X5A Adverse effect of tetracyclines, initial encounter: Secondary | ICD-10-CM | POA: Diagnosis present

## 2014-11-06 DIAGNOSIS — A318 Other mycobacterial infections: Secondary | ICD-10-CM | POA: Diagnosis not present

## 2014-11-06 DIAGNOSIS — Z79891 Long term (current) use of opiate analgesic: Secondary | ICD-10-CM | POA: Diagnosis not present

## 2014-11-06 DIAGNOSIS — T361X5A Adverse effect of cephalosporins and other beta-lactam antibiotics, initial encounter: Secondary | ICD-10-CM | POA: Diagnosis present

## 2014-11-06 DIAGNOSIS — E273 Drug-induced adrenocortical insufficiency: Secondary | ICD-10-CM | POA: Diagnosis present

## 2014-11-06 DIAGNOSIS — R1013 Epigastric pain: Secondary | ICD-10-CM | POA: Diagnosis not present

## 2014-11-06 DIAGNOSIS — Z7952 Long term (current) use of systemic steroids: Secondary | ICD-10-CM

## 2014-11-06 DIAGNOSIS — Z792 Long term (current) use of antibiotics: Secondary | ICD-10-CM | POA: Diagnosis not present

## 2014-11-06 HISTORY — DX: Acute pancreatitis without necrosis or infection, unspecified: K85.90

## 2014-11-06 HISTORY — DX: Unspecified hearing loss, unspecified ear: H91.90

## 2014-11-06 HISTORY — DX: Personal history of other diseases of the digestive system: Z87.19

## 2014-11-06 HISTORY — DX: Other acute osteomyelitis, left hand: M86.142

## 2014-11-06 LAB — COMPREHENSIVE METABOLIC PANEL
ALBUMIN: 2.4 g/dL — AB (ref 3.5–5.0)
ALK PHOS: 147 U/L — AB (ref 38–126)
ALT: 53 U/L (ref 17–63)
AST: 39 U/L (ref 15–41)
Anion gap: 5 (ref 5–15)
BUN: 18 mg/dL (ref 6–20)
CO2: 23 mmol/L (ref 22–32)
Calcium: 8.3 mg/dL — ABNORMAL LOW (ref 8.9–10.3)
Chloride: 113 mmol/L — ABNORMAL HIGH (ref 101–111)
Creatinine, Ser: 0.86 mg/dL (ref 0.61–1.24)
GFR calc non Af Amer: >60 mL/min (ref >60)
Glucose, Bld: 75 mg/dL (ref 65–99)
POTASSIUM: 4 mmol/L (ref 3.5–5.1)
SODIUM: 141 mmol/L (ref 135–145)
TOTAL PROTEIN: 4.9 g/dL — AB (ref 6.5–8.1)
Total Bilirubin: 0.7 mg/dL (ref 0.3–1.2)

## 2014-11-06 LAB — URINE MICROSCOPIC-ADD ON

## 2014-11-06 LAB — CBC WITH DIFFERENTIAL/PLATELET
BASOS PCT: 0 % (ref 0–1)
Basophils Absolute: 0 10*3/uL (ref 0.0–0.1)
EOS ABS: 0 10*3/uL (ref 0.0–0.7)
Eosinophils Relative: 0 % (ref 0–5)
HCT: 35.3 % — ABNORMAL LOW (ref 39.0–52.0)
Hemoglobin: 11.7 g/dL — ABNORMAL LOW (ref 13.0–17.0)
Lymphocytes Relative: 13 % (ref 12–46)
Lymphs Abs: 1.8 10*3/uL (ref 0.7–4.0)
MCH: 32.8 pg (ref 26.0–34.0)
MCHC: 33.1 g/dL (ref 30.0–36.0)
MCV: 98.9 fL (ref 78.0–100.0)
Monocytes Absolute: 1.2 10*3/uL — ABNORMAL HIGH (ref 0.1–1.0)
Monocytes Relative: 9 % (ref 3–12)
NEUTROS PCT: 78 % — AB (ref 43–77)
Neutro Abs: 10.7 10*3/uL — ABNORMAL HIGH (ref 1.7–7.7)
PLATELETS: 165 10*3/uL (ref 150–400)
RBC: 3.57 MIL/uL — ABNORMAL LOW (ref 4.22–5.81)
RDW: 16.2 % — ABNORMAL HIGH (ref 11.5–15.5)
WBC: 13.7 10*3/uL — ABNORMAL HIGH (ref 4.0–10.5)

## 2014-11-06 LAB — URINALYSIS, ROUTINE W REFLEX MICROSCOPIC
BILIRUBIN URINE: NEGATIVE
Glucose, UA: NEGATIVE mg/dL
HGB URINE DIPSTICK: NEGATIVE
Ketones, ur: NEGATIVE mg/dL
Nitrite: NEGATIVE
PROTEIN: NEGATIVE mg/dL
Specific Gravity, Urine: 1.02 (ref 1.005–1.030)
Urobilinogen, UA: 0.2 mg/dL (ref 0.0–1.0)
pH: 5 (ref 5.0–8.0)

## 2014-11-06 LAB — LIPASE, BLOOD: Lipase: 184 U/L — ABNORMAL HIGH (ref 22–51)

## 2014-11-06 LAB — I-STAT CG4 LACTIC ACID, ED: Lactic Acid, Venous: 0.79 mmol/L (ref 0.5–2.0)

## 2014-11-06 SURGERY — IRRIGATION AND DEBRIDEMENT EXTREMITY
Anesthesia: Choice | Laterality: Right

## 2014-11-06 MED ORDER — MIDAZOLAM HCL 2 MG/2ML IJ SOLN
INTRAMUSCULAR | Status: AC
  Filled 2014-11-06: qty 2

## 2014-11-06 MED ORDER — MORPHINE SULFATE 4 MG/ML IJ SOLN
4.0000 mg | Freq: Once | INTRAMUSCULAR | Status: AC
  Administered 2014-11-06: 4 mg via INTRAVENOUS
  Filled 2014-11-06: qty 1

## 2014-11-06 MED ORDER — ONDANSETRON HCL 4 MG/2ML IJ SOLN
4.0000 mg | Freq: Once | INTRAMUSCULAR | Status: AC
  Administered 2014-11-06: 4 mg via INTRAVENOUS
  Filled 2014-11-06: qty 2

## 2014-11-06 MED ORDER — ALPRAZOLAM 0.5 MG PO TABS
0.5000 mg | ORAL_TABLET | Freq: Every day | ORAL | Status: DC
  Administered 2014-11-06 – 2014-11-08 (×3): 0.5 mg via ORAL
  Filled 2014-11-06 (×3): qty 1

## 2014-11-06 MED ORDER — ACETAMINOPHEN 650 MG RE SUPP: 650.0000 mg | Freq: Four times a day (QID) | RECTAL | Status: DC | PRN

## 2014-11-06 MED ORDER — POLYETHYLENE GLYCOL 3350 17 G PO PACK: 17.0000 g | PACK | Freq: Every day | ORAL | Status: DC | PRN

## 2014-11-06 MED ORDER — PREDNISONE 5 MG PO TABS
5.0000 mg | ORAL_TABLET | Freq: Every day | ORAL | Status: DC
  Administered 2014-11-07 – 2014-11-09 (×3): 5 mg via ORAL
  Filled 2014-11-06 (×3): qty 1

## 2014-11-06 MED ORDER — HYDROMORPHONE HCL 1 MG/ML IJ SOLN
1.0000 mg | Freq: Once | INTRAMUSCULAR | Status: AC
  Administered 2014-11-06: 1 mg via INTRAVENOUS
  Filled 2014-11-06: qty 1

## 2014-11-06 MED ORDER — TIGECYCLINE 50 MG IV SOLR: 50.0000 mg | Freq: Two times a day (BID) | INTRAVENOUS | Status: DC

## 2014-11-06 MED ORDER — SODIUM CHLORIDE 0.9 % IV BOLUS (SEPSIS)
1000.0000 mL | Freq: Once | INTRAVENOUS | Status: AC
  Administered 2014-11-06: 1000 mL via INTRAVENOUS

## 2014-11-06 MED ORDER — PANTOPRAZOLE SODIUM 40 MG IV SOLR
40.0000 mg | Freq: Two times a day (BID) | INTRAVENOUS | Status: DC
  Administered 2014-11-06 – 2014-11-09 (×6): 40 mg via INTRAVENOUS
  Filled 2014-11-06 (×7): qty 40

## 2014-11-06 MED ORDER — MORPHINE SULFATE 4 MG/ML IJ SOLN
4.0000 mg | INTRAMUSCULAR | Status: DC | PRN
  Administered 2014-11-06 – 2014-11-07 (×3): 4 mg via INTRAVENOUS
  Filled 2014-11-06 (×3): qty 1

## 2014-11-06 MED ORDER — ENOXAPARIN SODIUM 40 MG/0.4ML ~~LOC~~ SOLN
40.0000 mg | SUBCUTANEOUS | Status: DC
  Administered 2014-11-06 – 2014-11-08 (×3): 40 mg via SUBCUTANEOUS
  Filled 2014-11-06 (×3): qty 0.4

## 2014-11-06 MED ORDER — ACETAMINOPHEN 325 MG PO TABS
650.0000 mg | ORAL_TABLET | Freq: Four times a day (QID) | ORAL | Status: DC | PRN
  Administered 2014-11-07: 650 mg via ORAL
  Filled 2014-11-06: qty 2

## 2014-11-06 MED ORDER — FENTANYL CITRATE (PF) 100 MCG/2ML IJ SOLN
INTRAMUSCULAR | Status: AC
  Filled 2014-11-06: qty 4

## 2014-11-06 MED ORDER — SODIUM CHLORIDE 0.9 % IV SOLN
INTRAVENOUS | Status: AC
  Administered 2014-11-06 – 2014-11-07 (×3): via INTRAVENOUS

## 2014-11-06 MED ORDER — SODIUM CHLORIDE 0.9 % IV SOLN
INTRAVENOUS | Status: AC
  Administered 2014-11-06: 17:00:00 via INTRAVENOUS

## 2014-11-06 MED ORDER — PROMETHAZINE HCL 25 MG/ML IJ SOLN: 12.5000 mg | Freq: Four times a day (QID) | INTRAMUSCULAR | Status: DC | PRN

## 2014-11-06 MED ORDER — AZITHROMYCIN 600 MG PO TABS
600.0000 mg | ORAL_TABLET | Freq: Every evening | ORAL | Status: DC
  Filled 2014-11-06: qty 1

## 2014-11-06 NOTE — H&P (Signed)
Family Medicine Teaching [facility] Admission History and Physical Service Pager: [phone]  Patient name: Lee Klein Medical record number: [id number] Date of birth: 07-14-1950 Age: 64 y.o. Gender: male  Primary Care Provider: [name], MD Consultants: ID Code Status: Full (confirmed on admission)  Chief Complaint: Abdominal Pain, Nausea  Assessment and Plan: Lee Klein is a 64 y.o. male presenting with acute worsening epigastric abdominal pain and nausea (w/o vomiting), found to have acute pancreatitis. 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 Symptoms mostly epigastric abd pain x 1 week with nausea (no vomiting). No known prior episodes pancreatitis. Presumed trigger with complex antibiotic regimen (recently resumed Cefotixin) and chronic Tigecycline, per pharm seemed to be less likely, however ID acknowledges association with pancreatitis. No significant alcohol recently, s/p cholecystectomy 2008. Differential includes possible less likely Crohn's flare (stable on chronic prednisone, currently remission), unlikely bowel obstruction (no evidence on CT, +ostomy output, +BS). Currently vitals stable, pain controlled, without evidence of systemic infection or complications. No acute abd. - Admit to inpatient, med-surg - Vitals per protocol - S/p 1L NS bolus in ED, cont IVF @ 150cc/hr - NPO (allow sips, chips, meds) - Pain control - Morphine IV 4mg  q 3 hr PRN - Phenergan IV 12.5mg  q 6 hr PRN nausea - Repeat BMET for electrolytes in AM, CBC trend WBC - Check Lipid panel in AM  (possible trigger with TG) - Discussed possible antibiotic triggers with ID, currently holding Tigecycline and Cefoxitin. Appreciate recs.  # Crohn's Disease, s/p ileostomy, without evidence of flare Followed by Dr. Karilyn Cota ([facility]) chronically for Crohn's disease, s/p ileostomy. Last follow-up 08/2014, in remission without recent flare. Controlled on chronic prednisone. Ostomy continues to function, some decreased output recently (but reduced PO). CT Abd no evidence of acute flare or bowel obstruction. - Continue home Prednisone 5mg  PO daily - Will FYI notify Dr. 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 [name]) for surgeries / I&D (missed today), and ID outpatient (Dr. Daiva Eves), last seen 08/2014. Complex course since Feb 2016 with identified osteo and abscesses, antibiotics include Zyvox, Amikacin, Cefoxitin, Tigecycline, Azithromycin. Developed sudden ototoxicity on aminoglycoside (since DC amikcain within past 1 month). Per chart review, plans for potential switch to Clofazimine per ID (not yet started). Has PICC-RUE functional (placed within 1-2 weeks ago), current abx regimen: Tigecycline, Azithro, Cefoxitin (recently resumed, and suspected possible trigger for pancreatitis). - Consult Infectious Disease, discussed case with Dr. Luciana Axe, suspicious that Tigecycline and Cefoxitin may be involved with pancreatitis. Recommend holding both currently, re-evaluate in AM. Appreciate recs. - Resume Azithromycin 600mg  PO qhs  # Anxiety - Continue home Xanax 0.5mg  qhs  # GERD - Continue home PPI, now NPO start Protonix IV BID  # Hearing loss, 2/2 antibiotic side-effect - Chronic problem now, likely secondary to complication of aminoglycoside use, see above. - Followed by Audiology outpatient  # Adrenal Insufficiency, on chronic prednisone therapy - Chronic h/o prednisone therapy, previously with attempts to wean off with failures and  subsequent adrenal insufficiency. - Continue chronic home prednisone 5mg  PO daily  # Protein Calorie Malnutrition Per chart review, chronic problem with low albumin, PCP evaluating etiology of  malnutrition for possible lower ext edema - Consider consult to Nutrition once transition to PO  FEN/GI: NPO (may have sips, chips, meds), NS @ 150cc/hr while NPO Prophylaxis: Lovenox 40mg  SQ daily  Disposition: Admit to inpatient for acute pancreatitis, possible due to antibiotic side-effect, NPO, IVF, pain control, monitoring. Also consult ID for antibiotic regimen in complicated Left hand/wrist mycobacterium chronic osteo/abscess.  History of Present Illness:  History provided by patient and wife at bedside. Patient able to respond appropriately to questions written on whiteboard at bedside.  Lee Klein is a 64 y.o. male presenting with worsening epigastric abdominal pain and nausea x 1 week. Patient reports symptoms gradually worsening, seem to have started following resuming Cefoxitin antibiotic about 1 week ago on Friday 6/10, received x 3 days of continuous IV antibiotic and began to have abdominal pain and nausea with this, whereas he was previously well without abdominal pain or other symptoms. Since discontinued Cefoxitin. Symptoms persisted without improvement, described aching, cramping, now more constant abd pain, initially 8/10, currently 6/10, improved with Morphine in ED, had been taking Norco 10/325's at home. Additionally symptoms associated with decreased ostomy output and some decreased PO intake few days. Given chronic h/o Crohn's, he was concerned about possible bowel obstruction with decreased ostomy output. No - Followed by Dr. Karilyn Cota ([facility]) chronically for Crohn's disease, s/p ileostomy - S/p Cholecystectomy 2008, minimal alcohol use (1 beer within past 1 week, < 4 monthly)  - Denies any nausea / vomiting currently, fevers/chills, jaundice, melena, hematochezia  Significant  recent history since Feb 2016 with Left wrist/hand atypical mycobacterium osteo/abscess infection, followed by ID clinic and Ortho (Dr. [name]), s/p about 10 surgeries / I&D. Long course of prolonged abx initially with Zyvox, Cefoxitin, since been on course of Tigecycline, Amikacin, Azithro, developed ototoxicity (suddenly on aminoglycoside) subsequent bilateral deafness recently, since discontinued Amikacin, and returned to Cefoxitin (recently with onset of above symptoms), continued Tigecycline and Azithro. ID had ordered Clofazimine, which seems to have arrived but has not been started yet. - Missed apt today for Dr. [name] surgery I&D L-hand, currently still has wound packing in place - Missed follow-up tomorrow with ID  Lives at home with wife (former [employer] fundraiser), assists with his care, currently has RUE PICC in place for prolonged IV antibiotic courses.  Review Of Systems: Per HPI with the following additions: none Otherwise 12 point review of systems was performed and was unremarkable.  Patient Active Problem List   Diagnosis Date Noted  . Adrenal insufficiency 08/01/2014  . Adult failure to thrive 07/03/2014  . Poor appetite 07/03/2014  . Dysphagia, pharyngoesophageal phase 07/03/2014  . Mycobacterium infection   . Septic arthritis of hand, left   . Mycobacterium infections, atypical   . Septic arthritis   . Osteomyelitis of left hand   . Screening for STD (sexually transmitted disease)   . Wrist joint infection 06/19/2014  . Dehydration 06/11/2014  . Atypical chest pain 06/11/2014  . AKI (acute kidney injury) 06/11/2014  . Hyponatremia 06/11/2014  . Atypical mycobacterial infection of hand 05/30/2014  . Postop check 11/21/2013  . Postoperative wound infection 11/07/2013  . 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 Parastomal hernia 10/03/2013  . SBO (small bowel obstruction) 09/16/2013  . Diarrhea 12/15/2011  . Ileostomy dysfunction 12/15/2011  . Crohn's  disease 06/01/2011   Past Medical History: Past Medical History  Diagnosis Date  . Crohn's disease   . Hernia, abdominal     "repaired 10/2013"  .  GERD (gastroesophageal reflux disease)   . Anxiety   . 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 Kidney stones     "passed them"  . PICC (peripherally inserted central catheter) in place     right arm for home antibiotics  . Osteomyelitis of hand, left, acute   . Hearing loss    Past Surgical History: Past Surgical History  Procedure Laterality Date  . Ileostomy  06/2010; 10/2013  . Colonoscopy    . Upper gastrointestinal endoscopy    . Appendectomy  ~ 2009  . Ileostomy closure N/A 10/23/2013    Procedure: ILEOSTOMY TAKEDOWN, REPAIR OF PARASTOMAL HERNIA.;  Surgeon: Cherylynn Ridges, MD;  Location: [facility];  Service: General;  Laterality: N/A;  . Wrist arthroscopy with debridement Left 05/24/2014    Procedure: LEFT ARTHROSCOPY WRIST CULTURE/BIOPSY DEBRIDEMENT;  Surgeon: Cindee Salt, MD;  Location: [facility];  Service: Orthopedics;  Laterality: Left;  . Wrist fracture surgery Right 06/2006    [name] 09/29/2010  . Esophagogastroduodenoscopy N/A 06/15/2014    Procedure: ESOPHAGOGASTRODUODENOSCOPY (EGD);  Surgeon: Malissa Hippo, MD;  Location: [facility];  Service: Endoscopy;  Laterality: N/A;  . Cholecystectomy  ~ 2008  . Tonsillectomy  1963  . Fracture surgery    . Hernia repair  10/2013    parastomal/notes 10/03/2013  . Colon surgery  ~ 2010    for a colonic stricture at the anastomosis from a colostomy reversal/notes 10/03/2013  . Parastomal hernia repair  10/2013  . Incision and drainage abscess Left 06/22/2014    Procedure: INCISION, DEBRIDEMENT AND IRRIGATION LEFT HAND/WRIST;  Surgeon: Cindee Salt, MD;  Location: [facility];  Service: Orthopedics;  Laterality: Left;  . Incision and drainage of wound Left 07/27/2014    Procedure: IRRIGATION AND DEBRIDEMENT WOUND;  Surgeon: Cindee Salt, MD;   Location: [facility];  Service: Orthopedics;  Laterality: Left;  . Incision and drainage abscess Left 08/09/2014    Procedure: INCISION AND DRAINAGE LEFT WRIST;  Surgeon: Cindee Salt, MD;  Location: [facility];  Service: Orthopedics;  Laterality: Left;  . Incision and drainage abscess Left 09/05/2014    Procedure: INCISION AND DRAINAGE LEFT HAND;  Surgeon: Cindee Salt, MD;  Location: [facility];  Service: Orthopedics;  Laterality: Left;  . I&d extremity Left 10/02/2014    Procedure: IRRIGATION AND DEBRIDEMENT  LEFT HAND;  Surgeon: Cindee Salt, MD;  Location: [facility];  Service: Orthopedics;  Laterality: Left;  . I&d extremity Left 10/12/2014    Procedure: MINOR IRRIGATION AND DEBRIDEMENT LEFT HAND;  Surgeon: Cindee Salt, MD;  Location: [facility];  Service: Orthopedics;  Laterality: Left;   Social History: History  Substance Use Topics  . Smoking status: Former Smoker -- 30 years    Types: Cigars    Quit date: 05/31/2006  . Smokeless tobacco: Never Used  . Alcohol Use: No   Additional social history: Admits 1 beer last week, otherwise < 4 beers monthly. No smoking or other drugs.  Please also refer to relevant sections of EMR.  Family History: Family History  Problem Relation Age of Onset  . Healthy Mother   . Healthy Daughter   . Healthy Son    Allergies and Medications: Allergies  Allergen Reactions  . Ivp Dye [Iodinated Diagnostic Agents]     Joint pain, nausea  . Fish Allergy Nausea And Vomiting  . Rifampin Nausea And Vomiting and Other (See  Comments)    Chills  . Shellfish Allergy Swelling  . Demerol Rash   No current facility-administered medications on file prior to encounter.   Current Outpatient Prescriptions on File Prior to Encounter  Medication Sig Dispense Refill  . acetaminophen (TYLENOL) 325 MG tablet Take 650 mg by mouth every 6 (six) hours as needed (pain).    Marland Kitchen ALPRAZolam (XANAX) 0.5 MG  tablet TAKE 1 TABLET BY MOUTH TWO TIMES DAILY AS NEEDED FOR ANXIETY 60 tablet 2  . amikacin in dextrose 5 % 100 mL Inject 1,800 mg into the vein. Three times per week. Monday , Wednesday, Friday.    . Ascorbic Acid (VITAMIN C) 1000 MG tablet Take 1,000 mg by mouth daily.    Marland Kitchen azithromycin (ZITHROMAX) 600 MG tablet Take 1 tablet (600 mg total) by mouth daily. (Patient taking differently: Take 600 mg by mouth every evening. ) 30 tablet 11  . Calcium Carbonate-Vitamin D (CALCIUM 600 + D PO) Take 1 tablet by mouth daily.     Marland Kitchen HYDROcodone-acetaminophen (NORCO) 10-325 MG per tablet Take 1 tablet by mouth every 6 (six) hours as needed. 30 tablet 0  . HYDROcodone-acetaminophen (NORCO) 10-325 MG per tablet Take 1 tablet by mouth every 6 (six) hours as needed. 30 tablet 0  . HYDROcodone-acetaminophen (NORCO) 10-325 MG per tablet Take 1 tablet by mouth every 6 (six) hours as needed. 30 tablet 0  . loperamide (IMODIUM A-D) 2 MG tablet Take 8 mg by mouth 2 (two) times daily.     . Multiple Vitamins-Minerals (MULTIVITAMIN PO) Take 1 tablet by mouth daily.     Marland Kitchen nystatin (MYCOSTATIN/NYSTOP) 100000 UNIT/GM POWD Apply topically daily as needed (FOR IRRITATION). PRN  1  . pantoprazole (PROTONIX) 40 MG tablet Take 1 tablet (40 mg total) by mouth 2 (two) times daily before a meal. 180 tablet 4  . predniSONE (DELTASONE) 5 MG tablet Take 5 mg by mouth daily with breakfast.    . promethazine (PHENERGAN) 25 MG tablet Take 25 mg by mouth as needed.   0  . ranitidine (ZANTAC) 150 MG capsule Take 150 mg by mouth 2 (two) times daily.    . Tigecycline (TYGACIL IV) Inject 50 mg into the vein 2 (two) times daily.    . Zinc 50 MG CAPS Take 1 capsule by mouth daily.       Objective: BP 178/89 mmHg  Pulse 50  Temp(Src) 97.8 F (36.6 C) (Oral)  Resp 20  SpO2 100% Exam: Gen - well-appearing, sitting up in bed, comfortable and cooperative, NAD. Deafness, with appropriate verbal communications (answers written questions  appropriately on whiteboard) HEENT - NCAT, PERRL, EOMI, patent nares w/o congestion, oropharynx clear, MMM Neck - supple, non-tender, no LAD, no thyromegaly Heart - RRR, no murmurs heard Lungs - CTAB, no wheezing, crackles, or rhonchi. Normal work of breathing. Abd - soft, non-distended, upper abdomen tenderness mostly localized to epigastric, some RUQ/LUQ, negative Murphy's, notable abdominal hernia around ostomy site, ostomy bag in place with normal appearing yellow-brown semi solid output, +active BS Ext - Left Hand/Wrist - Several small packed wounds 1-2cm slight drainage (prior I&D abscesses), cool with minimal edema, no surrounding erythema, tenderness, warmth. No bleeding. Wrapped with ACE. Other ext, non-tender, trace b/l LE pitting edema, peripheral pulses intact +2 b/l. RUE PICC line (no sign of infection) Skin - warm, dry, no rashes. Various superficial ecchymosis. Neuro - awake, alert, oriented, grossly non-focal, intact muscle strength 5/5 b/l (except 3/5 grip L-hand), intact distal sensation to light  touch   Labs and Imaging: CBC BMET   Recent Labs Lab 11/06/14 1123  WBC 13.7*  HGB 11.7*  HCT 35.3*  PLT 165    Recent Labs Lab 11/06/14 1123  NA 141  K 4.0  CL 113*  CO2 23  BUN 18  CREATININE 0.86  GLUCOSE 75  CALCIUM 8.3*     Lipase 184  Lactic Acid 0.79  UA - clear, neg hgb, mod leuks, neg nitrite, neg protein, spec grav 1.020, rbc 0-2, WBC 7-10  6/21 CT Abd / Pelvis (w/o contrast = allergy) IMPRESSION: Diffuse pancreatic edema compatible with pancreatitis. No abscess or ascites  Peristomal hernia on the right has improved since the prior study. No bowel obstruction.  Smitty Cords, DO 11/06/2014, 2:57 PM PGY-2, [facility] FPTS Intern pager: [phone], text pages welcome

## 2014-11-06 NOTE — Telephone Encounter (Signed)
Patient is currently in the hospital with pancreatitis and had to cancel appointment for tomorrow.

## 2014-11-06 NOTE — Consult Note (Signed)
Patient known to ID service.  Came in with pancreatitis, likely from medications.  Tigecycline associated with pancreatitis, cefoxitin less associated.  Will hold both for now.  Will follow up in am.

## 2014-11-06 NOTE — Telephone Encounter (Signed)
Thanks Tamika. 

## 2014-11-06 NOTE — ED Notes (Addendum)
Family reports pt having infection to left forearm, was suppose to have surgery today but unable to due to severe abd pain, possible SBO. Denies n/v. Has PICC line in place.

## 2014-11-06 NOTE — ED Notes (Signed)
Pt back from CT

## 2014-11-06 NOTE — ED Notes (Signed)
Pt leaving for CT at current time.

## 2014-11-06 NOTE — ED Provider Notes (Signed)
CSN: [id number]     Arrival date & time 11/06/14  1023 History   First MD Initiated Contact with Patient 11/06/14 1059     Chief Complaint  Patient presents with  . Abdominal Pain     (Consider location/radiation/quality/duration/timing/severity/associated sxs/prior Treatment) HPI Comments: Patient is a 64 year old male past medical history significant for Crohn's disease, abdominal hernia, osteomyelitis of left hand presenting to the emergency department for severe onset abdominal pain this morning. Patient states he's had mild to moderate generalized abdominal pain beginning Sunday evening that is progressively worsened. He states he had some nausea and vomiting Saturday but his wife today as well after eating out Friday. Last stool output in his ileostomy was last evening. He has tried MiraLAX and enema with no improvement. Patient has extensive abdominal surgical history including 2 ileostomy placement and takedowns, appendectomy, cholecystectomy, parastomal hernia repair.   Patient is a 64 y.o. male presenting with abdominal pain.  Abdominal Pain Associated symptoms: constipation     Past Medical History  Diagnosis Date  . Crohn's disease   . Hernia, abdominal     "repaired 10/2013"  . GERD (gastroesophageal reflux disease)   . Anxiety   . 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 Kidney stones     "passed them"  . PICC (peripherally inserted central catheter) in place     right arm for home antibiotics  . Osteomyelitis of hand, left, acute   . Hearing loss    Past Surgical History  Procedure Laterality Date  . Ileostomy  06/2010; 10/2013  . Colonoscopy    . Upper gastrointestinal endoscopy    . Appendectomy  ~ 2009  . Ileostomy closure N/A 10/23/2013    Procedure: ILEOSTOMY TAKEDOWN, REPAIR OF PARASTOMAL HERNIA.;  Surgeon: Cherylynn Ridges, MD;  Location: [facility];  Service: General;  Laterality: N/A;  . Wrist arthroscopy with debridement Left 05/24/2014     Procedure: LEFT ARTHROSCOPY WRIST CULTURE/BIOPSY DEBRIDEMENT;  Surgeon: Cindee Salt, MD;  Location: [name];  Service: Orthopedics;  Laterality: Left;  . Wrist fracture surgery Right 06/2006    [name] 09/29/2010  . Esophagogastroduodenoscopy N/A 06/15/2014    Procedure: ESOPHAGOGASTRODUODENOSCOPY (EGD);  Surgeon: Malissa Hippo, MD;  Location: [facility];  Service: Endoscopy;  Laterality: N/A;  . Cholecystectomy  ~ 2008  . Tonsillectomy  1963  . Fracture surgery    . Hernia repair  10/2013    parastomal/notes 10/03/2013  . Colon surgery  ~ 2010    for a colonic stricture at the anastomosis from a colostomy reversal/notes 10/03/2013  . Parastomal hernia repair  10/2013  . Incision and drainage abscess Left 06/22/2014    Procedure: INCISION, DEBRIDEMENT AND IRRIGATION LEFT HAND/WRIST;  Surgeon: Cindee Salt, MD;  Location: [facility];  Service: Orthopedics;  Laterality: Left;  . Incision and drainage of wound Left 07/27/2014    Procedure: IRRIGATION AND DEBRIDEMENT WOUND;  Surgeon: Cindee Salt, MD;  Location: [facility];  Service: Orthopedics;  Laterality: Left;  . Incision and drainage abscess Left 08/09/2014    Procedure: INCISION AND DRAINAGE LEFT WRIST;  Surgeon: Cindee Salt, MD;  Location: [facility];  Service: Orthopedics;  Laterality: Left;  . Incision and drainage abscess Left 09/05/2014    Procedure: INCISION AND DRAINAGE LEFT HAND;  Surgeon: Cindee Salt, MD;  Location: [facility];  Service: Orthopedics;  Laterality: Left;  . I&d extremity Left 10/02/2014  Procedure: IRRIGATION AND DEBRIDEMENT  LEFT HAND;  Surgeon: Cindee Salt, MD;  Location: [facility];  Service: Orthopedics;  Laterality: Left;  . I&d extremity Left 10/12/2014    Procedure: MINOR IRRIGATION AND DEBRIDEMENT LEFT HAND;  Surgeon: Cindee Salt, MD;  Location: [facility];  Service: Orthopedics;  Laterality: Left;   Family History   Problem Relation Age of Onset  . Healthy Mother   . Healthy Daughter   . Healthy Son    History  Substance Use Topics  . Smoking status: Former Smoker -- 30 years    Types: Cigars    Quit date: 05/31/2006  . Smokeless tobacco: Never Used  . Alcohol Use: No    Review of Systems  Gastrointestinal: Positive for abdominal pain and constipation.  All other systems reviewed and are negative.     Allergies  Ivp dye; Fish allergy; Rifampin; Shellfish allergy; and Demerol  Home Medications   Prior to Admission medications   Medication Sig Start Date End Date Taking? Authorizing Provider  acetaminophen (TYLENOL) 325 MG tablet Take 650 mg by mouth every 6 (six) hours as needed (pain).    Historical Provider, MD  ALPRAZolam (XANAX) 0.5 MG tablet TAKE 1 TABLET BY MOUTH TWO TIMES DAILY AS NEEDED FOR ANXIETY 08/01/14   Malissa Hippo, MD  amikacin in dextrose 5 % 100 mL Inject 1,800 mg into the vein. Three times per week. Monday , Wednesday, Friday.    Historical Provider, MD  Ascorbic Acid (VITAMIN C) 1000 MG tablet Take 1,000 mg by mouth daily.    Historical Provider, MD  azithromycin (ZITHROMAX) 600 MG tablet Take 1 tablet (600 mg total) by mouth daily. Patient taking differently: Take 600 mg by mouth every evening.  07/03/14   Randall Hiss, MD  Calcium Carbonate-Vitamin D (CALCIUM 600 + D PO) Take 1 tablet by mouth daily.     Historical Provider, MD  citalopram (CELEXA) 20 MG tablet Take 20 mg by mouth daily. 10/23/14   Historical Provider, MD  clonazePAM (KLONOPIN) 0.5 MG tablet Take 0.5 mg by mouth every 8 (eight) hours as needed. 09/27/14   Historical Provider, MD  HYDROcodone-acetaminophen (NORCO) 10-325 MG per tablet Take 1 tablet by mouth every 6 (six) hours as needed. 07/27/14   Cindee Salt, MD  HYDROcodone-acetaminophen (NORCO) 10-325 MG per tablet Take 1 tablet by mouth every 6 (six) hours as needed. 10/02/14   Cindee Salt, MD  HYDROcodone-acetaminophen (NORCO) 10-325 MG per  tablet Take 1 tablet by mouth every 6 (six) hours as needed. 10/12/14   Cindee Salt, MD  loperamide (IMODIUM A-D) 2 MG tablet Take 8 mg by mouth 2 (two) times daily.     Historical Provider, MD  Multiple Vitamins-Minerals (MULTIVITAMIN PO) Take 1 tablet by mouth daily.     Historical Provider, MD  nystatin (MYCOSTATIN/NYSTOP) 100000 UNIT/GM POWD Apply topically daily as needed (FOR IRRITATION). PRN 03/13/14   Historical Provider, MD  pantoprazole (PROTONIX) 40 MG tablet Take 1 tablet (40 mg total) by mouth 2 (two) times daily before a meal. 08/02/14   Len Blalock, NP  predniSONE (DELTASONE) 5 MG tablet Take 5 mg by mouth daily with breakfast.    Historical Provider, MD  promethazine (PHENERGAN) 25 MG tablet Take 25 mg by mouth as needed.  07/27/14   Historical Provider, MD  ranitidine (ZANTAC) 150 MG capsule Take 150 mg by mouth 2 (two) times daily.    Historical Provider, MD  Tigecycline (TYGACIL IV) Inject  50 mg into the vein 2 (two) times daily.    Historical Provider, MD  Zinc 50 MG CAPS Take 1 capsule by mouth daily.     Historical Provider, MD   BP 175/86 mmHg  Pulse 57  Temp(Src) 97.8 F (36.6 C) (Oral)  Resp 20  SpO2 99% Physical Exam  Constitutional: He is oriented to person, place, and time. He appears well-developed and well-nourished.  HENT:  Head: Normocephalic and atraumatic.  Eyes: Conjunctivae are normal.  Neck: Neck supple.  Cardiovascular: Normal rate, regular rhythm and normal heart sounds.   Pulmonary/Chest: Effort normal and breath sounds normal.  Abdominal: Soft. Bowel sounds are decreased. There is generalized tenderness.  Ileostomy bag present with minimal stool in bag. No blood or melanotic.  Musculoskeletal:  Left hand and wrist in dressing.  Neurological: He is alert and oriented to person, place, and time.  Skin: Skin is warm.  Nursing note and vitals reviewed.   ED Course  Procedures (including critical care time) Medications  0.9 %  sodium chloride  infusion (not administered)  HYDROmorphone (DILAUDID) injection 1 mg (1 mg Intravenous Given 11/06/14 1141)  ondansetron (ZOFRAN) injection 4 mg (4 mg Intravenous Given 11/06/14 1141)  sodium chloride 0.9 % bolus 1,000 mL (0 mLs Intravenous Stopped 11/06/14 1525)  morphine 4 MG/ML injection 4 mg (4 mg Intravenous Given 11/06/14 1230)  morphine 4 MG/ML injection 4 mg (4 mg Intravenous Given 11/06/14 1421)    Labs Review Labs Reviewed  CBC WITH DIFFERENTIAL/PLATELET - Abnormal; Notable for the following:    WBC 13.7 (*)    RBC 3.57 (*)    Hemoglobin 11.7 (*)    HCT 35.3 (*)    RDW 16.2 (*)    Neutrophils Relative % 78 (*)    Neutro Abs 10.7 (*)    Monocytes Absolute 1.2 (*)    All other components within normal limits  COMPREHENSIVE METABOLIC PANEL - Abnormal; Notable for the following:    Chloride 113 (*)    Calcium 8.3 (*)    Total Protein 4.9 (*)    Albumin 2.4 (*)    Alkaline Phosphatase 147 (*)    All other components within normal limits  LIPASE, BLOOD - Abnormal; Notable for the following:    Lipase 184 (*)    All other components within normal limits  URINALYSIS, ROUTINE W REFLEX MICROSCOPIC (NOT AT [facility]) - Abnormal; Notable for the following:    Leukocytes, UA MODERATE (*)    All other components within normal limits  URINE MICROSCOPIC-ADD ON - Abnormal; Notable for the following:    Squamous Epithelial / LPF FEW (*)    Bacteria, UA FEW (*)    Casts HYALINE CASTS (*)    All other components within normal limits  I-STAT CG4 LACTIC ACID, ED    Imaging Review Ct Abdomen Pelvis Wo Contrast  11/06/2014   CLINICAL DATA:  Abdominal pain  EXAM: CT ABDOMEN AND PELVIS WITHOUT CONTRAST  TECHNIQUE: Multidetector CT imaging of the abdomen and pelvis was performed following the standard protocol without IV contrast.  COMPARISON:  CT abdomen pelvis 09/16/2013  FINDINGS: Prominent extrapleural fat posteriorly in the lung bases. Bochdalek's hernia on the right unchanged.  Prior  cholecystectomy. Liver and bile ducts are normal. Normal appearing spleen.  There is edema in the pancreas which has developed since the prior study likely due to acute pancreatitis. No pancreatic calcification or mass.  Kidneys show no mass or obstruction. No renal calculi. Urinary bladder normal. Mild prostate  enlargement.  Surgical staple line in the rectum. Right lower quadrant colostomy. Peristomal hernia containing bowel loops has improved in size since the prior study. Negative for bowel obstruction.  No free fluid. Negative for adenopathy or mass lesion. Advanced disc degeneration at L2-3 and L5-S1.  IMPRESSION: Diffuse pancreatic edema compatible with pancreatitis. No abscess or ascites  Peristomal hernia on the right has improved since the prior study. No bowel obstruction.   Electronically Signed   By: Marlan Palau M.D.   On: 11/06/2014 13:59     EKG Interpretation None      MDM   Final diagnoses:  Abdominal pain  Acute pancreatitis, unspecified pancreatitis type  Osteomyelitis of left hand    Filed Vitals:   11/06/14 1430  BP: 175/86  Pulse: 57  Temp:   Resp:    I have reviewed nursing notes, vital signs, and all appropriate lab and imaging results if ordered as above.   Patient presenting with acute onset abdominal pain that has been worsening since Sunday, history of severe abdominal pain earlier in the week after discontinuing one of his IV antibiosis for left osteomyelitis infection. Abdomen is generalized tenderness without peritoneal signs. Bowel sounds present. No evidence of infection around the colostomy site. Labs reviewed. CT scan reviewed with evidence of bowel obstruction, pancreatitis noted, consistent with increased lipase. We'll continue to treat with IV fluids and pain medicine and admit patient to family practice for further management and evaluation. Patient d/w with Dr. Gwendolyn Grant, agrees with plan.      Francee Piccolo, PA-C 11/06/14 1541  Elwin Mocha, MD 11/06/14 1550

## 2014-11-06 NOTE — Progress Notes (Signed)
[facility]  Patient Status: Active pt with [facility]  [facility] is providing the following services: HHRN and [facility] team for home IV ABX.  [facility] hospital team will follow pt and support  DC when ordered.  If patient discharges after hours, please call [phone].   Sedalia Muta 11/06/2014, 4:23 PM

## 2014-11-06 NOTE — Telephone Encounter (Signed)
Ok no problem.  

## 2014-11-06 NOTE — Telephone Encounter (Signed)
[name] maybe we can bring him his clofazimine while he is in the hospital so that he can have it with him to start when he ends up going home?  Tamika lets not put any more folks in his place for tomorrow as the clinic is already overbooked + research guy who didn't come today will undoubtedly come tomorrow

## 2014-11-07 ENCOUNTER — Telehealth (INDEPENDENT_AMBULATORY_CARE_PROVIDER_SITE_OTHER): Payer: Self-pay | Admitting: *Deleted

## 2014-11-07 ENCOUNTER — Inpatient Hospital Stay (HOSPITAL_COMMUNITY): Payer: Managed Care, Other (non HMO)

## 2014-11-07 ENCOUNTER — Ambulatory Visit: Payer: Managed Care, Other (non HMO) | Admitting: Infectious Disease

## 2014-11-07 DIAGNOSIS — K853 Drug induced acute pancreatitis: Principal | ICD-10-CM

## 2014-11-07 DIAGNOSIS — M869 Osteomyelitis, unspecified: Secondary | ICD-10-CM

## 2014-11-07 DIAGNOSIS — R109 Unspecified abdominal pain: Secondary | ICD-10-CM | POA: Insufficient documentation

## 2014-11-07 DIAGNOSIS — Z452 Encounter for adjustment and management of vascular access device: Secondary | ICD-10-CM

## 2014-11-07 DIAGNOSIS — A318 Other mycobacterial infections: Secondary | ICD-10-CM

## 2014-11-07 LAB — BASIC METABOLIC PANEL
ANION GAP: 6 (ref 5–15)
BUN: 11 mg/dL (ref 6–20)
CO2: 23 mmol/L (ref 22–32)
Calcium: 7.8 mg/dL — ABNORMAL LOW (ref 8.9–10.3)
Chloride: 112 mmol/L — ABNORMAL HIGH (ref 101–111)
Creatinine, Ser: 0.77 mg/dL (ref 0.61–1.24)
GFR calc Af Amer: >60 mL/min (ref >60)
GFR calc non Af Amer: >60 mL/min (ref >60)
GLUCOSE: 51 mg/dL — AB (ref 65–99)
POTASSIUM: 4 mmol/L (ref 3.5–5.1)
Sodium: 141 mmol/L (ref 135–145)

## 2014-11-07 LAB — CBC
HEMATOCRIT: 33.8 % — AB (ref 39.0–52.0)
HEMOGLOBIN: 10.8 g/dL — AB (ref 13.0–17.0)
MCH: 31.5 pg (ref 26.0–34.0)
MCHC: 32 g/dL (ref 30.0–36.0)
MCV: 98.5 fL (ref 78.0–100.0)
Platelets: 128 10*3/uL — ABNORMAL LOW (ref 150–400)
RBC: 3.43 MIL/uL — AB (ref 4.22–5.81)
RDW: 16.3 % — ABNORMAL HIGH (ref 11.5–15.5)
WBC: 7 10*3/uL (ref 4.0–10.5)

## 2014-11-07 LAB — LIPID PANEL
Cholesterol: 88 mg/dL (ref 0–200)
HDL: 47 mg/dL (ref >40)
LDL CALC: 25 mg/dL (ref 0–99)
Total CHOL/HDL Ratio: 1.9 RATIO
Triglycerides: 81 mg/dL (ref <150)
VLDL: 16 mg/dL (ref 0–40)

## 2014-11-07 MED ORDER — SODIUM CHLORIDE 0.9 % IV SOLN
INTRAVENOUS | Status: DC
  Administered 2014-11-07: 23:00:00 via INTRAVENOUS

## 2014-11-07 MED ORDER — MORPHINE SULFATE 4 MG/ML IJ SOLN
6.0000 mg | Freq: Once | INTRAMUSCULAR | Status: AC | PRN
  Administered 2014-11-07: 6 mg via INTRAVENOUS
  Filled 2014-11-07: qty 2

## 2014-11-07 MED ORDER — MORPHINE SULFATE 4 MG/ML IJ SOLN
6.0000 mg | INTRAMUSCULAR | Status: DC | PRN
  Administered 2014-11-07: 6 mg via INTRAVENOUS
  Filled 2014-11-07: qty 2

## 2014-11-07 MED ORDER — MORPHINE SULFATE 4 MG/ML IJ SOLN: 6.0000 mg | INTRAMUSCULAR | Status: DC | PRN

## 2014-11-07 MED ORDER — HYDROMORPHONE HCL 1 MG/ML IJ SOLN
0.5000 mg | INTRAMUSCULAR | Status: DC | PRN
  Administered 2014-11-07 (×2): 0.5 mg via INTRAVENOUS
  Filled 2014-11-07 (×2): qty 1

## 2014-11-07 MED ORDER — SODIUM CHLORIDE 0.9 % IJ SOLN
10.0000 mL | INTRAMUSCULAR | Status: DC | PRN
  Administered 2014-11-07 – 2014-11-09 (×4): 10 mL
  Filled 2014-11-07 (×4): qty 40

## 2014-11-07 MED ORDER — CETYLPYRIDINIUM CHLORIDE 0.05 % MT LIQD
7.0000 mL | Freq: Two times a day (BID) | OROMUCOSAL | Status: DC
Start: 2014-11-07 — End: 2014-11-09
  Administered 2014-11-07 – 2014-11-09 (×4): 7 mL via OROMUCOSAL

## 2014-11-07 NOTE — Consult Note (Addendum)
[facility]     Reason for Consult: Mycobacteria abscessus infection   Referring Physician: Dr. Jennette Kettle  Active Problems:   Acute pancreatitis   . ALPRAZolam  0.5 mg Oral QHS  . antiseptic oral rinse  7 mL Mouth Rinse BID  . enoxaparin (LOVENOX) injection  40 mg Subcutaneous Q24H  . pantoprazole (PROTONIX) IV  40 mg Intravenous Q12H  . predniSONE  5 mg Oral Q breakfast    Recommendations: Will hold his medications for now, let his pancreatitis improve, start to eat  Will try to restart in am if he is feeling better with tigecycline, azithromycin and we have provided him with clofazamine with appropriate paperwork which will be ordered as home medication to start next week at home  Assessment: M abscessus infection   Antibiotics: None for now  HPI: Lee Klein is a 64 y.o. 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 [name] in January 2016.  He has been on multiple regimens with cefoxitin, amikacin, clarithromycin, zyvox.  He unfortunately lost his hearing likely from amikacin, did not tolerate zyvox and recently stomach pain and pancreatitis temporally related to cefoxitin use.  His infection has greatly improved but has been difficult to find a good regimen.  The cefoxitin is intermediate.  Tigecycline as well is on national shortage so may run out soon. He comes in yesterday with worsening pain and elevated lipase and CT with pancreatitis.    Wife at bedside and provided history  Wife and patient counseled by ID pharmacist with side effects of clofazamine and signed consent   Review of Systems: A comprehensive review of systems was negative.  Past Medical History  Diagnosis Date  . Crohn's disease   . Hernia, abdominal     "repaired 10/2013"  . GERD (gastroesophageal reflux disease)   . Anxiety   . 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 Kidney stones     "passed them"  . PICC (peripherally inserted central catheter) in place     right arm for home antibiotics  . Osteomyelitis of hand, left, acute   . Hearing loss   . Pancreatitis 10/2014    History  Substance Use Topics  . Smoking status: Former Smoker -- 30 years    Types: Cigars    Quit date: 05/31/2006  . Smokeless tobacco: Never Used  . Alcohol Use: 0.0 oz/week    0 Standard drinks or equivalent per week     Comment: occasional " maybe 2 beers a month "    Family History  Problem Relation Age of Onset  . Healthy Mother   . Healthy Daughter   . Healthy Son    Allergies  Allergen Reactions  . Iodine Nausea Only    Other reaction(s): Other Joint pain  . Ivp Dye [Iodinated Diagnostic Agents]     Joint pain, nausea  . Fish Allergy Nausea And Vomiting  . Rifampin Nausea And Vomiting and Other (See Comments)    Chills  . Shellfish Allergy Swelling  . Demerol Rash    OBJECTIVE: Blood pressure 158/60, pulse 66, temperature 97.8 F (36.6 C), temperature source Oral, resp. rate 17, height 5\' 10"  (1.778 m), weight 174 lb 2.6 oz (79 kg), SpO2 95 %. General: awake, alert, nad Skin: no rasehs Lungs: CTA B Cor: RRR Abdomen: soft, nt,   Microbiology: No results found for this  or any previous visit (from the past 240 hour(s)).  Staci Righter, MD [facility] [email or web address] [phone] pager  [phone] cell 11/07/2014, 8:53 AM

## 2014-11-07 NOTE — Progress Notes (Signed)
Initial Nutrition Assessment  DOCUMENTATION CODES:  Not applicable  INTERVENTION:   (Rd will follow for diet advancement, supplement diet as appropriate)  NUTRITION DIAGNOSIS:  Inadequate oral intake related to inability to eat as evidenced by NPO status.  GOAL:  Patient will meet greater than or equal to 90% of their needs  MONITOR:  PO intake, Supplement acceptance, Diet advancement, Labs, Weight trends, Skin, I & O's  REASON FOR ASSESSMENT:  Malnutrition Screening Tool    ASSESSMENT: Lee Klein is a 64 y.o. male presenting with acute worsening epigastric abdominal pain and nausea (w/o vomiting), found to have acute pancreatitis. 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 Pt also with lt hand osteomyelitis.   Attempted to assess pt x 2, however, pt unavailable at times of visits; in with nursing staff for ADLs.   Pt is currently NPO. Noted UBW is between 172-174#, per [facility] RD note in 06/2014. It appears pt has been maintain wt between 155-160# over the past 3 months.   ID following. Plan is for I&D of left hand on Friday, 11/09/14.  Height:  Ht Readings from Last 1 Encounters:  11/06/14 5\' 10"  (1.778 m)    Weight:  Wt Readings from Last 1 Encounters:  11/06/14 174 lb 2.6 oz (79 kg)    Ideal Body Weight:  75.5 kg  Wt Readings from Last 10 Encounters:  11/06/14 174 lb 2.6 oz (79 kg)  10/12/14 156 lb (70.761 kg)  10/02/14 161 lb (73.029 kg)  09/11/14 159 lb 1.6 oz (72.167 kg)  09/05/14 157 lb (71.215 kg)  09/03/14 161 lb 8 oz (73.256 kg)  09/03/14 155 lb (70.308 kg)  08/09/14 155 lb (70.308 kg)  08/01/14 163 lb (73.936 kg)  07/27/14 161 lb 2 oz (73.086 kg)    BMI:  Body mass index is  24.99 kg/(m^2).  Estimated Nutritional Needs:  Kcal:  2100-2300  Protein:  100-110 grams  Fluid:  2.1-2.3 L  Skin:  Wound (see comment) (closed lt hand incision)  Diet Order:  Diet NPO time specified Except for: Sips with Meds  EDUCATION NEEDS:  No education needs identified at this time   Intake/Output Summary (Last 24 hours) at 11/07/14 1621 Last data filed at 11/07/14 1500  Gross per 24 hour  Intake   2700 ml  Output      0 ml  Net   2700 ml    Last BM:  11/06/14  Jannah Guardiola A. Mayford Knife, RD, LDN, CDE Pager: [phone] After hours Pager: [phone]

## 2014-11-07 NOTE — Progress Notes (Signed)
Family Medicine Teaching Service Daily Progress Note Intern Pager: [phone]  Patient name: Lee Klein Medical record number: [id number] Date of birth: 10/27/1950 Age: 64 y.o. Gender: male  Primary Care Provider: [name], MD Consultants: ID Code Status: full code  Pt Overview and Major Events to Date:  6/21 - admit for pancreatitis  Assessment and Plan:  AMONTE BROOKOVER is a 64 y.o. male presenting with acute worsening epigastric abdominal pain and nausea (w/o vomiting), found to have acute pancreatitis. 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  No known prior episodes pancreatitis. Presumed trigger with complex antibiotic regimen (recently resumed Cefotixin) and chronic Tigecycline, per pharm seemed to be less likely, however ID acknowledges association with pancreatitis. No significant alcohol recently, s/p cholecystectomy 2008. Differential includes possible less likely Crohn's flare (stable on chronic prednisone, currently remission), unlikely bowel obstruction (no evidence on CT, +ostomy output, +BS). Currently vitals stable, pain controlled, without evidence of systemic infection or complications. No acute abd. - S/p 1L NS bolus in ED, cont IVF @ 150cc/hr - NPO (ships and chips) - consider advancing this PM if pain control improving - Pain control - Morphine IV  q 3 hr PRN (req'd  in last 24h) - consider switching to Dilaudid for im[proved pain control - Phenergan IV 12.5mg  q 6 hr PRN nausea - Lipid panel shows triglycerides 81 - Discussed possible antibiotic triggers with ID,  currently holding Tigecycline and Cefoxitin. Appreciate recs.  # Crohn's Disease, s/p ileostomy, without evidence of flare: Followed by Dr. Karilyn Cota ([facility]) chronically for Crohn's disease, s/p ileostomy. Last follow-up 08/2014, in remission without recent flare. Controlled on chronic prednisone. Ostomy continues to function, some decreased output recently (but reduced PO). CT Abd no evidence of acute flare or bowel obstruction. - Continue home Prednisone  PO daily - Will FYI notify Dr. 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 [name]) for surgeries / I&D (missed today), and ID outpatient (Dr. Daiva Eves), last seen 08/2014. Complex course since Feb 2016 with identified osteo and abscesses, antibiotics include Zyvox, Amikacin, Cefoxitin, Tigecycline, Azithromycin. Developed sudden ototoxicity on aminoglycoside (since DC amikcain within past 1 month). Per chart review, plans for potential switch to Clofazimine per ID (not yet started). Has PICC-RUE functional (placed within 1-2 weeks ago), current abx regimen: Tigecycline, Azithro, Cefoxitin (recently resumed, and suspected possible trigger for pancreatitis). - Consult Infectious Disease, appreciate recs  - Restart tigecycline and Azithromycin in the AM when pain is improving - will start clofazamine next week after discharge - Dr. Verl Bangs consulting, appreciate recs - planning for I&D at day surgery Friday afternoon  # Anxiety - Continue home Xanax 0.5mg  qhs  # GERD - Continue home PPI, now NPO start Protonix IV BID  # Hearing loss, 2/2 antibiotic side-effect Chronic problem now, likely secondary to complication of aminoglycoside use, see above. - Followed by Audiology outpatient  # Adrenal Insufficiency, on chronic prednisone therapy: Chronic h/o prednisone therapy, previously with attempts to wean off with failures and subsequent adrenal insufficiency. - Continue chronic home prednisone   PO daily  # Protein Calorie Malnutrition: Per chart review, chronic problem with low albumin, PCP evaluating etiology of malnutrition for  possible lower ext edema - Consider consult to Nutrition once transition to PO  FEN/GI: NPO (sips and chips), NS @ 150cc/hr while NPO Prophylaxis: Lovenox  SQ daily  Disposition: Floor status, d/c pending improvement in pain control, diet advancement and abx regimen  Subjective:  Reports that pain is not any better and morphine does not seem to help pain.  He feels like his stomach hurts because of being hungry (wife wonders whether this is actually nausea).    Objective: Temp:  [97.6 F (36.4 C)-97.8 F (36.6 C)] 97.8 F (36.6 C) (06/22 0604) Pulse Rate:  [50-90] 66 (06/22 0604) Resp:  [16-20] 17 (06/22 0604) BP: (145-190)/(60-108) 158/60 mmHg (06/22 0604) SpO2:  [95 %-100 %] 95 % (06/22 0604) Weight:  [174 lb 2.6 oz (79 kg)] 174 lb 2.6 oz (79 kg) (06/21 1543) Physical Exam: Gen - well-appearing, sitting up in bed, comfortable and cooperative, NAD. Deafness, with appropriate verbal communications (answers written questions appropriately on whiteboard) HEENT - NCAT, PERRL, EOMI, MMM Heart - RRR, no murmurs heard Lungs - CTAB, no wheezing, crackles, or rhonchi. Normal work of breathing. Abd - soft, non-distended, upper abdomen tenderness mostly localized to epigastric, some LUQ, negative Murphy's, notable abdominal hernia around ostomy site, ostomy bag in place with normal appearing yellow-brown semi solid output, +active BS Ext - Left Hand/Wrist - Wrapped with ACE. Other ext, non-tender, trace b/l LE pitting edema (L>R), peripheral pulses intact +2 b/l. RUE PICC line (no sign of infection) Skin - warm, dry, no rashes. Various superficial ecchymosis. Neuro - awake, alert, oriented, grossly non-focal, intact muscle strength 5/5 b/l (except 3/5 grip L-hand), intact distal sensation to light touch  Laboratory:  Recent Labs Lab 11/06/14 1123  11/07/14 0534  WBC 13.7* 7.0  HGB 11.7* 10.8*  HCT 35.3* 33.8*  PLT 165 128*    Recent Labs Lab 11/06/14 1123 11/07/14 0534  NA 141 141  K 4.0 4.0  CL 113* 112*  CO2 23 23  BUN 18 11  CREATININE 0.86 0.77  CALCIUM 8.3* 7.8*  PROT 4.9*  --   BILITOT 0.7  --   ALKPHOS 147*  --   ALT 53  --   AST 39  --   GLUCOSE 75 51*    Urinalysis    Component Value Date/Time   COLORURINE YELLOW 11/06/2014 1357   APPEARANCEUR CLEAR 11/06/2014 1357   LABSPEC 1.020 11/06/2014 1357   PHURINE 5.0 11/06/2014 1357   GLUCOSEU NEGATIVE 11/06/2014 1357   HGBUR NEGATIVE 11/06/2014 1357   BILIRUBINUR NEGATIVE 11/06/2014 1357   KETONESUR NEGATIVE 11/06/2014 1357   PROTEINUR NEGATIVE 11/06/2014 1357   UROBILINOGEN 0.2 11/06/2014 1357   NITRITE NEGATIVE 11/06/2014 1357   LEUKOCYTESUR MODERATE* 11/06/2014 1357    Lipid Panel     Component Value Date/Time   CHOL 88 11/07/2014 0534   TRIG 81 11/07/2014 0534   HDL 47 11/07/2014 0534   CHOLHDL 1.9 11/07/2014 0534   VLDL 16 11/07/2014 0534   LDLCALC 25 11/07/2014 0534    Lipase 184  Lactic Acid 0.79  Imaging/Diagnostic Tests: 6/21 CT Abd / Pelvis (w/o contrast = allergy) IMPRESSION: Diffuse pancreatic edema compatible with pancreatitis. No abscess or ascites Peristomal hernia on the right has improved since the prior study. No bowel obstruction.   Erasmo Downer, MD 11/07/2014, 9:48 AM PGY-1, [facility] FPTS Intern pager: [phone], text pages welcome

## 2014-11-07 NOTE — Consult Note (Signed)
WOC consult requested for left wrist/hand abscess prior to hand surgery consult.  Their team has assessed and plans to perform I&D in the OR this week.  Please refer to Dr [name] for further questions regarding plan of care. Please re-consult if further assistance is needed.  Thank-you,  Cammie Mcgee MSN, RN, CWOCN, [place], CNS [phone]

## 2014-11-07 NOTE — Telephone Encounter (Signed)
A doctor (Could not make out the name and didn't leave a number)  from [facility] call to let Dr. Karilyn Cota know [place] was in the hospital for drug induced pancreatis from antibiotics.

## 2014-11-07 NOTE — Progress Notes (Addendum)
Subjective: Osteomyelitis left hand, atypical mycobacterial infection   Objective: Vital signs in last 24 hours: Temp:  [97.6 F (36.4 C)-97.8 F (36.6 C)] 97.8 F (36.6 C) (06/22 0604) Pulse Rate:  [50-90] 66 (06/22 0604) Resp:  [16-20] 17 (06/22 0604) BP: (145-190)/(60-108) 158/60 mmHg (06/22 0604) SpO2:  [95 %-100 %] 95 % (06/22 0604) Weight:  [79 kg (174 lb 2.6 oz)] 79 kg (174 lb 2.6 oz) (06/21 1543)  Intake/Output from previous day: 06/21 0701 - 06/22 0700 In: 2500 [I.V.:2500] Out: 400 [Urine:400] Intake/Output this shift:     Recent Labs  11/06/14 1123 11/07/14 0534  HGB 11.7* 10.8*    Recent Labs  11/06/14 1123 11/07/14 0534  WBC 13.7* 7.0  RBC 3.57* 3.43*  HCT 35.3* 33.8*  PLT 165 128*    Recent Labs  11/06/14 1123 11/07/14 0534  NA 141 141  K 4.0 4.0  CL 113* 112*  CO2 23 23  BUN 18 11  CREATININE 0.86 0.77  GLUCOSE 75 51*  CALCIUM 8.3* 7.8*   No results for input(s): LABPT, INR in the last 72 hours.  No cellulitis present  Minimal pain abcess present dorsally  Assessment/Plan: Pt is going to require I&D nd would prefer to do at [facility] where they a re very familiar with staff. We will tentatively plan on I&D Fri afternoon.   Lee Klein 11/07/2014, 9:20 AM

## 2014-11-07 NOTE — Telephone Encounter (Signed)
Dr.Rehman was made aware. 

## 2014-11-08 ENCOUNTER — Other Ambulatory Visit: Payer: Self-pay | Admitting: Orthopedic Surgery

## 2014-11-08 ENCOUNTER — Encounter (HOSPITAL_BASED_OUTPATIENT_CLINIC_OR_DEPARTMENT_OTHER): Payer: Self-pay | Admitting: *Deleted

## 2014-11-08 DIAGNOSIS — A319 Mycobacterial infection, unspecified: Secondary | ICD-10-CM | POA: Insufficient documentation

## 2014-11-08 DIAGNOSIS — R1013 Epigastric pain: Secondary | ICD-10-CM

## 2014-11-08 DIAGNOSIS — E2740 Unspecified adrenocortical insufficiency: Secondary | ICD-10-CM

## 2014-11-08 LAB — BASIC METABOLIC PANEL
ANION GAP: 8 (ref 5–15)
BUN: <5 mg/dL — ABNORMAL LOW (ref 6–20)
CALCIUM: 8 mg/dL — AB (ref 8.9–10.3)
CO2: 23 mmol/L (ref 22–32)
Chloride: 111 mmol/L (ref 101–111)
Creatinine, Ser: 0.83 mg/dL (ref 0.61–1.24)
GFR calc Af Amer: >60 mL/min (ref >60)
Glucose, Bld: 34 mg/dL — CL (ref 65–99)
Potassium: 3.6 mmol/L (ref 3.5–5.1)
SODIUM: 142 mmol/L (ref 135–145)

## 2014-11-08 LAB — HEPATIC FUNCTION PANEL
ALBUMIN: 2 g/dL — AB (ref 3.5–5.0)
ALT: 47 U/L (ref 17–63)
AST: 36 U/L (ref 15–41)
Alkaline Phosphatase: 163 U/L — ABNORMAL HIGH (ref 38–126)
Bilirubin, Direct: 0.3 mg/dL (ref 0.1–0.5)
Indirect Bilirubin: 0.9 mg/dL (ref 0.3–0.9)
Total Bilirubin: 1.2 mg/dL (ref 0.3–1.2)
Total Protein: 4.4 g/dL — ABNORMAL LOW (ref 6.5–8.1)

## 2014-11-08 LAB — CBC
HCT: 33.5 % — ABNORMAL LOW (ref 39.0–52.0)
HEMOGLOBIN: 11.1 g/dL — AB (ref 13.0–17.0)
MCH: 33.2 pg (ref 26.0–34.0)
MCHC: 33.1 g/dL (ref 30.0–36.0)
MCV: 100.3 fL — AB (ref 78.0–100.0)
PLATELETS: 135 10*3/uL — AB (ref 150–400)
RBC: 3.34 MIL/uL — AB (ref 4.22–5.81)
RDW: 15.6 % — ABNORMAL HIGH (ref 11.5–15.5)
WBC: 7.1 10*3/uL (ref 4.0–10.5)

## 2014-11-08 LAB — GLUCOSE, CAPILLARY
GLUCOSE-CAPILLARY: 91 mg/dL (ref 65–99)
Glucose-Capillary: 100 mg/dL — ABNORMAL HIGH (ref 65–99)
Glucose-Capillary: 105 mg/dL — ABNORMAL HIGH (ref 65–99)
Glucose-Capillary: 105 mg/dL — ABNORMAL HIGH (ref 65–99)
Glucose-Capillary: 110 mg/dL — ABNORMAL HIGH (ref 65–99)
Glucose-Capillary: 20 mg/dL — CL (ref 65–99)

## 2014-11-08 LAB — LIPASE, BLOOD: Lipase: 86 U/L — ABNORMAL HIGH (ref 22–51)

## 2014-11-08 MED ORDER — AZITHROMYCIN 600 MG PO TABS
600.0000 mg | ORAL_TABLET | Freq: Every day | ORAL | Status: DC
  Administered 2014-11-08 – 2014-11-09 (×2): 600 mg via ORAL
  Filled 2014-11-08 (×2): qty 1

## 2014-11-08 MED ORDER — TIGECYCLINE 50 MG IV SOLR
50.0000 mg | Freq: Two times a day (BID) | INTRAVENOUS | Status: DC
  Administered 2014-11-08 – 2014-11-09 (×3): 50 mg via INTRAVENOUS
  Filled 2014-11-08 (×4): qty 50

## 2014-11-08 MED ORDER — HYDROCODONE-ACETAMINOPHEN 5-325 MG PO TABS
1.0000 | ORAL_TABLET | ORAL | Status: DC | PRN
  Administered 2014-11-08: 2 via ORAL
  Filled 2014-11-08: qty 2

## 2014-11-08 MED ORDER — DEXTROSE 50 % IV SOLN
INTRAVENOUS | Status: AC
  Administered 2014-11-08: 50 mL
  Filled 2014-11-08: qty 50

## 2014-11-08 MED ORDER — MORPHINE SULFATE 4 MG/ML IJ SOLN
4.0000 mg | INTRAMUSCULAR | Status: DC | PRN
  Administered 2014-11-08 – 2014-11-09 (×2): 4 mg via INTRAVENOUS
  Filled 2014-11-08 (×2): qty 1

## 2014-11-08 MED ORDER — SODIUM CHLORIDE 0.9 % IV SOLN: INTRAVENOUS | Status: DC

## 2014-11-08 MED ORDER — SODIUM CHLORIDE 0.9 % IV SOLN
INTRAVENOUS | Status: AC
  Administered 2014-11-08: 11:00:00 via INTRAVENOUS

## 2014-11-08 NOTE — Progress Notes (Signed)
Pre op phone call done with pt's wife.  Dr Ivin Booty notified that pt is still an inpt at [facility] and of critical lab results from this am.  Will d/w Dr [name].

## 2014-11-08 NOTE — Progress Notes (Signed)
Family Medicine Teaching Service Daily Progress Note Intern Pager: [phone]  Patient name: Lee Klein Medical record number: [id number] Date of birth: [birth year] Age: 64 y.o. Gender: male  Primary Care Provider: [name], MD Consultants: ID, Ortho Code Status: full code (confirmed on admission)  Pt Overview and Major Events to Date:  6/21 - admit for pancreatitis likely 2/2 antibiotic regimen for chronic L-hand osteo 6/22 - NPO, pain control, held abx per ID 6/23 - adv diet today, abd pain significantly improved, resume Tigecycline/Azithro today. Decision to postpone Ortho surgery L-hand I&D until next week  Assessment and Plan:  Lee Klein is a 64 y.o. male presenting with acute worsening epigastric abdominal pain and nausea (w/o vomiting), found to have acute pancreatitis. 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  No known prior episodes pancreatitis. Presumed trigger with complex antibiotic regimen (recently resumed Cefotixin vs possible chronic Tigecycline), per ID some association w/ pancreatitis. No significant alcohol recently, s/p cholecystectomy 2008. Lipids with TG 81. Differential included less likely Crohn's flare (stable on chronic prednisone, currently remission). No evidence bowel obstruction on CT, functioning ostomy Currently vitals stable, afebrile, no evidence complications on CT - Was NPO (sips + chips, meds) overnight - Advance to Clear Liquid diet now, continue ADAT today - Cont IVF NS @ 150cc/hr x 2 hours, then KVO if tolerating liquid diet - Pain control - Resume  home Norco PRN pain and Morphine PRN breakthrough. Note overnight switched Morphine to Dilaudid IV 0.5mg  q 3 hr - in 24 hr received 1mg  Dilaudid / 16mg  Morphine. - Phenergan IV 12.5mg  q 6 hr PRN nausea - ID following regarding antibiotic regimen recommendations  # Crohn's Disease, s/p ileostomy, without evidence of flare: Followed by Dr. Karilyn Cota ([facility]) chronically for Crohn's disease, s/p ileostomy. Last follow-up 08/2014, in remission without recent flare. Controlled on chronic prednisone. Ostomy continues to function, some decreased output recently (but reduced PO). CT Abd no evidence of acute flare or bowel obstruction. - Continue home Prednisone 5mg  PO daily - Primary GI outpatient (Dr. 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 [name]) for surgeri), and ID outpatient. Complex course since Feb 2016 with identified osteo and abscesses, antibiotics include Zyvox, Amikacin, Cefoxitin, Tigecycline, Azithromycin. Developed sudden ototoxicity on amikacin (DC x 1 month). Has PICC-RUE functional (placed within 1-2 weeks ago), current abx regimen: Tigecycline, Azithro, Cefoxitin (recently resumed, and suspected possible trigger for pancreatitis). - ID following, appreciate assistance - Resume Tigecycline and Azithro today per ID - Plan to start Clofazamine outpatient, Monday - Ortho following (Dr. [name]) decision to postpone Left hand I&D until next week, previously tentatively scheduled for tomorrow 6/24. Pending resolution of acute pancreatitis  # Anxiety - Continue home Xanax 0.5mg  qhs  # GERD - Continue home PPI, now NPO start Protonix IV BID  # Hearing loss, 2/2 antibiotic side-effect Chronic problem now, likely secondary to complication of aminoglycoside use, see above. - Followed by Audiology outpatient  # Adrenal Insufficiency, on chronic prednisone therapy: Chronic h/o prednisone therapy, previously with attempts to wean off  with failures and subsequent adrenal insufficiency. - Continue chronic home prednisone 5mg  PO daily  # Protein Calorie Malnutrition: Per  chart review, chronic problem with low albumin, PCP evaluating etiology of malnutrition for possible lower ext edema - Nutrition following  FEN/GI: Clear liquids >> ADAT, NS @ 150cc/hr while NPO >> KVO Prophylaxis: Lovenox  SQ daily  Disposition: Floor status, d/c pending improvement in pain control, diet advancement and abx regimen - Postponed L-hand Ortho I&D procedure until next week - anticipate discharge 1-2 days pending resolution of acute pancreatitis  Subjective:  Resting in bed comfortably. Reports 0 to 2 /10 abdominal pain, currently none. Denies nausea, vomiting, fevers/chills, CP, SOB. Some ostomy output (normal appearance) still slightly reduced. Feels hungry and ready to advance diet, tolerating water and ice chips. No pain medicine needed for several hours.  Objective: Temp:  [97.9 F (36.6 C)-98.3 F (36.8 C)] 97.9 F (36.6 C) (06/23 0505) Pulse Rate:  [73-84] 84 (06/23 0505) Resp:  [17-18] 17 (06/23 0505) BP: (143-161)/(63-67) 152/63 mmHg (06/23 0505) SpO2:  [95 %-96 %] 96 % (06/23 0505) Physical Exam: Gen - well-appearing, resting in bed, comfortable, NAD (due to deafness, wrote questions on white board at bedside, and history from wife) HEENT - MMM Heart - RRR, no murmurs heard Lungs - CTAB, no wheezing, crackles, or rhonchi. Normal work of breathing. Abd - soft, NTND, no rebound/guarding, ostomy normal with small amt liquid yellow-brown stool, +active BS Ext - Left Hand/Wrist - Wrapped with ACE. Other ext, non-tender, stable trace b/l LE pitting edema (L>R), peripheral pulses intact +2 b/l. RUE PICC line (no sign of infection) Skin - warm, dry, no rashes. Various superficial ecchymosis. Neuro - awake, alert, oriented  Laboratory:  Recent Labs Lab 11/06/14 1123 11/07/14 0534 11/08/14 0455  WBC 13.7* 7.0 7.1  HGB  11.7* 10.8* 11.1*  HCT 35.3* 33.8* 33.5*  PLT 165 128* 135*    Recent Labs Lab 11/06/14 1123 11/07/14 0534 11/08/14 0455  NA 141 141 142  K 4.0 4.0 3.6  CL 113* 112* 111  CO2 BUN 18 11 <5*  CREATININE 0.86 0.77 0.83  CALCIUM 8.3* 7.8* 8.0*  PROT 4.9*  --  4.4*  BILITOT 0.7  --  1.2  ALKPHOS 147*  --  163*  ALT 53  --  47  AST 39  --  36  GLUCOSE 75 51* 34*    Urinalysis    Component Value Date/Time   COLORURINE YELLOW 11/06/2014 1357   APPEARANCEUR CLEAR 11/06/2014 1357   LABSPEC 1.020 11/06/2014 1357   PHURINE 5.0 11/06/2014 1357   GLUCOSEU NEGATIVE 11/06/2014 1357   HGBUR NEGATIVE 11/06/2014 1357   BILIRUBINUR NEGATIVE 11/06/2014 1357   KETONESUR NEGATIVE 11/06/2014 1357   PROTEINUR NEGATIVE 11/06/2014 1357   UROBILINOGEN 0.2 11/06/2014 1357   NITRITE NEGATIVE 11/06/2014 1357   LEUKOCYTESUR MODERATE* 11/06/2014 1357    Lipid Panel     Component Value Date/Time   CHOL 88 11/07/2014 0534   TRIG 81 11/07/2014 0534   HDL 47 11/07/2014 0534   CHOLHDL 1.9 11/07/2014 0534   VLDL 16 11/07/2014 0534   LDLCALC 25 11/07/2014 0534    Lipase 184  Lactic Acid 0.79  UA - hyaline casts, neg hgb, mod leuks, neg nitrite, RBC 0-2, WBC 7-10  Imaging/Diagnostic Tests: 6/21 CT Abd / Pelvis (w/o contrast = allergy) IMPRESSION: Diffuse pancreatic edema compatible with pancreatitis. No abscess or ascites Peristomal hernia on the right has improved since the prior study. No bowel obstruction.  6/22 CXR 1v Portable IMPRESSION: Right PICC line tip SVC RA junction  Increased bibasilar atelectasis, worse  on the right.   Smitty Cords, DO 11/08/2014, 9:02 AM PGY-2, [facility] FPTS Intern pager: [phone], text pages welcome

## 2014-11-08 NOTE — Progress Notes (Signed)
Hypoglycemia protocol followed. Stat CBG 20  Pushed d50 CBG rechecked 105

## 2014-11-08 NOTE — Progress Notes (Signed)
CRITICAL VALUE ALERT  Critical value received:  Glucose 34  Date of notification: 11/08/2014  Time of notification:  0730  Critical value read back:Yes.    Nurse who received alert:  Danford Bad ,RN  MD notified (1st page): Paged [phone]  Time of first page:  [phone]  MD notified (2nd page):Dr. Nadine Counts  Time of second page:0740  Responding MD: Dr. Nadine Counts  Time MD responded: [phone]

## 2014-11-08 NOTE — Progress Notes (Signed)
    [facility]  Date of Admission:  11/06/2014 Late entry, seen in am Antibiotics: none  Subjective: Feels much better, hungry  Objective: Temp:  [97.9 F (36.6 C)-98.3 F (36.8 C)] 97.9 F (36.6 C) (06/23 0505) Pulse Rate:  [73-84] 84 (06/23 0505) Resp:  [17] 17 (06/23 0505) BP: (143-152)/(63-66) 152/63 mmHg (06/23 0505) SpO2:  [95 %-96 %] 96 % (06/23 0505)  General: awake, alert, nad Skin: no rasehs Lungs: CTA B Cor: RRR Abdomen: soft, nt   Lab Results Lab Results  Component Value Date   WBC 7.1 11/08/2014   HGB 11.1* 11/08/2014   HCT 33.5* 11/08/2014   MCV 100.3* 11/08/2014   PLT 135* 11/08/2014    Lab Results  Component Value Date   CREATININE 0.83 11/08/2014   BUN <5* 11/08/2014   NA 142 11/08/2014   K 3.6 11/08/2014   CL 111 11/08/2014   CO2 23 11/08/2014    Lab Results  Component Value Date   ALT 47 11/08/2014   AST 36 11/08/2014   ALKPHOS 163* 11/08/2014   BILITOT 1.2 11/08/2014      Microbiology: No results found for this or any previous visit (from the past 240 hour(s)).  Studies/Results: Dg Chest Port 1 View  11/07/2014   CLINICAL DATA:  Right upper extremity PICC line insertion  EXAM: PORTABLE CHEST - 1 VIEW  COMPARISON:  06/11/2014, 11/06/2014  FINDINGS: Right PICC line tip at the SVC RA junction level. Stable heart size and vascularity. Trachea is midline. Increased streaky bibasilar densities, compatible with atelectasis worse on the right. Negative for edema, significant effusion or pneumothorax. 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  IMPRESSION: Right PICC line tip SVC RA junction  Increased bibasilar atelectasis, worse on the right.   Electronically Signed   By: Judie Petit.  Shick M.D.   On: 11/07/2014 07:51    Assessment/Plan:  1) M abscessus osteomyelitis - since he is feeling better,lipase better, hungry, will restart tigecycline and  azithromycin.  He will start clofazamine next week at home if he is still feeling well.    Lee Righter, MD [facility] [email or web address] [phone] pager   [phone] cell 11/08/2014, 2:07 PM

## 2014-11-08 NOTE — Discharge Summary (Signed)
Family Medicine Teaching [facility] Discharge Summary  Patient name: Lee Klein Medical record number: [id number] Date of birth: [birth year] Age: 64 y.o. Gender: male Date of Admission: 11/06/2014  Date of Discharge: 11/09/14 Admitting Physician: Lee Ramp, MD  Primary Care Provider: [name], MD Consultants: ID, Ortho  Indication for Hospitalization: Epigastric Abdominal Pain  Discharge Diagnoses/Problem List:  Presumed Acute Pancreatitis, likely antibiotic induced (Tigecycline vs Cefoxitin) - Improved Chronic Osteomyelitis/Abscess atypical mycobacterium, Left Hand/wrist - Stable Crohn's Disease, s/p ileostomy, without evidence of acute flare - Stable Anxiety GERD Chronic hearing loss, 2/2 aminoglycoside side-effect H/o iatrogenic adrenal insufficiency, on chronic prednisone therapy Protein Calorie Malnutrition  Disposition: Home  Discharge Condition: Stable  Discharge Exam: See progress note from day of discharge  Brief Hospital Course:  Lee Klein is a 8 yr M who presented on 6/21 with worsening acute epigastric abdominal pain and decreased ostomy output x 1 week, /21. 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 Recent course, dx in Feb 2016, receiving home IV abx via R-PICC, current regimen Tigecycline, Azithromycin, Cefoxitin, followed by ID clinic. Recent change with newly resumed Cefoxitin about 1 week prior to arrival, and seemed to be associated with onset of abdominal pain symptoms.  In ED, patient with epigastric abdominal pain, found to have elevated lipase to 184, Abd CT confirmed acute pancreatitis without complication, no evidence of Crohn's flare. No prior pancreatitis (no significant alcohol and s/p choley), presumed trigger antibiotics (Tigecycline vs Cefoxitin). Admitted to FPTS for IV pain control and IVF hydration, NPO.  During hospitalization,  patient's abdominal pain improved gradually, ID consulted, held all antibiotics x 2 days, then resumed Tigecycline and Azithromycin on 6/23 after pain improved, decision to discontinue Cefoxitin and proceed with previous outpatient plan to start Clofazamine next week. Ortho consulted, initially had planned for repeat surgical I&D Left hand/wrist on 6/24, however patient was not cleared for discharge prior to this date and discussion with patient and Ortho, agreement to postpone I&D surgery until next week once pancreatitis resolved. Prior to discharge patient tolerating PO with only home Norco prn for pain control.  Issues for Follow Up:  1. F/u abd pain 2. Patient to have OP hand surgery next week with Dr. Rica Klein 3. Continue to monitor for side effects of antibiotics  Significant Procedures: none  Significant Labs and Imaging:   Recent Labs Lab 11/06/14 1123 11/07/14 0534 11/08/14 0455  WBC 13.7* 7.0 7.1  HGB 11.7* 10.8* 11.1*  HCT 35.3* 33.8* 33.5*  PLT 165 128* 135*    Recent Labs Lab 11/06/14 1123 11/07/14 0534 11/08/14 0455  NA 141 141 142  K 4.0 4.0 3.6  CL 113* 112* 111  CO2 GLUCOSE 75 51* 34*  BUN 18 11 <5*  CREATININE 0.86 0.77 0.83  CALCIUM 8.3* 7.8* 8.0*  ALKPHOS 147*  --  163*  AST 39  --  36  ALT 53  --  47  ALBUMIN 2.4*  --  2.0*    Lipase 184 > 86  Lactic Acid 0.79  UA - hyaline casts, neg hgb, mod leuks, neg nitrite, RBC 0-2, WBC 7-10  Imaging/Diagnostic Tests:  6/21 CT Abd / Pelvis (w/o contrast = allergy) IMPRESSION: Diffuse pancreatic edema compatible with pancreatitis. No abscess or ascites Peristomal hernia on the right has improved since the prior study. No bowel obstruction.  6/22 CXR 1v Portable IMPRESSION: Right PICC line tip SVC RA junction Increased  bibasilar atelectasis, worse on the right.  Results/Tests Pending at Time of Discharge: none  Discharge Medications:    Medication List    TAKE these medications         acetaminophen 325 MG tablet  Commonly known as:  TYLENOL  Take 650 mg by mouth every 6 (six) hours as needed (pain).     ALPRAZolam 0.5 MG tablet  Commonly known as:  XANAX  TAKE 1 TABLET BY MOUTH TWO TIMES DAILY AS NEEDED FOR ANXIETY     azithromycin 600 MG tablet  Commonly known as:  ZITHROMAX  Take 1 tablet (600 mg total) by mouth daily.     CALCIUM 600 + D PO  Take 1 tablet by mouth daily.     HYDROcodone-acetaminophen 10-325 MG per tablet  Commonly known as:  NORCO  Take 1 tablet by mouth every 6 (six) hours as needed.     loperamide 2 MG tablet  Commonly known as:  IMODIUM A-D  Take 8 mg by mouth 2 (two) times daily.     MULTIVITAMIN PO  Take 1 tablet by mouth daily.     nystatin 100000 UNIT/GM Powd  Apply topically daily as needed (FOR IRRITATION). PRN     pantoprazole 40 MG tablet  Commonly known as:  PROTONIX  Take 1 tablet (40 mg total) by mouth 2 (two) times daily before a meal.     predniSONE 5 MG tablet  Commonly known as:  DELTASONE  Take 5 mg by mouth daily with breakfast.     promethazine 25 MG tablet  Commonly known as:  PHENERGAN  Take 25 mg by mouth as needed for nausea or vomiting.     ranitidine 150 MG capsule  Commonly known as:  ZANTAC  Take 150 mg by mouth 2 (two) times daily.     tigecycline 50 mg in sodium chloride 0.9 % 100 mL  Inject 50 mg into the vein every 12 (twelve) hours.     TYGACIL IV  Inject 50 mg into the vein 2 (two) times daily.     vitamin C 1000 MG tablet  Take 1,000 mg by mouth daily.     Zinc 50 MG Caps  Take 1 capsule by mouth daily.        Discharge Instructions: Please refer to Patient Instructions section of EMR for full details.  Patient was counseled important signs and symptoms that should prompt return to medical care, changes in medications, dietary instructions, activity restrictions, and follow up appointments.   Follow-Up Appointments: Follow-up Information    Follow up with Lee Pandy, MD.  Schedule an appointment as soon as possible for a visit in 3 days.   Specialty:  Family Medicine   Why:  For hospital follow-up   Contact information:   [address] [place] [address] [phone]       Lee Downer, MD 11/10/2014, 9:04 PM PGY-1, [facility]

## 2014-11-09 ENCOUNTER — Encounter (HOSPITAL_BASED_OUTPATIENT_CLINIC_OR_DEPARTMENT_OTHER): Admission: RE | Payer: Self-pay | Source: Ambulatory Visit

## 2014-11-09 ENCOUNTER — Ambulatory Visit (HOSPITAL_BASED_OUTPATIENT_CLINIC_OR_DEPARTMENT_OTHER)
Admission: RE | Admit: 2014-11-09 | Payer: Managed Care, Other (non HMO) | Source: Ambulatory Visit | Admitting: Orthopedic Surgery

## 2014-11-09 ENCOUNTER — Other Ambulatory Visit: Payer: Self-pay | Admitting: Orthopedic Surgery

## 2014-11-09 ENCOUNTER — Encounter (HOSPITAL_BASED_OUTPATIENT_CLINIC_OR_DEPARTMENT_OTHER): Payer: Self-pay | Admitting: *Deleted

## 2014-11-09 LAB — GLUCOSE, CAPILLARY
GLUCOSE-CAPILLARY: 90 mg/dL (ref 65–99)
GLUCOSE-CAPILLARY: 74 mg/dL (ref 65–99)
GLUCOSE-CAPILLARY: 128 mg/dL — AB (ref 65–99)
Glucose-Capillary: 69 mg/dL (ref 65–99)

## 2014-11-09 SURGERY — INCISION AND DRAINAGE
Anesthesia: General | Laterality: Left

## 2014-11-09 MED ORDER — TIGECYCLINE 50 MG IV SOLR: 50.0000 mg | Freq: Two times a day (BID) | INTRAVENOUS | Status: DC

## 2014-11-09 MED ORDER — PANTOPRAZOLE SODIUM 40 MG PO TBEC
40.0000 mg | DELAYED_RELEASE_TABLET | Freq: Two times a day (BID) | ORAL | Status: DC
Start: 2014-11-09 — End: 2014-11-09

## 2014-11-09 MED ORDER — LOPERAMIDE HCL 2 MG PO CAPS
2.0000 mg | ORAL_CAPSULE | Freq: Once | ORAL | Status: AC
  Administered 2014-11-09: 2 mg via ORAL
  Filled 2014-11-09: qty 1

## 2014-11-09 MED ORDER — HEPARIN SOD (PORK) LOCK FLUSH 100 UNIT/ML IV SOLN
250.0000 [IU] | INTRAVENOUS | Status: AC | PRN
  Administered 2014-11-09 (×2): 250 [IU]

## 2014-11-09 MED ORDER — AZITHROMYCIN 600 MG PO TABS: 600.0000 mg | ORAL_TABLET | Freq: Every day | ORAL | Status: AC

## 2014-11-09 NOTE — Progress Notes (Signed)
Physical Therapy Wound Treatment Patient Details  Name: Lee Klein MRN: [id number] Date of Birth: Oct 07, 1950  Today's Date: 11/09/2014 Time: 9892-1194 Time Calculation (min): 34 min  Subjective  Subjective: Pt adm with pancreatitis. Pt with long term lt hand infection/wounds and is followed by hand surgeon. Pt was supposed to be seen in hand surgeon's office today for hydrotherapy and dressing change. Prior Treatments: Multiple I&D's   Pain Score: Pain Score: Asleep  Wound Assessment  Pt with 4 small wounds on left hand. Wound beds clean in all wounds. Received pulsatile lavage at 4 psi for 1000 ml. Used iodoform packing in 3 of the wounds. One wound too small for packing. Covered with guaze, compression glove, brace and ace wrap.     Wound Assessment and Plan  Wound Therapy - Assess/Plan/Recommendations Wound Therapy - Clinical Statement: Pt presents for 1 time hydrotherapy treatment of chronic left hand wounds while in hospital for other problems. Wound Therapy - Functional Problem List: Decr use of left hand Factors Delaying/Impairing Wound Healing: Infection - systemic/local;Multiple medical problems Hydrotherapy Plan: Pulsatile lavage with suction (1x) Wound Therapy - Frequency: Other (comment) (1 time) Wound Therapy - Follow Up Recommendations:  (MD office) Wound Plan: see above  Wound Therapy Goals- Improve the function of patient's integumentary system by progressing the wound(s) through the phases of wound healing (inflammation - proliferation - remodeling) by:    Goals will be updated until maximal potential achieved or discharge criteria met.  Discharge criteria: when goals achieved, discharge from hospital, MD decision/surgical intervention, no progress towards goals, refusal/missing three consecutive treatments without notification or medical reason.  GP     Aubrina Nieman 11/09/2014, 3:28 PM  [facility] PT [phone]

## 2014-11-09 NOTE — Progress Notes (Signed)
Patient discharged home with instructions given to wife, called Dr. Merrilee Seashore office to check for pt's appointment on Monday, spoke to Diplomatic Services operational officer and said MD's nurse will call wife about the details.

## 2014-11-09 NOTE — Progress Notes (Signed)
Family Medicine Teaching Service Daily Progress Note Intern Pager: [phone]  Patient name: Lee Klein Medical record number: [id number] Date of birth: 12-06-1950 Age: 64 y.o. Gender: male  Primary Care Provider: [name], MD Consultants: ID, Ortho Code Status: full code (confirmed on admission)  Pt Overview and Major Events to Date:  6/21 - admit for pancreatitis likely 2/2 antibiotic regimen for chronic L-hand osteo 6/22 - NPO, pain control, held abx per ID 6/23 - adv diet today, abd pain significantly improved, resume Tigecycline/Azithro today. Decision to postpone Ortho surgery L-hand I&D until next week  Assessment and Plan:  Lee Klein is a 64 y.o. male presenting with acute worsening epigastric abdominal pain and nausea (w/o vomiting), found to have acute pancreatitis. 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  No known prior episodes pancreatitis. Presumed trigger with complex antibiotic regimen (recently resumed Cefotixin vs possible chronic Tigecycline), per ID some association w/ pancreatitis. No significant alcohol recently, s/p cholecystectomy 2008. Lipids with TG 81. Differential included less likely Crohn's flare (stable on chronic prednisone, currently remission). No evidence bowel obstruction on CT, functioning ostomy Currently vitals stable, afebrile, no evidence complications on CT - Advanced to Clear Liquid diet 6/23, full liquids this AM, advance to soft diet for lunch, continue ADAT today - Pain control - home Norco PRN pain  - Phenergan IV 12.5mg  q 6 hr PRN nausea -  ID following regarding antibiotic regimen recommendations  # Crohn's Disease, s/p ileostomy, without evidence of flare: Followed by Dr. Karilyn Cota ([facility]) chronically for Crohn's disease, s/p ileostomy. Last follow-up 08/2014, in remission without recent flare. Controlled on chronic prednisone. Ostomy continues to function, some decreased output recently (but reduced PO). CT Abd no evidence of acute flare or bowel obstruction. - Continue home Prednisone  PO daily - Primary GI outpatient (Dr. 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 [name]) for surgery), and ID outpatient. Complex course since Feb 2016 with identified osteo and abscesses, antibiotics include Zyvox, Amikacin, Cefoxitin, Tigecycline, Azithromycin. Developed sudden ototoxicity on amikacin (DC x 1 month). Has PICC-RUE functional (placed within 1-2 weeks ago), current abx regimen: Tigecycline, Azithro, Cefoxitin (recently resumed, and suspected possible trigger for pancreatitis). - ID following, appreciate assistance - Resume Tigecycline and Azithro 6/23 per ID - Plan to start Clofazamine outpatient, Monday - Ortho following (Dr. [name]) decision to postpone Left hand I&D until next week. Pending resolution of acute pancreatitis  # Anxiety - Continue home Xanax 0.5mg  qhs  # GERD - Continue home PPI, now NPO start Protonix IV BID  # Hearing loss, 2/2 antibiotic side-effect Chronic problem now, likely secondary to complication of aminoglycoside use, see above. - Followed by Audiology outpatient  # Adrenal Insufficiency, on chronic prednisone therapy: Chronic h/o prednisone therapy, previously with attempts to wean off with failures and subsequent adrenal insufficiency. - Continue chronic home prednisone  PO daily  # Protein Calorie Malnutrition: Per chart review, chronic problem with low albumin, PCP evaluating etiology of malnutrition for possible lower ext  edema - Nutrition following  FEN/GI: soft diet >> ADAT, KVO Prophylaxis: Lovenox  SQ daily  Disposition: Floor status, d/c pending improvement in pain control, diet advancement  and abx regimen - possibly later today - Postponed L-hand Ortho I&D procedure until next week  Subjective:  Resting in bed comfortably. Reports abdominal pain has resolved. Denies nausea, vomiting, fevers/chills, CP, SOB. Some ostomy output (normal appearance) still slightly reduced.   Objective: Temp:  [98 F (36.7 C)-98.6 F (37 C)] 98.3 F (36.8 C) (06/24 0408) Pulse Rate:  [72-88] 88 (06/24 0408) Resp:  [16-17] 16 (06/24 0408) BP: (144-149)/(65-78) 144/78 mmHg (06/24 0408) SpO2:  [96 %-97 %] 97 % (06/24 0408) Physical Exam: Gen - well-appearing, resting in bed, comfortable, NAD (due to deafness, wrote questions on white board at bedside) HEENT - MMM Heart - RRR, no murmurs heard Lungs - CTAB, no wheezing, crackles, or rhonchi. Normal work of breathing. Abd - soft, NTND, no rebound/guarding, ostomy normal with small amt liquid yellow-brown stool, +active BS Ext - Left Hand/Wrist - Wrapped with ACE. Other ext, non-tender, stable trace b/l LE pitting edema (L>R), peripheral pulses intact +2 b/l. RUE PICC line (no sign of infection) Skin - warm, dry, no rashes. Various superficial ecchymosis. Neuro - awake, alert, oriented  Laboratory:  Recent Labs Lab 11/06/14 1123 11/07/14 0534 11/08/14 0455  WBC 13.7* 7.0 7.1  HGB 11.7* 10.8* 11.1*  HCT 35.3* 33.8* 33.5*  PLT 165 128* 135*    Recent Labs Lab 11/06/14 1123 11/07/14 0534 11/08/14 0455  NA 141 141 142  K 4.0 4.0 3.6  CL 113* 112* 111  CO2 23 23 23   BUN 18 11 <5*  CREATININE 0.86 0.77 0.83  CALCIUM 8.3* 7.8* 8.0*  PROT 4.9*  --  4.4*  BILITOT 0.7  --  1.2  ALKPHOS 147*  --  163*  ALT 53  --  47  AST 39  --  36  GLUCOSE 75 51* 34*    Urinalysis    Component Value Date/Time   COLORURINE YELLOW 11/06/2014 1357   APPEARANCEUR  CLEAR 11/06/2014 1357   LABSPEC 1.020 11/06/2014 1357   PHURINE 5.0 11/06/2014 1357   GLUCOSEU NEGATIVE 11/06/2014 1357   HGBUR NEGATIVE 11/06/2014 1357   BILIRUBINUR NEGATIVE 11/06/2014 1357   KETONESUR NEGATIVE 11/06/2014 1357   PROTEINUR NEGATIVE 11/06/2014 1357   UROBILINOGEN 0.2 11/06/2014 1357   NITRITE NEGATIVE 11/06/2014 1357   LEUKOCYTESUR MODERATE* 11/06/2014 1357    Lipid Panel     Component Value Date/Time   CHOL 88 11/07/2014 0534   TRIG 81 11/07/2014 0534   HDL 47 11/07/2014 0534   CHOLHDL 1.9 11/07/2014 0534   VLDL 16 11/07/2014 0534   LDLCALC 25 11/07/2014 0534    Lipase 184  Lactic Acid 0.79  UA - hyaline casts, neg hgb, mod leuks, neg nitrite, RBC 0-2, WBC 7-10  Imaging/Diagnostic Tests: 6/21 CT Abd / Pelvis (w/o contrast = allergy) IMPRESSION: Diffuse pancreatic edema compatible with pancreatitis. No abscess or ascites Peristomal hernia on the right has improved since the prior study. No bowel obstruction.  6/22 CXR 1v Portable IMPRESSION: Right PICC line tip SVC RA junction  Increased bibasilar atelectasis, worse on the right.   Erasmo Downer, MD 11/09/2014, 9:16 AM PGY-2, [facility] FPTS Intern pager: [phone], text pages welcome

## 2014-11-09 NOTE — Discharge Instructions (Signed)
You were admitted for pancreatitis, which was likely caused by an antibiotic.  This has improved.  Slowly advance your diet at home.  You can take Norco for pain after going home.  You will have your hand surgery next week.   Acute Pancreatitis Acute pancreatitis is a disease in which the pancreas becomes suddenly irritated (inflamed). The pancreas is a large gland behind your stomach. The pancreas makes enzymes that help digest food. The pancreas also makes 2 hormones that help control your blood sugar. Acute pancreatitis happens when the enzymes attack and damage the pancreas. Most attacks last a couple of days and can cause serious problems. HOME CARE  Follow your doctor's diet instructions. You may need to avoid alcohol and limit fat in your diet.  Eat small meals often.  Drink enough fluids to keep your pee (urine) clear or pale yellow.  Only take medicines as told by your doctor.  Avoid drinking alcohol if it caused your disease.  Do not smoke.  Get plenty of rest.  Check your blood sugar at home as told by your doctor.  Keep all doctor visits as told. GET HELP IF:  You do not get better as quickly as expected.  You have new or worsening symptoms.  You have lasting pain, weakness, or feel sick to your stomach (nauseous).  You get better and then have another pain attack. GET HELP RIGHT AWAY IF:   You are unable to eat or keep fluids down.  Your pain becomes severe.  You have a fever or lasting symptoms for more than 2 to 3 days.  You have a fever and your symptoms suddenly get worse.  Your skin or the white part of your eyes turn yellow (jaundice).  You throw up (vomit).  You feel dizzy, or you pass out (faint).  Your blood sugar is high (over 300 mg/dL). MAKE SURE YOU:   Understand these instructions.  Will watch your condition.  Will get help right away if you are not doing well or get worse. Document Released: 10/21/2007 Document Revised: 09/18/2013  Document Reviewed: 08/13/2011 [facility] Patient Information 2015 [place], [place]. This information is not intended to replace advice given to you by your health care provider. Make sure you discuss any questions you have with your health care provider.

## 2014-11-09 NOTE — Progress Notes (Signed)
Subjective:     Patient reports pain as mild.    Objective: Vital signs in last 24 hours: Temp:  [98 F (36.7 C)-98.6 F (37 C)] 98.3 F (36.8 C) (06/24 0408) Pulse Rate:  [72-88] 88 (06/24 0408) Resp:  [16-17] 16 (06/24 0408) BP: (144-149)/(65-78) 144/78 mmHg (06/24 0408) SpO2:  [96 %-97 %] 97 % (06/24 0408)  Intake/Output from previous day: 06/23 0701 - 06/24 0700 In: 970 [P.O.:960; I.V.:10] Out: 500 [Urine:500] Intake/Output this shift: Total I/O In: 480 [P.O.:480] Out: 500 [Urine:500]   Recent Labs  11/07/14 0534 11/08/14 0455  HGB 10.8* 11.1*    Recent Labs  11/07/14 0534 11/08/14 0455  WBC 7.0 7.1  RBC 3.43* 3.34*  HCT 33.8* 33.5*  PLT 128* 135*    Recent Labs  11/07/14 0534 11/08/14 0455  NA 141 142  K 4.0 3.6  CL 112* 111  CO2 23 23  BUN 11 <5*  CREATININE 0.77 0.83  GLUCOSE 51* 34*  CALCIUM 7.8* 8.0*   No results for input(s): LABPT, INR in the last 72 hours.  Neurologically intact  Assessment/Plan      Continue medical treatment for pancreatitis to maximize general condition and metabolic status I&D is not emergent with minimal pain and chronic infection with mycobacterial organism  Plan; therapy with repacking open wounds today Schedule I&D Monday with debridement as outpatient  Lee Klein 11/09/2014, 1:12 PM

## 2014-11-09 NOTE — Care Management Note (Addendum)
Case Management Note  Patient Details  Name: SENAY CAPOZZA MRN: [id number] Date of Birth: [birth year]  Subjective/Objective:                    Action/Plan:  Paged  Gottschalk MD for prescription for IV ABX  . Expected Discharge Date:       11-09-14           Expected Discharge Plan:  Home w Home Health Services  In-House Referral:     Discharge planning Services  CM Consult  Post Acute Care Choice:    Choice offered to:     DME Arranged:    DME Agency:     HH Arranged:  RN HH Agency:  [facility]  Status of Service:  Completed, signed off  Medicare Important Message Given:    Date Medicare IM Given:    Medicare IM give by:    Date Additional Medicare IM Given:    Additional Medicare Important Message give by:     If discussed at Long Length of Stay Meetings, dates discussed:    Additional Comments:  Kingsley Plan, RN 11/09/2014, 3:20 PM

## 2014-11-09 NOTE — Progress Notes (Signed)
    [facility]  Date of Admission:  11/06/2014  Antibiotics: tigecycline and azithromcyin  Subjective: Tolerating po  Objective: Temp:  [98 F (36.7 C)-98.6 F (37 C)] 98.3 F (36.8 C) (06/24 0408) Pulse Rate:  [72-88] 88 (06/24 0408) Resp:  [16-17] 16 (06/24 0408) BP: (144-149)/(65-78) 144/78 mmHg (06/24 0408) SpO2:  [96 %-97 %] 97 % (06/24 0408)  General: awake, alert, nad Skin: no rasehs Lungs: CTA B Cor: RRR Abdomen: soft, nt   Lab Results Lab Results  Component Value Date   WBC 7.1 11/08/2014   HGB 11.1* 11/08/2014   HCT 33.5* 11/08/2014   MCV 100.3* 11/08/2014   PLT 135* 11/08/2014    Lab Results  Component Value Date   CREATININE 0.83 11/08/2014   BUN <5* 11/08/2014   NA 142 11/08/2014   K 3.6 11/08/2014   CL 111 11/08/2014   CO2 23 11/08/2014    Lab Results  Component Value Date   ALT 47 11/08/2014   AST 36 11/08/2014   ALKPHOS 163* 11/08/2014   BILITOT 1.2 11/08/2014      Microbiology: No results found for this or any previous visit (from the past 240 hour(s)).  Studies/Results: No results found.  Assessment/Plan:  1) M abscessus osteomyelitis - feeling better.  May have been cefoxitin causing pancreatitis.  Back on tigecycline and azithromyin.  Will start clofazimine after surgical debridement next week.    Staci Righter, MD [email or web address] [phone] pager   [phone] cell 11/09/2014, 12:34 PM

## 2014-11-10 ENCOUNTER — Other Ambulatory Visit (INDEPENDENT_AMBULATORY_CARE_PROVIDER_SITE_OTHER): Payer: Self-pay | Admitting: Internal Medicine

## 2014-11-12 ENCOUNTER — Encounter (HOSPITAL_BASED_OUTPATIENT_CLINIC_OR_DEPARTMENT_OTHER): Payer: Self-pay | Admitting: Orthopedic Surgery

## 2014-11-12 ENCOUNTER — Ambulatory Visit (HOSPITAL_BASED_OUTPATIENT_CLINIC_OR_DEPARTMENT_OTHER)
Admission: RE | Admit: 2014-11-12 | Discharge: 2014-11-12 | Disposition: A | Discharge status: Home / Self Care | Payer: Managed Care, Other (non HMO) | Source: Ambulatory Visit | Attending: Orthopedic Surgery | Admitting: Orthopedic Surgery

## 2014-11-12 ENCOUNTER — Encounter (HOSPITAL_BASED_OUTPATIENT_CLINIC_OR_DEPARTMENT_OTHER)
Admission: RE | Disposition: A | Discharge status: Home / Self Care | Payer: Self-pay | Source: Ambulatory Visit | Attending: Orthopedic Surgery

## 2014-11-12 ENCOUNTER — Ambulatory Visit (HOSPITAL_BASED_OUTPATIENT_CLINIC_OR_DEPARTMENT_OTHER): Payer: Managed Care, Other (non HMO) | Admitting: Certified Registered"

## 2014-11-12 DIAGNOSIS — B9689 Other specified bacterial agents as the cause of diseases classified elsewhere: Secondary | ICD-10-CM | POA: Insufficient documentation

## 2014-11-12 DIAGNOSIS — Z885 Allergy status to narcotic agent status: Secondary | ICD-10-CM | POA: Insufficient documentation

## 2014-11-12 DIAGNOSIS — Z87891 Personal history of nicotine dependence: Secondary | ICD-10-CM | POA: Diagnosis not present

## 2014-11-12 DIAGNOSIS — Z91041 Radiographic dye allergy status: Secondary | ICD-10-CM | POA: Diagnosis not present

## 2014-11-12 DIAGNOSIS — Z91013 Allergy to seafood: Secondary | ICD-10-CM | POA: Diagnosis not present

## 2014-11-12 DIAGNOSIS — Z87442 Personal history of urinary calculi: Secondary | ICD-10-CM | POA: Diagnosis not present

## 2014-11-12 DIAGNOSIS — F419 Anxiety disorder, unspecified: Secondary | ICD-10-CM | POA: Insufficient documentation

## 2014-11-12 DIAGNOSIS — M1990 Unspecified osteoarthritis, unspecified site: Secondary | ICD-10-CM | POA: Diagnosis not present

## 2014-11-12 DIAGNOSIS — K219 Gastro-esophageal reflux disease without esophagitis: Secondary | ICD-10-CM | POA: Diagnosis not present

## 2014-11-12 DIAGNOSIS — M868X4 Other osteomyelitis, hand: Secondary | ICD-10-CM | POA: Diagnosis not present

## 2014-11-12 DIAGNOSIS — K5090 Crohn's disease, unspecified, without complications: Secondary | ICD-10-CM | POA: Insufficient documentation

## 2014-11-12 DIAGNOSIS — M868X3 Other osteomyelitis, forearm: Secondary | ICD-10-CM | POA: Insufficient documentation

## 2014-11-12 DIAGNOSIS — Z888 Allergy status to other drugs, medicaments and biological substances status: Secondary | ICD-10-CM | POA: Insufficient documentation

## 2014-11-12 DIAGNOSIS — L02414 Cutaneous abscess of left upper limb: Secondary | ICD-10-CM | POA: Insufficient documentation

## 2014-11-12 HISTORY — PX: INCISION AND DRAINAGE ABSCESS: SHX5864

## 2014-11-12 LAB — GLUCOSE, CAPILLARY: GLUCOSE-CAPILLARY: 65 mg/dL (ref 65–99)

## 2014-11-12 SURGERY — INCISION AND DRAINAGE, ABSCESS
Anesthesia: General | Site: Hand | Laterality: Left

## 2014-11-12 MED ORDER — CHLORHEXIDINE GLUCONATE 4 % EX LIQD: 60.0000 mL | Freq: Once | CUTANEOUS | Status: DC

## 2014-11-12 MED ORDER — LACTATED RINGERS IV SOLN
INTRAVENOUS | Status: DC
  Administered 2014-11-12: 12:00:00 via INTRAVENOUS

## 2014-11-12 MED ORDER — GLYCOPYRROLATE 0.2 MG/ML IJ SOLN: 0.2000 mg | Freq: Once | INTRAMUSCULAR | Status: DC | PRN

## 2014-11-12 MED ORDER — MIDAZOLAM HCL 2 MG/2ML IJ SOLN: 1.0000 mg | INTRAMUSCULAR | Status: DC | PRN

## 2014-11-12 MED ORDER — PROMETHAZINE HCL 25 MG/ML IJ SOLN
6.2500 mg | INTRAMUSCULAR | Status: DC | PRN
Start: 2014-11-12 — End: 2014-11-12

## 2014-11-12 MED ORDER — SCOPOLAMINE 1 MG/3DAYS TD PT72: 1.0000 | MEDICATED_PATCH | Freq: Once | TRANSDERMAL | Status: DC | PRN

## 2014-11-12 MED ORDER — SCOPOLAMINE 1 MG/3DAYS TD PT72
1.0000 | MEDICATED_PATCH | Freq: Once | TRANSDERMAL | Status: DC | PRN
Start: 2014-11-12 — End: 2014-11-12

## 2014-11-12 MED ORDER — HYDROMORPHONE HCL 1 MG/ML IJ SOLN
0.2500 mg | INTRAMUSCULAR | Status: DC | PRN
  Administered 2014-11-12 (×3): 0.5 mg via INTRAVENOUS

## 2014-11-12 MED ORDER — FENTANYL CITRATE (PF) 100 MCG/2ML IJ SOLN
50.0000 ug | INTRAMUSCULAR | Status: AC | PRN
  Administered 2014-11-12: 50 ug via INTRAVENOUS
  Administered 2014-11-12: 25 ug via INTRAVENOUS
  Administered 2014-11-12: 50 ug via INTRAVENOUS

## 2014-11-12 MED ORDER — HYDROMORPHONE HCL 1 MG/ML IJ SOLN
INTRAMUSCULAR | Status: AC
  Filled 2014-11-12: qty 1

## 2014-11-12 MED ORDER — MIDAZOLAM HCL 2 MG/2ML IJ SOLN
INTRAMUSCULAR | Status: AC
  Filled 2014-11-12: qty 2

## 2014-11-12 MED ORDER — DEXAMETHASONE SODIUM PHOSPHATE 10 MG/ML IJ SOLN
INTRAMUSCULAR | Status: DC | PRN
  Administered 2014-11-12: 10 mg via INTRAVENOUS

## 2014-11-12 MED ORDER — LACTATED RINGERS IV SOLN: INTRAVENOUS | Status: DC

## 2014-11-12 MED ORDER — HYDROCODONE-ACETAMINOPHEN 10-325 MG PO TABS: 1.0000 | ORAL_TABLET | Freq: Four times a day (QID) | ORAL | Status: DC | PRN

## 2014-11-12 MED ORDER — HYDROMORPHONE HCL 1 MG/ML IJ SOLN: 0.2500 mg | INTRAMUSCULAR | Status: DC | PRN

## 2014-11-12 MED ORDER — LIDOCAINE HCL (CARDIAC) 20 MG/ML IV SOLN
INTRAVENOUS | Status: DC | PRN
  Administered 2014-11-12: 30 mg via INTRAVENOUS

## 2014-11-12 MED ORDER — FENTANYL CITRATE (PF) 100 MCG/2ML IJ SOLN
INTRAMUSCULAR | Status: AC
  Filled 2014-11-12: qty 6

## 2014-11-12 MED ORDER — ONDANSETRON HCL 4 MG/2ML IJ SOLN
INTRAMUSCULAR | Status: DC | PRN
  Administered 2014-11-12: 4 mg via INTRAVENOUS

## 2014-11-12 MED ORDER — BUPIVACAINE HCL (PF) 0.25 % IJ SOLN
INTRAMUSCULAR | Status: DC | PRN
  Administered 2014-11-12: 2 mL

## 2014-11-12 MED ORDER — OXYCODONE-ACETAMINOPHEN 10-325 MG PO TABS: 1.0000 | ORAL_TABLET | ORAL | Status: DC | PRN

## 2014-11-12 MED ORDER — PROPOFOL 10 MG/ML IV BOLUS
INTRAVENOUS | Status: DC | PRN
  Administered 2014-11-12: 200 mg via INTRAVENOUS

## 2014-11-12 SURGICAL SUPPLY — 46 items
BAG DECANTER FOR FLEXI CONT (MISCELLANEOUS) IMPLANT
BLADE MINI RND TIP GREEN BEAV (BLADE) IMPLANT
BLADE SURG 15 STRL LF DISP TIS (BLADE) ×1 IMPLANT
BLADE SURG 15 STRL SS (BLADE) ×3
BNDG CMPR 9X4 STRL LF SNTH (GAUZE/BANDAGES/DRESSINGS)
BNDG COHESIVE 1X5 TAN STRL LF (GAUZE/BANDAGES/DRESSINGS) IMPLANT
BNDG COHESIVE 3X5 TAN STRL LF (GAUZE/BANDAGES/DRESSINGS) ×2 IMPLANT
BNDG ESMARK 4X9 LF (GAUZE/BANDAGES/DRESSINGS) IMPLANT
BNDG GAUZE ELAST 4 BULKY (GAUZE/BANDAGES/DRESSINGS) ×3 IMPLANT
CHLORAPREP W/TINT 26ML (MISCELLANEOUS) IMPLANT
CORDS BIPOLAR (ELECTRODE) ×3 IMPLANT
COVER BACK TABLE 60X90IN (DRAPES) ×3 IMPLANT
COVER MAYO STAND STRL (DRAPES) ×3 IMPLANT
CUFF TOURNIQUET SINGLE 18IN (TOURNIQUET CUFF) ×2 IMPLANT
DRAPE EXTREMITY T 121X128X90 (DRAPE) ×3 IMPLANT
DRAPE SURG 17X23 STRL (DRAPES) ×3 IMPLANT
DRSG KUZMA FLUFF (GAUZE/BANDAGES/DRESSINGS) ×1 IMPLANT
GAUZE PACKING IODOFORM 1/4X15 (GAUZE/BANDAGES/DRESSINGS) ×2 IMPLANT
GAUZE SPONGE 4X4 12PLY STRL (GAUZE/BANDAGES/DRESSINGS) ×3 IMPLANT
GAUZE XEROFORM 1X8 LF (GAUZE/BANDAGES/DRESSINGS) IMPLANT
GLOVE BIO SURGEON STRL SZ7 (GLOVE) ×2 IMPLANT
GLOVE BIOGEL PI IND STRL 8.5 (GLOVE) ×1 IMPLANT
GLOVE BIOGEL PI INDICATOR 8.5 (GLOVE) ×2
GLOVE EXAM NITRILE EXT CUFF MD (GLOVE) ×2 IMPLANT
GLOVE SURG ORTHO 8.0 STRL STRW (GLOVE) ×3 IMPLANT
GOWN STRL REUS W/ TWL LRG LVL3 (GOWN DISPOSABLE) ×1 IMPLANT
GOWN STRL REUS W/TWL LRG LVL3 (GOWN DISPOSABLE) ×3
GOWN STRL REUS W/TWL XL LVL3 (GOWN DISPOSABLE) ×3 IMPLANT
LOOP VESSEL MAXI BLUE (MISCELLANEOUS) IMPLANT
NEEDLE PRECISIONGLIDE 27X1.5 (NEEDLE) ×3 IMPLANT
NS IRRIG 1000ML POUR BTL (IV SOLUTION) ×3 IMPLANT
PACK BASIN DAY SURGERY FS (CUSTOM PROCEDURE TRAY) ×3 IMPLANT
PAD CAST 3X4 CTTN HI CHSV (CAST SUPPLIES) ×1 IMPLANT
PADDING CAST ABS 4INX4YD NS (CAST SUPPLIES)
PADDING CAST ABS COTTON 4X4 ST (CAST SUPPLIES) IMPLANT
PADDING CAST COTTON 3X4 STRL (CAST SUPPLIES) ×3
SPLINT PLASTER CAST XFAST 3X15 (CAST SUPPLIES) IMPLANT
SPLINT PLASTER XTRA FASTSET 3X (CAST SUPPLIES) ×20
STOCKINETTE 4X48 STRL (DRAPES) ×3 IMPLANT
SWAB COLLECTION DEVICE MRSA (MISCELLANEOUS) ×3 IMPLANT
SYR BULB 3OZ (MISCELLANEOUS) ×3 IMPLANT
SYR CONTROL 10ML LL (SYRINGE) ×2 IMPLANT
TOWEL OR 17X24 6PK STRL BLUE (TOWEL DISPOSABLE) ×3 IMPLANT
TUBE ANAEROBIC SPECIMEN COL (MISCELLANEOUS) ×3 IMPLANT
TUBE FEEDING 5FR 15 INCH (TUBING) IMPLANT
UNDERPAD 30X30 (UNDERPADS AND DIAPERS) ×3 IMPLANT

## 2014-11-12 NOTE — Op Note (Signed)
[id number] 

## 2014-11-12 NOTE — Anesthesia Procedure Notes (Signed)
Procedure Name: LMA Insertion Date/Time: 11/12/2014 12:37 PM Performed by: Alexias Margerum D Pre-anesthesia Checklist: Patient identified, Emergency Drugs available, Suction available and Patient being monitored Patient Re-evaluated:Patient Re-evaluated prior to inductionOxygen Delivery Method: Circle System Utilized Preoxygenation: Pre-oxygenation with 100% oxygen Intubation Type: IV induction Ventilation: Mask ventilation without difficulty LMA: LMA inserted LMA Size: 4.0 Number of attempts: 1 Airway Equipment and Method: Bite block Placement Confirmation: positive ETCO2 Tube secured with: Tape Dental Injury: Teeth and Oropharynx as per pre-operative assessment

## 2014-11-12 NOTE — Brief Op Note (Signed)
11/12/2014  1:10 PM  PATIENT:  Lee Klein  64 y.o. male  PRE-OPERATIVE DIAGNOSIS:  infection left hand  POST-OPERATIVE DIAGNOSIS:  infection left hand  PROCEDURE:  Procedure(s): INCISION AND DRAINAGE ABSCESS LEFT HAND (Left)  SURGEON:  Surgeon(s) and Role:    * Cindee Salt, MD - Primary  PHYSICIAN ASSISTANT:   ASSISTANTS: none   ANESTHESIA:   local and general  EBL:  Total I/O In: 400 [I.V.:400] Out: -   BLOOD ADMINISTERED:none  DRAINS: none   LOCAL MEDICATIONS USED:  BUPIVICAINE   SPECIMEN:  Source of Specimen:  cultures  DISPOSITION OF SPECIMEN:  PATHOLOGY  COUNTS:  YES  TOURNIQUET:   Total Tourniquet Time Documented: Upper Arm (Left) - 12 minutes Total: Upper Arm (Left) - 12 minutes   DICTATION: .Other Dictation: Dictation Number [id number]  PLAN OF CARE: Discharge to home after PACU  PATIENT DISPOSITION:  PACU - hemodynamically stable.

## 2014-11-12 NOTE — Transfer of Care (Signed)
Immediate Anesthesia Transfer of Care Note  Patient: Lee Klein  Procedure(s) Performed: Procedure(s): INCISION AND DRAINAGE ABSCESS LEFT HAND (Left)  Patient Location: PACU  Anesthesia Type:General  Level of Consciousness: awake, alert , oriented and patient cooperative  Airway & Oxygen Therapy: Patient Spontanous Breathing and Patient connected to face mask oxygen  Post-op Assessment: Report given to RN and Post -op Vital signs reviewed and stable  Post vital signs: Reviewed and stable  Last Vitals:  Filed Vitals:   11/12/14 1127  BP: 151/86  Pulse: 72  Temp: 36.7 C  Resp: 18    Complications: No apparent anesthesia complications

## 2014-11-12 NOTE — Anesthesia Postprocedure Evaluation (Signed)
  Anesthesia Post-op Note  Patient: Lee Klein  Procedure(s) Performed: Procedure(s): INCISION AND DRAINAGE ABSCESS LEFT HAND (Left)  Patient Location: PACU  Anesthesia Type:General  Level of Consciousness: awake  Airway and Oxygen Therapy: Patient Spontanous Breathing  Post-op Pain: mild  Post-op Assessment: Post-op Vital signs reviewed              Post-op Vital Signs: Reviewed  Last Vitals:  Filed Vitals:   11/12/14 1500  BP: 133/90  Pulse: 72  Temp: 36.6 C  Resp: 16    Complications: No apparent anesthesia complications

## 2014-11-12 NOTE — Anesthesia Preprocedure Evaluation (Addendum)
Anesthesia Evaluation  Patient identified by MRN, date of birth, ID band Patient awake    Reviewed: Allergy & Precautions, NPO status , Patient's Chart, lab work & pertinent test results  Airway Mallampati: II  TM Distance: >3 FB Neck ROM: Full    Dental   Pulmonary former smoker,  breath sounds clear to auscultation        Cardiovascular negative cardio ROS  Rhythm:Regular Rate:Normal     Neuro/Psych Anxiety    GI/Hepatic Neg liver ROS, GERD-  ,  Endo/Other  negative endocrine ROS  Renal/GU Renal disease     Musculoskeletal  (+) Arthritis -,   Abdominal   Peds  Hematology   Anesthesia Other Findings   Reproductive/Obstetrics                            Anesthesia Physical Anesthesia Plan  ASA: III  Anesthesia Plan: General   Post-op Pain Management:    Induction: Intravenous  Airway Management Planned: LMA  Additional Equipment:   Intra-op Plan:   Post-operative Plan: Extubation in OR  Informed Consent: I have reviewed the patients History and Physical, chart, labs and discussed the procedure including the risks, benefits and alternatives for the proposed anesthesia with the patient or authorized representative who has indicated his/her understanding and acceptance.   Dental advisory given  Plan Discussed with: CRNA and Anesthesiologist  Anesthesia Plan Comments:        Anesthesia Quick Evaluation

## 2014-11-12 NOTE — H&P (Signed)
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  He has had multiple drainages performed.  He is seen in therapy with accumulation and abscess beginning to point on the dorsal abscess of his left wrist. This began over the weekend.  He is presently on Zithromax and Tygacil.  He is treated for this by infectious disease.  He has history of Crohn's disease necessitating  long term immunosuppressants.  He was seen after being referred by Dr. [name] with an 8 month history of swelling of his right wrist.  He underwent arthroscopy with partial synovectomy for a culture and diagnosis. This revealed the atypical mycobacteria.  Since then he has had multiple incision and drainages. He was recently admitted for acute pancreatitis.  ALLERGIES:   Demerol, IVP dye.  MEDICATIONS:  Protonix, multivitamins, antibiotics.  SURGICAL HISTORY:   He has had 7 colon operations, hernia repair and multiple I&D's of his left hand.  FAMILY MEDICAL HISTORY:  Positive for arthritis.  SOCIAL HISTORY:   He does not smoke or drink.  He is married and an Teacher, adult education.    REVIEW OF SYSTEMS:     Positive for glasses, Crohn's disease. Lee Klein is an 64 y.o. male.   Chief Complaint: atypical mycobacterial infection left hand HPI: see above  Past Medical History  Diagnosis Date  . Crohn's disease   . Hernia, abdominal     "repaired 10/2013"  . GERD (gastroesophageal reflux disease)   . Anxiety   . 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 Kidney stones     "passed them"  . PICC (peripherally inserted central catheter) in place     right arm for home antibiotics  . Osteomyelitis of hand, left, acute   . Hearing loss   . Pancreatitis 10/2014    Past Surgical History  Procedure Laterality Date  . Ileostomy  06/2010; 10/2013  . Colonoscopy    . Upper gastrointestinal endoscopy    . Appendectomy  ~ 2009  . Ileostomy  closure N/A 10/23/2013    Procedure: ILEOSTOMY TAKEDOWN, REPAIR OF PARASTOMAL HERNIA.;  Surgeon: Cherylynn Ridges, MD;  [facility];  Service: General;  Laterality: N/A;  . Wrist arthroscopy with debridement Left 05/24/2014    Procedure: LEFT ARTHROSCOPY WRIST CULTURE/BIOPSY DEBRIDEMENT;  Surgeon: Cindee Salt, MD;  Location: [facility];  Service: Orthopedics;  Laterality: Left;  . Wrist fracture surgery Right 06/2006    [name] 09/29/2010  . Esophagogastroduodenoscopy N/A 06/15/2014    Procedure: ESOPHAGOGASTRODUODENOSCOPY (EGD);  Surgeon: Malissa Hippo, MD;  [facility];  Service: Endoscopy;  Laterality: N/A;  . Cholecystectomy  ~ 2008  . Tonsillectomy  1963  . Fracture surgery    . Hernia repair  10/2013    parastomal/notes 10/03/2013  . Colon surgery  ~ 2010    for a colonic stricture at the anastomosis from a colostomy reversal/notes 10/03/2013  . Parastomal hernia repair  10/2013  . Incision and drainage abscess Left 06/22/2014    Procedure: INCISION, DEBRIDEMENT AND IRRIGATION LEFT HAND/WRIST;  Surgeon: Cindee Salt, MD;  Location: [facility];  Service: Orthopedics;  Laterality: Left;  . Incision and drainage of wound Left 07/27/2014    Procedure: IRRIGATION AND DEBRIDEMENT WOUND;  Surgeon: Cindee Salt, MD;  Location: [facility];  Service: Orthopedics;  Laterality: Left;  . Incision and drainage abscess Left 08/09/2014  Procedure: INCISION AND DRAINAGE LEFT WRIST;  Surgeon: Candace Begue, MD;  Location: [facility];  Service: Orthopedics;  Laterality: Left;  . Incision and drainage abscess Left 09/05/2014    Procedure: INCISION AND DRAINAGE LEFT HAND;  Surgeon: Matthews Franks, MD;  Location: [facility];  Service: Orthopedics;  Laterality: Left;  . I&d extremity Left 10/02/2014    Procedure: IRRIGATION AND DEBRIDEMENT  LEFT HAND;  Surgeon: Faren Florence, MD;  Location: [facility];  Service: Orthopedics;   Laterality: Left;  . I&d extremity Left 10/12/2014    Procedure: MINOR IRRIGATION AND DEBRIDEMENT LEFT HAND;  Surgeon: Katie Faraone, MD;  Location:  SURGERY CENTER;  Service: Orthopedics;  Laterality: Left;    Family History  Problem Relation Age of Onset  . Healthy Mother   . Healthy Daughter   . Healthy Son    Social History:  reports that he quit smoking about 8 years ago. His smoking use included Cigars. He has never used smokeless tobacco. He reports that he drinks alcohol. He reports that he does not use illicit drugs.  Allergies:  Allergies  Allergen Reactions  . Amikacin     Caused pt to become deaf.  . Iodine Nausea Only    Other reaction(s): Other Joint pain  . Ivp Dye [Iodinated Diagnostic Agents]     Joint pain, nausea  . Fish Allergy Nausea And Vomiting  . Rifampin Nausea And Vomiting and Other (See Comments)    Chills  . Shellfish Allergy Swelling  . Demerol Rash    No prescriptions prior to admission    No results found for this or any previous visit (from the past 48 hour(s)).  No results found.   Pertinent items are noted in HPI.  There were no vitals taken for this visit.  General appearance: alert, cooperative and appears stated age Head: Normocephalic, without obvious abnormality Neck: no JVD Resp: clear to auscultation bilaterally Cardio: regular rate and rhythm, S1, S2 normal, no murmur, click, rub or gallop GI: soft, non-tender; bowel sounds normal; no masses,  no organomegaly Extremities: abcess left hand Pulses: 2+ and symmetric Skin: Skin color, texture, turgor normal. No rashes or lesions Neurologic: Grossly normal Incision/Wound: open  Assessment/Plan PLAN:   Incision and drainage left hand.    He is aware of risks and complications including injury to arteries, nerves, tendons, and stiffness.  He is well aware of osteomyelitis.  Chantell Kunkler R 11/12/2014, 10:14 AM     

## 2014-11-12 NOTE — Discharge Instructions (Addendum)

## 2014-11-13 ENCOUNTER — Encounter (HOSPITAL_BASED_OUTPATIENT_CLINIC_OR_DEPARTMENT_OTHER): Payer: Self-pay | Admitting: Orthopedic Surgery

## 2014-11-13 NOTE — Op Note (Signed)
NAMEDUNBAR, BURAS                ACCOUNT NO.:  [id number]  MEDICAL RECORD NO.:  [id number]  LOCATION:                               FACILITY:  [facility]  PHYSICIAN:  Cindee Salt, M.D.       DATE OF BIRTH:  11-17-1950  DATE OF PROCEDURE:  11/12/2014 DATE OF DISCHARGE:  11/12/2014                              OPERATIVE REPORT   PREOPERATIVE DIAGNOSIS:  Atypical mycobacterial infection, left hand.  POSTOPERATIVE DIAGNOSIS:  Atypical mycobacterial infection, left hand.  OPERATION:  Incision and drainage, left wrist.  SURGEON:  Cindee Salt, M.D.  ANESTHESIA:  General with local infiltration.  ANESTHESIOLOGIST:  Kaylyn Layer. Michelle Piper, M.D.  HISTORY:  The patient is a 64 year old male with a history of atypical Mycobacterium abscessus, which has been treated with multiple incision and drainages.  He has osteomyelitis of multiple bones in his carpus, metacarpals, distal radius, distal ulna, has recently undergone a hospital admission for acute pancreatitis, has had swelling of his wrist and is now admitted for incision and drainage.  He has two other open areas draining.  He is plan on cultures.  He is on tigecycline and other antibiotics to treat his atypical mycobacterial under the direction of Infectious Disease.  Pre, peri, and postoperative course are well known. He is aware of the possibility of a superinfection and injury to arteries, nerves, tendons, further drainage is being necessary.  In the preoperative area, the patient is seen, the extremity marked by both patient and surgeon.  DESCRIPTION OF PROCEDURE:  The patient was brought to the operating room, where general anesthetic was carried out without difficulty.  He was prepped using Betadine scrub and solution with the left arm free. Time-out taken, confirming the patient and procedure.  The limb was elevated for exsanguination.  Tourniquet placed high on the arm was inflated to 250 mmHg.  Area of incision was performed over the  dorsal mid portion of his distal radius and ulna, carried down through the subcutaneous tissue, entered through the fourth dorsal compartment and followed down to the wrist joint where a large amount of fluid was immediately expressed.  Cultures were taken for both aerobic, anaerobic and mycobacterium.  The wound was then copiously irrigated with saline, debrided with a rongeur to ensure opening, this was then packed with Iodoform gauze.  A separate opening distally to this was irrigated, clear and packed.  A third open area had sealed over.  This was not opened back up on his basal area of his thumb.  A sterile compressive dressing, volar splint was applied.  This was done with the fingers free.  Prior to placement of the dressing, the area was locally infiltrated with 0.25% bupivacaine without epinephrine, approximately 5-6 mL was used.  On deflation of the tourniquet, all fingers were immediately pinked.  He was taken to the recovery room for observation in satisfactory condition.  He will be discharged to home to return to the [facility] [place] in 1 week, on Percocet.          ______________________________ Cindee Salt, M.D.     GK/MEDQ  D:  11/12/2014  T:  11/13/2014  Job:  [id number]

## 2014-11-15 LAB — WOUND CULTURE: Culture: NO GROWTH

## 2014-11-17 LAB — ANAEROBIC CULTURE

## 2014-11-21 ENCOUNTER — Encounter: Payer: Self-pay | Admitting: Infectious Disease

## 2014-11-22 ENCOUNTER — Telehealth: Payer: Self-pay | Admitting: *Deleted

## 2014-11-22 NOTE — Telephone Encounter (Signed)
AST/ALT trending upward reported by [facility].  Wanted to make sure that Dr. Luciana Axe was aware.

## 2014-11-22 NOTE — Telephone Encounter (Signed)
Dr. Daiva Eves has been managing this so will let him know.

## 2014-11-26 ENCOUNTER — Other Ambulatory Visit: Payer: Self-pay | Admitting: Orthopedic Surgery

## 2014-11-26 NOTE — Telephone Encounter (Signed)
Can we get labs from today faxed to me?

## 2014-11-26 NOTE — Telephone Encounter (Signed)
Labs have not been drawn yet this week, 11/26/14.  [facility] stated they was send them when available.

## 2014-11-27 ENCOUNTER — Encounter: Payer: Self-pay | Admitting: Infectious Disease

## 2014-11-27 ENCOUNTER — Ambulatory Visit (HOSPITAL_BASED_OUTPATIENT_CLINIC_OR_DEPARTMENT_OTHER)
Admission: RE | Admit: 2014-11-27 | Discharge: 2014-11-27 | Disposition: A | Discharge status: Home / Self Care | Payer: Managed Care, Other (non HMO) | Source: Ambulatory Visit | Attending: Orthopedic Surgery | Admitting: Orthopedic Surgery

## 2014-11-27 ENCOUNTER — Ambulatory Visit (HOSPITAL_BASED_OUTPATIENT_CLINIC_OR_DEPARTMENT_OTHER): Payer: Managed Care, Other (non HMO) | Admitting: Anesthesiology

## 2014-11-27 ENCOUNTER — Encounter (HOSPITAL_BASED_OUTPATIENT_CLINIC_OR_DEPARTMENT_OTHER)
Admission: RE | Disposition: A | Discharge status: Home / Self Care | Payer: Self-pay | Source: Ambulatory Visit | Attending: Orthopedic Surgery

## 2014-11-27 ENCOUNTER — Encounter (HOSPITAL_BASED_OUTPATIENT_CLINIC_OR_DEPARTMENT_OTHER): Payer: Self-pay | Admitting: Orthopedic Surgery

## 2014-11-27 DIAGNOSIS — A319 Mycobacterial infection, unspecified: Secondary | ICD-10-CM | POA: Insufficient documentation

## 2014-11-27 DIAGNOSIS — M869 Osteomyelitis, unspecified: Secondary | ICD-10-CM | POA: Insufficient documentation

## 2014-11-27 DIAGNOSIS — F419 Anxiety disorder, unspecified: Secondary | ICD-10-CM | POA: Diagnosis not present

## 2014-11-27 DIAGNOSIS — Z883 Allergy status to other anti-infective agents status: Secondary | ICD-10-CM | POA: Insufficient documentation

## 2014-11-27 DIAGNOSIS — Z885 Allergy status to narcotic agent status: Secondary | ICD-10-CM | POA: Insufficient documentation

## 2014-11-27 DIAGNOSIS — Z87891 Personal history of nicotine dependence: Secondary | ICD-10-CM | POA: Insufficient documentation

## 2014-11-27 DIAGNOSIS — Z91013 Allergy to seafood: Secondary | ICD-10-CM | POA: Diagnosis not present

## 2014-11-27 DIAGNOSIS — K219 Gastro-esophageal reflux disease without esophagitis: Secondary | ICD-10-CM | POA: Insufficient documentation

## 2014-11-27 DIAGNOSIS — Z87442 Personal history of urinary calculi: Secondary | ICD-10-CM | POA: Diagnosis not present

## 2014-11-27 DIAGNOSIS — Z91041 Radiographic dye allergy status: Secondary | ICD-10-CM | POA: Diagnosis not present

## 2014-11-27 DIAGNOSIS — H9190 Unspecified hearing loss, unspecified ear: Secondary | ICD-10-CM | POA: Insufficient documentation

## 2014-11-27 DIAGNOSIS — K5090 Crohn's disease, unspecified, without complications: Secondary | ICD-10-CM | POA: Insufficient documentation

## 2014-11-27 DIAGNOSIS — Z888 Allergy status to other drugs, medicaments and biological substances status: Secondary | ICD-10-CM | POA: Diagnosis not present

## 2014-11-27 HISTORY — PX: INCISION AND DRAINAGE: SHX5863

## 2014-11-27 LAB — POCT I-STAT, CHEM 8
BUN: 16 mg/dL (ref 6–20)
Calcium, Ion: 1.23 mmol/L (ref 1.13–1.30)
Chloride: 112 mmol/L — ABNORMAL HIGH (ref 101–111)
Creatinine, Ser: 1 mg/dL (ref 0.61–1.24)
Glucose, Bld: 79 mg/dL (ref 65–99)
HCT: 34 % — ABNORMAL LOW (ref 39.0–52.0)
Hemoglobin: 11.6 g/dL — ABNORMAL LOW (ref 13.0–17.0)
POTASSIUM: 4.4 mmol/L (ref 3.5–5.1)
Sodium: 141 mmol/L (ref 135–145)
TCO2: 19 mmol/L (ref 0–100)

## 2014-11-27 SURGERY — INCISION AND DRAINAGE
Anesthesia: General | Site: Hand | Laterality: Left

## 2014-11-27 MED ORDER — LIDOCAINE HCL (CARDIAC) 20 MG/ML IV SOLN
INTRAVENOUS | Status: DC | PRN
  Administered 2014-11-27: 50 mg via INTRAVENOUS

## 2014-11-27 MED ORDER — LACTATED RINGERS IV SOLN
INTRAVENOUS | Status: DC
  Administered 2014-11-27: 12:00:00 via INTRAVENOUS

## 2014-11-27 MED ORDER — CHLORHEXIDINE GLUCONATE 4 % EX LIQD: 60.0000 mL | Freq: Once | CUTANEOUS | Status: DC

## 2014-11-27 MED ORDER — HYDROCODONE-ACETAMINOPHEN 10-325 MG PO TABS: 1.0000 | ORAL_TABLET | Freq: Four times a day (QID) | ORAL | Status: DC | PRN

## 2014-11-27 MED ORDER — HYDROMORPHONE HCL 1 MG/ML IJ SOLN
INTRAMUSCULAR | Status: AC
  Filled 2014-11-27: qty 1

## 2014-11-27 MED ORDER — PROPOFOL 10 MG/ML IV BOLUS
INTRAVENOUS | Status: DC | PRN
  Administered 2014-11-27: 150 mg via INTRAVENOUS

## 2014-11-27 MED ORDER — GLYCOPYRROLATE 0.2 MG/ML IJ SOLN: 0.2000 mg | Freq: Once | INTRAMUSCULAR | Status: DC | PRN

## 2014-11-27 MED ORDER — ONDANSETRON HCL 4 MG/2ML IJ SOLN
INTRAMUSCULAR | Status: DC | PRN
  Administered 2014-11-27: 4 mg via INTRAVENOUS

## 2014-11-27 MED ORDER — DEXAMETHASONE SODIUM PHOSPHATE 4 MG/ML IJ SOLN
INTRAMUSCULAR | Status: DC | PRN
  Administered 2014-11-27: 10 mg via INTRAVENOUS

## 2014-11-27 MED ORDER — MIDAZOLAM HCL 2 MG/2ML IJ SOLN
1.0000 mg | INTRAMUSCULAR | Status: DC | PRN
Start: 2014-11-27 — End: 2014-11-27
  Administered 2014-11-27: 2 mg via INTRAVENOUS

## 2014-11-27 MED ORDER — OXYCODONE HCL 5 MG/5ML PO SOLN: 5.0000 mg | Freq: Once | ORAL | Status: DC | PRN

## 2014-11-27 MED ORDER — HYDROMORPHONE HCL 1 MG/ML IJ SOLN
0.2500 mg | INTRAMUSCULAR | Status: DC | PRN
  Administered 2014-11-27 (×3): 0.5 mg via INTRAVENOUS

## 2014-11-27 MED ORDER — FENTANYL CITRATE (PF) 100 MCG/2ML IJ SOLN
INTRAMUSCULAR | Status: AC
  Filled 2014-11-27: qty 4

## 2014-11-27 MED ORDER — PROPOFOL 10 MG/ML IV BOLUS
INTRAVENOUS | Status: AC
  Filled 2014-11-27: qty 20

## 2014-11-27 MED ORDER — MIDAZOLAM HCL 2 MG/2ML IJ SOLN
INTRAMUSCULAR | Status: AC
  Filled 2014-11-27: qty 2

## 2014-11-27 MED ORDER — MEPERIDINE HCL 25 MG/ML IJ SOLN: 6.2500 mg | INTRAMUSCULAR | Status: DC | PRN

## 2014-11-27 MED ORDER — OXYCODONE HCL 5 MG PO TABS: 5.0000 mg | ORAL_TABLET | Freq: Once | ORAL | Status: DC | PRN

## 2014-11-27 MED ORDER — FENTANYL CITRATE (PF) 100 MCG/2ML IJ SOLN
50.0000 ug | INTRAMUSCULAR | Status: DC | PRN
  Administered 2014-11-27 (×2): 50 ug via INTRAVENOUS

## 2014-11-27 MED ORDER — SCOPOLAMINE 1 MG/3DAYS TD PT72: 1.0000 | MEDICATED_PATCH | Freq: Once | TRANSDERMAL | Status: DC | PRN

## 2014-11-27 SURGICAL SUPPLY — 53 items
BAG DECANTER FOR FLEXI CONT (MISCELLANEOUS) IMPLANT
BLADE MINI RND TIP GREEN BEAV (BLADE) IMPLANT
BLADE SURG 15 STRL LF DISP TIS (BLADE) ×1 IMPLANT
BLADE SURG 15 STRL SS (BLADE) ×3
BNDG CMPR 9X4 STRL LF SNTH (GAUZE/BANDAGES/DRESSINGS)
BNDG COHESIVE 1X5 TAN STRL LF (GAUZE/BANDAGES/DRESSINGS) IMPLANT
BNDG COHESIVE 3X5 TAN STRL LF (GAUZE/BANDAGES/DRESSINGS) IMPLANT
BNDG ESMARK 4X9 LF (GAUZE/BANDAGES/DRESSINGS) IMPLANT
BNDG GAUZE ELAST 4 BULKY (GAUZE/BANDAGES/DRESSINGS) IMPLANT
CHLORAPREP W/TINT 26ML (MISCELLANEOUS) ×3 IMPLANT
CONT SPEC 4OZ CLIKSEAL STRL BL (MISCELLANEOUS) ×2 IMPLANT
CORDS BIPOLAR (ELECTRODE) ×3 IMPLANT
COVER BACK TABLE 60X90IN (DRAPES) ×3 IMPLANT
COVER MAYO STAND STRL (DRAPES) ×3 IMPLANT
CUFF TOURNIQUET SINGLE 18IN (TOURNIQUET CUFF) ×2 IMPLANT
DRAPE EXTREMITY T 121X128X90 (DRAPE) ×3 IMPLANT
DRAPE SURG 17X23 STRL (DRAPES) ×3 IMPLANT
DRSG KUZMA FLUFF (GAUZE/BANDAGES/DRESSINGS) ×1 IMPLANT
DRSG PAD ABDOMINAL 8X10 ST (GAUZE/BANDAGES/DRESSINGS) ×4 IMPLANT
GAUZE PACKING IODOFORM 1/4X15 (GAUZE/BANDAGES/DRESSINGS) ×2 IMPLANT
GAUZE SPONGE 4X4 12PLY STRL (GAUZE/BANDAGES/DRESSINGS) ×3 IMPLANT
GAUZE XEROFORM 1X8 LF (GAUZE/BANDAGES/DRESSINGS) ×3 IMPLANT
GLOVE BIO SURGEON STRL SZ 6.5 (GLOVE) ×1 IMPLANT
GLOVE BIO SURGEONS STRL SZ 6.5 (GLOVE) ×1
GLOVE BIOGEL PI IND STRL 7.0 (GLOVE) ×2 IMPLANT
GLOVE BIOGEL PI IND STRL 8.5 (GLOVE) ×1 IMPLANT
GLOVE BIOGEL PI INDICATOR 7.0 (GLOVE) ×4
GLOVE BIOGEL PI INDICATOR 8.5 (GLOVE) ×2
GLOVE SURG ORTHO 8.0 STRL STRW (GLOVE) ×3 IMPLANT
GOWN STRL REUS W/ TWL LRG LVL3 (GOWN DISPOSABLE) ×1 IMPLANT
GOWN STRL REUS W/TWL LRG LVL3 (GOWN DISPOSABLE) ×3
GOWN STRL REUS W/TWL XL LVL3 (GOWN DISPOSABLE) ×3 IMPLANT
LOOP VESSEL MAXI BLUE (MISCELLANEOUS) IMPLANT
NDL PRECISIONGLIDE 27X1.5 (NEEDLE) IMPLANT
NEEDLE PRECISIONGLIDE 27X1.5 (NEEDLE) IMPLANT
NS IRRIG 1000ML POUR BTL (IV SOLUTION) ×3 IMPLANT
PACK BASIN DAY SURGERY FS (CUSTOM PROCEDURE TRAY) ×3 IMPLANT
PAD CAST 3X4 CTTN HI CHSV (CAST SUPPLIES) IMPLANT
PADDING CAST ABS 4INX4YD NS (CAST SUPPLIES) ×2
PADDING CAST ABS COTTON 4X4 ST (CAST SUPPLIES) ×1 IMPLANT
PADDING CAST COTTON 3X4 STRL (CAST SUPPLIES)
SPLINT PLASTER CAST XFAST 3X15 (CAST SUPPLIES) IMPLANT
SPLINT PLASTER XTRA FASTSET 3X (CAST SUPPLIES)
STOCKINETTE 4X48 STRL (DRAPES) ×3 IMPLANT
SUT ETHILON 4 0 PS 2 18 (SUTURE) ×1 IMPLANT
SWAB COLLECTION DEVICE MRSA (MISCELLANEOUS) ×2 IMPLANT
SYR 20CC LL (SYRINGE) ×2 IMPLANT
SYR BULB 3OZ (MISCELLANEOUS) ×3 IMPLANT
SYR CONTROL 10ML LL (SYRINGE) IMPLANT
TOWEL OR 17X24 6PK STRL BLUE (TOWEL DISPOSABLE) ×6 IMPLANT
TUBE ANAEROBIC SPECIMEN COL (MISCELLANEOUS) ×2 IMPLANT
TUBE FEEDING 5FR 15 INCH (TUBING) IMPLANT
UNDERPAD 30X30 (UNDERPADS AND DIAPERS) ×3 IMPLANT

## 2014-11-27 NOTE — Anesthesia Preprocedure Evaluation (Signed)
Anesthesia Evaluation  Patient identified by MRN, date of birth, ID band Patient awake    Reviewed: Allergy & Precautions, NPO status , Patient's Chart, lab work & pertinent test results  Airway Mallampati: I  TM Distance: >3 FB Neck ROM: Full    Dental  (+) Teeth Intact, Dental Advisory Given   Pulmonary former smoker,    breath sounds clear to auscultation       Cardiovascular  Rhythm:Regular Rate:Normal     Neuro/Psych    GI/Hepatic GERD  Medicated and Controlled,  Endo/Other    Renal/GU      Musculoskeletal   Abdominal   Peds  Hematology   Anesthesia Other Findings   Reproductive/Obstetrics                             Anesthesia Physical Anesthesia Plan  ASA: III  Anesthesia Plan: General   Post-op Pain Management:    Induction: Intravenous  Airway Management Planned: LMA  Additional Equipment:   Intra-op Plan:   Post-operative Plan: Extubation in OR  Informed Consent: I have reviewed the patients History and Physical, chart, labs and discussed the procedure including the risks, benefits and alternatives for the proposed anesthesia with the patient or authorized representative who has indicated his/her understanding and acceptance.   Dental advisory given  Plan Discussed with: CRNA, Anesthesiologist and Surgeon  Anesthesia Plan Comments:         Anesthesia Quick Evaluation  

## 2014-11-27 NOTE — Brief Op Note (Signed)
11/27/2014  12:57 PM  PATIENT:  Lee Klein  64 y.o. male  PRE-OPERATIVE DIAGNOSIS:  INFECTION LEFT HAND  POST-OPERATIVE DIAGNOSIS:  * No post-op diagnosis entered *  PROCEDURE:  Procedure(s): INCISION AND DRAINAGE LEFT HAND (Left)  SURGEON:  Surgeon(s) and Role:    * Cindee Salt, MD - Primary  PHYSICIAN ASSISTANT:   ASSISTANTS: none   ANESTHESIA:   general  EBL:     BLOOD ADMINISTERED:none  DRAINS: none   LOCAL MEDICATIONS USED:  NONE  SPECIMEN:  Excision  DISPOSITION OF SPECIMEN:  PATHOLOGY  COUNTS:  YES  TOURNIQUET:   Total Tourniquet Time Documented: Upper Arm (Left) - 14 minutes Total: Upper Arm (Left) - 14 minutes   DICTATION: .Other Dictation: Dictation Number ???  PLAN OF CARE: Discharge to home after PACU  PATIENT DISPOSITION:  PACU - hemodynamically stable.

## 2014-11-27 NOTE — Transfer of Care (Signed)
Immediate Anesthesia Transfer of Care Note  Patient: ADRIN Klein  Procedure(s) Performed: Procedure(s): INCISION AND DRAINAGE LEFT HAND (Left)  Patient Location: PACU  Anesthesia Type:General  Level of Consciousness: sedated, patient cooperative and lethargic  Airway & Oxygen Therapy: Patient Spontanous Breathing and Patient connected to face mask oxygen  Post-op Assessment: Report given to RN and Post -op Vital signs reviewed and stable  Post vital signs: Reviewed and stable  Last Vitals:  Filed Vitals:   11/27/14 1210  BP: 172/88  Pulse: 70  Temp: 36.4 C  Resp: 18    Complications: No apparent anesthesia complications

## 2014-11-27 NOTE — Anesthesia Postprocedure Evaluation (Signed)
  Anesthesia Post-op Note  Patient: Lee Klein  Procedure(s) Performed: Procedure(s): INCISION AND DRAINAGE LEFT HAND (Left)  Patient Location: PACU  Anesthesia Type: General   Level of Consciousness: awake, alert  and oriented  Airway and Oxygen Therapy: Patient Spontanous Breathing  Post-op Pain: mild  Post-op Assessment: Post-op Vital signs reviewed  Post-op Vital Signs: Reviewed  Last Vitals:  Filed Vitals:   11/27/14 1432  BP: 182/76  Pulse: 75  Temp: 36.4 C  Resp: 16    Complications: No apparent anesthesia complications

## 2014-11-27 NOTE — Anesthesia Procedure Notes (Signed)
Procedure Name: LMA Insertion Date/Time: 11/27/2014 12:31 PM Performed by: Gar Gibbon Pre-anesthesia Checklist: Patient identified, Emergency Drugs available, Suction available and Patient being monitored Patient Re-evaluated:Patient Re-evaluated prior to inductionOxygen Delivery Method: Circle System Utilized Preoxygenation: Pre-oxygenation with 100% oxygen Intubation Type: IV induction Ventilation: Mask ventilation without difficulty LMA: LMA inserted LMA Size: 4.0 Number of attempts: 1 Airway Equipment and Method: Bite block Placement Confirmation: positive ETCO2 Tube secured with: Tape Dental Injury: Teeth and Oropharynx as per pre-operative assessment

## 2014-11-27 NOTE — Op Note (Signed)
dictated

## 2014-11-27 NOTE — H&P (View-Only) (Signed)
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  This includes osteomyelitis.  He has had multiple drainages performed.  He is seen in therapy with accumulation and abscess beginning to point on the dorsal abscess of his left wrist. This began over the weekend.  He is presently on Zithromax and Tygacil.  He is treated for this by infectious disease.  He has history of Crohn's disease necessitating  long term immunosuppressants.  He was seen after being referred by Dr. [name] with an 8 month history of swelling of his right wrist.  He underwent arthroscopy with partial synovectomy for a culture and diagnosis. This revealed the atypical mycobacteria.  Since then he has had multiple incision and drainages. He was recently admitted for acute pancreatitis.  ALLERGIES:   Demerol, IVP dye.  MEDICATIONS:  Protonix, multivitamins, antibiotics.  SURGICAL HISTORY:   He has had 7 colon operations, hernia repair and multiple I&D's of his left hand.  FAMILY MEDICAL HISTORY:  Positive for arthritis.  SOCIAL HISTORY:   He does not smoke or drink.  He is married and an Teacher, adult education.    REVIEW OF SYSTEMS:     Positive for glasses, Crohn's disease. ZISHA MONTJOY is an 64 y.o. male.   Chief Complaint: atypical mycobacterial infection left hand HPI: see above  Past Medical History  Diagnosis Date  . Crohn's disease   . Hernia, abdominal     "repaired 10/2013"  . GERD (gastroesophageal reflux disease)   . Anxiety   . 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 Kidney stones     "passed them"  . PICC (peripherally inserted central catheter) in place     right arm for home antibiotics  . Osteomyelitis of hand, left, acute   . Hearing loss   . Pancreatitis 10/2014    Past Surgical History  Procedure Laterality Date  . Ileostomy  06/2010; 10/2013  . Colonoscopy    . Upper gastrointestinal endoscopy    . Appendectomy  ~ 2009  . Ileostomy  closure N/A 10/23/2013    Procedure: ILEOSTOMY TAKEDOWN, REPAIR OF PARASTOMAL HERNIA.;  Surgeon: Cherylynn Ridges, MD;  Location: [facility];  Service: General;  Laterality: N/A;  . Wrist arthroscopy with debridement Left 05/24/2014    Procedure: LEFT ARTHROSCOPY WRIST CULTURE/BIOPSY DEBRIDEMENT;  Surgeon: Cindee Salt, MD;  Location: [facility];  Service: Orthopedics;  Laterality: Left;  . Wrist fracture surgery Right 06/2006    [name] 09/29/2010  . Esophagogastroduodenoscopy N/A 06/15/2014    Procedure: ESOPHAGOGASTRODUODENOSCOPY (EGD);  Surgeon: Malissa Hippo, MD;  [facility];  Service: Endoscopy;  Laterality: N/A;  . Cholecystectomy  ~ 2008  . Tonsillectomy  1963  . Fracture surgery    . Hernia repair  10/2013    parastomal/notes 10/03/2013  . Colon surgery  ~ 2010    for a colonic stricture at the anastomosis from a colostomy reversal/notes 10/03/2013  . Parastomal hernia repair  10/2013  . Incision and drainage abscess Left 06/22/2014    Procedure: INCISION, DEBRIDEMENT AND IRRIGATION LEFT HAND/WRIST;  Surgeon: Cindee Salt, MD;  Location: [facility];  Service: Orthopedics;  Laterality: Left;  . Incision and drainage of wound Left 07/27/2014    Procedure: IRRIGATION AND DEBRIDEMENT WOUND;  Surgeon: Cindee Salt, MD;  Location: [facility];  Service: Orthopedics;  Laterality: Left;  . Incision and drainage abscess Left 08/09/2014  Procedure: INCISION AND DRAINAGE LEFT WRIST;  Surgeon: Cindee Salt, MD;  Location: [facility];  Service: Orthopedics;  Laterality: Left;  . Incision and drainage abscess Left 09/05/2014    Procedure: INCISION AND DRAINAGE LEFT HAND;  Surgeon: Cindee Salt, MD;  Location: [facility];  Service: Orthopedics;  Laterality: Left;  . I&d extremity Left 10/02/2014    Procedure: IRRIGATION AND DEBRIDEMENT  LEFT HAND;  Surgeon: Cindee Salt, MD;  Location: [facility];  Service: Orthopedics;   Laterality: Left;  . I&d extremity Left 10/12/2014    Procedure: MINOR IRRIGATION AND DEBRIDEMENT LEFT HAND;  Surgeon: Cindee Salt, MD;  Location: [facility];  Service: Orthopedics;  Laterality: Left;    Family History  Problem Relation Age of Onset  . Healthy Mother   . Healthy Daughter   . Healthy Son    Social History:  reports that he quit smoking about 8 years ago. His smoking use included Cigars. He has never used smokeless tobacco. He reports that he drinks alcohol. He reports that he does not use illicit drugs.  Allergies:  Allergies  Allergen Reactions  . Amikacin     Caused pt to become deaf.  . Iodine Nausea Only    Other reaction(s): Other Joint pain  . Ivp Dye [Iodinated Diagnostic Agents]     Joint pain, nausea  . Fish Allergy Nausea And Vomiting  . Rifampin Nausea And Vomiting and Other (See Comments)    Chills  . Shellfish Allergy Swelling  . Demerol Rash    No prescriptions prior to admission    No results found for this or any previous visit (from the past 48 hour(s)).  No results found.   Pertinent items are noted in HPI.  There were no vitals taken for this visit.  General appearance: alert, cooperative and appears stated age Head: Normocephalic, without obvious abnormality Neck: no JVD Resp: clear to auscultation bilaterally Cardio: regular rate and rhythm, S1, S2 normal, no murmur, click, rub or gallop GI: soft, non-tender; bowel sounds normal; no masses,  no organomegaly Extremities: abcess left hand Pulses: 2+ and symmetric Skin: Skin color, texture, turgor normal. No rashes or lesions Neurologic: Grossly normal Incision/Wound: open  Assessment/Plan PLAN:   Incision and drainage left hand.    He is aware of risks and complications including injury to arteries, nerves, tendons, and stiffness.  He is well aware of osteomyelitis.  Sabine Tenenbaum R 11/12/2014, 10:14 AM

## 2014-11-27 NOTE — Interval H&P Note (Signed)
History and Physical Interval Note:  11/27/2014 11:30 AM  Lee Klein  has presented today for surgery, with the diagnosis of INFECTION LEFT HAND  The various methods of treatment have been discussed with the patient and family. After consideration of risks, benefits and other options for treatment, the patient has consented to  Procedure(s): INCISION AND DRAINAGE LEFT HAND (Left) as a surgical intervention .  The patient's history has been reviewed, patient examined, no change in status, stable for surgery.  I have reviewed the patient's chart and labs.  Questions were answered to the patient's satisfaction.     Gyneth Hubka R

## 2014-11-27 NOTE — Discharge Instructions (Addendum)

## 2014-11-28 ENCOUNTER — Encounter (HOSPITAL_BASED_OUTPATIENT_CLINIC_OR_DEPARTMENT_OTHER): Payer: Self-pay | Admitting: Orthopedic Surgery

## 2014-11-28 ENCOUNTER — Encounter: Payer: Self-pay | Admitting: Infectious Disease

## 2014-11-28 NOTE — Op Note (Signed)
Lee Klein, Lee Klein                ACCOUNT NO.:  [id number]  MEDICAL RECORD NO.:  [id number]  LOCATION:                               FACILITY:  [facility]  PHYSICIAN:  Cindee Salt, M.D.       DATE OF BIRTH:  12-20-1950  DATE OF PROCEDURE:  11/27/2014 DATE OF DISCHARGE:  11/27/2014                              OPERATIVE REPORT   PREOPERATIVE DIAGNOSIS:  Osteomyelitis with atypical mycobacteria, left hand.  POSTOPERATIVE DIAGNOSIS:  Osteomyelitis with atypical mycobacteria, left hand.  OPERATION:  Incision and drainage, left palm, left wrist.  SURGEON:  Cindee Salt, M.D.  ANESTHESIA:  General.  HISTORY:  The patient is a 64 year old male with a long history of problems with his left wrist secondary to atypical mycobacterial infections, which has been going on for nearly 1 year.  He has virtually all bones in his carpus, distal radius ulna, metacarpals infected.  He has undergone multiple incision and drainage.  He has developed a new abscess in his palm necessitating drainage along with debridement and repacking of remaining open areas.  DESCRIPTION OF PROCEDURE:  The patient was seen in the preoperative area.  The extremity marked by both patient and surgeon.  He was brought to the operating room, where general anesthetic was carried out without difficulty.  He was prepped using Betadine scrub and solution with the left arm free.  A 3-minute dry time was allowed.  Time-out taken confirming the patient and procedure.  The arm was elevated for exsanguination.  Tourniquet placed on the upper arm was inflated to 250 mmHg.  The incision was made on the palm.  An abscess cavity was immediately encountered.  This was opened further progressing between the metacarpals of the index and middle finger, this was debrided with both sharp dissection using scissors.  A culture was sent including some solid material, this was for aerobic, anaerobic, AFB, and fungal.  The area was copiously  irrigated with saline.  Second area was opened at the base of the thumb, this was more subcutaneous in nature.  This was irrigated.  The 2 open areas on the dorsum of the hand were each cleaned, copiously irrigated with saline, and all wounds were packed with Iodoform gauze.  A sterile compressive dressing, dorsal palmar was applied.  On deflation of the tourniquet, all fingers immediately pinked.  He was taken to the recovery room for observation in a satisfactory condition.  He will be discharged home to return to the [facility] [place] on Friday.    ______________________________ Cindee Salt, M.D.   ______________________________ Cindee Salt, M.D.    GK/MEDQ  D:  11/27/2014  T:  11/28/2014  Job:  [id number]

## 2014-11-30 LAB — WOUND CULTURE
CULTURE: NO GROWTH
CULTURE: NO GROWTH

## 2014-12-02 LAB — ANAEROBIC CULTURE

## 2014-12-03 ENCOUNTER — Encounter: Payer: Self-pay | Admitting: Infectious Disease

## 2014-12-03 ENCOUNTER — Ambulatory Visit (INDEPENDENT_AMBULATORY_CARE_PROVIDER_SITE_OTHER): Payer: Managed Care, Other (non HMO) | Admitting: Infectious Disease

## 2014-12-03 VITALS — BP 182/118 | HR 112 | Temp 98.0°F | Wt 159.8 lb

## 2014-12-03 DIAGNOSIS — K8500 Idiopathic acute pancreatitis without necrosis or infection: Secondary | ICD-10-CM

## 2014-12-03 DIAGNOSIS — M869 Osteomyelitis, unspecified: Secondary | ICD-10-CM

## 2014-12-03 DIAGNOSIS — A319 Mycobacterial infection, unspecified: Secondary | ICD-10-CM | POA: Diagnosis not present

## 2014-12-03 DIAGNOSIS — M009 Pyogenic arthritis, unspecified: Secondary | ICD-10-CM | POA: Diagnosis not present

## 2014-12-03 DIAGNOSIS — R630 Anorexia: Secondary | ICD-10-CM

## 2014-12-03 DIAGNOSIS — R600 Localized edema: Secondary | ICD-10-CM | POA: Insufficient documentation

## 2014-12-03 DIAGNOSIS — E2740 Unspecified adrenocortical insufficiency: Secondary | ICD-10-CM

## 2014-12-03 DIAGNOSIS — K50919 Crohn's disease, unspecified, with unspecified complications: Secondary | ICD-10-CM

## 2014-12-03 NOTE — Progress Notes (Signed)
Subjective:    Patient ID: Lee Klein, male    DOB: 14-Apr-1951, 64 y.o.   MRN: [id number]  HPI   64 y.o. 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 Fredna Klein in January of this year.  He was admitted to the hospital and we started patient on IV cefoxitin high dose, BID clarithro and BID zyvox.  He had incision and drainage of wrist with thorough debridementincluding distal radioulnar joint, incision and drainage of palm withdrainage of 2 abscesses, one on the index, one on the ring finger on February 5th, 2016.  Since he left the hospital he is having a VERY hard time tolerating his abx with metallic taste in mouth, poor appetite nausea and feeling of burning down his throat.  He had sensitivities sent off to [facility] as well as [facility] and they're listed below.       [facility]:       We switched him over to intravenous tigecycline along with 3 times weekly IV amikacin and continued azithromycin.  His left hand swelled up further and he required further incision and debridement by Dr. Fredna Klein. He found what appear to be infected bone consistent with MRI findings but did not remove actual bone but scraped unhealthy material from around this and sent this for bacterial cultures which were negative.  In the interim the patient also had continued to feel poorly and his gastroneurologist and suggested that he might be suffering from adrenal insufficiency. Apparently the patient been on prednisone for 39 years and had been down on 5 mg of prednisone when seen in the hospital but was taken off of it "cold [place]". He was placed back on prednisone at 10 mg and now 5 mg a day and is improved much and this with much less nausea malaise and fatigue. His appetite also has returned. This despite the fact he is also getting tigecycline and azithromycin.   He  underwent another I and D on 08/09/14 and SO there was no  m abscessus growth.  Since my last visit in April he developed sudden hearing loss and is now functionally deaf and not able to hear anything I spoke to him in clinic. It appears he may have received amikacin twice when this was first noticed because our group in ID were not aware of this until he had 3 days of hearing loss and they had already gone to PCP and audiologist--at least that is what I am hearing today.  We restarted him on IV cefoxitin along with tygacil and azithromycin but he very quickly developed abdominal pain, malaise, nausea. After a few days of receiving infusion he refused it saying "I think I am going to die". Cefoxitin was stopped but GI discomfort persisted and he was admitted with pancreatitis thought possibly to be due to cefoxitin. Initially his regimen was held but then re-introduced along with clofazimine which we acquired from [facility] and he has been able to tolerate this without trouble. His transaminases and alk phosph have been up a bit but now stable.   He had area that swelled up and burst on volar part of hand that required YET another I and D (13th surgery) and fresh cultures taken. Wife and pt tell me that Dr Lee Klein was very pleased however with appearance today.   Review of Systems  Constitutional: Negative for fever, chills and diaphoresis.  HENT: Negative for  congestion, rhinorrhea, sinus pressure, sneezing and sore throat.   Eyes: Negative for photophobia and visual disturbance.  Respiratory: Negative for cough, chest tightness, shortness of breath, wheezing and stridor.   Cardiovascular: Negative for chest pain, palpitations and leg swelling.  Gastrointestinal: Negative for vomiting, abdominal pain, diarrhea, constipation, blood in stool, abdominal distention and anal bleeding.  Genitourinary: Negative for dysuria, hematuria, flank pain and difficulty urinating.  Musculoskeletal: Negative for myalgias,  back pain, joint swelling, arthralgias and gait problem.  Skin: Negative for color change, pallor, rash and wound.  Neurological: Negative for dizziness, weakness and light-headedness.  Hematological: Negative for adenopathy. Does not bruise/bleed easily.  Psychiatric/Behavioral: Negative for behavioral problems, confusion, sleep disturbance, decreased concentration and agitation.       Objective:   Physical Exam  Constitutional: He is oriented to person, place, and time. He appears well-nourished.  HENT:  Head: Normocephalic and atraumatic.  Mouth/Throat: Oropharynx is clear and moist. No oropharyngeal exudate.  Eyes: Conjunctivae and EOM are normal.  Neck: Normal range of motion. Neck supple.  Cardiovascular: Normal rate and regular rhythm.   Pulmonary/Chest: Effort normal. No respiratory distress. He has no wheezes.  Abdominal: Soft. He exhibits no distension.  Musculoskeletal: Normal range of motion. He exhibits no edema or tenderness.       Arms: Neurological: He is alert and oriented to person, place, and time.  Skin: Skin is warm and dry. No rash noted. No erythema. No pallor.  Psychiatric: He has a normal mood and affect.          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 HOpefully he can recover SOME function. He needs hearing aids but apparently audiology not willing to put inpermanent implants until "infection is resolved" something I dont think is necessary as the infection is localized to his hand and not involving his ear  Pancreatitis: due to cefoxitin? Due to choledocholithiasis  Adrenal insufficiency: Apparent adrenal insufficiency based on conical history on steroids  and knee feels much better I would like to have him tapered off carefully   Crohn's disease: quiescent  Lower extremity edema on left: sp rule out for DVT. Could be due to low albumin but not sure why marked assymetry  Disability: I filled out disability forms work related forms for the patient.  I am frankly VERY skeptical that the patient will EVER return to work though it is possible it is NOT going to happen any time soon.  First of all HE WILL HAVE TO BE ABLE TO CURE HIS INFECTION WITH REPEATED SURGERIES FOLLOWED BY A MINIMUM OF ONE YEAR OF ANTI-M ABSCESSUS ANTIOBIOTICS--if he can tolerate this  He COULD potentially end up requiring an amputation of his hand on the left side  He will need cochlear implants for hearing  Loss from amikacin but his audiologists are deferring that until his M abscessus has resolved  He is SEVERELY deconditioned and still suffering from low grade liver function abnormalities  He will require extensive rehab to be able to work again  I spent greater than 40   minutes with the patient including greater than 50% of time in face to face counsel of the patient re his M abscessus, AI, hearing loss, Crohns disease, pancreatitis and in coordination  of their care.

## 2014-12-04 ENCOUNTER — Other Ambulatory Visit (INDEPENDENT_AMBULATORY_CARE_PROVIDER_SITE_OTHER): Payer: Self-pay | Admitting: Internal Medicine

## 2014-12-07 ENCOUNTER — Other Ambulatory Visit (INDEPENDENT_AMBULATORY_CARE_PROVIDER_SITE_OTHER): Payer: Self-pay | Admitting: Internal Medicine

## 2014-12-07 MED ORDER — PROMETHAZINE HCL 25 MG PO TABS: 25.0000 mg | ORAL_TABLET | ORAL | Status: AC | PRN

## 2014-12-07 NOTE — Telephone Encounter (Signed)
Rx for Phenergan # 60 with 1 refill

## 2014-12-25 LAB — AFB CULTURE WITH SMEAR (NOT AT ARMC): ACID FAST SMEAR: NONE SEEN

## 2014-12-31 ENCOUNTER — Encounter (HOSPITAL_BASED_OUTPATIENT_CLINIC_OR_DEPARTMENT_OTHER): Payer: Self-pay | Admitting: *Deleted

## 2014-12-31 ENCOUNTER — Telehealth: Payer: Self-pay | Admitting: *Deleted

## 2014-12-31 ENCOUNTER — Other Ambulatory Visit: Payer: Self-pay | Admitting: Orthopedic Surgery

## 2014-12-31 NOTE — Telephone Encounter (Signed)
Received labs from [facility]. Alkaline Phosphatase = 239, AST = 71, ALT = 109.  Previously within normal limit.

## 2014-12-31 NOTE — Telephone Encounter (Signed)
They have been abnormal with [facility]. I asked them to repeat them as an outpatient  I think he should be seen by GI and have a HIDA scan as well

## 2015-01-01 ENCOUNTER — Telehealth (INDEPENDENT_AMBULATORY_CARE_PROVIDER_SITE_OTHER): Payer: Self-pay | Admitting: *Deleted

## 2015-01-01 ENCOUNTER — Encounter (HOSPITAL_BASED_OUTPATIENT_CLINIC_OR_DEPARTMENT_OTHER)
Admission: RE | Disposition: A | Discharge status: Home / Self Care | Payer: Self-pay | Source: Ambulatory Visit | Attending: Orthopedic Surgery

## 2015-01-01 ENCOUNTER — Ambulatory Visit (HOSPITAL_BASED_OUTPATIENT_CLINIC_OR_DEPARTMENT_OTHER)
Admission: RE | Admit: 2015-01-01 | Discharge: 2015-01-01 | Disposition: A | Discharge status: Home / Self Care | Payer: Managed Care, Other (non HMO) | Source: Ambulatory Visit | Attending: Orthopedic Surgery | Admitting: Orthopedic Surgery

## 2015-01-01 ENCOUNTER — Encounter (HOSPITAL_BASED_OUTPATIENT_CLINIC_OR_DEPARTMENT_OTHER): Payer: Self-pay | Admitting: *Deleted

## 2015-01-01 ENCOUNTER — Ambulatory Visit (HOSPITAL_BASED_OUTPATIENT_CLINIC_OR_DEPARTMENT_OTHER): Payer: Managed Care, Other (non HMO) | Admitting: Certified Registered"

## 2015-01-01 ENCOUNTER — Encounter: Payer: Self-pay | Admitting: Infectious Disease

## 2015-01-01 DIAGNOSIS — K50918 Crohn's disease, unspecified, with other complication: Secondary | ICD-10-CM

## 2015-01-01 DIAGNOSIS — Z87891 Personal history of nicotine dependence: Secondary | ICD-10-CM | POA: Diagnosis not present

## 2015-01-01 DIAGNOSIS — R7989 Other specified abnormal findings of blood chemistry: Secondary | ICD-10-CM

## 2015-01-01 DIAGNOSIS — M86142 Other acute osteomyelitis, left hand: Secondary | ICD-10-CM | POA: Insufficient documentation

## 2015-01-01 DIAGNOSIS — Z79899 Other long term (current) drug therapy: Secondary | ICD-10-CM | POA: Diagnosis not present

## 2015-01-01 DIAGNOSIS — K219 Gastro-esophageal reflux disease without esophagitis: Secondary | ICD-10-CM | POA: Insufficient documentation

## 2015-01-01 DIAGNOSIS — F419 Anxiety disorder, unspecified: Secondary | ICD-10-CM | POA: Insufficient documentation

## 2015-01-01 DIAGNOSIS — A319 Mycobacterial infection, unspecified: Secondary | ICD-10-CM | POA: Diagnosis present

## 2015-01-01 DIAGNOSIS — K5090 Crohn's disease, unspecified, without complications: Secondary | ICD-10-CM | POA: Diagnosis not present

## 2015-01-01 DIAGNOSIS — R945 Abnormal results of liver function studies: Secondary | ICD-10-CM

## 2015-01-01 DIAGNOSIS — Z792 Long term (current) use of antibiotics: Secondary | ICD-10-CM | POA: Diagnosis not present

## 2015-01-01 DIAGNOSIS — Z7952 Long term (current) use of systemic steroids: Secondary | ICD-10-CM | POA: Insufficient documentation

## 2015-01-01 HISTORY — DX: Mycobacterial infection, unspecified: A31.9

## 2015-01-01 HISTORY — DX: Personal history of other diseases of the digestive system: Z87.19

## 2015-01-01 HISTORY — PX: I&D EXTREMITY: SHX5045

## 2015-01-01 HISTORY — DX: Pyogenic arthritis, unspecified: M00.9

## 2015-01-01 HISTORY — DX: Changes in skin texture: R23.4

## 2015-01-01 HISTORY — DX: Personal history of urinary calculi: Z87.442

## 2015-01-01 LAB — GLUCOSE, CAPILLARY
GLUCOSE-CAPILLARY: 62 mg/dL — AB (ref 65–99)
GLUCOSE-CAPILLARY: 48 mg/dL — AB (ref 65–99)
GLUCOSE-CAPILLARY: 76 mg/dL (ref 65–99)
Glucose-Capillary: 58 mg/dL — ABNORMAL LOW (ref 65–99)
Glucose-Capillary: 90 mg/dL (ref 65–99)

## 2015-01-01 SURGERY — IRRIGATION AND DEBRIDEMENT EXTREMITY
Anesthesia: General | Site: Hand | Laterality: Left

## 2015-01-01 MED ORDER — FENTANYL CITRATE (PF) 100 MCG/2ML IJ SOLN
50.0000 ug | INTRAMUSCULAR | Status: DC | PRN
  Administered 2015-01-01: 100 ug via INTRAVENOUS

## 2015-01-01 MED ORDER — MIDAZOLAM HCL 2 MG/2ML IJ SOLN
INTRAMUSCULAR | Status: AC
  Filled 2015-01-01: qty 2

## 2015-01-01 MED ORDER — HYDROMORPHONE HCL 1 MG/ML IJ SOLN
0.2500 mg | INTRAMUSCULAR | Status: DC | PRN
  Administered 2015-01-01: 0.5 mg via INTRAVENOUS

## 2015-01-01 MED ORDER — HYDROMORPHONE HCL 1 MG/ML IJ SOLN
INTRAMUSCULAR | Status: AC
  Filled 2015-01-01: qty 1

## 2015-01-01 MED ORDER — PROPOFOL 10 MG/ML IV BOLUS
INTRAVENOUS | Status: DC | PRN
  Administered 2015-01-01: 200 mg via INTRAVENOUS

## 2015-01-01 MED ORDER — HYDROCODONE-ACETAMINOPHEN 10-325 MG PO TABS: 1.0000 | ORAL_TABLET | Freq: Four times a day (QID) | ORAL | Status: DC | PRN

## 2015-01-01 MED ORDER — GLYCOPYRROLATE 0.2 MG/ML IJ SOLN: 0.2000 mg | Freq: Once | INTRAMUSCULAR | Status: DC | PRN

## 2015-01-01 MED ORDER — DEXTROSE 50 % IV SOLN
50.0000 mL | Freq: Once | INTRAVENOUS | Status: AC
  Administered 2015-01-01: 50 mL via INTRAVENOUS

## 2015-01-01 MED ORDER — METOPROLOL TARTRATE 1 MG/ML IV SOLN
5.0000 mg | Freq: Once | INTRAVENOUS | Status: AC
  Administered 2015-01-01: 2.5 mg via INTRAVENOUS

## 2015-01-01 MED ORDER — CHLORHEXIDINE GLUCONATE 4 % EX LIQD: 60.0000 mL | Freq: Once | CUTANEOUS | Status: DC

## 2015-01-01 MED ORDER — VANCOMYCIN HCL IN DEXTROSE 1-5 GM/200ML-% IV SOLN
1000.0000 mg | INTRAVENOUS | Status: AC
  Administered 2015-01-01: 1000 mg via INTRAVENOUS

## 2015-01-01 MED ORDER — LACTATED RINGERS IV SOLN
INTRAVENOUS | Status: DC
  Administered 2015-01-01: 14:00:00 via INTRAVENOUS

## 2015-01-01 MED ORDER — FENTANYL CITRATE (PF) 100 MCG/2ML IJ SOLN
INTRAMUSCULAR | Status: AC
  Filled 2015-01-01: qty 4

## 2015-01-01 MED ORDER — BUPIVACAINE HCL (PF) 0.25 % IJ SOLN
INTRAMUSCULAR | Status: DC | PRN
  Administered 2015-01-01: 3 mL

## 2015-01-01 MED ORDER — VANCOMYCIN HCL 1000 MG IV SOLR
INTRAVENOUS | Status: AC
  Filled 2015-01-01: qty 1000

## 2015-01-01 MED ORDER — ONDANSETRON HCL 4 MG/2ML IJ SOLN
INTRAMUSCULAR | Status: DC | PRN
  Administered 2015-01-01: 4 mg via INTRAVENOUS

## 2015-01-01 MED ORDER — MIDAZOLAM HCL 2 MG/2ML IJ SOLN
1.0000 mg | INTRAMUSCULAR | Status: DC | PRN
  Administered 2015-01-01: 2 mg via INTRAVENOUS

## 2015-01-01 MED ORDER — SCOPOLAMINE 1 MG/3DAYS TD PT72: 1.0000 | MEDICATED_PATCH | Freq: Once | TRANSDERMAL | Status: DC | PRN

## 2015-01-01 SURGICAL SUPPLY — 50 items
BAG DECANTER FOR FLEXI CONT (MISCELLANEOUS) IMPLANT
BLADE MINI RND TIP GREEN BEAV (BLADE) IMPLANT
BLADE SURG 15 STRL LF DISP TIS (BLADE) ×1 IMPLANT
BLADE SURG 15 STRL SS (BLADE) ×3
BNDG CMPR 9X4 STRL LF SNTH (GAUZE/BANDAGES/DRESSINGS) ×1
BNDG COHESIVE 1X5 TAN STRL LF (GAUZE/BANDAGES/DRESSINGS) IMPLANT
BNDG COHESIVE 3X5 TAN STRL LF (GAUZE/BANDAGES/DRESSINGS) ×3 IMPLANT
BNDG ESMARK 4X9 LF (GAUZE/BANDAGES/DRESSINGS) ×3 IMPLANT
BNDG GAUZE ELAST 4 BULKY (GAUZE/BANDAGES/DRESSINGS) ×3 IMPLANT
BRUSH SCRUB EZ PLAIN DRY (MISCELLANEOUS) ×2 IMPLANT
CHLORAPREP W/TINT 26ML (MISCELLANEOUS) ×1 IMPLANT
CORDS BIPOLAR (ELECTRODE) ×1 IMPLANT
COVER BACK TABLE 60X90IN (DRAPES) ×3 IMPLANT
COVER MAYO STAND STRL (DRAPES) ×3 IMPLANT
CUFF TOURNIQUET SINGLE 18IN (TOURNIQUET CUFF) ×3 IMPLANT
DRAPE EXTREMITY T 121X128X90 (DRAPE) ×3 IMPLANT
DRAPE SURG 17X23 STRL (DRAPES) ×3 IMPLANT
DRSG KUZMA FLUFF (GAUZE/BANDAGES/DRESSINGS) ×1 IMPLANT
GAUZE PACKING IODOFORM 1/4X15 (GAUZE/BANDAGES/DRESSINGS) ×2 IMPLANT
GAUZE SPONGE 4X4 12PLY STRL (GAUZE/BANDAGES/DRESSINGS) ×3 IMPLANT
GAUZE XEROFORM 1X8 LF (GAUZE/BANDAGES/DRESSINGS) ×1 IMPLANT
GLOVE BIOGEL PI IND STRL 7.0 (GLOVE) ×1 IMPLANT
GLOVE BIOGEL PI IND STRL 8.5 (GLOVE) ×1 IMPLANT
GLOVE BIOGEL PI INDICATOR 7.0 (GLOVE) ×2
GLOVE BIOGEL PI INDICATOR 8.5 (GLOVE) ×2
GLOVE ECLIPSE 6.5 STRL STRAW (GLOVE) ×2 IMPLANT
GLOVE SURG ORTHO 8.0 STRL STRW (GLOVE) ×3 IMPLANT
GOWN STRL REUS W/ TWL LRG LVL3 (GOWN DISPOSABLE) ×1 IMPLANT
GOWN STRL REUS W/TWL LRG LVL3 (GOWN DISPOSABLE) ×3
GOWN STRL REUS W/TWL XL LVL3 (GOWN DISPOSABLE) ×3 IMPLANT
LOOP VESSEL MAXI BLUE (MISCELLANEOUS) IMPLANT
NEEDLE PRECISIONGLIDE 27X1.5 (NEEDLE) ×3 IMPLANT
NS IRRIG 1000ML POUR BTL (IV SOLUTION) ×3 IMPLANT
PACK BASIN DAY SURGERY FS (CUSTOM PROCEDURE TRAY) ×3 IMPLANT
PAD CAST 3X4 CTTN HI CHSV (CAST SUPPLIES) IMPLANT
PADDING CAST ABS 4INX4YD NS (CAST SUPPLIES) ×2
PADDING CAST ABS COTTON 4X4 ST (CAST SUPPLIES) ×1 IMPLANT
PADDING CAST COTTON 3X4 STRL (CAST SUPPLIES) ×3
SPLINT PLASTER CAST XFAST 3X15 (CAST SUPPLIES) IMPLANT
SPLINT PLASTER XTRA FASTSET 3X (CAST SUPPLIES) ×10
STOCKINETTE 4X48 STRL (DRAPES) ×3 IMPLANT
SUT ETHILON 4 0 PS 2 18 (SUTURE) ×1 IMPLANT
SWAB COLLECTION DEVICE MRSA (MISCELLANEOUS) IMPLANT
SYR BULB 3OZ (MISCELLANEOUS) ×3 IMPLANT
SYR CONTROL 10ML LL (SYRINGE) ×3 IMPLANT
TOWEL OR 17X24 6PK STRL BLUE (TOWEL DISPOSABLE) ×4 IMPLANT
TRAY DSU PREP LF (CUSTOM PROCEDURE TRAY) ×2 IMPLANT
TUBE ANAEROBIC SPECIMEN COL (MISCELLANEOUS) IMPLANT
TUBE FEEDING 5FR 15 INCH (TUBING) ×3 IMPLANT
UNDERPAD 30X30 (UNDERPADS AND DIAPERS) ×3 IMPLANT

## 2015-01-01 NOTE — Anesthesia Preprocedure Evaluation (Signed)
Anesthesia Evaluation  Patient identified by MRN, date of birth, ID band Patient awake    Reviewed: Allergy & Precautions, NPO status , Patient's Chart, lab work & pertinent test results  Airway Mallampati: II  TM Distance: >3 FB Neck ROM: Full    Dental   Pulmonary former smoker,  breath sounds clear to auscultation        Cardiovascular negative cardio ROS  Rhythm:Regular Rate:Normal     Neuro/Psych    GI/Hepatic Neg liver ROS, GERD-  ,  Endo/Other  negative endocrine ROS  Renal/GU Renal disease     Musculoskeletal   Abdominal   Peds  Hematology   Anesthesia Other Findings   Reproductive/Obstetrics                             Anesthesia Physical Anesthesia Plan  ASA: III  Anesthesia Plan: General   Post-op Pain Management:    Induction: Intravenous  Airway Management Planned: LMA  Additional Equipment:   Intra-op Plan:   Post-operative Plan: Extubation in OR  Informed Consent: I have reviewed the patients History and Physical, chart, labs and discussed the procedure including the risks, benefits and alternatives for the proposed anesthesia with the patient or authorized representative who has indicated his/her understanding and acceptance.   Dental advisory given  Plan Discussed with: CRNA and Anesthesiologist  Anesthesia Plan Comments:         Anesthesia Quick Evaluation

## 2015-01-01 NOTE — Telephone Encounter (Signed)
Labs have been ordered. Forwarded to [facility] for the upper abdominal ultrasound. Patient will also need to have a office visit.

## 2015-01-01 NOTE — Telephone Encounter (Signed)
Thanks Lee Klein, he cant afford to stop his azithromycin and clofazamine. We could consider hodling tygacil as we did before but I am trying to push 3 active drugs on him and give him a chance at cure after everything he has been through  Hopefully labs look better today too

## 2015-01-01 NOTE — Op Note (Signed)
Dictation Number [id number]

## 2015-01-01 NOTE — Transfer of Care (Signed)
Immediate Anesthesia Transfer of Care Note  Patient: Lee Klein  Procedure(s) Performed: Procedure(s): IRRIGATION AND DEBRIDEMENT EXTREMITY (Left)  Patient Location: PACU  Anesthesia Type:General  Level of Consciousness: awake and patient cooperative  Airway & Oxygen Therapy: Patient Spontanous Breathing and Patient connected to face mask oxygen  Post-op Assessment: Report given to RN and Post -op Vital signs reviewed and stable  Post vital signs: Reviewed and stable  Last Vitals:  Filed Vitals:   01/01/15 1238  BP: 179/80  Pulse: 88  Temp: 36.6 C  Resp: 20    Complications: No apparent anesthesia complications

## 2015-01-01 NOTE — Telephone Encounter (Signed)
Left a message at Dr. Patty Sermons [facility] for [facility]) office asking if he needed a new referral, and if so how they would prefer that referral. Thanks!

## 2015-01-01 NOTE — Telephone Encounter (Signed)
Reviewed with Dr.Rehman - Forwarded to Dr.Rehman for recommendation.

## 2015-01-01 NOTE — Brief Op Note (Signed)
01/01/2015  2:30 PM  PATIENT:  Lee Klein  65 y.o. male  PRE-OPERATIVE DIAGNOSIS:  INFECTION LEFT HAND AND WRIST  POST-OPERATIVE DIAGNOSIS:  INFECTION LEFT HAND AND WRIST  PROCEDURE:  Procedure(s): IRRIGATION AND DEBRIDEMENT EXTREMITY (Left)  SURGEON:  Surgeon(s) and Role:    * Cindee Salt, MD - Primary  PHYSICIAN ASSISTANT:   ASSISTANTS: none   ANESTHESIA:   local and general  EBL:     BLOOD ADMINISTERED:none  DRAINS: none   LOCAL MEDICATIONS USED:  BUPIVICAINE   SPECIMEN:  Source of Specimen:  culture  DISPOSITION OF SPECIMEN:  PATHOLOGY  COUNTS:  YES  TOURNIQUET:   Total Tourniquet Time Documented: Upper Arm (Left) - 20 minutes Total: Upper Arm (Left) - 20 minutes   DICTATION: .Other Dictation: Dictation Number [id number]  PLAN OF CARE: Admit to inpatient   PATIENT DISPOSITION:  PACU - hemodynamically stable.

## 2015-01-01 NOTE — H&P (Signed)
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  He has had multiple drainages performed.  He is seen in therapy with accumulation and abscess beginning to point on the dorsal abscess of his left wrist. This began over the weekend.  He is presently on Zithromax and Tygacil.  He is treated for this by infectious disease.  He has history of Crohn's disease necessitating  long term immunosuppressants.  He was seen after being referred by Dr. [name] with an 8 month history of swelling of his right wrist.  He underwent arthroscopy with partial synovectomy for a culture and diagnosis. This revealed the atypical mycobacteria.  Since then he has had multiple incision and drainages. He has developed swelling again over the dorsum of the hand and ulnar wist. He has oteo and an abcess in thedistal ulna on x-ray.  ALLERGIES:   Demerol, IVP dye.  MEDICATIONS:  Protonix, multivitamins, antibiotics.  SURGICAL HISTORY:   He has had 7 colon operations, hernia repair and multiple I&D's of his left hand.  FAMILY MEDICAL HISTORY:  Positive for arthritis.  SOCIAL HISTORY:   He does not smoke or drink.  He is married and an Teacher, adult education.    REVIEW OF SYSTEMS:     Positive for glasses, Crohn's disease. Lee Klein is an 64 y.o. male.   Chief Complaint: atypical mycobacteria left hand HPI: see above  Past Medical History  Diagnosis Date  . Crohn's disease   . GERD (gastroesophageal reflux disease)   . Anxiety   . 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 PICC (peripherally inserted central catheter) in place     right arm  . Osteomyelitis of hand, left, acute   . Hearing loss   . Pancreatitis 10/2014  . History of kidney stones   . History of pancreatitis 11/06/2014  . Mycobacterial infection     left hand/wrist  . Thin skin   . Septic arthritis of wrist, left     Past Surgical History   Procedure Laterality Date  . Colonoscopy    . Appendectomy  ~ 2009  . Ileostomy closure N/A 10/23/2013    Procedure: ILEOSTOMY TAKEDOWN, REPAIR OF PARASTOMAL HERNIA.;  Surgeon: Cherylynn Ridges, MD;  Location: [facility];  Service: General;  Laterality: N/A;  . Wrist arthroscopy with debridement Left 05/24/2014    Procedure: LEFT ARTHROSCOPY WRIST CULTURE/BIOPSY DEBRIDEMENT;  Surgeon: Cindee Salt, MD;  Location: [facility];  Service: Orthopedics;  Laterality: Left;  . Esophagogastroduodenoscopy N/A 06/15/2014    Procedure: ESOPHAGOGASTRODUODENOSCOPY (EGD);  Surgeon: Malissa Hippo, MD;  Location: [facility];  Service: Endoscopy;  Laterality: N/A;  . Cholecystectomy  ~ 2008  . Parastomal hernia repair  10/2013  . Incision and drainage abscess Left 06/22/2014    Procedure: INCISION, DEBRIDEMENT AND IRRIGATION LEFT HAND/WRIST;  Surgeon: Cindee Salt, MD;  Location: [facility];  Service: Orthopedics;  Laterality: Left;  . Incision and drainage of wound Left 07/27/2014    Procedure: IRRIGATION AND DEBRIDEMENT WOUND;  Surgeon: Cindee Salt, MD;  Location: [facility];  Service: Orthopedics;  Laterality: Left;  . Incision and drainage abscess Left 08/09/2014    Procedure: INCISION AND DRAINAGE LEFT WRIST;  Surgeon: Cindee Salt, MD;  Location:  SURGERY CENTER;  Service: Orthopedics;  Laterality: Left;  . Incision and drainage abscess Left 09/05/2014    Procedure: INCISION AND DRAINAGE  LEFT HAND;  Surgeon: Cindee Salt, MD;  Location: [facility];  Service: Orthopedics;  Laterality: Left;  . I&d extremity Left 10/02/2014    Procedure: IRRIGATION AND DEBRIDEMENT  LEFT HAND;  Surgeon: Cindee Salt, MD;  Location: [facility];  Service: Orthopedics;  Laterality: Left;  . I&d extremity Left 10/12/2014    Procedure: MINOR IRRIGATION AND DEBRIDEMENT LEFT HAND;  Surgeon: Cindee Salt, MD;  Location: [facility];  Service: Orthopedics;   Laterality: Left;  . Incision and drainage abscess Left 11/12/2014    Procedure: INCISION AND DRAINAGE ABSCESS LEFT HAND;  Surgeon: Cindee Salt, MD;  Location: [facility];  Service: Orthopedics;  Laterality: Left;  . Incision and drainage Left 11/27/2014    Procedure: INCISION AND DRAINAGE LEFT HAND;  Surgeon: Cindee Salt, MD;  Location: [facility];  Service: Orthopedics;  Laterality: Left;  . Tonsillectomy  1963  . Colon surgery  ~ 2010    for a colonic stricture at the anastomosis from a colostomy reversal/notes 10/03/2013  . Parastomal hernia repair  11/02/2013  . Orif distal radius fracture Right 06/29/2006  . Ileostomy    . Ileoscopy  07/02/2010    History reviewed. No pertinent family history. Social History:  reports that he quit smoking about 8 years ago. He has never used smokeless tobacco. He reports that he drinks alcohol. He reports that he does not use illicit drugs.  Allergies:  Allergies  Allergen Reactions  . Amikacin Other (See Comments)    CAUSED DEAFNESS  . Cefoxitin Nausea Only    ABD. PAIN  . Fish Allergy Nausea And Vomiting  . Iodine Nausea Only    JOINT PAIN  . Ivp Dye [Iodinated Diagnostic Agents] Nausea Only    JOINT PAIN  . Rifampin Nausea And Vomiting and Other (See Comments)    CHILLS  . Shellfish Allergy Swelling  . Demerol Rash    Medications Prior to Admission  Medication Sig Dispense Refill  . ALPRAZolam (XANAX) 0.5 MG tablet TAKE 1 TABLET BY MOUTH TWICE A DAY AS NEEDED FOR ANXIETY 60 tablet 2  . Ascorbic Acid (VITAMIN C) 1000 MG tablet Take 1,000 mg by mouth daily.    Marland Kitchen azithromycin (ZITHROMAX) 600 MG tablet Take 1 tablet (600 mg total) by mouth daily. 7 tablet 0  . Calcium Carbonate-Vitamin D (CALCIUM 600 + D PO) Take 1 tablet by mouth daily.     . furosemide (LASIX) 20 MG tablet Take 20 mg by mouth.    Marland Kitchen HYDROcodone-acetaminophen (NORCO) 10-325 MG per tablet Take 1 tablet by mouth every 6 (six) hours as needed.  (Patient taking differently: Take 1 tablet by mouth every 6 (six) hours as needed for moderate pain. ) 30 tablet 0  . loperamide (IMODIUM A-D) 2 MG tablet Take 8 mg by mouth 2 (two) times daily.     . Multiple Vitamins-Minerals (MULTIVITAMIN PO) Take 1 tablet by mouth daily.     Marland Kitchen nystatin (MYCOSTATIN/NYSTOP) 100000 UNIT/GM POWD Apply topically daily as needed (FOR IRRITATION). PRN  1  . pantoprazole (PROTONIX) 40 MG tablet Take 1 tablet (40 mg total) by mouth 2 (two) times daily before a meal. 180 tablet 4  . predniSONE (DELTASONE) 5 MG tablet Take 5 mg by mouth daily with breakfast.    . promethazine (PHENERGAN) 25 MG tablet Take 1 tablet (25 mg total) by mouth as needed for nausea or vomiting. 60 tablet 2  . tigecycline 50 mg in sodium chloride  0.9 % 100 mL Inject 50 mg into the vein every 12 (twelve) hours. 1 vial 0  . Zinc 50 MG CAPS Take 1 capsule by mouth daily.       No results found for this or any previous visit (from the past 48 hour(s)).  No results found.   Pertinent items are noted in HPI.  Height 5\' 10"  (1.778 m), weight 72.122 kg (159 lb).  General appearance: alert, cooperative and appears stated age Head: Normocephalic, without obvious abnormality Neck: no JVD Resp: clear to auscultation bilaterally Cardio: regular rate and rhythm, S1, S2 normal, no murmur, click, rub or gallop GI: soft, non-tender; bowel sounds normal; no masses,  no organomegaly Extremities: swelling left hand Pulses: 2+ and symmetric Skin: Skin color, texture, turgor normal. No rashes or lesions Neurologic: Grossly normal Incision/Wound: Open dorsal  Assessment/Plan PLAN:   Incision and drainage left hand.    He is aware of risks and complications including injury to arteries, nerves, tendons, and stiffness.  He is well aware of osteomyelitis.  Leen Tworek R 01/01/2015, 12:48 PM

## 2015-01-01 NOTE — Anesthesia Postprocedure Evaluation (Signed)
  Anesthesia Post-op Note  Patient: Lee Klein  Procedure(s) Performed: Procedure(s): IRRIGATION AND DEBRIDEMENT LEFT HAND/WRIST (Left)  Patient Location: PACU  Anesthesia Type:General  Level of Consciousness: awake  Airway and Oxygen Therapy: Patient Spontanous Breathing  Post-op Pain: mild  Post-op Assessment: Post-op Vital signs reviewed              Post-op Vital Signs: Reviewed  Last Vitals:  Filed Vitals:   01/01/15 1629  BP:   Pulse:   Temp: 38.9 C  Resp:     Complications: No apparent anesthesia complications

## 2015-01-01 NOTE — Progress Notes (Signed)
IV team notified, will come see pt and access picc line.

## 2015-01-01 NOTE — Telephone Encounter (Signed)
Dr. Algis Liming wants you to see (appt already scheduled on 01/15/2015 with you) again.  His LFT's are rising and they are wondering if he needs Hida Scan per [facility].  Do we need to move up or can we do studies prior?  Please advise

## 2015-01-01 NOTE — Anesthesia Procedure Notes (Signed)
Procedure Name: LMA Insertion Performed by: Barnet Benavides, [place] Pre-anesthesia Checklist: Patient identified, Emergency Drugs available, Suction available and Patient being monitored Patient Re-evaluated:Patient Re-evaluated prior to inductionOxygen Delivery Method: Circle System Utilized Preoxygenation: Pre-oxygenation with 100% oxygen Intubation Type: IV induction Ventilation: Mask ventilation without difficulty LMA: LMA inserted LMA Size: 4.0 Number of attempts: 1 Airway Equipment and Method: Bite block Placement Confirmation: positive ETCO2 Tube secured with: Tape Dental Injury: Teeth and Oropharynx as per pre-operative assessment      

## 2015-01-01 NOTE — Progress Notes (Signed)
Called Dr.Kuzma's office and gave status report of pt and orders were received for pt to be discharged to home.  Instructed wife to check temp tonight and call MD office as needed.

## 2015-01-01 NOTE — Discharge Instructions (Addendum)

## 2015-01-01 NOTE — Telephone Encounter (Signed)
Patient needs repeat LFTs upper abdominal ultrasound and office visit.

## 2015-01-02 ENCOUNTER — Encounter (HOSPITAL_BASED_OUTPATIENT_CLINIC_OR_DEPARTMENT_OTHER): Payer: Self-pay | Admitting: Orthopedic Surgery

## 2015-01-02 ENCOUNTER — Other Ambulatory Visit (INDEPENDENT_AMBULATORY_CARE_PROVIDER_SITE_OTHER): Payer: Self-pay | Admitting: Internal Medicine

## 2015-01-02 DIAGNOSIS — R7989 Other specified abnormal findings of blood chemistry: Secondary | ICD-10-CM

## 2015-01-02 DIAGNOSIS — R945 Abnormal results of liver function studies: Principal | ICD-10-CM

## 2015-01-02 NOTE — Op Note (Signed)
Lee Klein, Lee Klein                ACCOUNT NO.:  [id number]  MEDICAL RECORD NO.:  [id number]  LOCATION:                                 FACILITY:  PHYSICIAN:  Cindee Salt, M.D.       DATE OF BIRTH:  25-Sep-1950  DATE OF PROCEDURE:  01/01/2015 DATE OF DISCHARGE:                              OPERATIVE REPORT   PREOPERATIVE DIAGNOSIS:  Atypical mycobacterial infection, radius and ulna of left hand.  POSTOPERATIVE DIAGNOSIS:  Atypical mycobacterial infection, radius and ulna of left hand.  OPERATION:  Incision and drainage, opening of distal radius and distal ulna, left wrist.  SURGEON:  Cindee Salt, M.D.  ANESTHESIA:  General with local infiltration.  ANESTHESIOLOGIST:  Edwards.  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  He has undergone multiple procedures.  He is admitted now for swelling over the dorsal aspect of his distal radius and pain on the ulnar aspect over his ulna.  X-rays reveal a defect in the distal radius and ulna indicative of the osteomyelitis present.  He is admitted now for incision and drainage, opening the cortex of each with re-culturing.  Pre, peri, and postoperative courses have been discussed along with risks and complications.  He is well aware of each of these having undergone multiple procedures.  In the preoperative area, the patient is seen, the extremity marked by both patient and surgeon.  Antibiotic for bacterial infection given.  PROCEDURE IN DETAIL:  The patient was brought to the operating room, where general anesthetic was carried out without difficulty.  He was prepped using Betadine scrub and solution with the left arm free.  Time- out taken confirming patient and procedure.  The limb was exsanguinated from the distal forearm proximally, and a tourniquet placed on the upper arm inflated to 250 mmHg.  A longitudinal  incision was then made over the distal radius and area of new slight opening, this was carried down through subcutaneous tissue.  The extensor retinaculum was incised.  The extensor fourth dorsal compartment was opened.  The tendon separated. The cortex of the distal radius was then opened with an awl and curettes.  This was enlarged, cultures were immediately taken for the granulation purulent material recovered, this was sent to Pathology. This was then copiously irrigated with saline until clear using feeding tube to be certain that the entire cavity was irrigated.  This was then packed with iodoform gauze.  A separate incision was then made over the distal ulna, again carried down through subcutaneous tissue.  This was done over the ulnar margin allowing entrance into the bone between the extensor carpi ulnaris and flexor carpi ulnaris.  The cavity was opened, this again was irrigated clear and packed open.  The local area was infiltrated with 0.25% bupivacaine without epinephrine, approximately 5 mL was used.  Sterile compressive dressing, volar splint applied.  On deflation of the tourniquet, all fingers immediately pinked.  He was taken to the recovery room for observation in satisfactory condition.  He is discharged home to return to the [facility]  of [place] in 3 days, on Vicodin.  He will be maintained on his antibiotics per the CDC.          ______________________________ Cindee Salt, M.D.     GK/MEDQ  D:  01/01/2015  T:  01/02/2015  Job:  [id number]

## 2015-01-02 NOTE — Telephone Encounter (Signed)
Spoke to Ms Darral, Rishel had [name] 12/06/14 by Dr Neita Carp, report on your desk and he has a home health nurse come out once a week to draw blood, she asked that the lab order be mailed to them so home health nurse can draw when she comes out Monday, lab order mailed

## 2015-01-03 ENCOUNTER — Telehealth: Payer: Self-pay

## 2015-01-03 NOTE — Telephone Encounter (Signed)
[facility] , Pharm[facility]) calling regarding elevated LFT's. Please advise  Laurell Josephs, RN

## 2015-01-03 NOTE — Telephone Encounter (Signed)
Tammy these look pretty similar to what he had last Thursday.  Discussed with [name]. Hold the course. He is also going to see a GI MD. 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

## 2015-01-05 LAB — TISSUE CULTURE: CULTURE: NO GROWTH

## 2015-01-06 LAB — ANAEROBIC CULTURE

## 2015-01-07 ENCOUNTER — Ambulatory Visit (HOSPITAL_COMMUNITY): Payer: Managed Care, Other (non HMO)

## 2015-01-09 LAB — AFB CULTURE WITH SMEAR (NOT AT ARMC)
Acid Fast Smear: NONE SEEN
Acid Fast Smear: NONE SEEN

## 2015-01-11 NOTE — Telephone Encounter (Signed)
His supply for the next 3 months has arrived. Marcelino Duster is going to call pt to get med.

## 2015-01-11 NOTE — Telephone Encounter (Signed)
Left message notifying patient of medication ready for pick up.  In triage.

## 2015-01-14 NOTE — Telephone Encounter (Signed)
Excellent

## 2015-01-15 ENCOUNTER — Ambulatory Visit (INDEPENDENT_AMBULATORY_CARE_PROVIDER_SITE_OTHER): Payer: Managed Care, Other (non HMO) | Admitting: Internal Medicine

## 2015-01-15 ENCOUNTER — Telehealth: Payer: Self-pay | Admitting: *Deleted

## 2015-01-15 ENCOUNTER — Encounter (INDEPENDENT_AMBULATORY_CARE_PROVIDER_SITE_OTHER): Payer: Self-pay | Admitting: Internal Medicine

## 2015-01-15 VITALS — BP 146/84 | HR 86 | Temp 97.9°F | Resp 18 | Ht 70.0 in | Wt 159.6 lb

## 2015-01-15 DIAGNOSIS — R7989 Other specified abnormal findings of blood chemistry: Secondary | ICD-10-CM

## 2015-01-15 DIAGNOSIS — E876 Hypokalemia: Secondary | ICD-10-CM | POA: Diagnosis not present

## 2015-01-15 DIAGNOSIS — Z658 Other specified problems related to psychosocial circumstances: Secondary | ICD-10-CM

## 2015-01-15 DIAGNOSIS — F439 Reaction to severe stress, unspecified: Secondary | ICD-10-CM

## 2015-01-15 DIAGNOSIS — K50919 Crohn's disease, unspecified, with unspecified complications: Secondary | ICD-10-CM

## 2015-01-15 DIAGNOSIS — IMO0001 Reserved for inherently not codable concepts without codable children: Secondary | ICD-10-CM

## 2015-01-15 DIAGNOSIS — K219 Gastro-esophageal reflux disease without esophagitis: Secondary | ICD-10-CM | POA: Diagnosis not present

## 2015-01-15 DIAGNOSIS — E2749 Other adrenocortical insufficiency: Secondary | ICD-10-CM

## 2015-01-15 DIAGNOSIS — R945 Abnormal results of liver function studies: Principal | ICD-10-CM

## 2015-01-15 MED ORDER — PANTOPRAZOLE SODIUM 40 MG PO TBEC: 40.0000 mg | DELAYED_RELEASE_TABLET | Freq: Every day | ORAL | Status: AC

## 2015-01-15 MED ORDER — POTASSIUM CHLORIDE ER 10 MEQ PO TBCR: 20.0000 meq | EXTENDED_RELEASE_TABLET | Freq: Every day | ORAL | Status: DC

## 2015-01-15 MED ORDER — ALPRAZOLAM 0.5 MG PO TABS: ORAL_TABLET | ORAL | Status: DC

## 2015-01-15 MED ORDER — PREDNISONE 5 MG PO TABS: 7.5000 mg | ORAL_TABLET | Freq: Every day | ORAL | Status: DC

## 2015-01-15 NOTE — Telephone Encounter (Signed)
Per Clinton, ok to take as prescribed.  Notified patient's wife.  He will start it today.

## 2015-01-15 NOTE — Progress Notes (Signed)
Presenting complaint;  Abnormal LFTs and history of Crohn's disease.  Subjective:  Patient is 64 year old Caucasian male who is here for scheduled visit. He has multiple problems including Crohn's disease and he is undergoing therapy for osteomyelitis secondary to atypical mycobacterial infection involving left hand. He was noted have mildly elevated transaminases and underwent abdominal ultrasound about 6 weeks ago suggesting fatty liver and raising possibility of cirrhosis. Patient denies abdominal pain or pruritus. He reports no change in ileostomy output. He denies rectal discharge or bleeding. His wife states that she increase prednisone dose to 10 mg 2 weeks ago when he lost his appetite and was feeling very weak. She reminded me of similar symptoms in the past and perked up quickly with higher dose of prednisone. Patient does not feel he is having any side effects with anti-biotics. His appetite is good. He has not lost weight since his last visit almost 4 months ago. He had PICC line on 10/19/2014. He was noted to have lower extremity edema about 8 weeks ago. Was much worse on the left side. He was treated with diarrhetic's but it didn't help. He was seen by Dr. Neita Carp and underwent Doppler study and confirmed to have the venous thrombosis and given prescription for Xarelto which she will start today. He denies pain in his left leg. He also denies chest pain or shortness of breath. He feels heartburns well controlled with therapy. He has lost his hearing and his family communicates with him by writing. He has been referred to [facility] but he would like to wait until he is completed therapy for DVT. He has some soreness involving left hand and states swelling has gone way down.     Current Medications: Outpatient Encounter Prescriptions as of 01/15/2015  Medication Sig  . ALPRAZolam (XANAX) 0.5 MG tablet TAKE 1 TABLET BY MOUTH TWICE A DAY AS NEEDED FOR ANXIETY  . Ascorbic Acid (VITAMIN C)  1000 MG tablet Take 1,000 mg by mouth daily.  Marland Kitchen azithromycin (ZITHROMAX) 600 MG tablet Take 1 tablet (600 mg total) by mouth daily.  . Calcium Carbonate-Vitamin D (CALCIUM 600 + D PO) Take 1 tablet by mouth daily.   . CLOFAZIMINE PO Take 100 mg by mouth at bedtime.   Marland Kitchen HYDROcodone-acetaminophen (NORCO) 10-325 MG per tablet Take 1 tablet by mouth every 6 (six) hours as needed. (Patient taking differently: Take 1 tablet by mouth every 6 (six) hours as needed for moderate pain. )  . loperamide (IMODIUM A-D) 2 MG tablet Take 8 mg by mouth 2 (two) times daily.   . Multiple Vitamins-Minerals (MULTIVITAMIN PO) Take 1 tablet by mouth daily.   Marland Kitchen nystatin (MYCOSTATIN/NYSTOP) 100000 UNIT/GM POWD Apply topically daily as needed (FOR IRRITATION). PRN  . pantoprazole (PROTONIX) 40 MG tablet Take 1 tablet (40 mg total) by mouth 2 (two) times daily before a meal.  . predniSONE (DELTASONE) 5 MG tablet Take 5 mg by mouth daily with breakfast.  . promethazine (PHENERGAN) 25 MG tablet Take 1 tablet (25 mg total) by mouth as needed for nausea or vomiting.  . Rivaroxaban (XARELTO) 15 MG TABS tablet Take 15 mg by mouth 2 (two) times daily with a meal.  . tigecycline 50 mg in sodium chloride 0.9 % 100 mL Inject 50 mg into the vein every 12 (twelve) hours.  . Zinc 50 MG CAPS Take 1 capsule by mouth daily.   . [DISCONTINUED] furosemide (LASIX) 20 MG tablet Take 20 mg by mouth.  . [DISCONTINUED] HYDROcodone-acetaminophen (NORCO) 10-325 MG  per tablet Take 1 tablet by mouth every 6 (six) hours as needed. (Patient not taking: Reported on 01/15/2015)   No facility-administered encounter medications on file as of 01/15/2015.     Objective: Blood pressure 146/84, pulse 86, temperature 97.9 F (36.6 C), temperature source Oral, resp. rate 18, height  (1.778 m), weight 159 lb 9.6 oz (72.394 kg). Patient is alert and in no acute distress. His hearing is gone. Conjunctiva is pink. Sclera is nonicteric Oropharyngeal mucosa  is normal. No neck masses or thyromegaly noted. Cardiac exam with regular rhythm normal S1 and S2. No murmur or gallop noted. Lungs are clear to auscultation. Abdomen. Ileostomy bag is located in right mid abdomen. There is peristomal hernia. Abdomen is soft and nontender without organomegaly or masses. He has trace edema to right leg and he has 2-3+ pitting edema to left leg with calf is not tender. It is not warm either.  Labs/studies Results:   LFTs from 11/13/2014 AST 22, ALT 29 and AP 142 Serum albumin 2.6  LFTs from 11/26/2014 AST 44, ALT 53 and alkaline phosphatase 170 Serum albumin 2.7  Lab data from 01/14/2015 WBC 12.3, H&H 12.9 and 38.9 and platelet count 192K BUN 23, creatinine 1.24 glucose 1:15 Serum sodium 144, potassium 3.3, chloride 99, CO2 23, serum calcium 8.2 Bilirubin 0.7, AP 282, AST 34, ALT 50 and albumin 2.7  Abdominopelvic CT from 11/06/2014 reviewed. No evidence of cirrhosis or splenomegaly.  Ultrasound from 11/26/2014 also reviewed. Echogenic liver    Assessment:  #1. Abnormal LFTs. Mildly elevated transaminases appear to be secondary to fatty liver. Trend is one of improvement and/or stable pattern. Therefore one does not have to worry about changing his medications. Elevated alkaline phosphatase is most likely from skeletal source given history of osteomyelitis. If alkaline phosphatase continue to rise will need to fractionate it and find out if the sources hepatic which will lead to further GI workup. Patient's serum albumin is low which I believe is secondary to chronic illness and not necessarily marker for chronic liver disease. #2. Iatrogenic adrenal insufficiency. Patient has been on prednisone since 1977 when he was diagnosed with Crohn's disease long before I was involved in his care. Multiple attempts in the past have not been successful to get him off prednisone even when dose was reduced by 1 mg every few weeks. He also developed symptoms of  adrenal insufficiency when dose was reduced to below 5. Will try him on 7.5 mg daily and see how he does. #3. GERD. He is doing well with therapy. PPI dose could be reduced. #4. Stress and anxiety secondary to debilitating illness due to osteomyelitis involving left handdue to atypical mycobacterial in fection. #5. Mild hypokalemia. Patient is not on diabetic therapy. Will treat with oral KCl for 10 days. #6. Crohn's disease. He appears to be in remission. He has ileostomy. He still has the rectum in situ but nothing to suggest divergent colitis. 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   Plan:  Patient and his wife were reassured that patient does not have any evidence of advanced liver disease or cirrhosis. Patient will continue to have periodic LFTs and we will monitor trend. Decrease pantoprazole to 40 mg by mouth every morning. New prescription given for alprazolam 0.5 mg by mouth twice a day when necessary. Decrease prednisone to 7.5 mg by mouth every morning. KCl 20 mEq by mouth daily for 10 days. Office visit in 3 months.

## 2015-01-15 NOTE — Patient Instructions (Signed)
Continue potassium until prescription runs out. Next blood work in 2 weeks as planned. Decrease prednisone to 7.5 mg daily with breakfast. Decrease pantoprazole to 40 mg by mouth daily before breakfast.

## 2015-01-15 NOTE — Telephone Encounter (Signed)
Patient with blood clot in his left leg per ultrasound on 8/30 at primary care physician.  PCP wants to put patient on xarelto, 15 mg BID for 3 weeks, then 20 mg QDay for 3-6 months. Patient's wife is calling to make Dr. Daiva Eves aware of this as he is not on EPIC.  Please advise if you have any objections or concerns.   Per wife, the patient's hand looks great.   Phone: [phone] cell or [phone] home.  [facility] in [place] Dr. Neita Carp. [phone]  'cc [name]

## 2015-01-15 NOTE — Telephone Encounter (Signed)
I dont think it is an issue with re to his abx. Agree with cc [place], Cassie

## 2015-01-16 NOTE — Telephone Encounter (Signed)
Patient seen in the office on 01/15/2015

## 2015-01-28 ENCOUNTER — Encounter (INDEPENDENT_AMBULATORY_CARE_PROVIDER_SITE_OTHER): Payer: Self-pay

## 2015-02-13 LAB — AFB CULTURE WITH SMEAR (NOT AT ARMC): Acid Fast Smear: NONE SEEN

## 2015-02-20 ENCOUNTER — Encounter (INDEPENDENT_AMBULATORY_CARE_PROVIDER_SITE_OTHER): Payer: Self-pay

## 2015-02-28 ENCOUNTER — Encounter: Payer: Self-pay | Admitting: Infectious Disease

## 2015-02-28 ENCOUNTER — Encounter (INDEPENDENT_AMBULATORY_CARE_PROVIDER_SITE_OTHER): Payer: Self-pay

## 2015-03-14 ENCOUNTER — Telehealth: Payer: Self-pay | Admitting: *Deleted

## 2015-03-14 NOTE — Telephone Encounter (Signed)
Faxing weekly lab work - elevated liver function tests.  Received fax, shared information with Dr. Daiva Eves.  Dr Daiva Eves states that these results are not related to the pt's antibiotics.  Lab results faxed to patient's GI MD office.  Phone call to [facility], shared Dr. Clinton Gallant comments.

## 2015-03-25 ENCOUNTER — Encounter (INDEPENDENT_AMBULATORY_CARE_PROVIDER_SITE_OTHER): Payer: Self-pay | Admitting: Internal Medicine

## 2015-03-25 ENCOUNTER — Ambulatory Visit (INDEPENDENT_AMBULATORY_CARE_PROVIDER_SITE_OTHER): Payer: Managed Care, Other (non HMO) | Admitting: Internal Medicine

## 2015-03-25 VITALS — BP 118/76 | HR 70 | Temp 97.5°F | Resp 18 | Ht 70.0 in | Wt 161.0 lb

## 2015-03-25 DIAGNOSIS — IMO0001 Reserved for inherently not codable concepts without codable children: Secondary | ICD-10-CM

## 2015-03-25 DIAGNOSIS — R7989 Other specified abnormal findings of blood chemistry: Secondary | ICD-10-CM

## 2015-03-25 DIAGNOSIS — R600 Localized edema: Secondary | ICD-10-CM

## 2015-03-25 DIAGNOSIS — M869 Osteomyelitis, unspecified: Secondary | ICD-10-CM

## 2015-03-25 DIAGNOSIS — R197 Diarrhea, unspecified: Secondary | ICD-10-CM

## 2015-03-25 DIAGNOSIS — K219 Gastro-esophageal reflux disease without esophagitis: Secondary | ICD-10-CM

## 2015-03-25 DIAGNOSIS — K50919 Crohn's disease, unspecified, with unspecified complications: Secondary | ICD-10-CM

## 2015-03-25 DIAGNOSIS — E2749 Other adrenocortical insufficiency: Secondary | ICD-10-CM | POA: Diagnosis not present

## 2015-03-25 DIAGNOSIS — F411 Generalized anxiety disorder: Secondary | ICD-10-CM

## 2015-03-25 DIAGNOSIS — R945 Abnormal results of liver function studies: Principal | ICD-10-CM

## 2015-03-25 MED ORDER — DIPHENOXYLATE-ATROPINE 2.5-0.025 MG PO TABS: 1.0000 | ORAL_TABLET | Freq: Four times a day (QID) | ORAL | Status: DC | PRN

## 2015-03-25 MED ORDER — ALPRAZOLAM 0.5 MG PO TABS: ORAL_TABLET | ORAL | Status: DC

## 2015-03-25 MED ORDER — HYDROCODONE-ACETAMINOPHEN 10-325 MG PO TABS: 1.0000 | ORAL_TABLET | Freq: Four times a day (QID) | ORAL | Status: DC | PRN

## 2015-03-25 MED ORDER — PREDNISONE 5 MG PO TABS: 10.0000 mg | ORAL_TABLET | Freq: Every day | ORAL | Status: DC

## 2015-03-25 NOTE — Progress Notes (Signed)
Prescription was called to the pharmacy.

## 2015-03-25 NOTE — Patient Instructions (Signed)
Take prednisone 10 mg 4 times a week and 7.5 mg 3 times a week. Physician will call with results of blood tests when completed.

## 2015-03-25 NOTE — Progress Notes (Signed)
Presenting complaint;  Follow-up for abnormal LFTs, Crohn disease and multiple other problems.  Subjective:  Lee Klein is 64 year old Caucasian male who is here for scheduled visit accompanied by his wife pain. He was last seen on 01/15/2015. He reports increase in colostomy output over the last 3 weeks. Imodium is not working anymore. He is having to empty back 8 or 9 times in a 24-hour period. He denies melena or bleeding into colostomy. Appetite has improved but is not normal. His weight is up 5 pound and a half since his last visit. He rarely has heartburn unless he eats greasy or fried food. He continues to feel weak. He gets tired very quickly. Pain in left wrist has decreased in swelling has gone down but stiffness persists. He is taking pain medication at least twice daily. He states low extremity edema has not resolved. He is taking furosemide on as-needed basis. His hearing has not improved. His wife generally communicates with them by writing. He also complains of intermittent pain in his feet and behind his knees. He has not experienced abdominal pain since his last visit. He is not having fever chills or night sweats. His wife feels that he needs to be on a higher dose of prednisone. She tells me that he was doing much better when he was on 10 mg daily. Patient is not sure how long he will have to continue antibiotics.   Current Medications: Outpatient Encounter Prescriptions as of 03/25/2015  Medication Sig  . ALPRAZolam (XANAX) 0.5 MG tablet TAKE 1 TABLET BY MOUTH TWICE A DAY AS NEEDED FOR ANXIETY  . Ascorbic Acid (VITAMIN C) 1000 MG tablet Take 1,000 mg by mouth daily.  Marland Kitchen azithromycin (ZITHROMAX) 600 MG tablet Take 1 tablet (600 mg total) by mouth daily.  . Calcium Carbonate-Vitamin D (CALCIUM 600 + D PO) Take 1 tablet by mouth daily.   . CLOFAZIMINE PO Take 100 mg by mouth at bedtime.   . folic acid (FOLVITE) 1 MG tablet Take 1 mg by mouth daily.  . furosemide (LASIX) 40 MG tablet  TAKE 1 TABLET BY MOUTH EVERY MORNING AS NEEDED FOR SWELLING  . HYDROcodone-acetaminophen (NORCO) 10-325 MG per tablet Take 1 tablet by mouth every 6 (six) hours as needed. (Patient taking differently: Take 1 tablet by mouth every 6 (six) hours as needed for moderate pain. )  . loperamide (IMODIUM A-D) 2 MG tablet Take 8 mg by mouth 2 (two) times daily.   . Multiple Vitamins-Minerals (MULTIVITAMIN PO) Take 1 tablet by mouth daily.   Marland Kitchen nystatin (MYCOSTATIN/NYSTOP) 100000 UNIT/GM POWD Apply topically daily as needed (FOR IRRITATION). PRN  . pantoprazole (PROTONIX) 40 MG tablet Take 1 tablet (40 mg total) by mouth daily before breakfast.  . predniSONE (DELTASONE) 5 MG tablet Take 1.5 tablets (7.5 mg total) by mouth daily with breakfast.  . promethazine (PHENERGAN) 25 MG tablet Take 1 tablet (25 mg total) by mouth as needed for nausea or vomiting.  . tigecycline 50 mg in sodium chloride 0.9 % 100 mL Inject 50 mg into the vein every 12 (twelve) hours.  Carlena Hurl 20 MG TABS tablet Take 20 mg by mouth daily.  . Zinc 50 MG CAPS Take 1 capsule by mouth daily.   . [DISCONTINUED] potassium chloride (K-DUR) 10 MEQ tablet Take 2 tablets (20 mEq total) by mouth daily. (Patient not taking: Reported on 03/25/2015)  . [DISCONTINUED] Rivaroxaban (XARELTO) 15 MG TABS tablet Take 20 mg by mouth daily.    No facility-administered encounter medications  on file as of 03/25/2015.     Objective: Blood pressure 118/76, pulse 70, temperature 97.5 F (36.4 C), temperature source Oral, resp. rate 18, height  (1.778 m), weight 161 lb (73.029 kg). Patient is alert and in no acute distress. He has complete hearing loss. I communicated with him by writing on a piece of paper. Conjunctiva is pink. Sclera is nonicteric Oropharyngeal mucosa is normal. No neck masses or thyromegaly noted. Cardiac exam with regular rhythm normal S1 and S2. No murmur or gallop noted. Lungs are clear to auscultation. Abdomen. Colostomy is  located in the right mid abdomen and colostomy bag as large amount of brownish liquid stool. Bowel sounds are normal. Peristomal bulge noted consistent with hernia. Abdomen is soft and nontender without organomegaly or masses.  Left wrist is warm to touch with mild tenderness and muscle atrophy and limited flexion. 2+ pitting edema noted to both legs.   Labs/studies Results: Lab data from 03/11/2015  WBC 12.2, H&H 11.7 and 37.5 and platelet counts 283K.  Diff reveals 85% neutrophils.  Sedimentation rate 14  CRP 6.7  Serum sodium 144, potassium 4.2, chloride 104, CO2 17, glucose 112, BUN 18, creatinine 1.41.  Serum calcium 8.4  Bilirubin 0.7, AP 426, AST 60, ALT 84, total protein 5.2 with albumin of 2.6.   Lab data from 03/18/2015  WBC 7.3, H&H 10.8 and 33.4 and platelet count 164K  Differential reveals 69% neutrophils  Sedimentation rate is 4  CRP 21.6  Serum sodium 140, potassium 4.5, chloride 107, CO2 19, glucose 74, BUN 19 and creatinine 1.14  Bilirubin 1.1, AP 291, AST 35, ALT 60, total protein 4.6, albumin 2.4  Serum calcium 7.9.    Assessment:  #1. Abnormal LFTs. He has both hepatocellular and cholestatic pattern. This abnormality is felt to be secondary to antibiotics. It is reassuring to note the transaminases and alkaline phosphatase are down. Serum albumin remains low which is worrisome. I do not believe hypoalbuminemia is secondary to diminished intake. Ultrasound in July this year revealed fatty liver and lobulated surface. However he does not have any other stigmata of cirrhosis or portal hypertension. #2. GERD. He is doing well with therapy. Will consider dropping dose of an antibiotic therapy completed. #3. Crohn's disease. He has history of small and large bowel disease. Colostomy output has increased but he does not have other symptoms to suggest a relapse. If he does not respond to different antidiarrheal will consider further evaluation. #4. Iatrogenic adrenal  insufficiency. Patient has been on prednisone 7.5 mg daily since his last visit. Will try him on higher doses see if he does any better as far as his appetite and weakness is concerned. #5. Left wrist pain secondary to osteomyelitis due to atypical mycobacterial infection. He is still having some pain and needs pain medication refilled. CRP has increased since last check. We will wait for the next reading. #6. 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 #7. Persistent lower extremity edema. He has bilateral lower extremity edema. It appears to be stable. Edema is most likely due to low serum albumin. He may also have an element of venous insufficiency. #8. Anemia secondary to chronic illness.  Plan:  Increase prednisone to 10 mg by mouth daily or 10 mg 4 times a week and 7.5 mg 3 times a week. New prescription given for prednisone, hydrocodone/acetaminophen 10/325 and alprazolam. Discontinue loperamide. Diphenoxylate 1 tablet by mouth 4 times a day. Patient will monitor stool output and call with progress report in few  weeks. Continue to monitor patient's lab studies.

## 2015-03-26 ENCOUNTER — Encounter (INDEPENDENT_AMBULATORY_CARE_PROVIDER_SITE_OTHER): Payer: Self-pay

## 2015-04-01 ENCOUNTER — Ambulatory Visit (INDEPENDENT_AMBULATORY_CARE_PROVIDER_SITE_OTHER): Payer: Managed Care, Other (non HMO) | Admitting: Internal Medicine

## 2015-04-03 ENCOUNTER — Encounter (INDEPENDENT_AMBULATORY_CARE_PROVIDER_SITE_OTHER): Payer: Self-pay

## 2015-04-03 ENCOUNTER — Encounter: Payer: Self-pay | Admitting: Infectious Disease

## 2015-04-05 ENCOUNTER — Encounter: Payer: Self-pay | Admitting: Infectious Disease

## 2015-04-05 ENCOUNTER — Encounter (INDEPENDENT_AMBULATORY_CARE_PROVIDER_SITE_OTHER): Payer: Self-pay

## 2015-04-10 ENCOUNTER — Encounter (INDEPENDENT_AMBULATORY_CARE_PROVIDER_SITE_OTHER): Payer: Self-pay

## 2015-04-16 ENCOUNTER — Telehealth: Payer: Self-pay | Admitting: *Deleted

## 2015-04-16 ENCOUNTER — Encounter: Payer: Self-pay | Admitting: Infectious Disease

## 2015-04-16 NOTE — Telephone Encounter (Signed)
Patient wife called and asked for refills on his Clofazimine. Not sure where to send this so will ask the pharmacist [name] to see if he knows anything about this. Advised the patient will call back once I figure it out.

## 2015-04-17 NOTE — Telephone Encounter (Signed)
Will let Lee Klein know to do a refill for him.

## 2015-04-18 DIAGNOSIS — I82409 Acute embolism and thrombosis of unspecified deep veins of unspecified lower extremity: Secondary | ICD-10-CM

## 2015-04-18 DIAGNOSIS — J189 Pneumonia, unspecified organism: Secondary | ICD-10-CM

## 2015-04-18 HISTORY — DX: Acute embolism and thrombosis of unspecified deep veins of unspecified lower extremity: I82.409

## 2015-04-18 HISTORY — DX: Pneumonia, unspecified organism: J18.9

## 2015-04-22 ENCOUNTER — Encounter (INDEPENDENT_AMBULATORY_CARE_PROVIDER_SITE_OTHER): Payer: Self-pay

## 2015-04-22 NOTE — Telephone Encounter (Signed)
Called his wife today to come and pick up his supply of clofazimine. She stated that he'll stop by today to get it. Lee Klein has the bottle.

## 2015-04-26 ENCOUNTER — Encounter (INDEPENDENT_AMBULATORY_CARE_PROVIDER_SITE_OTHER): Payer: Self-pay

## 2015-05-09 ENCOUNTER — Emergency Department (HOSPITAL_COMMUNITY): Payer: Managed Care, Other (non HMO)

## 2015-05-09 ENCOUNTER — Inpatient Hospital Stay (HOSPITAL_COMMUNITY): Payer: Managed Care, Other (non HMO)

## 2015-05-09 ENCOUNTER — Inpatient Hospital Stay (HOSPITAL_COMMUNITY)
Admission: EM | Admit: 2015-05-09 | Discharge: 2015-05-13 | DRG: 871 | Disposition: A | Discharge status: Home Health | Payer: Managed Care, Other (non HMO) | Attending: Internal Medicine | Admitting: Internal Medicine

## 2015-05-09 ENCOUNTER — Encounter (HOSPITAL_COMMUNITY): Payer: Self-pay

## 2015-05-09 DIAGNOSIS — R079 Chest pain, unspecified: Secondary | ICD-10-CM | POA: Diagnosis present

## 2015-05-09 DIAGNOSIS — Z91013 Allergy to seafood: Secondary | ICD-10-CM | POA: Diagnosis not present

## 2015-05-09 DIAGNOSIS — Z888 Allergy status to other drugs, medicaments and biological substances status: Secondary | ICD-10-CM | POA: Diagnosis not present

## 2015-05-09 DIAGNOSIS — K5090 Crohn's disease, unspecified, without complications: Secondary | ICD-10-CM | POA: Diagnosis present

## 2015-05-09 DIAGNOSIS — R609 Edema, unspecified: Secondary | ICD-10-CM

## 2015-05-09 DIAGNOSIS — E2740 Unspecified adrenocortical insufficiency: Secondary | ICD-10-CM | POA: Diagnosis present

## 2015-05-09 DIAGNOSIS — Z79899 Other long term (current) drug therapy: Secondary | ICD-10-CM | POA: Diagnosis not present

## 2015-05-09 DIAGNOSIS — A319 Mycobacterial infection, unspecified: Secondary | ICD-10-CM | POA: Diagnosis present

## 2015-05-09 DIAGNOSIS — E860 Dehydration: Secondary | ICD-10-CM | POA: Diagnosis present

## 2015-05-09 DIAGNOSIS — H9190 Unspecified hearing loss, unspecified ear: Secondary | ICD-10-CM | POA: Diagnosis present

## 2015-05-09 DIAGNOSIS — Z881 Allergy status to other antibiotic agents status: Secondary | ICD-10-CM

## 2015-05-09 DIAGNOSIS — J9601 Acute respiratory failure with hypoxia: Secondary | ICD-10-CM | POA: Diagnosis present

## 2015-05-09 DIAGNOSIS — Z91041 Radiographic dye allergy status: Secondary | ICD-10-CM | POA: Diagnosis not present

## 2015-05-09 DIAGNOSIS — A4153 Sepsis due to Serratia: Principal | ICD-10-CM | POA: Diagnosis present

## 2015-05-09 DIAGNOSIS — Z6821 Body mass index (BMI) 21.0-21.9, adult: Secondary | ICD-10-CM

## 2015-05-09 DIAGNOSIS — Z932 Ileostomy status: Secondary | ICD-10-CM | POA: Diagnosis not present

## 2015-05-09 DIAGNOSIS — J189 Pneumonia, unspecified organism: Secondary | ICD-10-CM | POA: Diagnosis present

## 2015-05-09 DIAGNOSIS — T365X5A Adverse effect of aminoglycosides, initial encounter: Secondary | ICD-10-CM | POA: Diagnosis present

## 2015-05-09 DIAGNOSIS — Z7952 Long term (current) use of systemic steroids: Secondary | ICD-10-CM

## 2015-05-09 DIAGNOSIS — Z87891 Personal history of nicotine dependence: Secondary | ICD-10-CM | POA: Diagnosis not present

## 2015-05-09 DIAGNOSIS — E43 Unspecified severe protein-calorie malnutrition: Secondary | ICD-10-CM | POA: Diagnosis present

## 2015-05-09 DIAGNOSIS — Z7901 Long term (current) use of anticoagulants: Secondary | ICD-10-CM

## 2015-05-09 DIAGNOSIS — I82419 Acute embolism and thrombosis of unspecified femoral vein: Secondary | ICD-10-CM | POA: Diagnosis present

## 2015-05-09 DIAGNOSIS — I82519 Chronic embolism and thrombosis of unspecified femoral vein: Secondary | ICD-10-CM | POA: Diagnosis present

## 2015-05-09 DIAGNOSIS — R0902 Hypoxemia: Secondary | ICD-10-CM

## 2015-05-09 DIAGNOSIS — T859XXA Unspecified complication of internal prosthetic device, implant and graft, initial encounter: Secondary | ICD-10-CM | POA: Diagnosis present

## 2015-05-09 DIAGNOSIS — M86642 Other chronic osteomyelitis, left hand: Secondary | ICD-10-CM | POA: Diagnosis present

## 2015-05-09 DIAGNOSIS — R600 Localized edema: Secondary | ICD-10-CM | POA: Diagnosis present

## 2015-05-09 DIAGNOSIS — K433 Parastomal hernia with obstruction, without gangrene: Secondary | ICD-10-CM | POA: Diagnosis present

## 2015-05-09 DIAGNOSIS — M869 Osteomyelitis, unspecified: Secondary | ICD-10-CM | POA: Diagnosis present

## 2015-05-09 DIAGNOSIS — E46 Unspecified protein-calorie malnutrition: Secondary | ICD-10-CM | POA: Diagnosis present

## 2015-05-09 DIAGNOSIS — I82512 Chronic embolism and thrombosis of left femoral vein: Secondary | ICD-10-CM

## 2015-05-09 DIAGNOSIS — R0789 Other chest pain: Secondary | ICD-10-CM | POA: Diagnosis present

## 2015-05-09 HISTORY — DX: Pneumonia, unspecified organism: J18.9

## 2015-05-09 HISTORY — DX: Acute embolism and thrombosis of unspecified deep veins of unspecified lower extremity: I82.409

## 2015-05-09 LAB — COMPREHENSIVE METABOLIC PANEL
ALK PHOS: 303 U/L — AB (ref 38–126)
ALT: 41 U/L (ref 17–63)
AST: 49 U/L — ABNORMAL HIGH (ref 15–41)
Albumin: 1.7 g/dL — ABNORMAL LOW (ref 3.5–5.0)
Anion gap: 7 (ref 5–15)
BILIRUBIN TOTAL: 1.5 mg/dL — AB (ref 0.3–1.2)
BUN: 18 mg/dL (ref 6–20)
CALCIUM: 8.1 mg/dL — AB (ref 8.9–10.3)
CO2: 20 mmol/L — ABNORMAL LOW (ref 22–32)
CREATININE: 1.21 mg/dL (ref 0.61–1.24)
Chloride: 114 mmol/L — ABNORMAL HIGH (ref 101–111)
Glucose, Bld: 62 mg/dL — ABNORMAL LOW (ref 65–99)
Potassium: 4 mmol/L (ref 3.5–5.1)
Sodium: 141 mmol/L (ref 135–145)
Total Protein: 4.1 g/dL — ABNORMAL LOW (ref 6.5–8.1)

## 2015-05-09 LAB — CBC
HEMATOCRIT: 35.9 % — AB (ref 39.0–52.0)
Hemoglobin: 12 g/dL — ABNORMAL LOW (ref 13.0–17.0)
MCH: 33.2 pg (ref 26.0–34.0)
MCHC: 33.4 g/dL (ref 30.0–36.0)
MCV: 99.4 fL (ref 78.0–100.0)
Platelets: 127 10*3/uL — ABNORMAL LOW (ref 150–400)
RBC: 3.61 MIL/uL — ABNORMAL LOW (ref 4.22–5.81)
RDW: 16 % — AB (ref 11.5–15.5)
WBC: 14.4 10*3/uL — ABNORMAL HIGH (ref 4.0–10.5)

## 2015-05-09 LAB — LIPASE, BLOOD: LIPASE: 27 U/L (ref 11–51)

## 2015-05-09 LAB — I-STAT TROPONIN, ED: Troponin i, poc: 0 ng/mL (ref 0.00–0.08)

## 2015-05-09 MED ORDER — RIVAROXABAN 20 MG PO TABS
20.0000 mg | ORAL_TABLET | Freq: Every day | ORAL | Status: DC
  Administered 2015-05-09 – 2015-05-13 (×5): 20 mg via ORAL
  Filled 2015-05-09 (×5): qty 1

## 2015-05-09 MED ORDER — AZITHROMYCIN 600 MG PO TABS
600.0000 mg | ORAL_TABLET | Freq: Every day | ORAL | Status: DC
  Administered 2015-05-09 – 2015-05-10 (×2): 600 mg via ORAL
  Filled 2015-05-09 (×2): qty 1

## 2015-05-09 MED ORDER — NON FORMULARY: 100.0000 mg | Freq: Every day | Status: DC

## 2015-05-09 MED ORDER — ZINC 50 MG PO CAPS: 1.0000 | ORAL_CAPSULE | Freq: Every day | ORAL | Status: DC

## 2015-05-09 MED ORDER — GUAIFENESIN-DM 100-10 MG/5ML PO SYRP
5.0000 mL | ORAL_SOLUTION | ORAL | Status: DC | PRN
  Administered 2015-05-10: 5 mL via ORAL
  Filled 2015-05-09: qty 5

## 2015-05-09 MED ORDER — PANTOPRAZOLE SODIUM 40 MG PO TBEC
40.0000 mg | DELAYED_RELEASE_TABLET | Freq: Every day | ORAL | Status: DC
  Administered 2015-05-10: 40 mg via ORAL
  Filled 2015-05-09: qty 1

## 2015-05-09 MED ORDER — PIPERACILLIN-TAZOBACTAM 3.375 G IVPB
3.3750 g | Freq: Three times a day (TID) | INTRAVENOUS | Status: DC
  Administered 2015-05-09 – 2015-05-13 (×12): 3.375 g via INTRAVENOUS
  Filled 2015-05-09 (×14): qty 50

## 2015-05-09 MED ORDER — TECHNETIUM TC 99M DIETHYLENETRIAME-PENTAACETIC ACID: 31.0000 | Freq: Once | INTRAVENOUS | Status: DC | PRN

## 2015-05-09 MED ORDER — SODIUM CHLORIDE 0.9 % IJ SOLN: 10.0000 mL | INTRAMUSCULAR | Status: DC | PRN

## 2015-05-09 MED ORDER — STUDY - INVESTIGATIONAL MEDICATION
100.0000 mg | Freq: Every day | Status: DC
  Administered 2015-05-09 – 2015-05-12 (×4): 100 mg via ORAL
  Filled 2015-05-09 (×7): qty 2

## 2015-05-09 MED ORDER — DIPHENOXYLATE-ATROPINE 2.5-0.025 MG PO TABS
1.0000 | ORAL_TABLET | Freq: Four times a day (QID) | ORAL | Status: DC | PRN
  Administered 2015-05-11 – 2015-05-13 (×4): 1 via ORAL
  Filled 2015-05-09 (×4): qty 1

## 2015-05-09 MED ORDER — FOLIC ACID 1 MG PO TABS
1.0000 mg | ORAL_TABLET | Freq: Every day | ORAL | Status: DC
  Administered 2015-05-09 – 2015-05-13 (×5): 1 mg via ORAL
  Filled 2015-05-09 (×5): qty 1

## 2015-05-09 MED ORDER — PIPERACILLIN-TAZOBACTAM 3.375 G IVPB 30 MIN
3.3750 g | Freq: Once | INTRAVENOUS | Status: DC
Start: 2015-05-09 — End: 2015-05-13

## 2015-05-09 MED ORDER — TECHNETIUM TO 99M ALBUMIN AGGREGATED
4.1000 | Freq: Once | INTRAVENOUS | Status: AC | PRN
  Administered 2015-05-09: 4 via INTRAVENOUS

## 2015-05-09 MED ORDER — ALPRAZOLAM 0.5 MG PO TABS
0.5000 mg | ORAL_TABLET | Freq: Two times a day (BID) | ORAL | Status: DC | PRN
  Administered 2015-05-10 – 2015-05-13 (×5): 0.5 mg via ORAL
  Filled 2015-05-09 (×5): qty 1

## 2015-05-09 MED ORDER — ADULT MULTIVITAMIN W/MINERALS CH
1.0000 | ORAL_TABLET | Freq: Every day | ORAL | Status: DC
  Administered 2015-05-09 – 2015-05-13 (×5): 1 via ORAL
  Filled 2015-05-09 (×5): qty 1

## 2015-05-09 MED ORDER — VITAMIN C 500 MG PO TABS
1000.0000 mg | ORAL_TABLET | Freq: Every day | ORAL | Status: DC
Start: 2015-05-09 — End: 2015-05-13
  Administered 2015-05-09 – 2015-05-13 (×4): 1000 mg via ORAL
  Filled 2015-05-09 (×4): qty 2

## 2015-05-09 MED ORDER — LEVOFLOXACIN IN D5W 750 MG/150ML IV SOLN: 750.0000 mg | Freq: Once | INTRAVENOUS | Status: DC

## 2015-05-09 MED ORDER — ZINC SULFATE 220 (50 ZN) MG PO CAPS
220.0000 mg | ORAL_CAPSULE | Freq: Every day | ORAL | Status: DC
  Administered 2015-05-09 – 2015-05-13 (×4): 220 mg via ORAL
  Filled 2015-05-09 (×5): qty 1

## 2015-05-09 MED ORDER — SODIUM CHLORIDE 0.9 % IV SOLN
50.0000 mg | Freq: Two times a day (BID) | INTRAVENOUS | Status: DC
  Administered 2015-05-09 – 2015-05-13 (×8): 50 mg via INTRAVENOUS
  Filled 2015-05-09 (×11): qty 50

## 2015-05-09 MED ORDER — HYDROCODONE-ACETAMINOPHEN 10-325 MG PO TABS
1.0000 | ORAL_TABLET | Freq: Four times a day (QID) | ORAL | Status: DC | PRN
  Administered 2015-05-09 – 2015-05-13 (×7): 1 via ORAL
  Filled 2015-05-09 (×8): qty 1

## 2015-05-09 MED ORDER — PROMETHAZINE HCL 25 MG PO TABS: 25.0000 mg | ORAL_TABLET | Freq: Three times a day (TID) | ORAL | Status: DC | PRN

## 2015-05-09 MED ORDER — ENSURE ENLIVE PO LIQD: 237.0000 mL | Freq: Two times a day (BID) | ORAL | Status: DC

## 2015-05-09 MED ORDER — PREDNISONE 10 MG PO TABS
10.0000 mg | ORAL_TABLET | Freq: Every day | ORAL | Status: DC
  Administered 2015-05-09 – 2015-05-13 (×5): 10 mg via ORAL
  Filled 2015-05-09 (×5): qty 1

## 2015-05-09 MED ORDER — MULTIVITAMIN PO LIQD: Freq: Every day | ORAL | Status: DC

## 2015-05-09 MED ORDER — ADULT MULTIVITAMIN LIQUID CH
5.0000 mL | Freq: Every day | ORAL | Status: DC
  Filled 2015-05-09: qty 5

## 2015-05-09 MED ORDER — SODIUM CHLORIDE 0.9 % IV SOLN
INTRAVENOUS | Status: DC
  Administered 2015-05-09: 19:00:00 via INTRAVENOUS
  Administered 2015-05-10: 75 mL/h via INTRAVENOUS
  Administered 2015-05-11: 17:00:00 via INTRAVENOUS

## 2015-05-09 NOTE — ED Notes (Signed)
Pt to be transported to inpatient bed after nuclear med scan.  Inpatient RN made aware.

## 2015-05-09 NOTE — Progress Notes (Signed)
NURSING PROGRESS NOTE  SHERIF SHAREEF [id number] Admission Data: 05/09/2015 8:01 PM Attending Provider: Eddie North, MD IRC:[id number],OFBP W, MD Code Status: full  Lee Klein is a 64 y.o. male patient admitted from ED:  -No acute distress noted.  -No complaints of shortness of breath.  -No complaints of chest pain.   Cardiac Monitoring: Box # 03 in place. Cardiac monitor yields:sinus tachycardia.  Blood pressure 124/67, pulse 96, temperature 98.1 F (36.7 C), temperature source Oral, resp. rate 20, height 6' (1.829 m), SpO2 90 %.   IV Fluids:  PICC in place, occlusive dsg intact without redness, right upper arm, condition patent and no redness normal saline at 75cc/hr. Dsg changed Monday, 19th by [facility] RN.   Allergies:  Amikacin; Cefoxitin; Fish allergy; Iodine; Ivp dye; Rifampin; Shellfish allergy; Vancomycin; and Demerol  Past Medical History:   has a past medical history of Crohn's disease (HCC); GERD (gastroesophageal reflux disease); Anxiety; Atypical mycobacterial infection of hand (05/2014); PICC (peripherally inserted central catheter) in place; Osteomyelitis of hand, left, acute (HCC); Hearing loss; Pancreatitis (10/2014); History of kidney stones; History of pancreatitis (11/06/2014); Mycobacterial infection; Thin skin; Septic arthritis of wrist, left (HCC); DVT (deep venous thrombosis) (HCC) (04/2015); and Pneumonia (04/2015).  Past Surgical History:   has past surgical history that includes Colonoscopy; Appendectomy (~ 2009); Ileostomy closure (N/A, 10/23/2013); Wrist arthroscopy with debridement (Left, 05/24/2014); Esophagogastroduodenoscopy (N/A, 06/15/2014); Cholecystectomy (~ 2008); Parastomal hernia repair (10/2013); Incision and drainage abscess (Left, 06/22/2014); Incision and drainage of wound (Left, 07/27/2014); Incision and drainage abscess (Left, 08/09/2014); Incision and drainage abscess (Left, 09/05/2014); I&D extremity (Left, 10/02/2014); I&D extremity (Left,  10/12/2014); Incision and drainage abscess (Left, 11/12/2014); Incision and drainage (Left, 11/27/2014); Tonsillectomy (1963); Colon surgery (~ 2010); Parastomal hernia repair (11/02/2013); ORIF distal radius fracture (Right, 06/29/2006); Ileostomy; Ileoscopy (07/02/2010); and I&D extremity (Left, 01/01/2015).  Social History:   reports that he quit smoking about 8 years ago. He has never used smokeless tobacco. He reports that he drinks alcohol. He reports that he does not use illicit drugs.  Skin: intact  Patient/Family orientated to room. Information packet given to patient/family. Admission inpatient armband information verified with patient/family to include name and date of birth and placed on patient arm. Side rails up x 2, fall assessment and education completed with patient/family. Patient/family able to verbalize understanding of risk associated with falls and verbalized understanding to call for assistance before getting out of bed. Call light within reach. Patient/family able to voice and demonstrate understanding of unit orientation instructions.    Will continue to evaluate and treat per MD orders.

## 2015-05-09 NOTE — ED Notes (Signed)
Wife cell phone #: [phone]

## 2015-05-09 NOTE — ED Notes (Signed)
Patient brought back to room via wheelchair with family in tow; patient undressed, in gown, on monitor, continuous pulse oximetry and blood pressure cuff; visitor at bedside 

## 2015-05-09 NOTE — Progress Notes (Signed)
Encouraged PT to used urinal to measure but he declined. Patient was placed on 2 liters oxygen by oncoming RN due to sats in mid80's.

## 2015-05-09 NOTE — ED Notes (Signed)
Pt comes to ED for chest pain and abdominal pain that began yesterday.  Pt reports SOB and dizziness as well.  Pain worsens with coughing and movement.  Pt has an extensive medical hx from an infection to his hand.

## 2015-05-09 NOTE — ED Notes (Signed)
Attempted report 

## 2015-05-09 NOTE — ED Provider Notes (Signed)
CSN: [id number]     Arrival date & time 05/09/15  9528 History   First MD Initiated Contact with Patient 05/09/15 [phone]     Chief Complaint  Patient presents with  . Chest Pain     (Consider location/radiation/quality/duration/timing/severity/associated sxs/prior Treatment) HPI Comments: Patient presents with chest pain and shortness of breath. He has a history of Crohn's disease. He about a year ago developed an infection of his left hand related to an infected IV insertion site. 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 He also developed pancreatitis that was thought to be related to medication usage. He presents today with chest pain and shortness of breath. Through written communication, patient states that he first had an episode of pain after he walked out to its carotids in the little bit of woodworking and came back inside. He developed pain to the center in right side of his chest. He had associated shortness of breath. It went away and he did not come to the hospital for evaluation. Yesterday he felt more short of breath. He's complaining of intermittent pain in his epigastrium and right upper chest. He's had associated shortness of breath mostly on exertion. He's also had some increase in lower leg edema. 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 He also uses Lasix intermittently. He's had no vomiting. No cough or fevers.  Patient is a 64 y.o. male presenting with chest pain.  Chest Pain Associated symptoms: abdominal pain and shortness of breath   Associated symptoms: no back pain, no cough, no diaphoresis, no dizziness, no fatigue, no fever, no headache, no nausea, no numbness, not vomiting and no weakness     Past Medical History  Diagnosis Date  . Crohn's disease (HCC)   . GERD (gastroesophageal reflux disease)   .  Anxiety   . 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 PICC (peripherally inserted central catheter) in place     right arm  . Osteomyelitis of hand, left, acute (HCC)   . Hearing loss   . Pancreatitis 10/2014  . History of kidney stones   . History of pancreatitis 11/06/2014  . Mycobacterial infection     left hand/wrist  . Thin skin   . Septic arthritis of wrist, left [facility])    Past Surgical History  Procedure Laterality Date  . Colonoscopy    . Appendectomy  ~ 2009  . Ileostomy closure N/A 10/23/2013    Procedure: ILEOSTOMY TAKEDOWN, REPAIR OF PARASTOMAL HERNIA.;  Surgeon: Cherylynn Ridges, MD;  Location: [facility];  Service: General;  Laterality: N/A;  . Wrist arthroscopy with debridement Left 05/24/2014    Procedure: LEFT ARTHROSCOPY WRIST CULTURE/BIOPSY DEBRIDEMENT;  Surgeon: Cindee Salt, MD;  Location: [facility];  Service: Orthopedics;  Laterality: Left;  . Esophagogastroduodenoscopy N/A 06/15/2014    Procedure: ESOPHAGOGASTRODUODENOSCOPY (EGD);  Surgeon: Malissa Hippo, MD;  Location: [facility];  Service: Endoscopy;  Laterality: N/A;  . Cholecystectomy  ~ 2008  . Parastomal hernia repair  10/2013  . Incision and drainage abscess Left 06/22/2014    Procedure: INCISION, DEBRIDEMENT AND IRRIGATION LEFT HAND/WRIST;  Surgeon: Cindee Salt, MD;  Location: [facility];  Service: Orthopedics;  Laterality: Left;  . Incision and drainage of wound Left 07/27/2014    Procedure: IRRIGATION AND DEBRIDEMENT WOUND;  Surgeon: Cindee Salt,  MD;  Location: [facility];  Service: Orthopedics;  Laterality: Left;  . Incision and drainage abscess Left 08/09/2014    Procedure: INCISION AND DRAINAGE LEFT WRIST;  Surgeon: Cindee Salt, MD;  Location: [facility];  Service: Orthopedics;  Laterality: Left;  . Incision and drainage abscess Left 09/05/2014    Procedure: INCISION AND DRAINAGE LEFT HAND;  Surgeon: Cindee Salt, MD;  Location:  [facility];  Service: Orthopedics;  Laterality: Left;  . I&d extremity Left 10/02/2014    Procedure: IRRIGATION AND DEBRIDEMENT  LEFT HAND;  Surgeon: Cindee Salt, MD;  Location: [facility];  Service: Orthopedics;  Laterality: Left;  . I&d extremity Left 10/12/2014    Procedure: MINOR IRRIGATION AND DEBRIDEMENT LEFT HAND;  Surgeon: Cindee Salt, MD;  Location: [facility];  Service: Orthopedics;  Laterality: Left;  . Incision and drainage abscess Left 11/12/2014    Procedure: INCISION AND DRAINAGE ABSCESS LEFT HAND;  Surgeon: Cindee Salt, MD;  Location: [facility];  Service: Orthopedics;  Laterality: Left;  . Incision and drainage Left 11/27/2014    Procedure: INCISION AND DRAINAGE LEFT HAND;  Surgeon: Cindee Salt, MD;  Location: [facility];  Service: Orthopedics;  Laterality: Left;  . Tonsillectomy  1963  . Colon surgery  ~ 2010    for a colonic stricture at the anastomosis from a colostomy reversal/notes 10/03/2013  . Parastomal hernia repair  11/02/2013  . Orif distal radius fracture Right 06/29/2006  . Ileostomy    . Ileoscopy  07/02/2010  . I&d extremity Left 01/01/2015    Procedure: IRRIGATION AND DEBRIDEMENT LEFT HAND/WRIST;  Surgeon: Cindee Salt, MD;  Location: [facility];  Service: Orthopedics;  Laterality: Left;   History reviewed. No pertinent family history. Social History  Substance Use Topics  . Smoking status: Former Smoker -- 30 years    Quit date: 05/31/2006  . Smokeless tobacco: Never Used  . Alcohol Use: 0.0 oz/week    0 Standard drinks or equivalent per week     Comment: rare    Review of Systems  Constitutional: Negative for fever, chills, diaphoresis and fatigue.  HENT: Negative for congestion, rhinorrhea and sneezing.   Eyes: Negative.   Respiratory: Positive for shortness of breath. Negative for cough and chest tightness.   Cardiovascular: Positive for chest pain. Negative for leg  swelling.  Gastrointestinal: Positive for abdominal pain. Negative for nausea, vomiting, diarrhea and blood in stool.  Genitourinary: Negative for frequency, hematuria, flank pain and difficulty urinating.  Musculoskeletal: Negative for back pain and arthralgias.  Skin: Negative for rash.  Neurological: Negative for dizziness, speech difficulty, weakness, numbness and headaches.      Allergies  Amikacin; Cefoxitin; Fish allergy; Iodine; Ivp dye; Rifampin; Shellfish allergy; Vancomycin; and Demerol  Home Medications   Prior to Admission medications   Medication Sig Start Date End Date Taking? Authorizing Provider  ALPRAZolam (XANAX) 0.5 MG tablet TAKE 1 TABLET BY MOUTH TWICE A DAY AS NEEDED FOR ANXIETY 03/25/15  Yes Malissa Hippo, MD  Ascorbic Acid (VITAMIN C) 1000 MG tablet Take 1,000 mg by mouth daily.   Yes Historical Provider, MD  azithromycin (ZITHROMAX) 600 MG tablet Take 1 tablet (600 mg total) by mouth daily. 11/09/14  Yes Ashly Hulen Skains, DO  Calcium Carbonate-Vitamin D (CALCIUM 600 + D PO) Take 1 tablet by mouth daily.    Yes Historical Provider, MD  CLOFAZIMINE PO Take 100 mg by mouth at bedtime.  Yes Historical Provider, MD  folic acid (FOLVITE) 1 MG tablet Take 1 mg by mouth daily.   Yes Historical Provider, MD  furosemide (LASIX) 40 MG tablet TAKE 1 TABLET BY MOUTH EVERY MORNING AS NEEDED FOR SWELLING 03/14/15  Yes Historical Provider, MD  HYDROcodone-acetaminophen (NORCO) 10-325 MG tablet Take 1 tablet by mouth every 6 (six) hours as needed. 03/25/15  Yes Malissa Hippo, MD  loperamide (IMODIUM A-D) 2 MG tablet Take 8 mg by mouth 2 (two) times daily as needed for diarrhea or loose stools.   Yes Historical Provider, MD  Multiple Vitamins-Minerals (MULTIVITAMIN PO) Take 1 tablet by mouth daily.    Yes Historical Provider, MD  pantoprazole (PROTONIX) 40 MG tablet Take 1 tablet (40 mg total) by mouth daily before breakfast. 01/15/15  Yes Malissa Hippo, MD  predniSONE  (DELTASONE) 5 MG tablet Take 2 tablets (10 mg total) by mouth daily with breakfast. 03/25/15  Yes Malissa Hippo, MD  promethazine (PHENERGAN) 25 MG tablet Take 1 tablet (25 mg total) by mouth as needed for nausea or vomiting. 12/07/14  Yes Len Blalock, NP  tigecycline 50 mg in sodium chloride 0.9 % 100 mL Inject 50 mg into the vein every 12 (twelve) hours. 11/09/14  Yes Ashly M Gottschalk, DO  XARELTO 20 MG TABS tablet Take 20 mg by mouth daily. 03/04/15  Yes Historical Provider, MD  Zinc 50 MG CAPS Take 1 capsule by mouth daily.    Yes Historical Provider, MD  diphenoxylate-atropine (LOMOTIL) 2.5-0.025 MG tablet Take 1 tablet by mouth 4 (four) times daily as needed for diarrhea or loose stools. Patient not taking: Reported on 05/09/2015 03/25/15   Malissa Hippo, MD  nystatin (MYCOSTATIN/NYSTOP) 100000 UNIT/GM POWD Apply topically daily as needed (FOR IRRITATION). PRN 03/13/14   Historical Provider, MD   BP 134/73 mmHg  Pulse 83  Resp 16  SpO2 94% Physical Exam  Constitutional: He is oriented to person, place, and time. He appears well-developed and well-nourished.  HENT:  Head: Normocephalic and atraumatic.  Eyes: Pupils are equal, round, and reactive to light.  Neck: Normal range of motion. Neck supple.  Cardiovascular: Normal rate, regular rhythm and normal heart sounds.   Pulmonary/Chest: Effort normal and breath sounds normal. No respiratory distress. He has no wheezes. He has no rales. He exhibits no tenderness.  Abdominal: Soft. Bowel sounds are normal. There is no tenderness. There is no rebound and no guarding.  Musculoskeletal: Normal range of motion. He exhibits edema (bilateral lower extremity edema, no calf tenderness).  Lymphadenopathy:    He has no cervical adenopathy.  Neurological: He is alert and oriented to person, place, and time.  Skin: Skin is warm and dry. No rash noted.  Psychiatric: He has a normal mood and affect.    ED Course  Procedures (including  critical care time) Labs Review Labs Reviewed  CBC - Abnormal; Notable for the following:    WBC 14.4 (*)    RBC 3.61 (*)    Hemoglobin 12.0 (*)    HCT 35.9 (*)    RDW 16.0 (*)    Platelets 127 (*)    All other components within normal limits  COMPREHENSIVE METABOLIC PANEL - Abnormal; Notable for the following:    Chloride 114 (*)    CO2 20 (*)    Glucose, Bld 62 (*)    Calcium 8.1 (*)    Total Protein 4.1 (*)    Albumin 1.7 (*)    AST 49 (*)  Alkaline Phosphatase 303 (*)    Total Bilirubin 1.5 (*)    All other components within normal limits  LIPASE, BLOOD  I-STAT TROPOININ, ED    Imaging Review Dg Chest 2 View  05/09/2015  CLINICAL DATA:  Chest pain, history of pancreatitis EXAM: CHEST  2 VIEW COMPARISON:  11/07/2014 FINDINGS: Cardiomediastinal silhouette is stable. Right arm PICC line with tip in SVC right atrium junction. There is no pneumothorax. There is streaky interstitial infiltrate in right middle lobe and left perihilar. Degenerative changes bilateral shoulders. No pleural effusion. IMPRESSION: No pulmonary edema. Streaky interstitial infiltrate right middle lobe and left perihilar. Right PICC line is unchanged in position. Degenerative changes bilateral shoulders. Electronically Signed   By: Natasha Mead M.D.   On: 05/09/2015 09:42   Dg Abd 1 View  05/09/2015  CLINICAL DATA:  Epigastric and umbilical pain for 2 days. EXAM: ABDOMEN - 1 VIEW COMPARISON:  CT 11/06/2014 FINDINGS: Surgical clips in the right upper quadrant and left upper quadrant. Nonobstructive bowel gas pattern. Ostomy noted in the right lower quadrant. No free air organomegaly. No suspicious calcification. IMPRESSION: Right lower quadrant ostomy. No evidence of obstruction or free air. Electronically Signed   By: Charlett Nose M.D.   On: 05/09/2015 11:44   I have personally reviewed and evaluated these images and lab results as part of my medical decision-making.   EKG Interpretation   Date/Time:   Thursday May 09 2015 08:54:39 EST Ventricular Rate:  98 PR Interval:  148 QRS Duration: 90 QT Interval:  371 QTC Calculation: 474 R Axis:   86 Text Interpretation:  Sinus rhythm Ventricular premature complex  Borderline right axis deviation Low voltage, precordial leads Nonspecific  T abnormalities, anterior leads since last tracing no significant change  Confirmed by Siaosi Alter  MD, Tynetta Bachmann ([id number]) on 05/09/2015 12:32:10 PM      MDM   Final diagnoses:  Chest pain, unspecified chest pain type    Patient presents with intermittent chest pain and exertional shortness of breath. He did not have any current chest pain. He does have some streaky infiltrates on his x-ray. He is currently taking 3 antibiotics. He is on Zithromax, tigecycline, and a third experimental antibiotic. I consulted with Dr. Zenaida Niece dam with ID who recommends not starting the antibiotic and seen if his infiltrate progresses. He does have a mildly elevated white count but no fever. He has no ischemic changes on x-ray. His troponin is negative. He does have a history of DVT. I did a repeat Doppler on his leg due to his increased leg swelling. There is an age-indeterminate clot in the left femoral vein. The patient's wife states that this is where the clot was priorly diagnosed at [facility]. I have a low suspicion of a PE. However his oxygen saturations of been in the low 90s. Given this, I will do a VQ scan. He has an IV contrast allergy. However I feel the patient needs to be admitted to the hospitalist service anyway given his anginal type symptoms for further cardiac evaluation.  Spoke with Dr Gonzella Lex who will admit pt.  Rolan Bucco, MD 05/09/15 [phone]

## 2015-05-09 NOTE — Progress Notes (Signed)
VASCULAR LAB PRELIMINARY  PRELIMINARY  PRELIMINARY  PRELIMINARY  Bilateral lower extremity venous duplex completed.    Preliminary report:  Right ; No evidence of DVT, superficial thrombus or bakers' cyst. Left : Positive for age indeterminate DVT of the mid to distal common femoral vein. No evidence of superficial thrombus or Baker's cyst.  Prentis Langdon, RVS 05/09/2015, 11:15 AM

## 2015-05-09 NOTE — Consult Note (Signed)
ANTIBIOTIC CONSULT NOTE - INITIAL  Pharmacy Consult for Zosyn Indication: pneumonia  Allergies  Allergen Reactions  . Amikacin Other (See Comments)    CAUSED DEAFNESS  . Cefoxitin Nausea Only    ABD. PAIN  . Fish Allergy Nausea And Vomiting  . Iodine Nausea Only    JOINT PAIN  . Ivp Dye [Iodinated Diagnostic Agents] Nausea Only    JOINT PAIN  . Rifampin Nausea And Vomiting and Other (See Comments)    CHILLS  . Shellfish Allergy Swelling  . Vancomycin Other (See Comments)    Red Man Syndrome - was told that it was given to fast.  . Demerol Rash    Patient Measurements:    Vital Signs: BP: 115/64 mmHg (12/22 1330) Pulse Rate: 91 (12/22 1330) Intake/Output from previous day:   Intake/Output from this shift:    Labs:  Recent Labs  05/09/15 0915  WBC 14.4*  HGB 12.0*  PLT 127*  CREATININE 1.21   CrCl cannot be calculated (Unknown ideal weight.). No results for input(s): VANCOTROUGH, VANCOPEAK, VANCORANDOM, GENTTROUGH, GENTPEAK, GENTRANDOM, TOBRATROUGH, TOBRAPEAK, TOBRARND, AMIKACINPEAK, AMIKACINTROU, AMIKACIN in the last 72 hours.   Microbiology: No results found for this or any previous visit (from the past 720 hour(s)).  Medical History: Past Medical History  Diagnosis Date  . Crohn's disease (HCC)   . GERD (gastroesophageal reflux disease)   . Anxiety   . 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 PICC (peripherally inserted central catheter) in place     right arm  . Osteomyelitis of hand, left, acute (HCC)   . Hearing loss   . Pancreatitis 10/2014  . History of kidney stones   . History of pancreatitis 11/06/2014  . Mycobacterial infection     left hand/wrist  . Thin skin   . Septic arthritis of wrist, left (HCC)     Medications:  Scheduled:     Assessment: 48 yoM presenting to the ED with SOB and productive cough. Pharmacy Consulted to dose Zosyn. WBC 14.4, afeb, CrCl ~64  Goal of Therapy:  Eradication of  infection  Plan:  Zosyn 3.375G IV over x1, followed by Zosyn 3.375G IV EI (over 4 hours) Will continue to follow renal function, culture results, LOT, and antibiotic de-escalation plans   Remi Haggard, PharmD Clinical Pharmacist- Resident Pager: [phone]  Remi Haggard 05/09/2015,1:49 PM

## 2015-05-09 NOTE — H&P (Signed)
[facility] History and Physical  Lee Klein:[id number] DOB: May 13, 1951 DOA: 05/09/2015  Referring physician: Dr. Fredderick Phenix PCP: [name], MD   Chief Complaint:  Shortness of breath with productive cough for 2-3 days associated with right-sided chest pain URI Symptoms for past 2 weeks  As hearing impairment and communicates in writing. Wife at bedside who helped with full history.  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 He is currently on 3 different antibiotics (tigecycline, azithromycin and clofazimine) and sees Dr. Zenaida Niece dam. He was also diagnosed to have left leg DVT in October by his primary and has been on Xarelto since then. For the past 2-3 days patient has been short of breath on exertion and has right-sided chest discomfort with deep inspiration and some movement. He reports cough with yellowish phlegm for the same duration and having some chills but no fever. He reports having a runny nose with some URI symptoms for the past 2 weeks. -He denies missing out on any of his medications, sick contact or recent travel. Patient denies headache, dizziness,  nausea , vomiting, palpitations, abdominal pain, bowel or urinary symptoms. Denies change in weight or appetite.  Course in the ED Patient's vitals were stable except for O2 sat in low 90s. Blood work showed WBC of 14.4 K, hemoglobin of 12, platelets of 127. Chemistry was unremarkable except for glucose of 62. Doppler of his lower extremities showed 8 undetermined DVT of the mid to distal left common femoral vein. Chest x-ray showed right middle lobe and left perihilar infiltrate. Hospitalists  admission requested to telemetry.  Review of Systems:  Constitutional:chills,  poor appetite and fatigue, denies liver or diaphoresis  HEENT: Hearing loss, runny nose, productive cough, denies congestion, difficulty swallowing, neck pain or stiffness Respiratory: Cough, right-sided chest pain, shortness of breath with exertion denies wheezing Cardiovascular: Right-sided chest pain, denies palpitations, has chronic leg swelling  Gastrointestinal: Denies nausea, vomiting, abdominal pain, diarrhea, constipation, blood in stool and abdominal distention.  Genitourinary: Denies dysuria, urgency, frequency, hematuria, flank pain and difficulty urinating.  Endocrine: Denies: hot or cold intolerance, , polyuria, polydipsia. Musculoskeletal: Denies myalgias, back pain, joint swelling, arthralgias and gait problem.  Skin: Denies pallor, rash and wound.  Neurological: Denies dizziness, seizures, syncope, weakness, light-headedness, numbness and headaches.  Hematological: Denies adenopathy.  Psychiatric/Behavioral: Denies confusion Past Medical History  Diagnosis Date  . Crohn's disease (HCC)   . GERD (gastroesophageal reflux disease)   . Anxiety   . 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 PICC (peripherally inserted central catheter) in place     right arm  . Osteomyelitis of hand, left, acute (HCC)   . Hearing loss   . Pancreatitis 10/2014  . History of kidney stones   . History of pancreatitis 11/06/2014  . Mycobacterial infection     left hand/wrist  . Thin skin   . Septic arthritis of wrist, left [facility])    Past Surgical History  Procedure Laterality Date  . Colonoscopy    . Appendectomy  ~ 2009  . Ileostomy closure N/A 10/23/2013    Procedure: ILEOSTOMY TAKEDOWN, REPAIR OF PARASTOMAL HERNIA.;  Surgeon: Cherylynn Ridges, MD;  Location: [facility] OR;  Service: General;  Laterality: N/A;  . Wrist arthroscopy with debridement Left 05/24/2014    Procedure: LEFT ARTHROSCOPY  WRIST CULTURE/BIOPSY DEBRIDEMENT;  Surgeon: Cindee Salt, MD;  Location: [facility];  Service: Orthopedics;  Laterality: Left;  . Esophagogastroduodenoscopy N/A 06/15/2014    Procedure: ESOPHAGOGASTRODUODENOSCOPY (EGD);  Surgeon: Malissa Hippo, MD;  Location: [facility];  Service: Endoscopy;  Laterality: N/A;  . Cholecystectomy  ~ 2008  . Parastomal hernia repair  10/2013  . Incision and drainage abscess Left 06/22/2014    Procedure: INCISION, DEBRIDEMENT AND IRRIGATION LEFT HAND/WRIST;  Surgeon: Cindee Salt, MD;  Location: [facility];  Service: Orthopedics;  Laterality: Left;  . Incision and drainage of wound Left 07/27/2014    Procedure: IRRIGATION AND DEBRIDEMENT WOUND;  Surgeon: Cindee Salt, MD;  Location: [facility];  Service: Orthopedics;  Laterality: Left;  . Incision and drainage abscess Left 08/09/2014    Procedure: INCISION AND DRAINAGE LEFT WRIST;  Surgeon: Cindee Salt, MD;  Location: [facility];  Service: Orthopedics;  Laterality: Left;  . Incision and drainage abscess Left 09/05/2014    Procedure: INCISION AND DRAINAGE LEFT HAND;  Surgeon: Cindee Salt, MD;  Location: [facility];  Service: Orthopedics;  Laterality: Left;  . I&d extremity Left 10/02/2014    Procedure: IRRIGATION AND DEBRIDEMENT  LEFT HAND;  Surgeon: Cindee Salt, MD;  Location: [facility];  Service: Orthopedics;  Laterality: Left;  . I&d extremity Left 10/12/2014    Procedure: MINOR IRRIGATION AND DEBRIDEMENT LEFT HAND;  Surgeon: Cindee Salt, MD;  Location: [facility];  Service: Orthopedics;  Laterality: Left;  . Incision and drainage abscess Left 11/12/2014    Procedure: INCISION AND DRAINAGE ABSCESS LEFT HAND;  Surgeon: Cindee Salt, MD;  Location: [facility];  Service: Orthopedics;  Laterality: Left;  . Incision and drainage Left 11/27/2014    Procedure: INCISION AND DRAINAGE LEFT HAND;  Surgeon: Cindee Salt, MD;   Location: [facility];  Service: Orthopedics;  Laterality: Left;  . Tonsillectomy  1963  . Colon surgery  ~ 2010    for a colonic stricture at the anastomosis from a colostomy reversal/notes 10/03/2013  . Parastomal hernia repair  11/02/2013  . Orif distal radius fracture Right 06/29/2006  . Ileostomy    . Ileoscopy  07/02/2010  . I&d extremity Left 01/01/2015    Procedure: IRRIGATION AND DEBRIDEMENT LEFT HAND/WRIST;  Surgeon: Cindee Salt, MD;  Location: [facility];  Service: Orthopedics;  Laterality: Left;   Social History:  reports that he quit smoking about 8 years ago. He has never used smokeless tobacco. He reports that he drinks alcohol. He reports that he does not use illicit drugs.  Allergies  Allergen Reactions  . Amikacin Other (See Comments)    CAUSED DEAFNESS  . Cefoxitin Nausea Only    ABD. PAIN  . Fish Allergy Nausea And Vomiting  . Iodine Nausea Only    JOINT PAIN  . Ivp Dye [Iodinated Diagnostic Agents] Nausea Only    JOINT PAIN  . Rifampin Nausea And Vomiting and Other (See Comments)    CHILLS  . Shellfish Allergy Swelling  . Vancomycin Other (See Comments)    Red Man Syndrome - was told that it was given to fast.  . Demerol Rash    History reviewed. No pertinent family history.  Prior to Admission medications   Medication Sig Start Date End Date Taking? Authorizing Provider  ALPRAZolam Prudy Feeler) 0.5 MG  tablet TAKE 1 TABLET BY MOUTH TWICE A DAY AS NEEDED FOR ANXIETY 03/25/15  Yes Malissa Hippo, MD  Ascorbic Acid (VITAMIN C) 1000 MG tablet Take 1,000 mg by mouth daily.   Yes Historical Provider, MD  azithromycin (ZITHROMAX) 600 MG tablet Take 1 tablet (600 mg total) by mouth daily. 11/09/14  Yes Ashly Hulen Skains, DO  Calcium Carbonate-Vitamin D (CALCIUM 600 + D PO) Take 1 tablet by mouth daily.    Yes Historical Provider, MD  CLOFAZIMINE PO Take 100 mg by mouth at bedtime.    Yes Historical Provider, MD  folic acid (FOLVITE) 1 MG tablet  Take 1 mg by mouth daily.   Yes Historical Provider, MD  furosemide (LASIX) 40 MG tablet TAKE 1 TABLET BY MOUTH EVERY MORNING AS NEEDED FOR SWELLING 03/14/15  Yes Historical Provider, MD  HYDROcodone-acetaminophen (NORCO) 10-325 MG tablet Take 1 tablet by mouth every 6 (six) hours as needed. 03/25/15  Yes Malissa Hippo, MD  loperamide (IMODIUM A-D) 2 MG tablet Take 8 mg by mouth 2 (two) times daily as needed for diarrhea or loose stools.   Yes Historical Provider, MD  Multiple Vitamins-Minerals (MULTIVITAMIN PO) Take 1 tablet by mouth daily.    Yes Historical Provider, MD  pantoprazole (PROTONIX) 40 MG tablet Take 1 tablet (40 mg total) by mouth daily before breakfast. 01/15/15  Yes Malissa Hippo, MD  predniSONE (DELTASONE) 5 MG tablet Take 2 tablets (10 mg total) by mouth daily with breakfast. 03/25/15  Yes Malissa Hippo, MD  promethazine (PHENERGAN) 25 MG tablet Take 1 tablet (25 mg total) by mouth as needed for nausea or vomiting. 12/07/14  Yes Len Blalock, NP  tigecycline 50 mg in sodium chloride 0.9 % 100 mL Inject 50 mg into the vein every 12 (twelve) hours. 11/09/14  Yes Ashly M Gottschalk, DO  XARELTO 20 MG TABS tablet Take 20 mg by mouth daily. 03/04/15  Yes Historical Provider, MD  Zinc 50 MG CAPS Take 1 capsule by mouth daily.    Yes Historical Provider, MD  diphenoxylate-atropine (LOMOTIL) 2.5-0.025 MG tablet Take 1 tablet by mouth 4 (four) times daily as needed for diarrhea or loose stools. Patient not taking: Reported on 05/09/2015 03/25/15   Malissa Hippo, MD  nystatin (MYCOSTATIN/NYSTOP) 100000 UNIT/GM POWD Apply topically daily as needed (FOR IRRITATION). PRN 03/13/14   Historical Provider, MD     Physical Exam:  Filed Vitals:   05/09/15 0915 05/09/15 0945 05/09/15 1200 05/09/15 1230  BP: 121/81 134/73 124/80 133/75  Pulse: 88 83 87 94  Resp: 15 16 18 21   SpO2: 93% 94% 94% 93%    Constitutional: Vital signs reviewed.  Elderly male not in distress. Hearing loss  + HEENT: no pallor, no icterus, moist oral mucosa, no cervical lymphadenopathy Cardiovascular: RRR, S1 normal, S2 normal, no MRG Chest: CTAB, no wheezes, rales, or rhonchi Abdominal: Soft. Nondistended, ileostomy bag in place with red colored output ( as per wife this is associated with clofazimine) Non-tender,, bowel sounds are normal, Ext: warm, no edema Neurological: A&O x3, non focal  Labs on Admission:  Basic Metabolic Panel:  Recent Labs Lab 05/09/15 0915  NA 141  K 4.0  CL 114*  CO2 20*  GLUCOSE 62*  BUN 18  CREATININE 1.21  CALCIUM 8.1*   Liver Function Tests:  Recent Labs Lab 05/09/15 0915  AST 49*  ALT 41  ALKPHOS 303*  BILITOT 1.5*  PROT 4.1*  ALBUMIN 1.7*    Recent  Labs Lab 05/09/15 0915  LIPASE 27   No results for input(s): AMMONIA in the last 168 hours. CBC:  Recent Labs Lab 05/09/15 0915  WBC 14.4*  HGB 12.0*  HCT 35.9*  MCV 99.4  PLT 127*   Cardiac Enzymes: No results for input(s): CKTOTAL, CKMB, CKMBINDEX, TROPONINI in the last 168 hours. BNP: Invalid input(s): POCBNP CBG: No results for input(s): GLUCAP in the last 168 hours.  Radiological Exams on Admission: Dg Chest 2 View  05/09/2015  CLINICAL DATA:  Chest pain, history of pancreatitis EXAM: CHEST  2 VIEW COMPARISON:  11/07/2014 FINDINGS: Cardiomediastinal silhouette is stable. Right arm PICC line with tip in SVC right atrium junction. There is no pneumothorax. There is streaky interstitial infiltrate in right middle lobe and left perihilar. Degenerative changes bilateral shoulders. No pleural effusion. IMPRESSION: No pulmonary edema. Streaky interstitial infiltrate right middle lobe and left perihilar. Right PICC line is unchanged in position. Degenerative changes bilateral shoulders. Electronically Signed   By: Natasha Mead M.D.   On: 05/09/2015 09:42   Dg Abd 1 View  05/09/2015  CLINICAL DATA:  Epigastric and umbilical pain for 2 days. EXAM: ABDOMEN - 1 VIEW COMPARISON:  CT  11/06/2014 FINDINGS: Surgical clips in the right upper quadrant and left upper quadrant. Nonobstructive bowel gas pattern. Ostomy noted in the right lower quadrant. No free air organomegaly. No suspicious calcification. IMPRESSION: Right lower quadrant ostomy. No evidence of obstruction or free air. Electronically Signed   By: Charlett Nose M.D.   On: 05/09/2015 11:44    EKG: Sinus rhythm at 98 with PVCs, no ST-T changes  Assessment/Plan Principal Problem:   Community acquired pneumonia Admit to telemetry. Given his comorbidities and aggressive  current antibiotic use and bilateral lobe involvement I will treat this as healthcare associated pneumonia. I will start him on empiric Zosyn. Patient has multiple antibiotic allergies including vancomycin. Follow blood culture, sputum culture, urine strep and Legionella antigen and HIV antibody. -Supportive care with oxygen, Tylenol and antitussives.  Active Problems: Chest pain Appears to be pleuritic associated with underlying pneumonia. EKG and initial troponin was negative. Will not work him up further.  DVT left leg Age indeterminate left common femoral DVT seen on Doppler. As per wife he was diagnosed with DVT on the same leg in October of this year and has been on Xarelto since then. Given his low O2 sat in the ED the ED physician has ordered for a VQ scan to rule out for a PE (patient allergy to IV contrast). I have low suspicion for PE. Will follow-up on the scan results.    Crohn's disease (HCC) Ileostomy status. Follows with Dr. Karilyn Cota.  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 Sees Dr. Algis Liming and hand surgeon Dr. [name].   Bilateral leg edema with severe protein calorie malnutrition Ordered nutrition consult.  Chronic adrenal insufficiency    Consults Discussed plan with Dr. Algis Liming on the phone in  detail. Agreed with initiation of antibiotic to cover for HCAP and resume his home antibiotics. Also patient has not received any flu vaccination decision. Wife informs she was not clear why he did not receive the vaccination. Clarified with Dr. Algis Liming there is no contraindication to receiving vaccine. He should get a flu shot prior to discharge.      Diet:regular  DVT prophylaxis: on xarelto   Code Status: full code Family Communication: discussed with wife  at bedside Disposition  Plan: admit to tele. Home possibly in 1-2 days  Jacara Benito [facility] Pager [phone]  Total time spent on admission :70 minutes  If 7PM-7AM, please contact night-coverage [email or web address] Password [facility] 05/09/2015, 1:37 PM

## 2015-05-09 NOTE — ED Notes (Signed)
Ordered pt a heart healthy meal tray. Gave room number that their going to upstairs, to dietian.

## 2015-05-09 NOTE — ED Notes (Signed)
Pt returned to room from xray.

## 2015-05-10 DIAGNOSIS — R079 Chest pain, unspecified: Secondary | ICD-10-CM

## 2015-05-10 LAB — URINALYSIS, ROUTINE W REFLEX MICROSCOPIC
Bilirubin Urine: NEGATIVE
Glucose, UA: NEGATIVE mg/dL
Hgb urine dipstick: NEGATIVE
KETONES UR: 15 mg/dL — AB
Nitrite: NEGATIVE
PROTEIN: NEGATIVE mg/dL
Specific Gravity, Urine: 1.031 — ABNORMAL HIGH (ref 1.005–1.030)
pH: 5 (ref 5.0–8.0)

## 2015-05-10 LAB — URINE MICROSCOPIC-ADD ON: RBC / HPF: NONE SEEN RBC/hpf (ref 0–5)

## 2015-05-10 LAB — STREP PNEUMONIAE URINARY ANTIGEN: STREP PNEUMO URINARY ANTIGEN: NEGATIVE

## 2015-05-10 LAB — HIV ANTIBODY (ROUTINE TESTING W REFLEX): HIV SCREEN 4TH GENERATION: NONREACTIVE

## 2015-05-10 MED ORDER — ENSURE ENLIVE PO LIQD
237.0000 mL | Freq: Three times a day (TID) | ORAL | Status: DC
  Administered 2015-05-10 – 2015-05-13 (×7): 237 mL via ORAL

## 2015-05-10 MED ORDER — PANTOPRAZOLE SODIUM 40 MG PO TBEC
40.0000 mg | DELAYED_RELEASE_TABLET | ORAL | Status: DC
  Administered 2015-05-11: 40 mg via ORAL
  Filled 2015-05-10: qty 1

## 2015-05-10 MED ORDER — INFLUENZA VAC SPLIT QUAD 0.5 ML IM SUSY
0.5000 mL | PREFILLED_SYRINGE | INTRAMUSCULAR | Status: AC
Start: 2015-05-11 — End: 2015-05-11
  Administered 2015-05-11: 0.5 mL via INTRAMUSCULAR
  Filled 2015-05-10: qty 0.5

## 2015-05-10 MED ORDER — AZITHROMYCIN 600 MG PO TABS
600.0000 mg | ORAL_TABLET | Freq: Every day | ORAL | Status: DC
  Administered 2015-05-11 – 2015-05-13 (×3): 600 mg via ORAL
  Filled 2015-05-10 (×4): qty 1

## 2015-05-10 MED ORDER — ENSURE ENLIVE PO LIQD: 237.0000 mL | ORAL | Status: DC | PRN

## 2015-05-10 NOTE — Progress Notes (Addendum)
[facility]                                                                              Patient Demographics  Lee Klein, is a 64 y.o. male, DOB - [birth year], GNF:[id number]  Admit date - 05/09/2015   Admitting Physician Eddie North, MD  Outpatient Primary MD for the patient is [name], MD  LOS - 1   Chief Complaint  Patient presents with  . 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 He is currently on 3 different antibiotics (tigecycline, azithromycin and clofazimine) and sees Dr. Zenaida Niece dam. He was also diagnosed to have left leg DVT in October by his primary and has been on Xarelto since then. For the past 2-3 days prior to admission, patient had been short of breath on exertion and has right-sided chest discomfort with deep inspiration and some movement, productive phlegm, chills. Chest x-ray showed right middle lobe and left perihilar infiltrates.   Assessment & Plan    Principal Problem: Healthcare associated pneumonia - Given comorbidities and current antibiotic use, patient qualifies for HCAP , continue IV Zosyn. Patient has multiple antibiotic allergies including vancomycin. Continue tigecycline, clofazimine - Urine strep antigen negative, blood cultures negative so far. Follow sputum culture, urine Legionella antigen and HIV antibody. -Supportive care with oxygen, Tylenol and antitussives. - VQ scan low probability for PE - Dr Gonzella Lex discussed with ID, Dr. Algis Liming who agreed with initiation of antibiotics to cover for HCAP and resume home antibiotics, flu shot prior to discharge.  Active  Problems: Chest pain, atypical pleuritic Appears to be pleuritic associated with underlying pneumonia. EKG and initial troponin was negative. Will not work him up further.  DVT left leg - Age indeterminate left common femoral DVT seen on Doppler. As per wife he was diagnosed with DVT on the same leg in October of this year and has been on Xarelto since then.  - Given hypoxia, VQ scan was done, which showed low probability to PE  - Continue xarelto   Crohn's disease (HCC) Ileostomy status. Follows with Dr. Karilyn Cota.  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 Sees Dr. Algis Liming and hand surgeon Dr. [name].  Bilateral leg edema with severe protein calorie malnutrition Ordered nutrition consult.  Chronic adrenal insufficiency - Currently stable, BP stable  Code Status: Full CODE STATUS  Family Communication: Discussed in detail with the patient, all imaging results, lab results explained to the patient, updated patient's wife   Disposition Plan:   Time Spent in minutes  25 minutes  Procedures  VQ scan CXR  Consults   ID  DVT Prophylaxis xarelto  Medications  Scheduled Meds: . azithromycin  600 mg Oral Daily  . Investigational - Study Medication  100 mg Oral  QHS  . feeding supplement (ENSURE ENLIVE)  237 mL Oral BID BM  . folic acid  1 mg Oral Daily  . multivitamin with minerals  1 tablet Oral Daily  . pantoprazole  40 mg Oral QAC breakfast  . piperacillin-tazobactam  3.375 g Intravenous Once  . piperacillin-tazobactam (ZOSYN)  IV  3.375 g Intravenous 3 times per day  . predniSONE  10 mg Oral Q breakfast  . rivaroxaban  20 mg Oral QPC supper  . tigecycline (TYGACIL) IVPB  50 mg Intravenous Q12H  . vitamin C  1,000 mg Oral Daily  . zinc sulfate  220 mg Oral Daily   Continuous Infusions: . sodium chloride 75 mL/hr at 05/09/15 1830   PRN  Meds:.ALPRAZolam, diphenoxylate-atropine, guaiFENesin-dextromethorphan, HYDROcodone-acetaminophen, promethazine, sodium chloride, technetium TC 34M diethylenetriame-pentaacetic acid   Antibiotics   Anti-infectives    Start     Dose/Rate Route Frequency Ordered Stop   05/09/15 2200  tigecycline (TYGACIL) 50 mg in sodium chloride 0.9 % 100 mL IVPB     50 mg 200 mL/hr over 30 Minutes Intravenous Every 12 hours 05/09/15 1540     05/09/15 2030  piperacillin-tazobactam (ZOSYN) IVPB 3.375 g     3.375 g 12.5 mL/hr over 240 Minutes Intravenous 3 times per day 05/09/15 1419     05/09/15 1800  azithromycin (ZITHROMAX) tablet 600 mg     600 mg Oral Daily 05/09/15 1540     05/09/15 1415  piperacillin-tazobactam (ZOSYN) IVPB 3.375 g     3.375 g 100 mL/hr over 30 Minutes Intravenous  Once 05/09/15 1404     05/09/15 1230  levofloxacin (LEVAQUIN) IVPB 750 mg  Status:  Discontinued     750 mg 100 mL/hr over 90 Minutes Intravenous  Once 05/09/15 1215 05/09/15 1219        Subjective:   Kasir Hallenbeck was seen and examined today. Low-grade temp, chest pain improved, no nausea, vomiting, diarrhea. Patient denies dizziness, abdominal pain, new weakness, numbess, tingling. No acute events overnight.    Objective:   Blood pressure 141/66, pulse 105, temperature 99.5 F (37.5 C), temperature source Oral, resp. rate 18, height 6' (1.829 m), weight 69.037 kg (152 lb 3.2 oz), SpO2 92 %.  Wt Readings from Last 3 Encounters:  05/09/15 69.037 kg (152 lb 3.2 oz)  03/25/15 73.029 kg (161 lb)  01/15/15 72.394 kg (159 lb 9.6 oz)     Intake/Output Summary (Last 24 hours) at 05/10/15 0958 Last data filed at 05/10/15 0544  Gross per 24 hour  Intake 1642.5 ml  Output    400 ml  Net 1242.5 ml    Exam  General: Alert and oriented , NAD, hearing loss +  HEENT:  PERRLA, EOMI, Anicteric Sclera, mucous membranes moist.   Neck: Supple, no JVD, no masses  CVS: S1 S2 auscultated, no rubs, murmurs or gallops.  Regular rate and rhythm.  Respiratory:  Decreased breath sounds at the bases  Abdomen: Soft, nontender, nondistended, + bowel sounds, ileostomy +  Ext: no cyanosis clubbing or edema  Neuro: AAOx3, Cr N's II- XII. Strength 5/5 upper and lower extremities bilaterally  Skin: No rashes  Psych: Normal affect and demeanor, alert and oriented    Data Review   Micro Results No results found for this or any previous visit (from the past 240 hour(s)).  Radiology Reports Dg Chest 2 View  05/09/2015  CLINICAL DATA:  Chest pain, history of pancreatitis EXAM: CHEST  2 VIEW COMPARISON:  11/07/2014 FINDINGS: Cardiomediastinal silhouette is stable.  Right arm PICC line with tip in SVC right atrium junction. There is no pneumothorax. There is streaky interstitial infiltrate in right middle lobe and left perihilar. Degenerative changes bilateral shoulders. No pleural effusion. IMPRESSION: No pulmonary edema. Streaky interstitial infiltrate right middle lobe and left perihilar. Right PICC line is unchanged in position. Degenerative changes bilateral shoulders. Electronically Signed   By: Natasha Mead M.D.   On: 05/09/2015 09:42   Dg Abd 1 View  05/09/2015  CLINICAL DATA:  Epigastric and umbilical pain for 2 days. EXAM: ABDOMEN - 1 VIEW COMPARISON:  CT 11/06/2014 FINDINGS: Surgical clips in the right upper quadrant and left upper quadrant. Nonobstructive bowel gas pattern. Ostomy noted in the right lower quadrant. No free air organomegaly. No suspicious calcification. IMPRESSION: Right lower quadrant ostomy. No evidence of obstruction or free air. Electronically Signed   By: Charlett Nose M.D.   On: 05/09/2015 11:44   Nm Pulmonary Perf And Vent  05/09/2015  CLINICAL DATA:  Shortness of breath, history of DVT EXAM: NUCLEAR MEDICINE VENTILATION - PERFUSION LUNG SCAN TECHNIQUE: Ventilation images were obtained in multiple projections using inhaled aerosol Tc-43m DTPA. Perfusion images were obtained in multiple  projections after intravenous injection of Tc-41m MAA. RADIOPHARMACEUTICALS:  31.0 millicuries E Technetium-39m DTPA aerosol inhalation and 4.1 millicuries Technetium-71m MAA IV COMPARISON:  VQ scan of 09/10/2014 and chest x-ray 05/09/2015 FINDINGS: Ventilation: No segmental ventilation defects are noted. Please note there is clumping of the tracer with central airways deposition. Perfusion: No wedge shaped peripheral perfusion defects to suggest acute pulmonary embolism. Chest x-ray shows streaky infiltrate in right middle lobe and left perihilar suspicious for interstitial pneumonitis or atypical pneumonia. Findings are low probability for pulmonary embolus. IMPRESSION: Low probability for pulmonary embolus. Electronically Signed   By: Natasha Mead M.D.   On: 05/09/2015 15:11    CBC  Recent Labs Lab 05/09/15 0915  WBC 14.4*  HGB 12.0*  HCT 35.9*  PLT 127*  MCV 99.4  MCH 33.2  MCHC 33.4  RDW 16.0*    Chemistries   Recent Labs Lab 05/09/15 0915  NA 141  K 4.0  CL 114*  CO2 20*  GLUCOSE 62*  BUN 18  CREATININE 1.21  CALCIUM 8.1*  AST 49*  ALT 41  ALKPHOS 303*  BILITOT 1.5*   ------------------------------------------------------------------------------------------------------------------ estimated creatinine clearance is 61.8 mL/min (by C-G formula based on Cr of 1.21). ------------------------------------------------------------------------------------------------------------------ No results for input(s): HGBA1C in the last 72 hours. ------------------------------------------------------------------------------------------------------------------ No results for input(s): CHOL, HDL, LDLCALC, TRIG, CHOLHDL, LDLDIRECT in the last 72 hours. ------------------------------------------------------------------------------------------------------------------ No results for input(s): TSH, T4TOTAL, T3FREE, THYROIDAB in the last 72 hours.  Invalid input(s):  FREET3 ------------------------------------------------------------------------------------------------------------------ No results for input(s): VITAMINB12, FOLATE, FERRITIN, TIBC, IRON, RETICCTPCT in the last 72 hours.  Coagulation profile No results for input(s): INR, PROTIME in the last 168 hours.  No results for input(s): DDIMER in the last 72 hours.  Cardiac Enzymes No results for input(s): CKMB, TROPONINI, MYOGLOBIN in the last 168 hours.  Invalid input(s): CK ------------------------------------------------------------------------------------------------------------------ Invalid input(s): POCBNP  No results for input(s): GLUCAP in the last 72 hours.   RAI,RIPUDEEP M.D. [facility] 05/10/2015, 9:58 AM  Pager: [phone] Between 7am to 7pm - call Pager - [phone]  After 7pm go to [email or web address] - password [email or web address]  Call night coverage person covering after 7pm

## 2015-05-10 NOTE — Care Management Note (Signed)
Case Management Note  Patient Details  Name: ARLEE SILVESTRO MRN: [id number] Date of Birth: December 24, 1950  Subjective/Objective:    Pt admitted for chest pain. Positive Blood Cultures. Initiated on IV Zosyn.                 Action/Plan: Pt currently active with [facility] RN for IV ABX therapy. Pt will need resumption orders at time of d/c. CM will continue to monitor.    Expected Discharge Date:                  Expected Discharge Plan:  Home w Home Health Services  In-House Referral:  NA  Discharge planning Services  CM Consult  Post Acute Care Choice:  Home Health, Resumption of Svcs/PTA Provider Choice offered to:  Patient  DME Arranged:    DME Agency:     HH Arranged:  RN HH Agency:  [facility]  Status of Service:  In process, will continue to follow  Medicare Important Message Given:    Date Medicare IM Given:    Medicare IM give by:    Date Additional Medicare IM Given:    Additional Medicare Important Message give by:     If discussed at Long Length of Stay Meetings, dates discussed:    Additional Comments:  Gala Lewandowsky, RN 05/10/2015, 4:34 PM

## 2015-05-10 NOTE — Evaluation (Addendum)
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 Chest x-ray showed right middle lobe and left perihilar infiltrates. Admitted for Healthcare associated pneumonia.  Clinical Impression  Pt admitted with above diagnosis. Pt currently with functional limitations due to the deficits listed below (see PT Problem List). Demonstrates instability with gait but able to self correct without physical assist. SpO2 ranged 79-85% on 3L supplemental O2, quickly improves to 99% with seated rest and 4L applied. Resting SpO2 94% on 3L. Mild dyspnea with exertion. Reports balance difficulties that occurred simultaneously with hearing loss. Very likely vestibular damage from ototoxicity which can be addressed with outpatient vestibular rehab. Good family support from wife. Pt was very independent prior to admission.  Pt will benefit from skilled PT to increase their independence and safety with mobility to allow discharge to the venue listed below.       Follow Up Recommendations Outpatient PT (Neuro rehab - balance loss associated with hearing loss)    Equipment Recommendations  None recommended by PT    Recommendations for Other Services       Precautions / Restrictions Precautions Precautions: Fall Precaution Comments: monitor O2  Restrictions Weight Bearing Restrictions: No      Mobility  Bed Mobility Overal bed mobility: Independent                Transfers Overall transfer level: Independent               General  transfer comment: extra time. min sway but able to self correct.  Ambulation/Gait Ambulation/Gait assistance: Min guard Ambulation Distance (Feet): 100 Feet Assistive device: None Gait Pattern/deviations: Step-through pattern;Decreased stride length;Staggering left;Staggering right Gait velocity: decreased Gait velocity interpretation: Below normal speed for age/gender General Gait Details: Slightly guarded. occasional stagger to LT/RT but able to self correct. SpO2 down to 79-85% with poor waveform, mild dyspnea on 3L supplemental O2. Occasionally touching rail in hallway for support. Cues for pursed lip breathing and awareness of instability. HR up to 120s  Stairs            Wheelchair Mobility    Modified Rankin (Stroke Patients Only)       Balance Overall balance assessment: Needs assistance Sitting-balance support: No upper extremity supported;Feet supported Sitting balance-Leahy Scale: Normal     Standing balance support: No upper extremity supported Standing balance-Leahy Scale: Good                               Pertinent Vitals/Pain Pain Assessment: No/denies pain    Home Living Family/patient expects to be discharged to:: Private residence Living Arrangements: Spouse/significant other Available Help at Discharge: Family;Available PRN/intermittently Type of Home: House Home Access: Stairs to enter Entrance Stairs-Rails: Doctor, general practice of Steps: 2 Home Layout: One level Home Equipment: Shower seat - built in;Grab bars - tub/shower      Prior Function Level of Independence: Independent         Comments: Hx of balance issues after losing hearing but independent.     Hand Dominance  Extremity/Trunk Assessment   Upper Extremity Assessment: Defer to OT evaluation           Lower Extremity Assessment: Generalized weakness         Communication   Communication: Deaf  Cognition Arousal/Alertness:  Awake/alert Behavior During Therapy: WFL for tasks assessed/performed Overall Cognitive Status: Within Functional Limits for tasks assessed                      General Comments General comments (skin integrity, edema, etc.): Spent time discussing d/c needs for safe mobility in the home. Wife very interested in OP vestibular/neuro rehab for pt due to balance loss following his hearing loss. SpO2 at rest 94% on 3L supplemental O2. Ambulating on 3L SpO2 79-85%,  with mild dyspnea, improved to 98% after sitting on 4L supplemental O2.    Exercises        Assessment/Plan    PT Assessment Patient needs continued PT services  PT Diagnosis Difficulty walking;Abnormality of gait   PT Problem List Decreased activity tolerance;Decreased balance;Decreased mobility;Decreased knowledge of use of DME;Cardiopulmonary status limiting activity  PT Treatment Interventions DME instruction;Gait training;Stair training;Functional mobility training;Therapeutic activities;Therapeutic exercise;Balance training;Patient/family education   PT Goals (Current goals can be found in the Care Plan section) Acute Rehab PT Goals Patient Stated Goal: Get better PT Goal Formulation: With patient Time For Goal Achievement: 05/24/15 Potential to Achieve Goals: Good    Frequency Min 3X/week   Barriers to discharge        Co-evaluation               End of Session Equipment Utilized During Treatment: Oxygen Activity Tolerance: Patient tolerated treatment well Patient left: in bed;with call bell/phone within reach;with family/visitor present Nurse Communication: Mobility status (sats)         Time: 5329-9242 PT Time Calculation (min) (ACUTE ONLY): 31 min   Charges:   PT Evaluation $Initial PT Evaluation Tier I: 1 Procedure PT Treatments $Gait Training: 8-22 mins   PT G CodesBerton Mount 05/10/2015, 3:04 PM Lee Klein,  [phone]

## 2015-05-10 NOTE — Progress Notes (Signed)
Initial Nutrition Assessment  DOCUMENTATION CODES:   Non-severe (moderate) malnutrition in context of chronic illness  INTERVENTION:    Ensure Enlive PO TID and prn, each supplement provides 350 kcal and 20 grams of protein  NUTRITION DIAGNOSIS:   Malnutrition related to chronic illness as evidenced by mild depletion of muscle mass, percent weight loss (13% weight loss within the past 6 months).  GOAL:   Patient will meet greater than or equal to 90% of their needs  MONITOR:   PO intake, Supplement acceptance, Labs, Weight trends, I & O's  REASON FOR ASSESSMENT:   Consult Assessment of nutrition requirement/status  ASSESSMENT:   64 year old male with Crohn's disease (ileostomy status), complicated osteomyelitis of left hand due to infected IV insertion site. For 2-3 days prior to admission, patient had been short of breath on exertion and has right-sided chest discomfort with deep inspiration and some movement, productive phlegm, chills. Chest x-ray showed right middle lobe and left perihilar infiltrates.   Patient and his wife report he has been eating poorly. Since his hand surgery 8 months ago, patient reports 20 lb weight loss and poor intake. Provided "ileostomy nutrition therapy" and "increasing protein and calorie intake" handouts per wife request. Patient will to drink 2-3 Ensure supplements per day, likes strawberry flavor. Wife requests it be ordered more, so patient can have it at anytime. Patient is consuming ~50% of meals.   Patient with 13% weight loss within the past 6 months, mild muscle depletion. Patient with moderate PCM.  Diet Order:  Diet Heart Room service appropriate?: Yes; Fluid consistency:: Thin  Skin:  Reviewed, no issues  Last BM:  12/23 (ileostomy)  Height:   Ht Readings from Last 1 Encounters:  05/10/15  (1.778 m)    Weight:   Wt Readings from Last 1 Encounters:  05/09/15 152 lb 3.2 oz (69.037 kg)    Ideal Body Weight:  75.5  kg  BMI:  Body mass index is 21.84 kg/(m^2).  Estimated Nutritional Needs:   Kcal:  2100-2300  Protein:  110-130 gm  Fluid:  2.1-2.3 L  EDUCATION NEEDS:   Education needs addressed  Joaquin Courts, RD, LDN, CNSC Pager [phone] After Hours Pager [phone]

## 2015-05-10 NOTE — Progress Notes (Signed)
UR Completed Nelani Schmelzle Graves-Bigelow, RN,BSN [phone]  

## 2015-05-10 NOTE — Plan of Care (Signed)
Problem: Breathing - Pneumonia Goal: Ability to maintain adequate oxygenation and ventilation will improve Outcome: Progressing Improved Respiratory status.

## 2015-05-10 NOTE — Progress Notes (Signed)
Notified by micro lab that Blood Cx done 05/09/15 are positive for Gram neg. Rods for both drawn. Dr. Isidoro Donning notified.

## 2015-05-11 DIAGNOSIS — I82519 Chronic embolism and thrombosis of unspecified femoral vein: Secondary | ICD-10-CM

## 2015-05-11 DIAGNOSIS — E2740 Unspecified adrenocortical insufficiency: Secondary | ICD-10-CM

## 2015-05-11 DIAGNOSIS — J9601 Acute respiratory failure with hypoxia: Secondary | ICD-10-CM

## 2015-05-11 DIAGNOSIS — A319 Mycobacterial infection, unspecified: Secondary | ICD-10-CM

## 2015-05-11 DIAGNOSIS — J189 Pneumonia, unspecified organism: Secondary | ICD-10-CM

## 2015-05-11 LAB — BASIC METABOLIC PANEL
ANION GAP: 6 (ref 5–15)
BUN: 15 mg/dL (ref 6–20)
CALCIUM: 7.7 mg/dL — AB (ref 8.9–10.3)
CO2: 23 mmol/L (ref 22–32)
Chloride: 110 mmol/L (ref 101–111)
Creatinine, Ser: 1.17 mg/dL (ref 0.61–1.24)
GFR calc Af Amer: >60 mL/min (ref >60)
Glucose, Bld: 100 mg/dL — ABNORMAL HIGH (ref 65–99)
POTASSIUM: 4.2 mmol/L (ref 3.5–5.1)
SODIUM: 139 mmol/L (ref 135–145)

## 2015-05-11 LAB — CBC
HCT: 33.3 % — ABNORMAL LOW (ref 39.0–52.0)
Hemoglobin: 11.1 g/dL — ABNORMAL LOW (ref 13.0–17.0)
MCH: 33 pg (ref 26.0–34.0)
MCHC: 33.3 g/dL (ref 30.0–36.0)
MCV: 99.1 fL (ref 78.0–100.0)
PLATELETS: 147 10*3/uL — AB (ref 150–400)
RBC: 3.36 MIL/uL — AB (ref 4.22–5.81)
RDW: 15.6 % — AB (ref 11.5–15.5)
WBC: 15.7 10*3/uL — AB (ref 4.0–10.5)

## 2015-05-11 MED ORDER — KETOROLAC TROMETHAMINE 30 MG/ML IJ SOLN
30.0000 mg | Freq: Four times a day (QID) | INTRAMUSCULAR | Status: DC
  Administered 2015-05-11 – 2015-05-13 (×8): 30 mg via INTRAVENOUS
  Filled 2015-05-11 (×8): qty 1

## 2015-05-11 MED ORDER — PANTOPRAZOLE SODIUM 40 MG PO TBEC
40.0000 mg | DELAYED_RELEASE_TABLET | Freq: Two times a day (BID) | ORAL | Status: DC
  Administered 2015-05-11 – 2015-05-13 (×4): 40 mg via ORAL
  Filled 2015-05-11 (×4): qty 1

## 2015-05-11 MED ORDER — SODIUM CHLORIDE 0.9 % IV BOLUS (SEPSIS)
1000.0000 mL | Freq: Once | INTRAVENOUS | Status: AC
  Administered 2015-05-11: 1000 mL via INTRAVENOUS

## 2015-05-11 MED ORDER — SODIUM CHLORIDE 0.9 % IV SOLN: INTRAVENOUS | Status: DC

## 2015-05-11 MED ORDER — LEVALBUTEROL HCL 0.63 MG/3ML IN NEBU
0.6300 mg | INHALATION_SOLUTION | Freq: Three times a day (TID) | RESPIRATORY_TRACT | Status: DC | PRN
  Administered 2015-05-11 – 2015-05-12 (×2): 0.63 mg via RESPIRATORY_TRACT
  Filled 2015-05-11: qty 3

## 2015-05-11 MED ORDER — DM-GUAIFENESIN ER 30-600 MG PO TB12
1.0000 | ORAL_TABLET | Freq: Two times a day (BID) | ORAL | Status: DC
  Administered 2015-05-11 – 2015-05-13 (×5): 1 via ORAL
  Filled 2015-05-11 (×5): qty 1

## 2015-05-11 NOTE — Consult Note (Addendum)
Pharmacy Antibiotic Follow-up Note  DOVID BARTKO is a 64 y.o. year-old male admitted on 05/09/2015.  The patient is currently on day 3 of Zosyn for pneumonia.  Assessment/Plan: Continue Zosyn 3.375G IV q8h (4hr infusion) Will continue to follow renal function, culture results, LOT, and antibiotic de-escalation plans   Temp (24hrs), Avg:99 F (37.2 C), Min:98.5 F (36.9 C), Max:99.6 F (37.6 C)   Recent Labs Lab 05/09/15 0915 05/11/15 0543  WBC 14.4* 15.7*    Recent Labs Lab 05/09/15 0915 05/11/15 0543  CREATININE 1.21 1.17   Estimated Creatinine Clearance: 64.6 mL/min (by C-G formula based on Cr of 1.17).    Allergies  Allergen Reactions  . Amikacin Other (See Comments)    CAUSED DEAFNESS  . Cefoxitin Nausea Only    ABD. PAIN  . Fish Allergy Nausea And Vomiting  . Iodine Nausea Only    JOINT PAIN  . Ivp Dye [Iodinated Diagnostic Agents] Nausea Only    JOINT PAIN  . Rifampin Nausea And Vomiting and Other (See Comments)    CHILLS  . Shellfish Allergy Swelling  . Vancomycin Other (See Comments)    Red Man Syndrome - was told that it was given to fast.  . Demerol Rash    Antimicrobials this admission: PTA Azithromycin, tigecycline 12/22 Zosyn-->  Levels/dose changes this admission: n/a  Microbiology results: 12/22 Blood cx x 2 sent  Thank you for allowing pharmacy to be a part of this patient's care.  Sandi Carne, PharmD Pharmacy Resident Pager: [phone] 05/11/2015 7:54 AM

## 2015-05-11 NOTE — Progress Notes (Signed)
[facility] Progress Note   Lee Klein  ZOX:[id number]  DOB: November 30, 1950  DOA: 05/09/2015 PCP: [name], MD  Brief narrative: Lee Klein is a 64 y.o. 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 He is found to have a right middle lobe and left perihilar infiltrate and admitted for treatment of pneumonia.   Subjective: Continues to have chest pain when he coughs and takes a deep breath. Very short of breath when he tries to walk. Cough is beginning to be productive.  Assessment/Plan: Principal Problem:   Bilateral pneumonia--Acute respiratory failure with hypoxia -neuritic chest pain - Add nebulizer treatments when necessary, flutter valve, dextromethorphan/guaifenesin and Toradol for pleuritic chest pain -Started on Zosyn per ID recommendations which I will continue -Currently requiring 4 L of oxygen which we will wean as able-not on oxygen at baseline  Active Problems:   Crohn's disease -ileostomy    Atypical mycobacterial infection/osteomyelitis of hand -Continue antibiotics which include azithromycin, clofazimine and Tygacil -Managed by ID as outpatient    Dehydration --You to hydrate with IV fluids    Chronic adrenal insufficiency  --on chronic prednisone-no need for stress dose steroids at this time    DVT, femoral, chronic  --Continue Xarelto    Protein calorie malnutrition (HCC) --Ensure  Pedal edema -TED hose have explained to wife that he should opt for these instead of using his when necessary Lasix whenever possible  Deaf   Code Status:     Code Status Orders        Start     Ordered   05/09/15 1541  Full code   Continuous      05/09/15 1540     Family Communication: Wife Disposition Plan: Home when stable DVT prophylaxis: Xarelto Consultants: Procedures:  Antibiotics: Anti-infectives    Start     Dose/Rate Route Frequency Ordered Stop   05/11/15 0800  azithromycin (ZITHROMAX) tablet 600 mg     600 mg Oral Daily with breakfast 05/10/15 1329     05/09/15 2200  tigecycline (TYGACIL) 50 mg in sodium chloride 0.9 % 100 mL IVPB     50 mg 200 mL/hr over 30 Minutes Intravenous Every 12 hours 05/09/15 1540     05/09/15 2030  piperacillin-tazobactam (ZOSYN) IVPB 3.375 g     3.375 g 12.5 mL/hr over 240 Minutes Intravenous 3 times per day 05/09/15 1419     05/09/15 1800  azithromycin (ZITHROMAX) tablet 600 mg  Status:  Discontinued     600 mg Oral Daily 05/09/15 1540 05/10/15 1329   05/09/15 1415  piperacillin-tazobactam (ZOSYN) IVPB 3.375 g     3.375 g 100 mL/hr over 30 Minutes Intravenous  Once 05/09/15 1404     05/09/15 1230  levofloxacin (LEVAQUIN) IVPB 750 mg  Status:  Discontinued     750 mg 100 mL/hr over 90 Minutes Intravenous  Once 05/09/15 1215 05/09/15 1219      Objective: Filed Weights   05/09/15 2000 05/11/15 0500  Weight: 69.037 kg (152 lb 3.2 oz) 69.763 kg (153 lb 12.8 oz)    Intake/Output Summary (Last 24 hours) at 05/11/15 1420 Last data filed at 05/11/15 0600  Gross per 24 hour  Intake   2496 ml  Output  400 ml  Net   2096 ml     Vitals Filed Vitals:   05/10/15 1100 05/10/15 1324 05/10/15 2045 05/11/15 0500  BP:  123/69 143/67 160/78  Pulse:  79 85 79  Temp:  99.6 F (37.6 C) 98.9 F (37.2 C) 98.5 F (36.9 C)  TempSrc:  Oral Oral Oral  Resp:  Height:  (1.778 m)     Weight:    69.763 kg (153 lb 12.8 oz)  SpO2:  93% 91% 92%    Exam:  General:  Pt is alert, not in acute distress  HEENT: No icterus, No thrush, oral mucosa moist  Cardiovascular: regular rate and rhythm, S1/S2 No murmur  Respiratory: clear to auscultation bilaterally   Abdomen: Soft,  +Bowel sounds, non tender, non distended, no guarding  MSK: No LE edema, cyanosis or clubbing  Data Reviewed: Basic Metabolic Panel:  Recent Labs Lab 05/09/15 0915 05/11/15 0543  NA 141 139  K 4.0 4.2  CL 114* 110  CO2 20* 23  GLUCOSE 62* 100*  BUN 18 15  CREATININE 1.21 1.17  CALCIUM 8.1* 7.7*   Liver Function Tests:  Recent Labs Lab 05/09/15 0915  AST 49*  ALT 41  ALKPHOS 303*  BILITOT 1.5*  PROT 4.1*  ALBUMIN 1.7*    Recent Labs Lab 05/09/15 0915  LIPASE 27   No results for input(s): AMMONIA in the last 168 hours. CBC:  Recent Labs Lab 05/09/15 0915 05/11/15 0543  WBC 14.4* 15.7*  HGB 12.0* 11.1*  HCT 35.9* 33.3*  MCV 99.4 99.1  PLT 127* 147*   Cardiac Enzymes: No results for input(s): CKTOTAL, CKMB, CKMBINDEX, TROPONINI in the last 168 hours. BNP (last 3 results) No results for input(s): BNP in the last 8760 hours.  ProBNP (last 3 results) No results for input(s): PROBNP in the last 8760 hours.  CBG: No results for input(s): GLUCAP in the last 168 hours.  No results found for this or any previous visit (from the past 240 hour(s)).   Studies: Nm Pulmonary Perf And Vent  05/09/2015  CLINICAL DATA:  Shortness of breath, history of DVT EXAM: NUCLEAR MEDICINE VENTILATION - PERFUSION LUNG SCAN TECHNIQUE: Ventilation images were obtained in multiple projections using inhaled aerosol Tc-48m DTPA. Perfusion images were obtained in multiple projections after intravenous injection of Tc-49m MAA. RADIOPHARMACEUTICALS:  31.0 millicuries E Technetium-39m DTPA aerosol inhalation and 4.1 millicuries Technetium-36m MAA IV COMPARISON:  VQ scan of 09/10/2014 and chest x-ray 05/09/2015 FINDINGS: Ventilation: No segmental ventilation defects are noted. Please note there is clumping of the tracer with central airways deposition. Perfusion: No wedge shaped peripheral perfusion defects to suggest acute pulmonary embolism. Chest x-ray shows streaky infiltrate in right  middle lobe and left perihilar suspicious for interstitial pneumonitis or atypical pneumonia. Findings are low probability for pulmonary embolus. IMPRESSION: Low probability for pulmonary embolus. Electronically Signed   By: Natasha Mead M.D.   On: 05/09/2015 15:11    Scheduled Meds:  Scheduled Meds: . azithromycin  600 mg Oral Q breakfast  . Investigational - Study Medication  100 mg Oral QHS  . dextromethorphan-guaiFENesin  1 tablet Oral BID  . feeding supplement (ENSURE ENLIVE)  237 mL Oral TID  . folic acid  1 mg Oral Daily  . ketorolac  30 mg Intravenous 4 times per day  . multivitamin with minerals  1 tablet Oral Daily  . pantoprazole  40 mg Oral BID  . piperacillin-tazobactam  3.375 g Intravenous Once  .  piperacillin-tazobactam (ZOSYN)  IV  3.375 g Intravenous 3 times per day  . predniSONE  10 mg Oral Q breakfast  . rivaroxaban  20 mg Oral QPC supper  . tigecycline (TYGACIL) IVPB  50 mg Intravenous Q12H  . vitamin C  1,000 mg Oral Daily  . zinc sulfate  220 mg Oral Daily   Continuous Infusions: . sodium chloride 75 mL/hr (05/10/15 2116)    Time spent on care of this patient: 35 min   Tomisha Reppucci, MD 05/11/2015, 2:20 PM  LOS: 2 days   [facility] Office  [phone] Pager - Text Page per [email or web address] If 7PM-7AM, please contact night-coverage [email or web address]

## 2015-05-12 DIAGNOSIS — E860 Dehydration: Secondary | ICD-10-CM

## 2015-05-12 DIAGNOSIS — R0781 Pleurodynia: Secondary | ICD-10-CM

## 2015-05-12 LAB — CULTURE, BLOOD (ROUTINE X 2)

## 2015-05-12 LAB — BASIC METABOLIC PANEL
Anion gap: 3 — ABNORMAL LOW (ref 5–15)
BUN: 18 mg/dL (ref 6–20)
CHLORIDE: 113 mmol/L — AB (ref 101–111)
CO2: 26 mmol/L (ref 22–32)
CREATININE: 1.21 mg/dL (ref 0.61–1.24)
Calcium: 7.5 mg/dL — ABNORMAL LOW (ref 8.9–10.3)
GFR calc non Af Amer: >60 mL/min (ref >60)
Glucose, Bld: 108 mg/dL — ABNORMAL HIGH (ref 65–99)
POTASSIUM: 4.2 mmol/L (ref 3.5–5.1)
SODIUM: 142 mmol/L (ref 135–145)

## 2015-05-12 LAB — CBC
HEMATOCRIT: 27.5 % — AB (ref 39.0–52.0)
HEMOGLOBIN: 9.6 g/dL — AB (ref 13.0–17.0)
MCH: 35.6 pg — ABNORMAL HIGH (ref 26.0–34.0)
MCHC: 34.9 g/dL (ref 30.0–36.0)
MCV: 101.9 fL — ABNORMAL HIGH (ref 78.0–100.0)
Platelets: 111 10*3/uL — ABNORMAL LOW (ref 150–400)
RBC: 2.7 MIL/uL — AB (ref 4.22–5.81)
RDW: 16.2 % — ABNORMAL HIGH (ref 11.5–15.5)
WBC: 8.9 10*3/uL (ref 4.0–10.5)

## 2015-05-12 LAB — URINE CULTURE: CULTURE: NO GROWTH

## 2015-05-12 MED ORDER — LEVALBUTEROL HCL 0.63 MG/3ML IN NEBU
0.6300 mg | INHALATION_SOLUTION | Freq: Four times a day (QID) | RESPIRATORY_TRACT | Status: DC
  Administered 2015-05-12 – 2015-05-13 (×5): 0.63 mg via RESPIRATORY_TRACT
  Filled 2015-05-12 (×5): qty 3

## 2015-05-12 MED ORDER — LEVALBUTEROL HCL 0.63 MG/3ML IN NEBU
0.6300 mg | INHALATION_SOLUTION | RESPIRATORY_TRACT | Status: DC | PRN
Start: 2015-05-12 — End: 2015-05-13
  Filled 2015-05-12 (×2): qty 3

## 2015-05-12 NOTE — Progress Notes (Signed)
[facility] Progress Note   Lee Klein  QTM:[id number]  DOB: [birth year]  DOA: 05/09/2015 PCP: [name], MD  Brief narrative: Lee Klein is a 64 y.o. 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 He is found to have a right middle lobe and left perihilar infiltrate and admitted for treatment of pneumonia.   Subjective: Chest pain is much better. Breathing treatments helping in bringing up sputum but still quite short of breath with movement.   Assessment/Plan: Principal Problem:   Bilateral pneumonia--Acute respiratory failure with hypoxia -pleuritic chest pain - cont nebulizer treatments when necessary, flutter valve, dextromethorphan/guaifenesin and Toradol for pleuritic chest pain -Started on Zosyn per ID recommendations which I will continue -Currently requiring 4 L of oxygen which we will wean as able-not on oxygen at baseline  Active Problems:   Crohn's disease -ileostomy    Atypical mycobacterial infection/osteomyelitis of hand -Continue antibiotics which include azithromycin, clofazimine and Tygacil -Managed by ID as outpatient    Dehydration --improved with IV fluids- will d/c fluids today    Chronic adrenal insufficiency  --on chronic prednisone-no need for stress dose steroids at this time    DVT, femoral, chronic  --Continue Xarelto    Protein calorie malnutrition (HCC) --Ensure  Pedal edema -TED hose have explained to wife that he should opt for these instead of using his when necessary Lasix whenever possible  Deaf   Code Status:     Code Status Orders        Start     Ordered   05/09/15 1541  Full code   Continuous      05/09/15 1540     Family Communication: Wife Disposition Plan: Home when hypoxia better DVT prophylaxis: Xarelto Consultants: Procedures:  Antibiotics: Anti-infectives    Start     Dose/Rate Route Frequency Ordered Stop   05/11/15 0800  azithromycin (ZITHROMAX) tablet 600 mg     600 mg Oral Daily with breakfast 05/10/15 1329     05/09/15 2200  tigecycline (TYGACIL) 50 mg in sodium chloride 0.9 % 100 mL IVPB     50 mg 200 mL/hr over 30 Minutes Intravenous Every 12 hours 05/09/15 1540     05/09/15 2030  piperacillin-tazobactam (ZOSYN) IVPB 3.375 g     3.375 g 12.5 mL/hr over 240 Minutes Intravenous 3 times per day 05/09/15 1419     05/09/15 1800  azithromycin (ZITHROMAX) tablet 600 mg  Status:  Discontinued     600 mg Oral Daily 05/09/15 1540 05/10/15 1329   05/09/15 1415  piperacillin-tazobactam (ZOSYN) IVPB 3.375 g     3.375 g 100 mL/hr over 30 Minutes Intravenous  Once 05/09/15 1404     05/09/15 1230  levofloxacin (LEVAQUIN) IVPB 750 mg  Status:  Discontinued     750 mg 100 mL/hr over 90 Minutes Intravenous  Once 05/09/15 1215 05/09/15 1219      Objective: Filed Weights   05/09/15 2000 05/11/15 0500 05/12/15 0440  Weight: 69.037 kg (152 lb 3.2 oz) 69.763 kg (153 lb 12.8 oz) 70.2 kg (154 lb 12.2 oz)    Intake/Output Summary (Last 24 hours) at 05/12/15 1035 Last data filed at 05/12/15 0500  Gross per 24 hour  Intake    287  ml  Output    275 ml  Net     12 ml     Vitals Filed Vitals:   05/11/15 2356 05/12/15 0000 05/12/15 0440 05/12/15 0835  BP: 158/65 169/74 150/64   Pulse: 65 72 68   Temp:  98.2 F (36.8 C) 98.3 F (36.8 C)   TempSrc:  Oral Oral   Resp: Height:      Weight:   70.2 kg (154 lb 12.2 oz)   SpO2:  96% 94% 94%    Exam:  General:  Pt is alert, not in acute distress  HEENT: No icterus, No thrush, oral mucosa moist  Cardiovascular: regular rate and rhythm, S1/S2 No murmur  Respiratory: clear to auscultation bilaterally    Abdomen: Soft, +Bowel sounds, non tender, non distended, no guarding  MSK: No LE edema, cyanosis or clubbing  Data Reviewed: Basic Metabolic Panel:  Recent Labs Lab 05/09/15 0915 05/11/15 0543 05/12/15 0500  NA 141 139 142  K 4.0 4.2 4.2  CL 114* 110 113*  CO2 20* 23 26  GLUCOSE 62* 100* 108*  BUN CREATININE 1.21 1.17 1.21  CALCIUM 8.1* 7.7* 7.5*   Liver Function Tests:  Recent Labs Lab 05/09/15 0915  AST 49*  ALT 41  ALKPHOS 303*  BILITOT 1.5*  PROT 4.1*  ALBUMIN 1.7*    Recent Labs Lab 05/09/15 0915  LIPASE 27   No results for input(s): AMMONIA in the last 168 hours. CBC:  Recent Labs Lab 05/09/15 0915 05/11/15 0543 05/12/15 0500  WBC 14.4* 15.7* 8.9  HGB 12.0* 11.1* 9.6*  HCT 35.9* 33.3* 27.5*  MCV 99.4 99.1 101.9*  PLT 127* 147* 111*   Cardiac Enzymes: No results for input(s): CKTOTAL, CKMB, CKMBINDEX, TROPONINI in the last 168 hours. BNP (last 3 results) No results for input(s): BNP in the last 8760 hours.  ProBNP (last 3 results) No results for input(s): PROBNP in the last 8760 hours.  CBG: No results for input(s): GLUCAP in the last 168 hours.  Recent Results (from the past 240 hour(s))  Culture, Urine     Status: None (Preliminary result)   Collection Time: 05/10/15  7:08 PM  Result Value Ref Range Status   Specimen Description URINE, CLEAN CATCH  Final   Special Requests Zosyn, Tigracyl, Zithromax  Final   Culture NO GROWTH < 24 HOURS  Final   Report Status PENDING  Incomplete     Studies: No results found.  Scheduled Meds:  Scheduled Meds: . azithromycin  600 mg Oral Q breakfast  . Investigational - Study Medication  100 mg Oral QHS  . dextromethorphan-guaiFENesin  1 tablet Oral BID  . feeding supplement (ENSURE ENLIVE)  237 mL Oral TID  . folic acid  1 mg Oral Daily  . ketorolac  30 mg Intravenous 4 times per day  . levalbuterol  0.63 mg Nebulization Q6H  . multivitamin with minerals  1 tablet Oral Daily   . pantoprazole  40 mg Oral BID  . piperacillin-tazobactam  3.375 g Intravenous Once  . piperacillin-tazobactam (ZOSYN)  IV  3.375 g Intravenous 3 times per day  . predniSONE  10 mg Oral Q breakfast  . rivaroxaban  20 mg Oral QPC supper  . tigecycline (TYGACIL) IVPB  50 mg Intravenous Q12H  . vitamin C  1,000 mg Oral Daily  . zinc sulfate  220 mg Oral Daily   Continuous Infusions: . sodium chloride 75 mL/hr at 05/11/15 1708  Time spent on care of this patient: 35 min   Derriona Branscom, MD 05/12/2015, 10:35 AM  LOS: 3 days   [facility] Office  [phone] Pager - Text Page per [email or web address] If 7PM-7AM, please contact night-coverage [email or web address]

## 2015-05-13 ENCOUNTER — Inpatient Hospital Stay (HOSPITAL_COMMUNITY): Payer: Managed Care, Other (non HMO)

## 2015-05-13 DIAGNOSIS — A4153 Sepsis due to Serratia: Secondary | ICD-10-CM

## 2015-05-13 LAB — BASIC METABOLIC PANEL
ANION GAP: 5 (ref 5–15)
BUN: 21 mg/dL — ABNORMAL HIGH (ref 6–20)
CALCIUM: 7.5 mg/dL — AB (ref 8.9–10.3)
CHLORIDE: 111 mmol/L (ref 101–111)
CO2: 25 mmol/L (ref 22–32)
Creatinine, Ser: 1.09 mg/dL (ref 0.61–1.24)
GFR calc non Af Amer: >60 mL/min (ref >60)
Glucose, Bld: 79 mg/dL (ref 65–99)
Potassium: 4.8 mmol/L (ref 3.5–5.1)
Sodium: 141 mmol/L (ref 135–145)

## 2015-05-13 LAB — CBC
HEMATOCRIT: 31.8 % — AB (ref 39.0–52.0)
HEMOGLOBIN: 10.2 g/dL — AB (ref 13.0–17.0)
MCH: 31.2 pg (ref 26.0–34.0)
MCHC: 32.1 g/dL (ref 30.0–36.0)
MCV: 97.2 fL (ref 78.0–100.0)
Platelets: 137 10*3/uL — ABNORMAL LOW (ref 150–400)
RBC: 3.27 MIL/uL — ABNORMAL LOW (ref 4.22–5.81)
RDW: 15.5 % (ref 11.5–15.5)
WBC: 9.7 10*3/uL (ref 4.0–10.5)

## 2015-05-13 MED ORDER — T.E.D. BELOW KNEE/XL MISC: 1.0000 | Freq: Every day | Status: DC

## 2015-05-13 MED ORDER — DM-GUAIFENESIN ER 30-600 MG PO TB12: 1.0000 | ORAL_TABLET | Freq: Two times a day (BID) | ORAL | Status: DC | PRN

## 2015-05-13 MED ORDER — LEVALBUTEROL HCL 1.25 MG/0.5ML IN NEBU: 1.2500 mg | INHALATION_SOLUTION | RESPIRATORY_TRACT | Status: AC | PRN

## 2015-05-13 MED ORDER — SULFAMETHOXAZOLE-TRIMETHOPRIM 800-160 MG PO TABS: 1.0000 | ORAL_TABLET | Freq: Two times a day (BID) | ORAL | Status: DC

## 2015-05-13 MED ORDER — CEFUROXIME AXETIL 500 MG PO TABS: 500.0000 mg | ORAL_TABLET | Freq: Two times a day (BID) | ORAL | Status: DC

## 2015-05-13 MED ORDER — TIGECYCLINE 50 MG IV SOLR: 50.0000 mg | Freq: Two times a day (BID) | INTRAVENOUS | Status: AC

## 2015-05-13 MED ORDER — ENSURE ENLIVE PO LIQD: 237.0000 mL | Freq: Three times a day (TID) | ORAL | Status: AC

## 2015-05-13 NOTE — Progress Notes (Signed)
SATURATION QUALIFICATIONS: (This note is used to comply with regulatory documentation for home oxygen)  Patient Saturations on Room Air at Rest = 87 -88%  Patient Saturations on Room Air while Ambulating = 88%  Patient Saturations on 2 Liters of oxygen while Ambulating = 93%  Please briefly explain why patient needs home oxygen: Dyspnea on Exertion, Hypoxia

## 2015-05-13 NOTE — Progress Notes (Signed)
10:15 While changing PICC drsg. and removing the "stat lock" that home health uses, Pt's skin tore and bled a small amount. Pt. Cried out in pain.  Moved PICC drsg  More to lateral arm. Covered up skin tear with Allevyn pads. Informed pt's RN.

## 2015-05-13 NOTE — Progress Notes (Signed)
Physical Therapy Treatment Patient Details Name: Lee Klein MRN: [id number] DOB: 02-19-1951 Today's Date: 05/13/2015    History of Present Illness 64 year old male with Crohn's disease (ileostomy status), complicated osteomyelitis of left hand due to infected IV insertion site with cultures growing Mycobacterium requiring dozens of debridement and prolonged course of antibiotic for almost a year. 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 Chest x-ray showed right middle lobe and left perihilar infiltrates. Admitted for Healthcare associated pneumonia.    PT Comments    Pt was seen for check of sats and pulse with activity, dropping to 90% with O2 after walking no supplemental O2.  Pulse 147 after both walks, which PT asked nursing if this wasn't the more significant finding rather than O2 sats.  Will anticipate 2L O2 for home but will need follow up therapy for balance and conditioning of debility.   Follow Up Recommendations  Outpatient PT;Other (comment) (Neuro clinic for balance)     Equipment Recommendations  None recommended by PT    Recommendations for Other Services       Precautions / Restrictions Precautions Precautions: Fall Precaution Comments: monitor O2  Restrictions Weight Bearing Restrictions: No    Mobility  Bed Mobility Overal bed mobility: Independent                Transfers Overall transfer level: Independent Equipment used:  (pt pushed his IV pole)             General transfer comment: controlled pace as PT asked to check him with no O2 for home O2 verification  Ambulation/Gait Ambulation/Gait assistance: Min guard Ambulation Distance (Feet): 150 Feet (x 2) Assistive device: None Gait Pattern/deviations: Step-through pattern;Decreased stride length;Wide base of support;Trunk flexed;Drifts right/left Gait velocity: decreased Gait velocity interpretation: Below  normal speed for age/gender General Gait Details: Measured O2 sats with pregait 97% and pulse 102 on 2L, then walked with no O2 and pulse up to 147 with sat 88%.  After adding O2 was 98% pregait pulse 109 and post gait 147 with sat 90%.  Recovered faster with 2L O2.   Stairs            Wheelchair Mobility    Modified Rankin (Stroke Patients Only)       Balance Overall balance assessment: Needs assistance Sitting-balance support: No upper extremity supported Sitting balance-Leahy Scale: Normal   Postural control: Posterior lean Standing balance support: Single extremity supported Standing balance-Leahy Scale: Good                      Cognition Arousal/Alertness: Awake/alert Behavior During Therapy: WFL for tasks assessed/performed Overall Cognitive Status: Within Functional Limits for tasks assessed                      Exercises      General Comments General comments (skin integrity, edema, etc.): Pt was noted to drop O2 sats and elevate pulse no matter what the O2 sats but pulse still elevating just as much without O2sats, SOB with all gait exertion regardless of O2 use.        Pertinent Vitals/Pain Pain Assessment: No/denies pain    Home Living                      Prior Function            PT Goals (current goals can now be found  in the care plan section) Acute Rehab PT Goals Patient Stated Goal: none stated Progress towards PT goals: Progressing toward goals    Frequency  Min 3X/week    PT Plan Current plan remains appropriate    Co-evaluation             End of Session Equipment Utilized During Treatment: Oxygen Activity Tolerance: Patient tolerated treatment well Patient left: in bed;with call bell/phone within reach;with nursing/sitter in room     Time: 1125-1152 PT Time Calculation (min) (ACUTE ONLY): 27 min  Charges:  $Gait Training: 8-22 mins $Therapeutic Activity: 8-22 mins                    G  Codes:      Ivar Drape 06-06-2015, 12:45 PM   Samul Dada, PT MS Acute Rehab Dept. Number: [phone] [id number] and [facility] [phone]

## 2015-05-13 NOTE — Care Management Note (Signed)
Case Management Note Previous CM note initiated by Tomi Bamberger RN BSN  Patient Details  Name: Lee Klein MRN: [id number] Date of Birth: 06-24-1950  Subjective/Objective:    Pt admitted for chest pain. Positive Blood Cultures. Initiated on IV Zosyn.                 Action/Plan: Pt currently active with [facility] for [facility] RN for IV ABX therapy. Pt will need resumption orders at time of d/c. CM will continue to monitor.    Expected Discharge Date:     05/13/15             Expected Discharge Plan:  Home w Home Health Services  In-House Referral:  NA  Discharge planning Services  CM Consult  Post Acute Care Choice:  Home Health, Resumption of Svcs/PTA Provider, Durable Medical Equipment Choice offered to:  Patient  DME Arranged:  Oxygen DME Agency:  [facility]  HH Arranged:  RN, IV Antibiotics HH Agency:  [facility]  Status of Service:  Completed, signed off  Medicare Important Message Given:    Date Medicare IM Given:    Medicare IM give by:    Date Additional Medicare IM Given:    Additional Medicare Important Message give by:     If discussed at Long Length of Stay Meetings, dates discussed:    Additional Comments:  05/13/15- pt for d/c home today- home 02 order written - spoke with Jermaine with [facility]- pt will qualify for home 02 - portable tank to be delivered to room prior to discharge.- pt also was active with [facility] for long term IV abx- HH resumption order placed- have spoken with [name] at [facility] regarding resumption of HH iv abx needs.   Darrold Span, RN 05/13/2015, 11:12 AM

## 2015-05-13 NOTE — Progress Notes (Signed)
D/C instructions discussed with pt and wife. Nebulizer and O2 tank for home delivered to room and sent with pt discharge. PICC line drsg.CD and I with no s/s bleed or complications at site. Prescriptions sent to pt's pharmacy and IV abx prescription given to Home Health RN with [facility] to continue IV abx therapy at home.

## 2015-05-13 NOTE — Consult Note (Signed)
Pharmacy Antibiotic Follow-up Note  Lee Klein is a 64 y.o. year-old male admitted on 05/09/2015.  The patient is currently on day 3 of Zosyn for pneumonia.  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 He has been on IV zosyn here for PNA. Will reviewing his cultures in our [facility] system, it stated that he has 2/2 blood cultures for serratia but it never crossed over into EPIC. He was about to be dc today since the MD couldn't see it. Confirm with lab and it's true that he has 2/2 serratia bacteremia. We'll hold discharge and put him back on zosyn until ID sees him.   Cont zosyn 3.375g IV q8  Ulyses Southward, PharmD Pager: [phone] 05/13/2015 12:17 PM    Temp (24hrs), Avg:97.8 F (36.6 C), Min:97.7 F (36.5 C), Max:97.8 F (36.6 C)   Recent Labs Lab 05/09/15 0915 05/11/15 0543 05/12/15 0500 05/13/15 0559  WBC 14.4* 15.7* 8.9 9.7     Recent Labs Lab 05/09/15 0915 05/11/15 0543 05/12/15 0500 05/13/15 0559  CREATININE 1.21 1.17 1.21 1.09   Estimated Creatinine Clearance: 71 mL/min (by C-G formula based on Cr of 1.09).    Allergies  Allergen Reactions  . Amikacin Other (See Comments)    CAUSED DEAFNESS  . Cefoxitin Nausea Only    ABD. PAIN  . Fish Allergy Nausea And Vomiting  . Iodine Nausea Only    JOINT PAIN  . Ivp Dye [Iodinated Diagnostic Agents] Nausea Only    JOINT PAIN  . Rifampin Nausea And Vomiting and Other (See Comments)    CHILLS  . Shellfish Allergy Swelling  . Vancomycin Other (See Comments)    Red Man Syndrome - was told that it was given to fast.  . Demerol Rash    Antimicrobials this admission: PTA Azithromycin, tigecycline 12/22 Zosyn-->  Levels/dose changes this admission: n/a  Microbiology results: 12/22 Blood cx x 2>>serratia

## 2015-05-13 NOTE — Discharge Summary (Addendum)
Physician Discharge Summary  Lee Klein ZOX:[id number] DOB: 11-08-1950 DOA: 05/09/2015  PCP: [name], MD  Admit date: 05/09/2015 Discharge date: 05/13/2015  Time spent: 50 minutes  Recommendations for Outpatient Follow-up:  1. CXR in 2 wks 2. 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 He is found to have a right middle lobe and left perihilar infiltrate and admitted for treatment of pneumonia.   Hospital Course:  Principal Problem:  Bilateral pneumonia--Acute respiratory failure with hypoxia -pleuritic chest pain- Serratia bacteremia-  - no fever or chills - culture results did not cross over into EPIC and found incidentally today by pharmacy -Started on Zosyn (was sensitive)-  d/w ID - he either has serratia pneumonia with resultant bacteremia vs a PICC infection- Dr Orvan Falconer recommends to treat with Bactrim for 2 wks and follow up at ID clinic next week - WBC count 15.7 has normalized to 9.7 - for cough, pleuritic chest pain and hypoxia, given  nebulizer treatments, flutter valve, dextromethorphan/guaifenesin and Toradol with improvement of cough and pain but remains hypoxic - will need 2 L O2 on discharge  Active Problems:  Crohn's disease - stable   Atypical mycobacterial infection/osteomyelitis of hand -Continue antibiotics which include azithromycin, clofazimine and Tygacil -Managed by ID as outpatient   Dehydration --improved with IV fluids    Chronic adrenal insufficiency  --on chronic prednisone-no need for stress dose steroids at this time   DVT, femoral, chronic  --Continue Xarelto   Protein calorie malnutrition (HCC) --Ensure ordered  Pedal edema -TED hose have explained to wife that he should opt for these instead of using his when necessary Lasix whenever possible  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 If you have any questions about your discharge medications or the care you received while you were in the hospital after you are discharged, you can call the unit and asked to speak with the hospitalist on call if the hospitalist that took care of you is not available. Once  you are discharged, your primary care physician will handle any further medical issues. Please note that NO REFILLS for any discharge medications will be authorized once you are discharged, as it is imperative that you return to your primary care physician (or establish a relationship with a  primary care physician if you do not have one) for your aftercare needs so that they can reassess your need for medications and monitor your lab values.      Discharge Instructions    Diet - low sodium heart healthy    Complete by:  As directed      Discharge instructions    Complete by:  As directed   Take Ibuprofen or Naprosyn over the counter if chest pain recurs. You need to see your family doctor in 2 wks and have a CXR done and pulse ox on room air checked.  Use TEDS for swollen ankles. Follow up in ID clinic next week     Increase activity slowly    Complete by:  As directed             Medication List    TAKE these medications        ALPRAZolam 0.5 MG tablet  Commonly known as:  XANAX  TAKE 1 TABLET BY MOUTH TWICE A DAY AS NEEDED FOR ANXIETY     azithromycin 600 MG tablet  Commonly known as:  ZITHROMAX  Take 1 tablet (600 mg total) by mouth daily.     CALCIUM 600 + D PO  Take 1 tablet by mouth daily.     CLOFAZIMINE PO  Take 100 mg by mouth at bedtime.     dextromethorphan-guaiFENesin 30-600 MG 12hr tablet  Commonly known as:  MUCINEX DM  Take 1 tablet by mouth 2 (two) times daily as needed for cough (congestion).     diphenoxylate-atropine 2.5-0.025 MG tablet  Commonly known as:  LOMOTIL  Take 1 tablet by mouth 4 (four) times daily as needed for diarrhea or loose stools.     feeding supplement (ENSURE ENLIVE) Liqd  Take 237 mLs by mouth 3 (three) times daily.     folic acid 1 MG tablet  Commonly known as:  FOLVITE  Take 1 mg by mouth daily.     furosemide 40 MG tablet  Commonly known as:  LASIX  TAKE 1 TABLET BY MOUTH EVERY MORNING AS NEEDED FOR SWELLING     HYDROcodone-acetaminophen 10-325 MG tablet  Commonly known as:  NORCO  Take 1 tablet by mouth every 6 (six) hours as needed.     IMODIUM A-D 2 MG tablet  Generic drug:  loperamide  Take 8 mg by mouth 2 (two) times daily as needed for diarrhea or loose stools.     levalbuterol 1.25 MG/0.5ML  nebulizer solution  Commonly known as:  XOPENEX  Take 1.25 mg by nebulization every 4 (four) hours as needed for wheezing or shortness of breath.     MULTIVITAMIN PO  Take 1 tablet by mouth daily.     nystatin 100000 UNIT/GM Powd  Apply topically daily as needed (FOR IRRITATION). PRN     pantoprazole 40 MG tablet  Commonly known as:  PROTONIX  Take 1 tablet (40 mg total) by mouth daily before breakfast.     predniSONE 5 MG tablet  Commonly known as:  DELTASONE  Take 2 tablets (10 mg total) by mouth daily with breakfast.     promethazine 25 MG tablet  Commonly known as:  PHENERGAN  Take 1  tablet (25 mg total) by mouth as needed for nausea or vomiting.     sulfamethoxazole-trimethoprim 800-160 MG tablet  Commonly known as:  BACTRIM DS,SEPTRA DS  Take 1 tablet by mouth 2 (two) times daily.     T.E.D. BELOW KNEE/XL Misc  1 each by Does not apply route daily.     tigecycline 50 mg in sodium chloride 0.9 % 100 mL  Inject 50 mg into the vein every 12 (twelve) hours.     vitamin C 1000 MG tablet  Take 1,000 mg by mouth daily.     XARELTO 20 MG Tabs tablet  Generic drug:  rivaroxaban  Take 20 mg by mouth daily.     Zinc 50 MG Caps  Take 1 capsule by mouth daily.       Allergies  Allergen Reactions  . Amikacin Other (See Comments)    CAUSED DEAFNESS  . Cefoxitin Nausea Only    ABD. PAIN  . Fish Allergy Nausea And Vomiting  . Iodine Nausea Only    JOINT PAIN  . Ivp Dye [Iodinated Diagnostic Agents] Nausea Only    JOINT PAIN  . Rifampin Nausea And Vomiting and Other (See Comments)    CHILLS  . Shellfish Allergy Swelling  . Vancomycin Other (See Comments)    Red Man Syndrome - was told that it was given to fast.  . Demerol Rash   Follow-up Information    Follow up with [facility].   Why:  resume HH-RN for IV abx needs as previously ordered   Contact information:   [address] [place] [address] [phone]       Follow up with  [facility].   Why:  home 02 arranged- portable tank to be delivered to room prior to discharge   Contact information:   [address] [place] [address] [phone]        The results of significant diagnostics from this hospitalization (including imaging, microbiology, ancillary and laboratory) are listed below for reference.    Significant Diagnostic Studies: Dg Chest 2 View  05/09/2015  CLINICAL DATA:  Chest pain, history of pancreatitis EXAM: CHEST  2 VIEW COMPARISON:  11/07/2014 FINDINGS: Cardiomediastinal silhouette is stable. Right arm PICC line with tip in SVC right atrium junction. There is no pneumothorax. There is streaky interstitial infiltrate in right middle lobe and left perihilar. Degenerative changes bilateral shoulders. No pleural effusion. IMPRESSION: No pulmonary edema. Streaky interstitial infiltrate right middle lobe and left perihilar. Right PICC line is unchanged in position. Degenerative changes bilateral shoulders. Electronically Signed   By: Natasha Mead M.D.   On: 05/09/2015 09:42   Dg Abd 1 View  05/09/2015  CLINICAL DATA:  Epigastric and umbilical pain for 2 days. EXAM: ABDOMEN - 1 VIEW COMPARISON:  CT 11/06/2014 FINDINGS: Surgical clips in the right upper quadrant and left upper quadrant. Nonobstructive bowel gas pattern. Ostomy noted in the right lower quadrant. No free air organomegaly. No suspicious calcification. IMPRESSION: Right lower quadrant ostomy. No evidence of obstruction or free air. Electronically Signed   By: Charlett Nose M.D.   On: 05/09/2015 11:44   Nm Pulmonary Perf And Vent  05/09/2015  CLINICAL DATA:  Shortness of breath, history of DVT EXAM: NUCLEAR MEDICINE VENTILATION - PERFUSION LUNG SCAN TECHNIQUE: Ventilation images were obtained in multiple projections using inhaled aerosol Tc-56m DTPA. Perfusion images were obtained in multiple projections after intravenous injection of Tc-3m MAA. RADIOPHARMACEUTICALS:   31.0 millicuries  E Technetium-68m DTPA aerosol inhalation and 4.1 millicuries Technetium-60m MAA IV COMPARISON:  VQ scan of 09/10/2014 and chest x-ray 05/09/2015 FINDINGS: Ventilation: No segmental ventilation defects are noted. Please note there is clumping of the tracer with central airways deposition. Perfusion: No wedge shaped peripheral perfusion defects to suggest acute pulmonary embolism. Chest x-ray shows streaky infiltrate in right middle lobe and left perihilar suspicious for interstitial pneumonitis or atypical pneumonia. Findings are low probability for pulmonary embolus. IMPRESSION: Low probability for pulmonary embolus. Electronically Signed   By: Natasha Mead M.D.   On: 05/09/2015 15:11    Microbiology: Recent Results (from the past 240 hour(s))  Culture, Urine     Status: None   Collection Time: 05/10/15  7:08 PM  Result Value Ref Range Status   Specimen Description URINE, CLEAN CATCH  Final   Special Requests Zosyn, Tigracyl, Zithromax  Final   Culture NO GROWTH 2 DAYS  Final   Report Status 05/12/2015 FINAL  Final     Labs: Basic Metabolic Panel:  Recent Labs Lab 05/09/15 0915 05/11/15 0543 05/12/15 0500 05/13/15 0559  NA 141 139 142 141  K 4.0 4.2 4.2 4.8  CL 114* 110 113* 111  CO2 20* 23 26 25   GLUCOSE 62* 100* 108* 79  BUN 18 15 18  21*  CREATININE 1.21 1.17 1.21 1.09  CALCIUM 8.1* 7.7* 7.5* 7.5*   Liver Function Tests:  Recent Labs Lab 05/09/15 0915  AST 49*  ALT 41  ALKPHOS 303*  BILITOT 1.5*  PROT 4.1*  ALBUMIN 1.7*    Recent Labs Lab 05/09/15 0915  LIPASE 27   No results for input(s): AMMONIA in the last 168 hours. CBC:  Recent Labs Lab 05/09/15 0915 05/11/15 0543 05/12/15 0500 05/13/15 0559  WBC 14.4* 15.7* 8.9 9.7  HGB 12.0* 11.1* 9.6* 10.2*  HCT 35.9* 33.3* 27.5* 31.8*  MCV 99.4 99.1 101.9* 97.2  PLT 127* 147* 111* 137*   Cardiac Enzymes: No results for input(s): CKTOTAL, CKMB, CKMBINDEX, TROPONINI in the last 168  hours. BNP: BNP (last 3 results) No results for input(s): BNP in the last 8760 hours.  ProBNP (last 3 results) No results for input(s): PROBNP in the last 8760 hours.  CBG: No results for input(s): GLUCAP in the last 168 hours.     SignedCalvert Cantor, MD [facility] 05/13/2015, 12:22 PM

## 2015-05-13 NOTE — Progress Notes (Signed)
SATURATION QUALIFICATIONS: (This note is used to comply with regulatory documentation for home oxygen)  Patient Saturations on Room Air at Rest = 95 %  Patient Saturations on Room Air while Ambulating = 87-88%  Patient Saturations on 2 Liters of oxygen while Ambulating = 79 %  Recovers to SpO2 = 100 % on 2 Liters at rest.   Heart Rate goes to 150 at end of ambulating 100 feet.  Please briefly explain why patient needs home oxygen:

## 2015-05-13 NOTE — Consult Note (Addendum)
WOC wound consult  Wound type: Consult requested for right arm skin tear near PICC line. Measurement: Partial thickness skin loss; appearance consistent with medical adhesive related skin injury; 3X2X.1cm Wound bed: Pink and moist with small amt pink drainage.  Part of the skin tear is located underneath the PICC line dressing which has been applied and should not be disturbed to avoid infection. Dressing procedure/placement/frequency: Foam dressing has been applied but is not adhering well over the PICC line. Removed and applied xeroform gauze and Tegarderm over site; this can be changed by the home health nurse each time the PICC line dressing change is performed until area is healed. Discussed plan of care with wife and she verbalized understanding.   Please re-consult if further assistance is needed.  Thank-you,  Cammie Mcgee MSN, RN, CWOCN, [place], CNS [phone]

## 2015-05-14 ENCOUNTER — Telehealth (INDEPENDENT_AMBULATORY_CARE_PROVIDER_SITE_OTHER): Payer: Self-pay | Admitting: *Deleted

## 2015-05-14 NOTE — Telephone Encounter (Signed)
[name] called, wants you to call her [phone], Gage has been in [facility] with pneumonia, has been D/C -- she is real concerned about Latwan

## 2015-05-16 NOTE — Telephone Encounter (Signed)
Call returned. Talked with patient's wife [name] Patient treated for Serratia pneumonia and is now at home. Lab studies from recent hospitalization reviewed. He has office visit with me week after next.

## 2015-05-22 ENCOUNTER — Encounter: Payer: Self-pay | Admitting: Infectious Disease

## 2015-05-23 ENCOUNTER — Encounter: Payer: Self-pay | Admitting: Infectious Disease

## 2015-05-23 ENCOUNTER — Encounter (INDEPENDENT_AMBULATORY_CARE_PROVIDER_SITE_OTHER): Payer: Self-pay

## 2015-05-28 ENCOUNTER — Ambulatory Visit (INDEPENDENT_AMBULATORY_CARE_PROVIDER_SITE_OTHER): Payer: Managed Care, Other (non HMO) | Admitting: Internal Medicine

## 2015-05-28 ENCOUNTER — Encounter (INDEPENDENT_AMBULATORY_CARE_PROVIDER_SITE_OTHER): Payer: Self-pay | Admitting: Internal Medicine

## 2015-05-28 VITALS — BP 122/80 | HR 70 | Temp 97.2°F | Resp 18 | Ht 70.0 in | Wt 147.2 lb

## 2015-05-28 DIAGNOSIS — K50919 Crohn's disease, unspecified, with unspecified complications: Secondary | ICD-10-CM | POA: Diagnosis not present

## 2015-05-28 DIAGNOSIS — IMO0001 Reserved for inherently not codable concepts without codable children: Secondary | ICD-10-CM

## 2015-05-28 DIAGNOSIS — R1084 Generalized abdominal pain: Secondary | ICD-10-CM | POA: Diagnosis not present

## 2015-05-28 DIAGNOSIS — F411 Generalized anxiety disorder: Secondary | ICD-10-CM | POA: Diagnosis not present

## 2015-05-28 DIAGNOSIS — R7989 Other specified abnormal findings of blood chemistry: Secondary | ICD-10-CM

## 2015-05-28 DIAGNOSIS — R945 Abnormal results of liver function studies: Secondary | ICD-10-CM

## 2015-05-28 DIAGNOSIS — M869 Osteomyelitis, unspecified: Secondary | ICD-10-CM

## 2015-05-28 DIAGNOSIS — E2749 Other adrenocortical insufficiency: Secondary | ICD-10-CM

## 2015-05-28 MED ORDER — ALPRAZOLAM 0.5 MG PO TABS: ORAL_TABLET | ORAL | Status: AC

## 2015-05-28 MED ORDER — HYDROCODONE-ACETAMINOPHEN 10-325 MG PO TABS: 1.0000 | ORAL_TABLET | Freq: Four times a day (QID) | ORAL | Status: AC | PRN

## 2015-05-28 MED ORDER — PREDNISONE 5 MG PO TABS: 10.0000 mg | ORAL_TABLET | Freq: Every day | ORAL | Status: AC

## 2015-05-28 NOTE — Progress Notes (Signed)
Presenting complaint;  Abdominal pain nausea and vomiting.  Subjective:  Patient is 65 year old Caucasian male with multiple medical problems who is here for scheduled visit accompanied by his wife [name]. He was last seen on 03/25/2015 and weight 161 pounds. Today he weighs 147 pounds. He spent his Christmas at [facility] for pneumococcal pneumonia. He feels miserable. He has daily epigastric and lower abdominal pain. Pain gets worse with every meal. He has nausea and he said few episodes of vomiting. He is afraid to eat. He denies melena or bleeding into ileostomy. He is not having left wrist pain anymore. He is starting use this hand. His hearing has not improved any according to his wife and she is communicating with him by writing. [name] states he is very nervous and unable to sleep at night. He also needs pain medication refills.  He had scheduled blood work this morning.      Current Medications: Outpatient Encounter Prescriptions as of 05/28/2015  Medication Sig  . ALPRAZolam (XANAX) 0.5 MG tablet TAKE 1 TABLET BY MOUTH TWICE A DAY AS NEEDED FOR ANXIETY  . Ascorbic Acid (VITAMIN C) 1000 MG tablet Take 1,000 mg by mouth daily.  Marland Kitchen azithromycin (ZITHROMAX) 600 MG tablet Take 1 tablet (600 mg total) by mouth daily.  . Calcium Carbonate-Vitamin D (CALCIUM 600 + D PO) Take 1 tablet by mouth daily.   . CLOFAZIMINE PO Take 100 mg by mouth at bedtime.   . feeding supplement, ENSURE ENLIVE, (ENSURE ENLIVE) LIQD Take 237 mLs by mouth 3 (three) times daily.  . folic acid (FOLVITE) 1 MG tablet Take 1 mg by mouth daily.  Marland Kitchen HYDROcodone-acetaminophen (NORCO) 10-325 MG tablet Take 1 tablet by mouth every 6 (six) hours as needed.  . levalbuterol (XOPENEX) 1.25 MG/0.5ML nebulizer solution Take 1.25 mg by nebulization every 4 (four) hours as needed for wheezing or shortness of breath.  . loperamide (IMODIUM A-D) 2 MG tablet Take 8 mg by mouth 2 (two) times daily as needed for diarrhea or loose stools.  .  Multiple Vitamins-Minerals (MULTIVITAMIN PO) Take 1 tablet by mouth daily.   Marland Kitchen nystatin (MYCOSTATIN/NYSTOP) 100000 UNIT/GM POWD Apply topically daily as needed (FOR IRRITATION). PRN  . pantoprazole (PROTONIX) 40 MG tablet Take 1 tablet (40 mg total) by mouth daily before breakfast.  . predniSONE (DELTASONE) 5 MG tablet Take 2 tablets (10 mg total) by mouth daily with breakfast.  . promethazine (PHENERGAN) 25 MG tablet Take 1 tablet (25 mg total) by mouth as needed for nausea or vomiting.  . tigecycline 50 mg in sodium chloride 0.9 % 100 mL Inject 50 mg into the vein every 12 (twelve) hours.  Carlena Hurl 20 MG TABS tablet Take 20 mg by mouth daily.  . Zinc 50 MG CAPS Take 1 capsule by mouth daily.   . furosemide (LASIX) 40 MG tablet Reported on 05/28/2015  . [DISCONTINUED] dextromethorphan-guaiFENesin (MUCINEX DM) 30-600 MG 12hr tablet Take 1 tablet by mouth 2 (two) times daily as needed for cough (congestion). (Patient not taking: Reported on 05/28/2015)  . [DISCONTINUED] diphenoxylate-atropine (LOMOTIL) 2.5-0.025 MG tablet Take 1 tablet by mouth 4 (four) times daily as needed for diarrhea or loose stools. (Patient not taking: Reported on 05/09/2015)  . [DISCONTINUED] Elastic Bandages & Supports (T.E.D. BELOW KNEE/XL) MISC 1 each by Does not apply route daily. (Patient not taking: Reported on 05/28/2015)  . [DISCONTINUED] sulfamethoxazole-trimethoprim (BACTRIM DS,SEPTRA DS) 800-160 MG tablet Take 1 tablet by mouth 2 (two) times daily. (Patient not taking: Reported on 05/28/2015)  No facility-administered encounter medications on file as of 05/28/2015.     Objective: Blood pressure 122/80, pulse 70, temperature 97.2 F (36.2 C), temperature source Oral, resp. rate 18, height 5\' 10"  (1.778 m), weight 147 lb 3.2 oz (66.769 kg). Patient is alert and in no acute distress. He appears frustrated. Conjunctiva is pink. Sclera is nonicteric Oropharyngeal mucosa is normal. No neck masses or thyromegaly  noted. Cardiac exam with regular rhythm normal S1 and S2. No murmur or gallop noted. Lungs are clear to auscultation. Abdomen. He has ileostomy and right midabdomen. There is some thick pasty stool in the back with brownish orange color. Bowel sounds are normal. He has mild to moderate epigastric tenderness and mild tenderness across lower abdomen. No organomegaly or masses. No LE edema or clubbing noted.  Labs/studies Results: Lab data from this morning is pending.    Assessment:  #1. Progressive upper and lower abdominal pain associated with nausea and sporadic vomiting most likely secondary to one of the antibiotics. Other possibilities include relapse of Crohn's disease or pancreatitis.Marland Kitchen Ultrasound in July 2016 revealed hepatic steatosis and bile duct measuring 7.7 mm. #2. Weight loss secondary to poor oral intake. If no other cause is found for his weight loss dose may have to be adjusted given history of iatrogenic adrenocortical insufficiency. #3. History of Crohn's disease. He has ileostomy. He has rectum in situ. #4. History of left wrist osteomyelitis secondary to atypical mycobacterial infection. #5. History of elevated transaminases secondary to antibiotics. LFTs from this morning pending. #6. Anemia secondary to chronic disease. #7. Anxiety and insomnia secondary to chronic illness. He may benefit from higher dose of anxiolytic.  Plan:  Increase Alprazolam to 0.5 mg by mouth 3 times a day. Prescription given for one month with 2 refills. Prescription also given for hydrocodone/acetaminophen 10/325 one tablet every 6 hours as needed 120 doses without refill. New prescription also given for prednisone which she will continue 10 mg by mouth daily. Abdominopelvic CT with oral contrast ASAP. Review blood work from this morning when available.

## 2015-05-28 NOTE — Patient Instructions (Signed)
Abdominopelvic CT with oral contrast only to be scheduled

## 2015-05-31 ENCOUNTER — Telehealth: Payer: Self-pay | Admitting: *Deleted

## 2015-05-31 NOTE — Telephone Encounter (Signed)
Call from Dr. Karilyn Cota about this mutual patient. Requesting that Dr. Daiva Eves call him, his pager is [phone]. Ok to wait until Monday. Wendall Mola

## 2015-06-03 ENCOUNTER — Encounter (INDEPENDENT_AMBULATORY_CARE_PROVIDER_SITE_OTHER): Payer: Self-pay

## 2015-06-06 ENCOUNTER — Encounter: Payer: Self-pay | Admitting: Infectious Disease

## 2015-06-06 ENCOUNTER — Ambulatory Visit (INDEPENDENT_AMBULATORY_CARE_PROVIDER_SITE_OTHER): Payer: Managed Care, Other (non HMO) | Admitting: Infectious Disease

## 2015-06-06 VITALS — BP 126/81 | HR 76 | Temp 98.6°F | Wt 156.0 lb

## 2015-06-06 DIAGNOSIS — M009 Pyogenic arthritis, unspecified: Secondary | ICD-10-CM | POA: Diagnosis not present

## 2015-06-06 DIAGNOSIS — A4153 Sepsis due to Serratia: Secondary | ICD-10-CM

## 2015-06-06 DIAGNOSIS — M86642 Other chronic osteomyelitis, left hand: Secondary | ICD-10-CM | POA: Diagnosis not present

## 2015-06-06 DIAGNOSIS — R630 Anorexia: Secondary | ICD-10-CM

## 2015-06-06 DIAGNOSIS — A319 Mycobacterial infection, unspecified: Secondary | ICD-10-CM

## 2015-06-06 DIAGNOSIS — K50018 Crohn's disease of small intestine with other complication: Secondary | ICD-10-CM

## 2015-06-06 DIAGNOSIS — R627 Adult failure to thrive: Secondary | ICD-10-CM

## 2015-06-06 MED ORDER — MIRTAZAPINE 15 MG PO TBDP: 15.0000 mg | ORAL_TABLET | Freq: Every day | ORAL | Status: AC

## 2015-06-06 NOTE — Progress Notes (Signed)
Chief complaint: worsening abdominal pain  Subjective:    Patient ID: Lee Klein, male    DOB: 28-Jun-1950, 65 y.o.   MRN: [id number]  HPI   65 y.o. 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 Fredna Dow in January of 2016.  He was admitted to the hospital and we started patient on IV cefoxitin high dose, BID clarithro and BID zyvox.  He had incision and drainage of wrist with thorough debridementincluding distal radioulnar joint, incision and drainage of palm withdrainage of 2 abscesses, one on the index, one on the ring finger on February 5th, 2016.  Since he left the hospital he is having a VERY hard time tolerating his abx with metallic taste in mouth, poor appetite nausea and feeling of burning down his throat.  He had sensitivities sent off to [facility] as well as to [facility] and they're listed below.       [facility]:       We switched him over to intravenous tigecycline along with 3 times weekly IV amikacin and continued azithromycin.  His left hand swelled up further and he required further incision and debridement by Dr. Fredna Dow. He found what appear to be infected bone consistent with MRI findings but did not remove actual bone but scraped unhealthy material from around this and sent this for bacterial cultures which were negative.  In the interim the patient also had continued to feel poorly and his gastroenterologist  suggested that he might be suffering from adrenal insufficiency. Apparently the patient been on prednisone for 39 years and had been down on 5 mg of prednisone when seen in the hospital but was taken off of it "cold [place]". He was placed back on prednisone at 10 mg and now 5 mg a day and is improved much and this with much less nausea malaise and fatigue. His appetite also has returned. This despite the fact he is also getting  tigecycline and azithromycin.   He underwent another I and D on 08/09/14 and there was no  m abscessus growth.  Since his visit in April he developed sudden hearing loss and became  functionally deaf and not able to hear anything I spoke to him in clinic.   It appears he may have received amikacin twice when this was first noticed because our group in ID were not aware of this until he had 3 days of hearing loss and they had already gone to PCP and audiologist..  We restarted him on IV cefoxitin along with tygacil and azithromycin but he very quickly developed abdominal pain, malaise, nausea. After a few days of receiving infusion he refused it saying "I think I am going to die". Cefoxitin was stopped but GI discomfort persisted and he was admitted with pancreatitis thought possibly to be due to cefoxitin. Initially his regimen was held but then re-introduced along with clofazimine which we acquired from [facility] and he has been able to tolerate this without trouble. His transaminases and alk phosph had been up a bit but were stable  He had area that swelled up and burst on volar part of hand that required YET another I and D (13th surgery) and fresh cultures taken in July. He had yet another surgery on August 16th, 2016 but has had no further surgeries since then. He has continued to "solidier on' with IV tygacil, oral azithro and clofazamine.   He was recently  admitted for what was thought to be HCAP with hypoxia but blood cultures actually grew serratia from 2/2 cultures. He was treated with zosyn and then narrowed to oral bactrim.  I have anxiety he may have had PICC line related bacteremia given that the serratia is NOT a typical organism for PNA and his CXR not overhwelming and that this organism would typically be S to tygacil.  He has since developed worsening abdominal pain and worsening appetite. He has seen GI and had CT scan that did not show abdominal pathology. Concern is that his GI  problems The problem is that despite his having had nice response to protracted antibiotics and repeated surgeries but this organims is NOTORIOUSLY DIFFICULT TO treat in normal host, let alone in immunocompromised one. The type of infection he has typically requires minimum of a year of multi drug therapy from date of last surgery.  Past Medical History  Diagnosis Date  . Crohn's disease ([place])   . GERD (gastroesophageal reflux disease)   . Anxiety   . 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 PICC (peripherally inserted central catheter) in place     right arm  . Osteomyelitis of hand, left, acute ([place])   . Hearing loss   . Pancreatitis 10/2014  . History of kidney stones   . History of pancreatitis 11/06/2014  . Mycobacterial infection     left hand/wrist  . Thin skin   . Septic arthritis of wrist, left ([place])   . DVT (deep venous thrombosis) ([place]) 04/2015    left leg  . Pneumonia 04/2015    Past Surgical History  Procedure Laterality Date  . Colonoscopy    . Appendectomy  ~ 2009  . Ileostomy closure N/A 10/23/2013    Procedure: ILEOSTOMY TAKEDOWN, REPAIR OF PARASTOMAL HERNIA.;  Surgeon: Gwenyth Ober, MD;  Location: [place];  Service: General;  Laterality: N/A;  . Wrist arthroscopy with debridement Left 05/24/2014    Procedure: LEFT ARTHROSCOPY WRIST CULTURE/BIOPSY DEBRIDEMENT;  Surgeon: Daryll Brod, MD;  Location: [place];  Service: Orthopedics;  Laterality: Left;  . Esophagogastroduodenoscopy N/A 06/15/2014    Procedure: ESOPHAGOGASTRODUODENOSCOPY (EGD);  Surgeon: Rogene Houston, MD;  [facility];  Service: Endoscopy;  Laterality: N/A;  . Cholecystectomy  ~ 2008  . Parastomal hernia repair  10/2013  . Incision and drainage abscess Left 06/22/2014    Procedure: INCISION, DEBRIDEMENT AND IRRIGATION LEFT HAND/WRIST;  Surgeon: Daryll Brod, MD;  Location: [place];  Service: Orthopedics;  Laterality: Left;  . Incision  and drainage of wound Left 07/27/2014    Procedure: IRRIGATION AND DEBRIDEMENT WOUND;  Surgeon: Daryll Brod, MD;  Location: [place];  Service: Orthopedics;  Laterality: Left;  . Incision and drainage abscess Left 08/09/2014    Procedure: INCISION AND DRAINAGE LEFT WRIST;  Surgeon: Daryll Brod, MD;  Location: [place];  Service: Orthopedics;  Laterality: Left;  . Incision and drainage abscess Left 09/05/2014    Procedure: INCISION AND DRAINAGE LEFT HAND;  Surgeon: Daryll Brod, MD;  Location: [place];  Service: Orthopedics;  Laterality: Left;  . I&d extremity Left 10/02/2014    Procedure: IRRIGATION AND DEBRIDEMENT  LEFT HAND;  Surgeon: Daryll Brod, MD;  Location: [place];  Service: Orthopedics;  Laterality: Left;  . I&d extremity Left 10/12/2014    Procedure: MINOR IRRIGATION AND DEBRIDEMENT LEFT HAND;  Surgeon: Daryll Brod, MD;  Location[place];  Service: Orthopedics;  Laterality: Left;  . Incision and drainage abscess Left 11/12/2014    Procedure: INCISION AND DRAINAGE ABSCESS LEFT HAND;  Surgeon: Daryll Brod, MD;  Location: [place];  Service: Orthopedics;  Laterality: Left;  . Incision and drainage Left 11/27/2014    Procedure: INCISION AND DRAINAGE LEFT HAND;  Surgeon: Daryll Brod, MD;  Location: [place];  Service: Orthopedics;  Laterality: Left;  . Tonsillectomy  1963  . Colon surgery  ~ 2010    for a colonic stricture at the anastomosis from a colostomy reversal/notes 10/03/2013  . Parastomal hernia repair  11/02/2013  . Orif distal radius fracture Right 06/29/2006  . Ileostomy    . Ileoscopy  07/02/2010  . I&d extremity Left 01/01/2015    Procedure: IRRIGATION AND DEBRIDEMENT LEFT HAND/WRIST;  Surgeon: Daryll Brod, MD;  Location: [place];  Service: Orthopedics;  Laterality: Left;    No family history on file.    Social History   Social History  . Marital  Status: Married    Spouse Name: N/A  . Number of Children: N/A  . Years of Education: N/A   Social History Main Topics  . Smoking status: Former Smoker -- 30 years    Quit date: 05/31/2006  . Smokeless tobacco: Never Used  . Alcohol Use: 0.0 oz/week    0 Standard drinks or equivalent per week     Comment: rare  . Drug Use: No  . Sexual Activity:    Partners: Female    Museum/gallery curator: None   Other Topics Concern  . None   Social History Narrative    Allergies  Allergen Reactions  . Amikacin Other (See Comments)    CAUSED DEAFNESS  . Cefoxitin Nausea Only    ABD. PAIN  . Fish Allergy Nausea And Vomiting  . Iodine Nausea Only    JOINT PAIN  . Ivp Dye [Iodinated Diagnostic Agents] Nausea Only    JOINT PAIN  . Rifampin Nausea And Vomiting and Other (See Comments)    CHILLS  . Shellfish Allergy Swelling  . Vancomycin Other (See Comments)    Red Man Syndrome - was told that it was given to fast.  . Demerol Rash     Current outpatient prescriptions:  .  ALPRAZolam (XANAX) 0.5 MG tablet, TAKE 1 TABLET BY MOUTH three times  A DAY AS NEEDED FOR ANXIETY, Disp: 90 tablet, Rfl: 2 .  Ascorbic Acid (VITAMIN C) 1000 MG tablet, Take 1,000 mg by mouth daily., Disp: , Rfl:  .  azithromycin (ZITHROMAX) 600 MG tablet, Take 1 tablet (600 mg total) by mouth daily., Disp: 7 tablet, Rfl: 0 .  Calcium Carbonate-Vitamin D (CALCIUM 600 + D PO), Take 1 tablet by mouth daily. , Disp: , Rfl:  .  CLOFAZIMINE PO, Take 100 mg by mouth at bedtime. , Disp: , Rfl:  .  feeding supplement, ENSURE ENLIVE, (ENSURE ENLIVE) LIQD, Take 237 mLs by mouth 3 (three) times daily., Disp: 237 mL, Rfl: 12 .  folic acid (FOLVITE) 1 MG tablet, Take 1 mg by mouth daily., Disp: , Rfl:  .  furosemide (LASIX) 40 MG tablet, Reported on 05/28/2015, Disp: , Rfl: 3 .  HYDROcodone-acetaminophen (NORCO) 10-325 MG tablet, Take 1 tablet by mouth every 6 (six) hours as needed., Disp: 120 tablet, Rfl: 0 .  levalbuterol  (XOPENEX) 1.25 MG/0.5ML nebulizer solution, Take 1.25 mg by nebulization every 4 (four) hours as needed for wheezing or  shortness of breath., Disp: 1 each, Rfl: 12 .  loperamide (IMODIUM A-D) 2 MG tablet, Take 8 mg by mouth 2 (two) times daily as needed for diarrhea or loose stools., Disp: , Rfl:  .  mirtazapine (REMERON SOL-TAB) 15 MG disintegrating tablet, Take 1 tablet (15 mg total) by mouth at bedtime., Disp: 30 tablet, Rfl: 11 .  Multiple Vitamins-Minerals (MULTIVITAMIN PO), Take 1 tablet by mouth daily. , Disp: , Rfl:  .  nystatin (MYCOSTATIN/NYSTOP) 100000 UNIT/GM POWD, Apply topically daily as needed (FOR IRRITATION). PRN, Disp: , Rfl: 1 .  pantoprazole (PROTONIX) 40 MG tablet, Take 1 tablet (40 mg total) by mouth daily before breakfast., Disp: 90 tablet, Rfl: 3 .  predniSONE (DELTASONE) 5 MG tablet, Take 2 tablets (10 mg total) by mouth daily with breakfast., Disp: 180 tablet, Rfl: 0 .  promethazine (PHENERGAN) 25 MG tablet, Take 1 tablet (25 mg total) by mouth as needed for nausea or vomiting., Disp: 60 tablet, Rfl: 2 .  tigecycline 50 mg in sodium chloride 0.9 % 100 mL, Inject 50 mg into the vein every 12 (twelve) hours., Disp: 1 vial, Rfl: 0 .  XARELTO 20 MG TABS tablet, Take 20 mg by mouth daily., Disp: , Rfl: 5 .  Zinc 50 MG CAPS, Take 1 capsule by mouth daily. , Disp: , Rfl:    Review of Systems  Constitutional: Positive for appetite change, fatigue and unexpected weight change. Negative for fever, chills and diaphoresis.  HENT: Negative for congestion, rhinorrhea, sinus pressure, sneezing and sore throat.   Eyes: Negative for photophobia and visual disturbance.  Respiratory: Negative for cough, chest tightness, shortness of breath, wheezing and stridor.   Cardiovascular: Negative for chest pain, palpitations and leg swelling.  Gastrointestinal: Positive for nausea, abdominal pain and diarrhea. Negative for vomiting, constipation, blood in stool, abdominal distention and anal  bleeding.  Genitourinary: Negative for dysuria, hematuria, flank pain and difficulty urinating.  Musculoskeletal: Negative for myalgias, back pain, joint swelling, arthralgias and gait problem.  Skin: Positive for wound.  Allergic/Immunologic: Positive for immunocompromised state.  Neurological: Positive for weakness. Negative for dizziness and light-headedness.  Hematological: Negative for adenopathy. Bruises/bleeds easily.  Psychiatric/Behavioral: Negative for behavioral problems, confusion, sleep disturbance, decreased concentration and agitation.       Objective:   Physical Exam  Constitutional: He is oriented to person, place, and time. He appears well-nourished.  HENT:  Head: Normocephalic and atraumatic.  Mouth/Throat: Oropharynx is clear and moist. No oropharyngeal exudate.  Eyes: Conjunctivae and EOM are normal.  Neck: Normal range of motion. Neck supple.  Cardiovascular: Normal rate and regular rhythm.   Pulmonary/Chest: Effort normal. No respiratory distress. He has no wheezes.  Abdominal: Soft. He exhibits no distension. There is tenderness. There is no rebound and no guarding.  Musculoskeletal: Normal range of motion. He exhibits no edema or tenderness.  Neurological: He is alert and oriented to person, place, and time.  Skin: Skin is warm and dry. No rash noted. No erythema. No pallor.  Psychiatric: He has a normal mood and affect. His behavior is normal.    Left hand 06/06/15;          Assessment & Plan:   Mycobacterium abscessus severe hand infection with bone tendon, abscess sp  Multiple surgeries   We reviewed the importance of maintaining him on at least TWO active drugs for a year from date of his surgery and my preference would be for 3  If we have to stop one drug to see if  it improves GI pain will consider stopping IV tygacil vs clofazimine   Abdominal pain: could very well be due to his antibiotics though worse per wife after bactrim. We will see if  remeron might help along with his appetite problems. We may need to do trial off one of his drugs   Pancreatitis: due to cefoxitin? Due to choledocholithiasis  Adrenal insufficiency: Apparent adrenal insufficiency based on conical history on steroids and knee feels much better I would like to have him tapered off carefully   Serratia bacteremia: I am suspicious this could have been PICC line related infection rather than HCAP  --check surveillance cultures, vigilance for recurrence  Poor appetite: likely also due to abx + illness, narcotics will try remeron and then consider trial of marinol with caution for sedating effects  I spent greater than 40   minutes with the patient including greater than 50% of time in face to face counsel of the patient re his M abscessus, AI, hearing loss, Crohns disease, pancreatitis, abdominal pain, weight loss, loss of appetite and weight and in coordination of his care.

## 2015-06-12 LAB — CULTURE, BLOOD (SINGLE)
Organism ID, Bacteria: NO GROWTH
Organism ID, Bacteria: NO GROWTH

## 2015-06-12 IMAGING — XA IR FLUORO GUIDE CV LINE*R*
1 series · 2 of 2 positions shown · non-contrast
Comparison: none

CLINICAL DATA: Hand abscess

[Series 1: run · 2 of 2 slices shown]
[im 1/2]
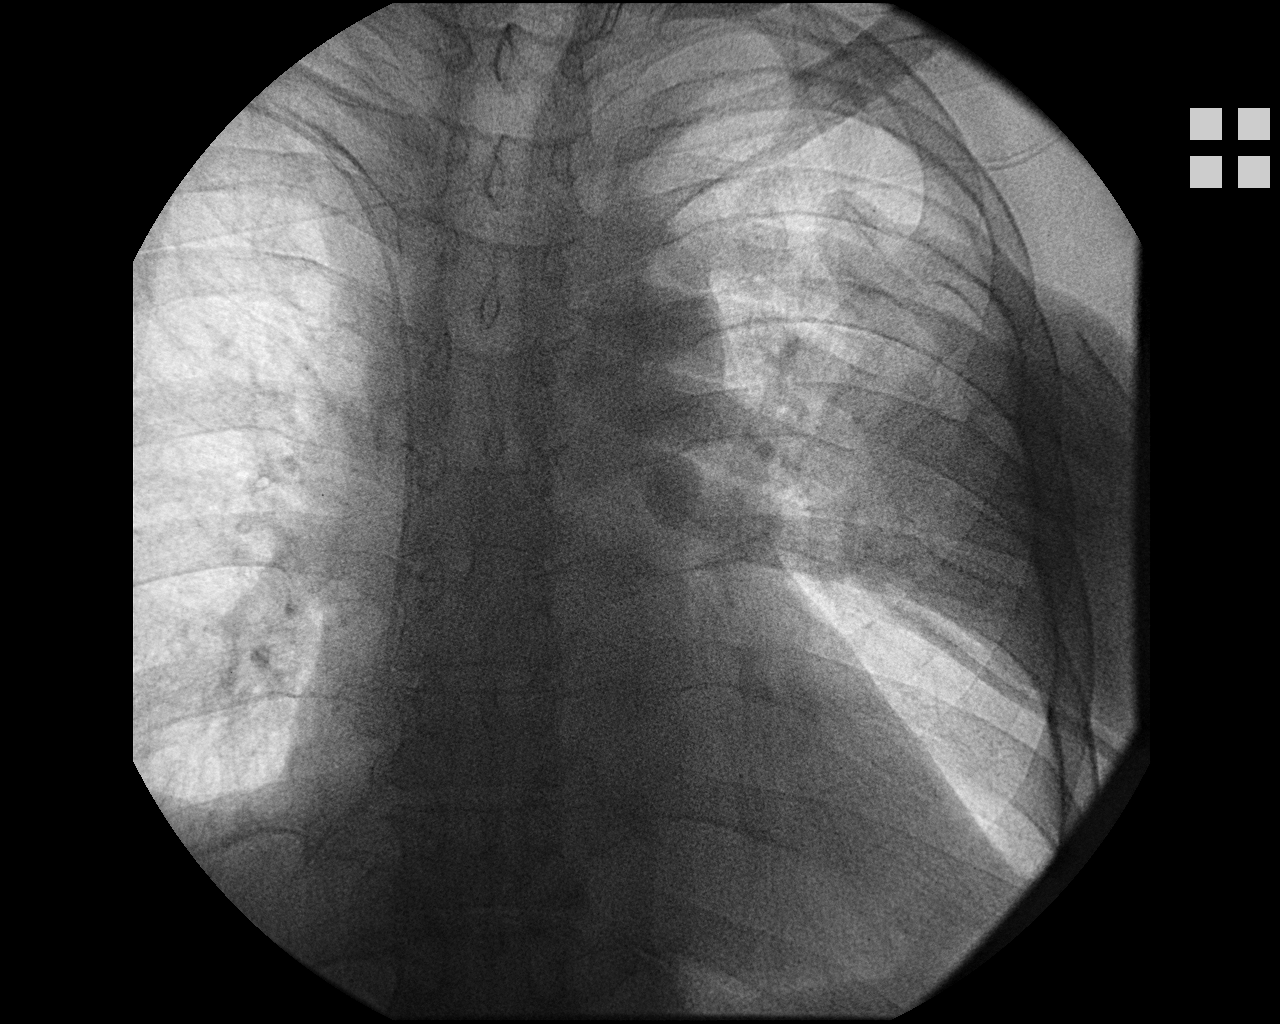
[im 2/2]
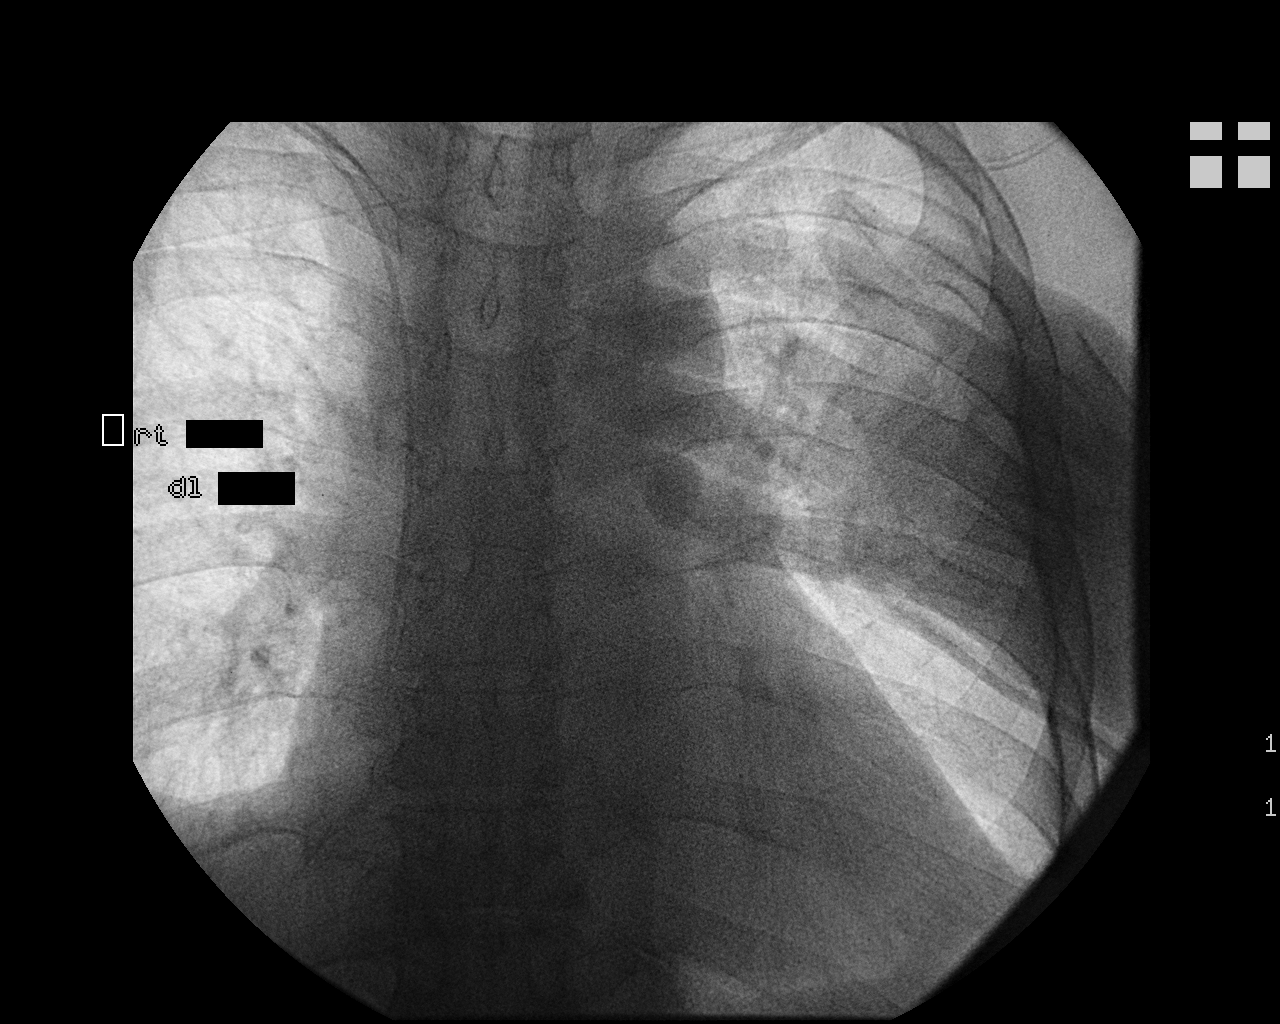

[2 of 2 positions shown; findings below may reference images not displayed]

EXAM:
RIGHT UPPER EXTREMITY PICC LINE PLACEMENT WITH ULTRASOUND AND
FLUOROSCOPIC GUIDANCE

FLUOROSCOPY TIME:  6 seconds

PROCEDURE:
The patient was advised of the possible risks and complications and
agreed to undergo the procedure. The patient was then brought to the
angiographic suite for the procedure.

The right arm was prepped with chlorhexidine, draped in the usual
sterile fashion using maximum barrier technique (cap and mask,
sterile gown, sterile gloves, large sterile sheet, hand hygiene and
cutaneous antisepsis) and infiltrated locally with 1% Lidocaine.

Ultrasound demonstrated patency of the right basilic vein, and this
was documented with an image. Under real-time ultrasound guidance,
this vein was accessed with a 21 gauge micropuncture needle and
image documentation was performed. A [DATE] wire was introduced in to
the vein. Over this, a 5 French double lumen power PICC was advanced
to the lower SVC/right atrial junction. Fluoroscopy during the
procedure and fluoro spot radiograph confirms appropriate catheter
position. The catheter was flushed and covered with a sterile
dressing.

COMPLICATIONS:
None

LENGTH:
38 cm
IMPRESSION: Successful right arm power PICC line placement with ultrasound and
fluoroscopic guidance. The catheter is ready for use.

## 2015-06-14 ENCOUNTER — Encounter (INDEPENDENT_AMBULATORY_CARE_PROVIDER_SITE_OTHER): Payer: Self-pay

## 2015-06-17 ENCOUNTER — Telehealth: Payer: Self-pay | Admitting: *Deleted

## 2015-06-17 NOTE — Telephone Encounter (Signed)
Dr. Daiva Eves had reviewed these results.  Shared that both cultures were "no growth x 5 days."  Wife verbalized understanding.

## 2015-06-18 ENCOUNTER — Encounter (INDEPENDENT_AMBULATORY_CARE_PROVIDER_SITE_OTHER): Payer: Self-pay

## 2015-06-19 ENCOUNTER — Encounter: Payer: Self-pay | Admitting: Infectious Disease

## 2015-06-25 ENCOUNTER — Encounter (INDEPENDENT_AMBULATORY_CARE_PROVIDER_SITE_OTHER): Payer: Self-pay

## 2015-07-03 ENCOUNTER — Encounter (INDEPENDENT_AMBULATORY_CARE_PROVIDER_SITE_OTHER): Payer: Self-pay

## 2015-07-04 ENCOUNTER — Telehealth (INDEPENDENT_AMBULATORY_CARE_PROVIDER_SITE_OTHER): Payer: Self-pay | Admitting: Internal Medicine

## 2015-07-04 ENCOUNTER — Other Ambulatory Visit: Payer: Self-pay | Admitting: Infectious Disease

## 2015-07-04 NOTE — Telephone Encounter (Signed)
[name], Mr. Naylor' wife called today wanting to speak to someone regarding concerns she has for [facility]. She's also wondering if Dr. Karilyn Cota has been able to speak to Dr. Neita Carp about a particular medication for [facility]. She'd like a phone call back please.  [name]'s ph# [phone] and [phone]. Thank you.

## 2015-07-05 ENCOUNTER — Emergency Department (HOSPITAL_COMMUNITY): Payer: Managed Care, Other (non HMO)

## 2015-07-05 ENCOUNTER — Inpatient Hospital Stay (HOSPITAL_COMMUNITY)
Admission: EM | Admit: 2015-07-05 | Discharge: 2015-08-17 | DRG: 003 | Disposition: E | Payer: Managed Care, Other (non HMO) | Attending: Pulmonary Disease | Admitting: Pulmonary Disease

## 2015-07-05 ENCOUNTER — Encounter (HOSPITAL_COMMUNITY): Payer: Self-pay | Admitting: Emergency Medicine

## 2015-07-05 ENCOUNTER — Inpatient Hospital Stay (HOSPITAL_COMMUNITY): Payer: Managed Care, Other (non HMO)

## 2015-07-05 DIAGNOSIS — R945 Abnormal results of liver function studies: Secondary | ICD-10-CM

## 2015-07-05 DIAGNOSIS — T368X5A Adverse effect of other systemic antibiotics, initial encounter: Secondary | ICD-10-CM | POA: Diagnosis present

## 2015-07-05 DIAGNOSIS — I5030 Unspecified diastolic (congestive) heart failure: Secondary | ICD-10-CM | POA: Diagnosis present

## 2015-07-05 DIAGNOSIS — N179 Acute kidney failure, unspecified: Secondary | ICD-10-CM | POA: Diagnosis present

## 2015-07-05 DIAGNOSIS — E2749 Other adrenocortical insufficiency: Secondary | ICD-10-CM | POA: Diagnosis present

## 2015-07-05 DIAGNOSIS — Z79899 Other long term (current) drug therapy: Secondary | ICD-10-CM

## 2015-07-05 DIAGNOSIS — I82409 Acute embolism and thrombosis of unspecified deep veins of unspecified lower extremity: Secondary | ICD-10-CM

## 2015-07-05 DIAGNOSIS — E162 Hypoglycemia, unspecified: Secondary | ICD-10-CM | POA: Diagnosis not present

## 2015-07-05 DIAGNOSIS — E43 Unspecified severe protein-calorie malnutrition: Secondary | ICD-10-CM | POA: Diagnosis present

## 2015-07-05 DIAGNOSIS — K863 Pseudocyst of pancreas: Secondary | ICD-10-CM | POA: Diagnosis not present

## 2015-07-05 DIAGNOSIS — K859 Acute pancreatitis without necrosis or infection, unspecified: Secondary | ICD-10-CM | POA: Diagnosis not present

## 2015-07-05 DIAGNOSIS — R1013 Epigastric pain: Secondary | ICD-10-CM | POA: Diagnosis not present

## 2015-07-05 DIAGNOSIS — I639 Cerebral infarction, unspecified: Secondary | ICD-10-CM

## 2015-07-05 DIAGNOSIS — E8809 Other disorders of plasma-protein metabolism, not elsewhere classified: Secondary | ICD-10-CM | POA: Diagnosis present

## 2015-07-05 DIAGNOSIS — R1012 Left upper quadrant pain: Secondary | ICD-10-CM | POA: Diagnosis not present

## 2015-07-05 DIAGNOSIS — K5090 Crohn's disease, unspecified, without complications: Secondary | ICD-10-CM

## 2015-07-05 DIAGNOSIS — E273 Drug-induced adrenocortical insufficiency: Secondary | ICD-10-CM | POA: Diagnosis present

## 2015-07-05 DIAGNOSIS — Z8719 Personal history of other diseases of the digestive system: Secondary | ICD-10-CM

## 2015-07-05 DIAGNOSIS — T80211A Bloodstream infection due to central venous catheter, initial encounter: Principal | ICD-10-CM | POA: Diagnosis present

## 2015-07-05 DIAGNOSIS — B356 Tinea cruris: Secondary | ICD-10-CM | POA: Diagnosis not present

## 2015-07-05 DIAGNOSIS — J1100 Influenza due to unidentified influenza virus with unspecified type of pneumonia: Secondary | ICD-10-CM | POA: Diagnosis not present

## 2015-07-05 DIAGNOSIS — G43A0 Cyclical vomiting, not intractable: Secondary | ICD-10-CM | POA: Diagnosis present

## 2015-07-05 DIAGNOSIS — E440 Moderate protein-calorie malnutrition: Secondary | ICD-10-CM | POA: Insufficient documentation

## 2015-07-05 DIAGNOSIS — R109 Unspecified abdominal pain: Secondary | ICD-10-CM

## 2015-07-05 DIAGNOSIS — Z515 Encounter for palliative care: Secondary | ICD-10-CM | POA: Diagnosis present

## 2015-07-05 DIAGNOSIS — B9689 Other specified bacterial agents as the cause of diseases classified elsewhere: Secondary | ICD-10-CM | POA: Diagnosis not present

## 2015-07-05 DIAGNOSIS — R630 Anorexia: Secondary | ICD-10-CM | POA: Insufficient documentation

## 2015-07-05 DIAGNOSIS — I82402 Acute embolism and thrombosis of unspecified deep veins of left lower extremity: Secondary | ICD-10-CM

## 2015-07-05 DIAGNOSIS — R6521 Severe sepsis with septic shock: Secondary | ICD-10-CM | POA: Diagnosis present

## 2015-07-05 DIAGNOSIS — Z7901 Long term (current) use of anticoagulants: Secondary | ICD-10-CM | POA: Diagnosis not present

## 2015-07-05 DIAGNOSIS — A4150 Gram-negative sepsis, unspecified: Secondary | ICD-10-CM | POA: Diagnosis present

## 2015-07-05 DIAGNOSIS — Z01818 Encounter for other preprocedural examination: Secondary | ICD-10-CM

## 2015-07-05 DIAGNOSIS — K853 Drug induced acute pancreatitis without necrosis or infection: Secondary | ICD-10-CM | POA: Diagnosis not present

## 2015-07-05 DIAGNOSIS — M86642 Other chronic osteomyelitis, left hand: Secondary | ICD-10-CM | POA: Diagnosis present

## 2015-07-05 DIAGNOSIS — A319 Mycobacterial infection, unspecified: Secondary | ICD-10-CM | POA: Diagnosis present

## 2015-07-05 DIAGNOSIS — Z792 Long term (current) use of antibiotics: Secondary | ICD-10-CM

## 2015-07-05 DIAGNOSIS — E870 Hyperosmolality and hypernatremia: Secondary | ICD-10-CM | POA: Diagnosis not present

## 2015-07-05 DIAGNOSIS — J939 Pneumothorax, unspecified: Secondary | ICD-10-CM | POA: Diagnosis not present

## 2015-07-05 DIAGNOSIS — E860 Dehydration: Secondary | ICD-10-CM | POA: Diagnosis present

## 2015-07-05 DIAGNOSIS — F419 Anxiety disorder, unspecified: Secondary | ICD-10-CM | POA: Diagnosis present

## 2015-07-05 DIAGNOSIS — J80 Acute respiratory distress syndrome: Secondary | ICD-10-CM

## 2015-07-05 DIAGNOSIS — E874 Mixed disorder of acid-base balance: Secondary | ICD-10-CM | POA: Diagnosis present

## 2015-07-05 DIAGNOSIS — IMO0002 Reserved for concepts with insufficient information to code with codable children: Secondary | ICD-10-CM

## 2015-07-05 DIAGNOSIS — R7989 Other specified abnormal findings of blood chemistry: Secondary | ICD-10-CM | POA: Diagnosis not present

## 2015-07-05 DIAGNOSIS — M869 Osteomyelitis, unspecified: Secondary | ICD-10-CM | POA: Insufficient documentation

## 2015-07-05 DIAGNOSIS — Z91013 Allergy to seafood: Secondary | ICD-10-CM | POA: Diagnosis not present

## 2015-07-05 DIAGNOSIS — K8500 Idiopathic acute pancreatitis without necrosis or infection: Secondary | ICD-10-CM | POA: Diagnosis not present

## 2015-07-05 DIAGNOSIS — E2740 Unspecified adrenocortical insufficiency: Secondary | ICD-10-CM | POA: Diagnosis present

## 2015-07-05 DIAGNOSIS — R000 Tachycardia, unspecified: Secondary | ICD-10-CM | POA: Insufficient documentation

## 2015-07-05 DIAGNOSIS — Z9689 Presence of other specified functional implants: Secondary | ICD-10-CM

## 2015-07-05 DIAGNOSIS — E872 Acidosis, unspecified: Secondary | ICD-10-CM | POA: Insufficient documentation

## 2015-07-05 DIAGNOSIS — K8501 Idiopathic acute pancreatitis with uninfected necrosis: Secondary | ICD-10-CM | POA: Diagnosis not present

## 2015-07-05 DIAGNOSIS — R1010 Upper abdominal pain, unspecified: Secondary | ICD-10-CM | POA: Diagnosis not present

## 2015-07-05 DIAGNOSIS — H903 Sensorineural hearing loss, bilateral: Secondary | ICD-10-CM | POA: Diagnosis present

## 2015-07-05 DIAGNOSIS — D696 Thrombocytopenia, unspecified: Secondary | ICD-10-CM | POA: Diagnosis not present

## 2015-07-05 DIAGNOSIS — I9789 Other postprocedural complications and disorders of the circulatory system, not elsewhere classified: Secondary | ICD-10-CM | POA: Diagnosis not present

## 2015-07-05 DIAGNOSIS — Z9289 Personal history of other medical treatment: Secondary | ICD-10-CM

## 2015-07-05 DIAGNOSIS — Y838 Other surgical procedures as the cause of abnormal reaction of the patient, or of later complication, without mention of misadventure at the time of the procedure: Secondary | ICD-10-CM | POA: Diagnosis not present

## 2015-07-05 DIAGNOSIS — Z87891 Personal history of nicotine dependence: Secondary | ICD-10-CM | POA: Diagnosis not present

## 2015-07-05 DIAGNOSIS — T80219A Unspecified infection due to central venous catheter, initial encounter: Secondary | ICD-10-CM | POA: Insufficient documentation

## 2015-07-05 DIAGNOSIS — R634 Abnormal weight loss: Secondary | ICD-10-CM | POA: Diagnosis not present

## 2015-07-05 DIAGNOSIS — Z23 Encounter for immunization: Secondary | ICD-10-CM

## 2015-07-05 DIAGNOSIS — R0600 Dyspnea, unspecified: Secondary | ICD-10-CM

## 2015-07-05 DIAGNOSIS — Z888 Allergy status to other drugs, medicaments and biological substances status: Secondary | ICD-10-CM | POA: Diagnosis not present

## 2015-07-05 DIAGNOSIS — Z883 Allergy status to other anti-infective agents status: Secondary | ICD-10-CM

## 2015-07-05 DIAGNOSIS — Z932 Ileostomy status: Secondary | ICD-10-CM

## 2015-07-05 DIAGNOSIS — R0689 Other abnormalities of breathing: Secondary | ICD-10-CM | POA: Insufficient documentation

## 2015-07-05 DIAGNOSIS — Z66 Do not resuscitate: Secondary | ICD-10-CM | POA: Diagnosis not present

## 2015-07-05 DIAGNOSIS — A498 Other bacterial infections of unspecified site: Secondary | ICD-10-CM | POA: Insufficient documentation

## 2015-07-05 DIAGNOSIS — R079 Chest pain, unspecified: Secondary | ICD-10-CM

## 2015-07-05 DIAGNOSIS — A419 Sepsis, unspecified organism: Secondary | ICD-10-CM | POA: Insufficient documentation

## 2015-07-05 DIAGNOSIS — R791 Abnormal coagulation profile: Secondary | ICD-10-CM | POA: Diagnosis not present

## 2015-07-05 DIAGNOSIS — Z91041 Radiographic dye allergy status: Secondary | ICD-10-CM

## 2015-07-05 DIAGNOSIS — A4153 Sepsis due to Serratia: Secondary | ICD-10-CM | POA: Diagnosis not present

## 2015-07-05 DIAGNOSIS — K50018 Crohn's disease of small intestine with other complication: Secondary | ICD-10-CM | POA: Diagnosis not present

## 2015-07-05 DIAGNOSIS — Z9049 Acquired absence of other specified parts of digestive tract: Secondary | ICD-10-CM | POA: Diagnosis not present

## 2015-07-05 DIAGNOSIS — E876 Hypokalemia: Secondary | ICD-10-CM | POA: Diagnosis not present

## 2015-07-05 DIAGNOSIS — Z86718 Personal history of other venous thrombosis and embolism: Secondary | ICD-10-CM | POA: Diagnosis not present

## 2015-07-05 DIAGNOSIS — J811 Chronic pulmonary edema: Secondary | ICD-10-CM

## 2015-07-05 DIAGNOSIS — T17408A Unspecified foreign body in trachea causing other injury, initial encounter: Secondary | ICD-10-CM | POA: Insufficient documentation

## 2015-07-05 DIAGNOSIS — Z430 Encounter for attention to tracheostomy: Secondary | ICD-10-CM

## 2015-07-05 DIAGNOSIS — K219 Gastro-esophageal reflux disease without esophagitis: Secondary | ICD-10-CM | POA: Diagnosis present

## 2015-07-05 DIAGNOSIS — K5000 Crohn's disease of small intestine without complications: Secondary | ICD-10-CM | POA: Insufficient documentation

## 2015-07-05 DIAGNOSIS — M009 Pyogenic arthritis, unspecified: Secondary | ICD-10-CM | POA: Insufficient documentation

## 2015-07-05 DIAGNOSIS — J810 Acute pulmonary edema: Secondary | ICD-10-CM | POA: Diagnosis not present

## 2015-07-05 DIAGNOSIS — K760 Fatty (change of) liver, not elsewhere classified: Secondary | ICD-10-CM | POA: Diagnosis present

## 2015-07-05 DIAGNOSIS — J9601 Acute respiratory failure with hypoxia: Secondary | ICD-10-CM | POA: Diagnosis not present

## 2015-07-05 DIAGNOSIS — Z7952 Long term (current) use of systemic steroids: Secondary | ICD-10-CM | POA: Diagnosis not present

## 2015-07-05 DIAGNOSIS — Z452 Encounter for adjustment and management of vascular access device: Secondary | ICD-10-CM

## 2015-07-05 DIAGNOSIS — Z4659 Encounter for fitting and adjustment of other gastrointestinal appliance and device: Secondary | ICD-10-CM

## 2015-07-05 DIAGNOSIS — J9690 Respiratory failure, unspecified, unspecified whether with hypoxia or hypercapnia: Secondary | ICD-10-CM

## 2015-07-05 DIAGNOSIS — R1115 Cyclical vomiting syndrome unrelated to migraine: Secondary | ICD-10-CM | POA: Insufficient documentation

## 2015-07-05 HISTORY — DX: Acute pancreatitis without necrosis or infection, unspecified: K85.90

## 2015-07-05 HISTORY — DX: Acute kidney failure, unspecified: N17.9

## 2015-07-05 LAB — COMPREHENSIVE METABOLIC PANEL
ALBUMIN: 2.1 g/dL — AB (ref 3.5–5.0)
ALT: 106 U/L — AB (ref 17–63)
AST: 90 U/L — AB (ref 15–41)
Alkaline Phosphatase: 434 U/L — ABNORMAL HIGH (ref 38–126)
Anion gap: 11 (ref 5–15)
BUN: 21 mg/dL — AB (ref 6–20)
CHLORIDE: 112 mmol/L — AB (ref 101–111)
CO2: 17 mmol/L — AB (ref 22–32)
CREATININE: 1.54 mg/dL — AB (ref 0.61–1.24)
Calcium: 8.4 mg/dL — ABNORMAL LOW (ref 8.9–10.3)
GFR calc Af Amer: 54 mL/min — ABNORMAL LOW (ref >60)
GFR, EST NON AFRICAN AMERICAN: 47 mL/min — AB (ref >60)
GLUCOSE: 87 mg/dL (ref 65–99)
Potassium: 4.8 mmol/L (ref 3.5–5.1)
Sodium: 140 mmol/L (ref 135–145)
Total Bilirubin: 1.7 mg/dL — ABNORMAL HIGH (ref 0.3–1.2)
Total Protein: 4.5 g/dL — ABNORMAL LOW (ref 6.5–8.1)

## 2015-07-05 LAB — LIPASE, BLOOD: LIPASE: 336 U/L — AB (ref 11–51)

## 2015-07-05 LAB — CBC
HCT: 39.4 % (ref 39.0–52.0)
Hemoglobin: 13.5 g/dL (ref 13.0–17.0)
MCH: 35.2 pg — AB (ref 26.0–34.0)
MCHC: 34.3 g/dL (ref 30.0–36.0)
MCV: 102.6 fL — AB (ref 78.0–100.0)
PLATELETS: 145 10*3/uL — AB (ref 150–400)
RBC: 3.84 MIL/uL — ABNORMAL LOW (ref 4.22–5.81)
RDW: 17.8 % — AB (ref 11.5–15.5)
WBC: 17 10*3/uL — ABNORMAL HIGH (ref 4.0–10.5)

## 2015-07-05 LAB — ETHANOL: Alcohol, Ethyl (B): <5 mg/dL (ref <5)

## 2015-07-05 LAB — LACTATE DEHYDROGENASE: LDH: 276 U/L — ABNORMAL HIGH (ref 98–192)

## 2015-07-05 LAB — I-STAT CG4 LACTIC ACID, ED
LACTIC ACID, VENOUS: 3.58 mmol/L — AB (ref 0.5–2.0)
Lactic Acid, Venous: 3.89 mmol/L (ref 0.5–2.0)

## 2015-07-05 LAB — I-STAT TROPONIN, ED: Troponin i, poc: 0.02 ng/mL (ref 0.00–0.08)

## 2015-07-05 MED ORDER — NON FORMULARY: 1.0000 | Freq: Every day | Status: DC

## 2015-07-05 MED ORDER — HYDROCORTISONE NA SUCCINATE PF 100 MG IJ SOLR
20.0000 mg | Freq: Every evening | INTRAMUSCULAR | Status: DC
  Administered 2015-07-06 – 2015-07-09 (×4): 20 mg via INTRAVENOUS
  Filled 2015-07-05 (×4): qty 2

## 2015-07-05 MED ORDER — SODIUM CHLORIDE 0.9% FLUSH
10.0000 mL | INTRAVENOUS | Status: DC | PRN
  Administered 2015-07-06 – 2015-07-11 (×4): 10 mL
  Filled 2015-07-05 (×4): qty 40

## 2015-07-05 MED ORDER — HEPARIN (PORCINE) IN NACL 100-0.45 UNIT/ML-% IJ SOLN
1200.0000 [IU]/h | INTRAMUSCULAR | Status: DC
  Administered 2015-07-05: 1200 [IU]/h via INTRAVENOUS
  Filled 2015-07-05 (×2): qty 250

## 2015-07-05 MED ORDER — HYDROMORPHONE HCL 1 MG/ML IJ SOLN
1.0000 mg | Freq: Once | INTRAMUSCULAR | Status: AC
  Administered 2015-07-05: 1 mg via INTRAVENOUS
  Filled 2015-07-05: qty 1

## 2015-07-05 MED ORDER — HYDROMORPHONE HCL 1 MG/ML IJ SOLN
1.0000 mg | INTRAMUSCULAR | Status: DC | PRN
  Administered 2015-07-05 – 2015-07-18 (×34): 1 mg via INTRAVENOUS
  Filled 2015-07-05 (×35): qty 1

## 2015-07-05 MED ORDER — SODIUM CHLORIDE 0.9 % IV BOLUS (SEPSIS): 1000.0000 mL | Freq: Once | INTRAVENOUS | Status: DC

## 2015-07-05 MED ORDER — NON FORMULARY: 100.0000 mg | Freq: Every day | Status: DC

## 2015-07-05 MED ORDER — AZITHROMYCIN 500 MG IV SOLR
INTRAVENOUS | Status: DC
  Filled 2015-07-05 (×9): qty 300

## 2015-07-05 MED ORDER — SODIUM CHLORIDE 0.9 % IV SOLN
INTRAVENOUS | Status: AC
  Administered 2015-07-05: 21:00:00 via INTRAVENOUS

## 2015-07-05 MED ORDER — PROMETHAZINE HCL 25 MG PO TABS: 25.0000 mg | ORAL_TABLET | Freq: Four times a day (QID) | ORAL | Status: DC | PRN

## 2015-07-05 MED ORDER — DEXTROSE 5 % IV SOLN: 500.0000 mg | INTRAVENOUS | Status: DC

## 2015-07-05 MED ORDER — PROMETHAZINE HCL 25 MG/ML IJ SOLN
12.5000 mg | Freq: Four times a day (QID) | INTRAMUSCULAR | Status: DC | PRN
  Filled 2015-07-05 (×2): qty 1

## 2015-07-05 MED ORDER — SODIUM CHLORIDE 0.9 % IV SOLN
INTRAVENOUS | Status: DC
  Administered 2015-07-05: 16:00:00 via INTRAVENOUS

## 2015-07-05 MED ORDER — SODIUM CHLORIDE 0.9% FLUSH
3.0000 mL | Freq: Two times a day (BID) | INTRAVENOUS | Status: DC
  Administered 2015-07-06 – 2015-07-10 (×4): 3 mL via INTRAVENOUS
  Administered 2015-07-10: 10 mL via INTRAVENOUS
  Administered 2015-07-11 – 2015-07-18 (×12): 3 mL via INTRAVENOUS

## 2015-07-05 MED ORDER — HYDROCORTISONE NA SUCCINATE PF 100 MG IJ SOLR
40.0000 mg | Freq: Every day | INTRAMUSCULAR | Status: DC
  Administered 2015-07-06 – 2015-07-09 (×4): 40 mg via INTRAVENOUS
  Filled 2015-07-05 (×4): qty 2

## 2015-07-05 MED ORDER — HYDROMORPHONE HCL 1 MG/ML IJ SOLN: 1.0000 mg | INTRAMUSCULAR | Status: DC | PRN

## 2015-07-05 MED ORDER — ONDANSETRON HCL 4 MG/2ML IJ SOLN
4.0000 mg | Freq: Once | INTRAMUSCULAR | Status: AC
  Administered 2015-07-05: 4 mg via INTRAVENOUS
  Filled 2015-07-05: qty 2

## 2015-07-05 MED ORDER — PROMETHAZINE HCL 25 MG RE SUPP
25.0000 mg | Freq: Four times a day (QID) | RECTAL | Status: DC | PRN
  Filled 2015-07-05: qty 1

## 2015-07-05 MED ORDER — STUDY - INVESTIGATIONAL DRUG SIMPLE RECORD
100.0000 mg | Freq: Every day | Status: DC
  Administered 2015-07-05 – 2015-07-08 (×3): 100 mg via ORAL
  Filled 2015-07-05 (×2): qty 0.02

## 2015-07-05 MED ORDER — LORAZEPAM 2 MG/ML IJ SOLN
0.5000 mg | Freq: Three times a day (TID) | INTRAMUSCULAR | Status: DC | PRN
  Administered 2015-07-07 – 2015-07-09 (×4): 0.5 mg via INTRAVENOUS
  Filled 2015-07-05 (×4): qty 1

## 2015-07-05 MED ORDER — HEPARIN SOD (PORK) LOCK FLUSH 100 UNIT/ML IV SOLN
500.0000 [IU] | Freq: Once | INTRAVENOUS | Status: AC
  Administered 2015-07-05: 500 [IU]

## 2015-07-05 MED ORDER — LORAZEPAM 2 MG/ML IJ SOLN: 1.0000 mg | Freq: Three times a day (TID) | INTRAMUSCULAR | Status: DC | PRN

## 2015-07-05 MED ORDER — AZITHROMYCIN 500 MG IV SOLR
INTRAVENOUS | Status: DC
  Administered 2015-07-05 – 2015-07-08 (×4): via INTRAVENOUS
  Filled 2015-07-05 (×5): qty 300

## 2015-07-05 MED ORDER — SODIUM CHLORIDE 0.9 % IV BOLUS (SEPSIS)
2000.0000 mL | Freq: Once | INTRAVENOUS | Status: AC
  Administered 2015-07-05: 2000 mL via INTRAVENOUS

## 2015-07-05 MED ORDER — TIGECYCLINE 50 MG IV SOLR
50.0000 mg | Freq: Two times a day (BID) | INTRAVENOUS | Status: DC
  Administered 2015-07-05 – 2015-07-08 (×6): 50 mg via INTRAVENOUS
  Filled 2015-07-05 (×7): qty 50

## 2015-07-05 NOTE — ED Provider Notes (Signed)
CSN: [id number]     Arrival date & time 07/16/2015  1104 History   First MD Initiated Contact with Patient 07/12/2015 1148     Chief Complaint  Patient presents with  . Abdominal Pain   HPI Comments: Patient reports that he developed worsening abdominal pain over the last 2 days.  He reports this pain as being in the center of his belly and similar to the pain he had when he had pancreatitis.  He noticed decreased ostomy output last evening.  His wife reports that she did give him 3 tablets of Imodium for excessive ostomy output but that he normally just slows down and doesn't completely stop.  Denies fevers, sick contacts, cough, congestion, SOB.  Endorses nausea, vomiting x1 day and chills (has this at baseline).  She notes that his stool is always red colored on the abx so she is unsure of GI bleeding.  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  She notes that he has had chronic abdominal pain since being on these medications.  Patient has seen a GI Dr at [facility].  Had a CT abd done that showed no explanation for abdominal pain.  Patient is allergic to IVP dye (even with premedication) but is able to tolerate oral contrast.  Additionally, patient takes blood thinners for h/o DVT in LE.  The history is provided by the patient and the spouse. No language interpreter was used.    Past Medical History  Diagnosis Date  . Crohn's disease (HCC)   . GERD (gastroesophageal reflux disease)   . Anxiety   . 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 PICC (peripherally inserted central catheter) in place     right arm  . Osteomyelitis of hand, left, acute (HCC)   . Hearing loss   . Pancreatitis 10/2014  . History of kidney stones   . History of pancreatitis 11/06/2014  . Mycobacterial infection     left hand/wrist  . Thin skin   . Septic arthritis of wrist, left (HCC)   . DVT (deep venous thrombosis) (HCC) 04/2015    left leg  .  Pneumonia 04/2015   Past Surgical History  Procedure Laterality Date  . Colonoscopy    . Appendectomy  ~ 2009  . Ileostomy closure N/A 10/23/2013    Procedure: ILEOSTOMY TAKEDOWN, REPAIR OF PARASTOMAL HERNIA.;  Surgeon: Cherylynn Ridges, MD;  Location: [facility];  Service: General;  Laterality: N/A;  . Wrist arthroscopy with debridement Left 05/24/2014    Procedure: LEFT ARTHROSCOPY WRIST CULTURE/BIOPSY DEBRIDEMENT;  Surgeon: Cindee Salt, MD;  Location: [facility];  Service: Orthopedics;  Laterality: Left;  . Esophagogastroduodenoscopy N/A 06/15/2014    Procedure: ESOPHAGOGASTRODUODENOSCOPY (EGD);  Surgeon: Malissa Hippo, MD;  Location: [facility];  Service: Endoscopy;  Laterality: N/A;  . Cholecystectomy  ~ 2008  . Parastomal hernia repair  10/2013  . Incision and drainage abscess Left 06/22/2014    Procedure: INCISION, DEBRIDEMENT AND IRRIGATION LEFT HAND/WRIST;  Surgeon: Cindee Salt, MD;  Location: [facility];  Service: Orthopedics;  Laterality: Left;  . Incision and drainage of wound Left 07/27/2014    Procedure: IRRIGATION AND DEBRIDEMENT WOUND;  Surgeon: Cindee Salt, MD;  Location: [facility];  Service: Orthopedics;  Laterality: Left;  . Incision and drainage abscess Left 08/09/2014    Procedure: INCISION AND DRAINAGE LEFT WRIST;  Surgeon: Cindee Salt, MD;  Location: [facility];  Service: Orthopedics;  Laterality: Left;  . Incision and drainage abscess Left 09/05/2014    Procedure: INCISION AND DRAINAGE LEFT HAND;  Surgeon: Cindee Salt, MD;  Location: [facility];  Service: Orthopedics;  Laterality: Left;  . I&d extremity Left 10/02/2014    Procedure: IRRIGATION AND DEBRIDEMENT  LEFT HAND;  Surgeon: Cindee Salt, MD;  Location: [facility];  Service: Orthopedics;  Laterality: Left;  . I&d extremity Left 10/12/2014    Procedure: MINOR IRRIGATION AND DEBRIDEMENT LEFT HAND;  Surgeon: Cindee Salt, MD;  Location: [facility];  Service: Orthopedics;  Laterality: Left;  . Incision and drainage abscess Left 11/12/2014    Procedure: INCISION AND DRAINAGE ABSCESS LEFT HAND;  Surgeon: Cindee Salt, MD;  Location: [facility];  Service: Orthopedics;  Laterality: Left;  . Incision and drainage Left 11/27/2014    Procedure: INCISION AND DRAINAGE LEFT HAND;  Surgeon: Cindee Salt, MD;  Location: [facility];  Service: Orthopedics;  Laterality: Left;  . Tonsillectomy  1963  . Colon surgery  ~ 2010    for a colonic stricture at the anastomosis from a colostomy reversal/notes 10/03/2013  . Parastomal hernia repair  11/02/2013  . Orif distal radius fracture Right 06/29/2006  . Ileostomy    . Ileoscopy  07/02/2010  . I&d extremity Left 01/01/2015    Procedure: IRRIGATION AND DEBRIDEMENT LEFT HAND/WRIST;  Surgeon: Cindee Salt, MD;  Location:  SUR[facility]ERY [facility];  Service: Orthopedics;  Laterality: Left;   No family history on file. Social History  Substance Use Topics  . Smoking status: Former Smoker -- 30 years    Quit date: 05/31/2006  . Smokeless tobacco: Never Used  . Alcohol Use: 0.0 oz/week    0 Standard drinks or equivalent per week     Comment: rare    Review of Systems  Constitutional: Positive for chills. Negative for fever.  HENT: Negative for rhinorrhea and sore throat.   Eyes: Negative for photophobia and visual disturbance.  Respiratory: Negative for cough, shortness of breath and wheezing.   Cardiovascular: Negative for chest pain.  Gastrointestinal: Positive for nausea, vomiting and abdominal pain. Negative for diarrhea.  Musculoskeletal: Positive for back pain.  Skin: Negative for rash.  Neurological: Negative for headaches.   Allergies  Amikacin; Cefoxitin; Fish allergy; Iodine; Ivp dye; Rifampin; Shellfish allergy; Vancomycin; and Demerol  Home Medications   Prior to Admission medications   Medication Sig Start Date End Date Taking? Authorizing Provider   ALPRAZolam (XANAX) 0.5 MG tablet TAKE 1 TABLET BY MOUTH three times  A DAY AS NEEDED FOR ANXIETY 05/28/15   Malissa Hippo, MD  Ascorbic Acid (VITAMIN C) 1000 MG tablet Take 1,000 mg by mouth daily.    Historical Provider, MD  azithromycin (ZITHROMAX) 600 MG tablet Take 1 tablet (600 mg total) by mouth daily. 11/09/14   Marticia Reifschneider Hulen Skains, DO  azithromycin (ZITHROMAX) 600 MG tablet TAKE 1 TABLET (600 MG TOTAL) BY MOUTH DAILY. 07/04/15   Randall Hiss, MD  Calcium Carbonate-Vitamin D (CALCIUM 600 + D PO) Take 1 tablet by mouth daily.     Historical Provider, MD  CLOFAZIMINE PO Take 100 mg by mouth at bedtime.     Historical Provider, MD  feeding supplement, ENSURE ENLIVE, (ENSURE ENLIVE) LIQD Take 237 mLs by mouth 3 (three) times daily. 05/13/15   Calvert Cantor, MD  folic acid (FOLVITE) 1 MG tablet Take 1 mg by mouth daily.  Historical Provider, MD  furosemide (LASIX) 40 MG tablet Reported on 05/28/2015 03/14/15   Historical Provider, MD  HYDROcodone-acetaminophen (NORCO) 10-325 MG tablet Take 1 tablet by mouth every 6 (six) hours as needed. 05/28/15   Malissa Hippo, MD  levalbuterol (XOPENEX) 1.25 MG/0.5ML nebulizer solution Take 1.25 mg by nebulization every 4 (four) hours as needed for wheezing or shortness of breath. 05/13/15   Calvert Cantor, MD  loperamide (IMODIUM A-D) 2 MG tablet Take 8 mg by mouth 2 (two) times daily as needed for diarrhea or loose stools.    Historical Provider, MD  mirtazapine (REMERON SOL-TAB) 15 MG disintegrating tablet Take 1 tablet (15 mg total) by mouth at bedtime. 06/06/15   Randall Hiss, MD  Multiple Vitamins-Minerals (MULTIVITAMIN PO) Take 1 tablet by mouth daily.     Historical Provider, MD  nystatin (MYCOSTATIN/NYSTOP) 100000 UNIT/GM POWD Apply topically daily as needed (FOR IRRITATION). PRN 03/13/14   Historical Provider, MD  pantoprazole (PROTONIX) 40 MG tablet Take 1 tablet (40 mg total) by mouth daily before breakfast. 01/15/15   Malissa Hippo, MD   predniSONE (DELTASONE) 5 MG tablet Take 2 tablets (10 mg total) by mouth daily with breakfast. 05/28/15   Malissa Hippo, MD  promethazine (PHENERGAN) 25 MG tablet Take 1 tablet (25 mg total) by mouth as needed for nausea or vomiting. 12/07/14   Len Blalock, NP  tigecycline 50 mg in sodium chloride 0.9 % 100 mL Inject 50 mg into the vein every 12 (twelve) hours. 05/13/15   Calvert Cantor, MD  XARELTO 20 MG TABS tablet Take 20 mg by mouth daily. 03/04/15   Historical Provider, MD  Zinc 50 MG CAPS Take 1 capsule by mouth daily.     Historical Provider, MD   BP 134/89 mmHg  Pulse 91  Temp(Src) 97.7 F (36.5 C) (Oral)  Resp 18  SpO2 100% Physical Exam  Constitutional: He is oriented to person, place, and time. He appears well-developed and well-nourished. No distress.  Actively vomiting in room. nonbloody nonbilious  HENT:  Head: Normocephalic and atraumatic.  Eyes: No scleral icterus.  Neck: Normal range of motion. Neck supple.  Cardiovascular: Normal rate, regular rhythm and normal heart sounds.   No murmur heard. Pulmonary/Chest: Effort normal and breath sounds normal. No respiratory distress.  Abdominal: Soft. He exhibits no distension and no mass. There is tenderness (perumbilical). There is no rebound and no guarding.  Hypoactive bowel sounds, ostomy without surrounding erythema or evidence of infection.  Scant red tinged stool in bag  Musculoskeletal: Normal range of motion. He exhibits no edema.  Neurological: He is alert and oriented to person, place, and time.  Cannot hear (2/2 ototoxicity).  But responds appropriately to questions.  Speech normal.  Skin: Skin is warm and dry. No rash noted. He is not diaphoretic.    ED Course  Procedures (including critical care time) Labs Review Labs Reviewed  LIPASE, BLOOD - Abnormal; Notable for the following:    Lipase 336 (*)    All other components within normal limits  COMPREHENSIVE METABOLIC PANEL - Abnormal; Notable for the  following:    Chloride 112 (*)    CO2 17 (*)    BUN 21 (*)    Creatinine, Ser 1.54 (*)    Calcium 8.4 (*)    Total Protein 4.5 (*)    Albumin 2.1 (*)    AST 90 (*)    ALT 106 (*)    Alkaline Phosphatase 434 (*)  Total Bilirubin 1.7 (*)    GFR calc non Af Amer 47 (*)    GFR calc Af Amer 54 (*)    All other components within normal limits  CBC - Abnormal; Notable for the following:    WBC 17.0 (*)    RBC 3.84 (*)    MCV 102.6 (*)    MCH 35.2 (*)    RDW 17.8 (*)    Platelets 145 (*)    All other components within normal limits  I-STAT CG4 LACTIC ACID, ED - Abnormal; Notable for the following:    Lactic Acid, Venous 3.89 (*)    All other components within normal limits  URINALYSIS, ROUTINE W REFLEX MICROSCOPIC (NOT AT [facility])  LACTATE DEHYDROGENASE  URINE RAPID DRUG SCREEN, HOSP PERFORMED  ETHANOL  I-STAT TROPOININ, ED    Imaging Review Dg Chest 2 View  07/15/2015  CLINICAL DATA:  Chest pain. EXAM: CHEST  2 VIEW COMPARISON:  June 10, 2015 FINDINGS: The heart size and mediastinal contours are within normal limits. Both lungs are clear. Minimal left pleural effusion is noted. No pneumothorax is noted. Right-sided PICC line is unchanged in position. 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 IMPRESSION: Minimal left pleural effusion. No acute cardiopulmonary abnormality seen. Electronically Signed   By: Lupita Raider, M.D.   On: 07/02/2015 12:04   I have personally reviewed and evaluated these images and lab results as part of my medical decision-making.   EKG Interpretation None      MDM   Final diagnoses:  Acute pancreatitis, unspecified complication status, unspecified pancreatitis type    DAYLIN GRUSZKA is a 65 y.o. male that presents to ED with worsening abdominal pain and decreased ostomy output.  He has a leukocytosis, lactic acidosis, elevated Lipase and AKI.  LDH ordered.  Patient given Dilaudid, Zofran and 2L NS bolus.   Medicine service was called for admission for acute pancreatitis, meets SIRS.  VS currently stable.  Patient afebrile.  No evidence of sepsis at this time.  AKI likely from vomiting/ dehydration.  Internal Medicine Teaching service to admit.    Raliegh Ip, DO 06/19/2015 1344  Lyndal Pulley, MD 07/07/15 [phone]

## 2015-07-05 NOTE — ED Notes (Signed)
Admitting MD at the bedside.  

## 2015-07-05 NOTE — Consult Note (Signed)
ANTICOAGULATION CONSULT NOTE - Initial Consult  Pharmacy Consult for heparin Indication: DVT  Patient Measurements: Weight: 156 lb 1.4 oz (70.8 kg) Heparin Dosing Weight: 70.8 kg  Labs:  Recent Labs  06/25/2015 1125  HGB 13.5  HCT 39.4  PLT 145*  CREATININE 1.54*   Estimated Creatinine Clearance: 49.8 mL/min (by C-G formula based on Cr of 1.54).  Assessment: 65 yo male w/ hx of DVT diagnosed 04/2015. Pt is on Xarelto PTA but is currently NPO. Heparin to be started for DVT.  Goal of Therapy:  Heparin level 0.3-0.7 units/ml Monitor platelets by anticoagulation protocol: Yes   Plan:  Start heparin infusion at 1200 units/hr Check anti-Xa level in 6 hours and daily while on heparin Continue to monitor H&H and platelets  Greggory Stallion, PharmD Clinical Pharmacy Resident Pager # [phone] 06/24/2015 6:00 PM

## 2015-07-05 NOTE — ED Notes (Signed)
Pt reports abd pain which radiates to the chest intermittently. Pt reports nausea and vomiting multiple times since last night. Pts wife reports multiple antibiotics used over the last year do to bone infections.

## 2015-07-05 NOTE — ED Notes (Signed)
Attempted to call report

## 2015-07-05 NOTE — ED Notes (Signed)
Provider at the bedside.  

## 2015-07-05 NOTE — Telephone Encounter (Signed)
Talked with [name], patient's wife. Questions if the patient may have a small bowel blockage. There is no out put in the Ostomy bag. Patient has had 2 days of bad diarrhea. He was given Lomotil up to 3 times a day. Patient started throwing up last night , he is afebrile. He is complaining of abdominal pain.  Per Dr.Rehman the patient should go to the ED for further evaluation. [name] was called and made aware. She states that she will go to [place].

## 2015-07-05 NOTE — H&P (Signed)
Date: 06/30/2015               Patient Name:  Lee Klein MRN: [id number]  DOB: November 14, 1950 Age / Sex: 65 y.o., male   PCP: [name], MD         Medical Service: Internal Medicine Teaching Service         Attending Physician: Dr. Doneen Poisson, MD    First Contact: Dr. Reubin Milan Pager: [phone]  Second Contact: Dr. 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 He says that for the past 2 months, he has had daily intermittent epigastric abdominal pain and nausea with occasional vomiting. Yesterday, he had the same abdominal pain with some vomiting after breakfast and lunch, and his wife gave him lomotil 3 times yesterday because he was having increased ostomy output. After lunchtime yesterday, he had no more ostomy output. He tried eating pizza last night, and then awoke around midnight with worsened epigastric pain radiating to the back as well as much more frequent vomiting. His wife, a Press photographer, then gave him some miralax to help increase his ostomy output, which has. He continued to have the pain and vomiting, worse than his baseline, so he came in to the ED.  Meds: Current Facility-Administered Medications  Medication Dose Route Frequency Provider Last Rate Last Dose  . 0.9 %  sodium chloride infusion   Intravenous Continuous Yolanda Manges, DO       Current  Outpatient Prescriptions  Medication Sig Dispense Refill  . ALPRAZolam (XANAX) 0.5 MG tablet TAKE 1 TABLET BY MOUTH three times  A DAY AS NEEDED FOR ANXIETY 90 tablet 2  . Ascorbic Acid (VITAMIN C) 1000 MG tablet Take 1,000 mg by mouth daily.    Marland Kitchen azithromycin (ZITHROMAX) 600 MG tablet Take 1 tablet (600 mg total) by mouth daily. 7 tablet 0  . azithromycin (ZITHROMAX) 600 MG tablet TAKE 1 TABLET (600 MG TOTAL) BY MOUTH DAILY. 30 tablet 10  . Calcium Carbonate-Vitamin D (CALCIUM 600 + D PO) Take 1 tablet by mouth daily.     . CLOFAZIMINE PO Take 100 mg by mouth at bedtime.     . feeding supplement, ENSURE ENLIVE, (ENSURE ENLIVE) LIQD Take 237 mLs by mouth 3 (three) times daily. 237 mL 12  . folic acid (FOLVITE) 1 MG tablet Take 1 mg by mouth daily.    . furosemide (LASIX) 40 MG tablet Reported on 05/28/2015  3  . HYDROcodone-acetaminophen (NORCO) 10-325 MG tablet Take 1 tablet by mouth every 6 (six) hours as needed. 120 tablet 0  . levalbuterol (XOPENEX) 1.25 MG/0.5ML nebulizer solution Take 1.25 mg by nebulization every 4 (four) hours as needed for wheezing or shortness of breath. 1 each 12  . loperamide (IMODIUM A-D) 2 MG tablet Take 8 mg by mouth 2 (two) times daily as needed for diarrhea or loose stools.    . mirtazapine (REMERON SOL-TAB) 15 MG disintegrating tablet Take 1  tablet (15 mg total) by mouth at bedtime. 30 tablet 11  . Multiple Vitamins-Minerals (MULTIVITAMIN PO) Take 1 tablet by mouth daily.     Marland Kitchen nystatin (MYCOSTATIN/NYSTOP) 100000 UNIT/GM POWD Apply topically daily as needed (FOR IRRITATION). PRN  1  . pantoprazole (PROTONIX) 40 MG tablet Take 1 tablet (40 mg total) by mouth daily before breakfast. 90 tablet 3  . predniSONE (DELTASONE) 5 MG tablet Take 2 tablets (10 mg total) by mouth daily with breakfast. 180 tablet 0  . promethazine (PHENERGAN) 25 MG tablet Take 1 tablet (25 mg total) by mouth as needed for nausea or vomiting. 60 tablet 2  . tigecycline 50 mg in sodium chloride  0.9 % 100 mL Inject 50 mg into the vein every 12 (twelve) hours. 1 vial 0  . XARELTO 20 MG TABS tablet Take 20 mg by mouth daily.  5  . Zinc 50 MG CAPS Take 1 capsule by mouth daily.       Allergies: Allergies as of 06/28/2015 - Review Complete 07/13/2015  Allergen Reaction Noted  . Amikacin Other (See Comments) 11/08/2014  . Cefoxitin Nausea Only 11/12/2014  . Fish allergy Nausea And Vomiting 10/11/2013  . Iodine Nausea Only 11/06/2014  . Ivp dye [iodinated diagnostic agents] Nausea Only 06/01/2011  . Rifampin Nausea And Vomiting and Other (See Comments) 06/19/2014  . Shellfish allergy Swelling 10/23/2013  . Vancomycin Other (See Comments) 01/15/2015  . Demerol Rash 06/01/2011   Past Medical History  Diagnosis Date  . Crohn's disease (HCC)   . GERD (gastroesophageal reflux disease)   . Anxiety   . 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 PICC (peripherally inserted central catheter) in place     right arm  . Osteomyelitis of hand, left, acute (HCC)   . Hearing loss   . Pancreatitis 10/2014  . History of kidney stones   . History of pancreatitis 11/06/2014  . Mycobacterial infection     left hand/wrist  . Thin skin   . Septic arthritis of wrist, left (HCC)   . DVT (deep venous thrombosis) (HCC) 04/2015    left leg  . Pneumonia 04/2015   Past Surgical History  Procedure Laterality Date  . Colonoscopy    . Appendectomy  ~ 2009  . Ileostomy closure N/A 10/23/2013    Procedure: ILEOSTOMY TAKEDOWN, REPAIR OF PARASTOMAL HERNIA.;  Surgeon: Cherylynn Ridges, MD;  Location: [facility];  Service: General;  Laterality: N/A;  . Wrist arthroscopy with debridement Left 05/24/2014    Procedure: LEFT ARTHROSCOPY WRIST CULTURE/BIOPSY DEBRIDEMENT;  Surgeon: Cindee Salt, MD;  Location: [facility];  Service: Orthopedics;  Laterality: Left;  . Esophagogastroduodenoscopy N/A 06/15/2014    Procedure: ESOPHAGOGASTRODUODENOSCOPY (EGD);  Surgeon: Malissa Hippo,  MD;  Location: [facility];  Service: Endoscopy;  Laterality: N/A;  . Cholecystectomy  ~ 2008  . Parastomal hernia repair  10/2013  . Incision and drainage abscess Left 06/22/2014    Procedure: INCISION, DEBRIDEMENT AND IRRIGATION LEFT HAND/WRIST;  Surgeon: Cindee Salt, MD;  Location: [facility];  Service: Orthopedics;  Laterality: Left;  . Incision and drainage of wound Left 07/27/2014    Procedure: IRRIGATION AND DEBRIDEMENT WOUND;  Surgeon: Cindee Salt, MD;  Location: [facility];  Service: Orthopedics;  Laterality: Left;  . Incision and drainage abscess Left 08/09/2014    Procedure: INCISION AND DRAINAGE LEFT WRIST;  Surgeon: Cindee Salt, MD;  Location: [facility];  Service: Orthopedics;  Laterality: Left;  . Incision and drainage abscess Left 09/05/2014    Procedure: INCISION AND DRAINAGE LEFT HAND;  Surgeon: Cindee Salt, MD;  Location: [facility];  Service: Orthopedics;  Laterality: Left;  . I&d extremity Left 10/02/2014    Procedure: IRRIGATION AND DEBRIDEMENT  LEFT HAND;  Surgeon: Cindee Salt, MD;  Location: [facility];  Service: Orthopedics;  Laterality: Left;  . I&d extremity Left 10/12/2014    Procedure: MINOR IRRIGATION AND DEBRIDEMENT LEFT HAND;  Surgeon: Cindee Salt, MD;  Location: [facility];  Service: Orthopedics;  Laterality: Left;  . Incision and drainage abscess Left 11/12/2014    Procedure: INCISION AND DRAINAGE ABSCESS LEFT HAND;  Surgeon: Cindee Salt, MD;  Location: [facility];  Service: Orthopedics;  Laterality: Left;  . Incision and drainage Left 11/27/2014    Procedure: INCISION AND DRAINAGE LEFT HAND;  Surgeon: Cindee Salt, MD;  Location: [facility];  Service: Orthopedics;  Laterality: Left;  . Tonsillectomy  1963  . Colon surgery  ~ 2010    for a colonic stricture at the anastomosis from a colostomy reversal/notes 10/03/2013  . Parastomal hernia repair  11/02/2013  .  Orif distal radius fracture Right 06/29/2006  . Ileostomy    . Ileoscopy  07/02/2010  . I&d extremity Left 01/01/2015    Procedure: IRRIGATION AND DEBRIDEMENT LEFT HAND/WRIST;  Surgeon: Cindee Salt, MD;  Location: [facility];  Service: Orthopedics;  Laterality: Left;   No family history on file. Social History   Social History  . Marital Status: Married    Spouse Name: N/A  . Number of Children: N/A  . Years of Education: N/A   Occupational History  . Not on file.   Social History Main Topics  . Smoking status: Former Smoker -- 30 years    Quit date: 05/31/2006  . Smokeless tobacco: Never Used  . Alcohol Use: 0.0 oz/week    0 Standard drinks or equivalent per week     Comment: rare  . Drug Use: No  . Sexual Activity:    Partners: Female    Copy: None   Other Topics Concern  . Not on file   Social History Narrative   Review of Systems: Pertinent items noted in HPI and remainder of comprehensive ROS otherwise negative.  Physical Exam: Blood pressure 125/104, pulse 82, temperature 97.7 F (36.5 C), temperature source Oral, resp. rate 18, SpO2 100 %.   Gen: Chronically ill-appearing, alert and oriented to person, place, and time.  HEENT: Oropharynx clear without erythema or exudate.  Neck: No cervical LAD, no thyromegaly or nodules, no JVD noted. CV: Normal rate, regular rhythm, no murmurs, rubs, or gallops Pulmonary: Normal effort, CTA bilaterally, no crackles or wheezes Abdominal: Soft, moderate tenderness to palpation particularly in epigastrum without rebound or guarding. Hypoactive bowel sounds auscultated. Ostomy site c/d/i with pink-colored output.  Extremities: Distal pulses 2+ in upper and lower extremities bilaterally, no tenderness or erythema. 2+ pitting edema in LLE>RLE. PICC in R arm has some darkish discoloration around the adhesive site, but PICC insertion site is c/d/i, without any surrounding erythema, warmth, edema, or  tenderness. Neuro: CN II-XII grossly intact, no focal weakness or sensory deficits noted Skin: No atypical appearing moles. Hyperpigmentation particularly in the arms and face. Increased adipose tissue in the posterior neck. Skin very thin/fragile throughout with evidence of previously-healed tears.  Lab results: Basic Metabolic Panel:  Recent Labs  34/74/25 1125  NA  140  K 4.8  CL 112*  CO2 17*  GLUCOSE 87  BUN 21*  CREATININE 1.54*  CALCIUM 8.4*   Liver Function Tests:  Recent Labs  Jul 23, 2015 1125  AST 90*  ALT 106*  ALKPHOS 434*  BILITOT 1.7*  PROT 4.5*  ALBUMIN 2.1*    Recent Labs  08/13/2015 1125  LIPASE 336*   CBC:  Recent Labs  08/01/2015 1125  WBC 17.0*  HGB 13.5  HCT 39.4  MCV 102.6*  PLT 145*   Imaging results:  Dg Chest 2 View  07/30/2015  CLINICAL DATA:  Chest pain. EXAM: CHEST  2 VIEW COMPARISON:  June 10, 2015 FINDINGS: The heart size and mediastinal contours are within normal limits. Both lungs are clear. Minimal left pleural effusion is noted. No pneumothorax is noted. Right-sided PICC line is unchanged in position. 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 IMPRESSION: Minimal left pleural effusion. No acute cardiopulmonary abnormality seen. Electronically Signed   By: Lupita Raider, M.D.   On: 08/12/2015 12:04   Other results: EKG: normal sinus rhythm, L posterior fascicular block.  Assessment & Plan by Problem: Active Problems:   Pancreatitis 1. Epigastric pain and vomiting - acute worsening of chronic epigastric pain and intermittent vomiting. Pt afebrile, VSS, WBC 18, Cr mildly increased 1.5 from baseline 0.8-1.0. Lipase 336, ALP 434, AST/ALT elevated along with Tbili 1.7 and lactate 3.9. EKG and troponins reassuring. CXR clear. While ALP/LFTs, Tbili all chronically increased, lipase previosly normalized since last admitted for pancreatitis 10/2014, thought to be due to cefoxitin. Seen by GI last month,  CT abdomen 1/11 showed small peristomal hernia that was not believed to account for his pain without any other findings. Abd [name] here with possible pancreatic inflammatory changes, hepatic steatosis, s/p cholecystectomy w/o CBD dilation. This is most likely pancreatitis related to his antibiotics, CBD stone less likely given h/o cholecystectomy and AUS findings, most concerning is tigecycline, but 3 doses of lomotil yesterday may have exacerbated this as well. Less likely obstruction despite Crohn's given negative KUB and resumed ostomy output.  -NPO -Dilaudid 1mg  q 3hrs PRN for pain -Phenergan PRN for nausea -IV NS boluses PRN, IVNS @ 100/hr -Will need to discuss with ID re: abx recs -Trend CMP, CBC, lactate  2. 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 S/p 14 different surgeries, last August 2016, appears that he needs to be on abx for 1 year after surgery. Has been on-and-off several antibiotics but current regimen is azithromycin, tigecycline, and clofazimine (obtained from CBC). Complicated by Serratia bacteremia 12/16 most likely from PICC. Hand is improving greatly and he is continuing to use it. -Azithromycin IV (takes 600 PO at home, consulted pharm to help with formulating a 600mg  IV), tigecycline IV -Notified wife to bring clofazimine from home (not on formulary, CDC provided). There is no IV formulation so will continue PO.  3. Crohn's disease - dx 40 years ago, treated with daily prednisone for decades, s/p several bowel resections, now with ostomy. Complicated by multiple SBOs in past as well as adrenal insufficiency when trying to taper off prednisone. Now only on prednisone 10mg  daily. No DMARDs; patient is well-controlled and has had no Crohn's flares in years. -Hydrocortisone IV 40mg  AM, 20mg  PM (pred 10 converts to 40, but will give mild stress dose) -Ostomy output expected to be pinkish-red as a side-effect of  clofazimine  4. Provoked DVT of LLE -  diagnosed 02/2015; on Xarelto -Continue Xarelto 20mg  QD  PPX - DVT - Heparin  Dispo: Disposition is deferred at this time, awaiting improvement of current medical problems. Anticipated discharge in approximately 1-3 day(s).   The patient does have a current PCP [name], MD) and does need an [facility] hospital follow-up appointment after discharge.  Signed: Darrick Huntsman, MD 07/06/2015, 2:45 PM

## 2015-07-05 NOTE — ED Notes (Signed)
Spouse  [phone]

## 2015-07-06 DIAGNOSIS — E2740 Unspecified adrenocortical insufficiency: Secondary | ICD-10-CM

## 2015-07-06 DIAGNOSIS — Z932 Ileostomy status: Secondary | ICD-10-CM

## 2015-07-06 LAB — LIPID PANEL
CHOL/HDL RATIO: 2 ratio
CHOLESTEROL: 55 mg/dL (ref 0–200)
HDL: 27 mg/dL — ABNORMAL LOW (ref >40)
LDL Cholesterol: 22 mg/dL (ref 0–99)
TRIGLYCERIDES: 28 mg/dL (ref <150)
VLDL: 6 mg/dL (ref 0–40)

## 2015-07-06 LAB — URINALYSIS, ROUTINE W REFLEX MICROSCOPIC
GLUCOSE, UA: NEGATIVE mg/dL
HGB URINE DIPSTICK: NEGATIVE
Ketones, ur: NEGATIVE mg/dL
Nitrite: POSITIVE — AB
PH: 5.5 (ref 5.0–8.0)
Protein, ur: NEGATIVE mg/dL
SPECIFIC GRAVITY, URINE: 1.02 (ref 1.005–1.030)

## 2015-07-06 LAB — CBC WITH DIFFERENTIAL/PLATELET
Basophils Absolute: 0 10*3/uL (ref 0.0–0.1)
Basophils Relative: 0 %
EOS ABS: 0 10*3/uL (ref 0.0–0.7)
Eosinophils Relative: 0 %
HCT: 34.5 % — ABNORMAL LOW (ref 39.0–52.0)
HEMOGLOBIN: 11.7 g/dL — AB (ref 13.0–17.0)
LYMPHS ABS: 0.9 10*3/uL (ref 0.7–4.0)
Lymphocytes Relative: 5 %
MCH: 34.6 pg — AB (ref 26.0–34.0)
MCHC: 33.9 g/dL (ref 30.0–36.0)
MCV: 102.1 fL — ABNORMAL HIGH (ref 78.0–100.0)
Monocytes Absolute: 1.8 10*3/uL — ABNORMAL HIGH (ref 0.1–1.0)
Monocytes Relative: 10 %
NEUTROS ABS: 14.2 10*3/uL — AB (ref 1.7–7.7)
NEUTROS PCT: 84 %
Platelets: 108 10*3/uL — ABNORMAL LOW (ref 150–400)
RBC: 3.38 MIL/uL — AB (ref 4.22–5.81)
RDW: 17.9 % — ABNORMAL HIGH (ref 11.5–15.5)
WBC: 16.8 10*3/uL — AB (ref 4.0–10.5)

## 2015-07-06 LAB — COMPREHENSIVE METABOLIC PANEL
ALT: 76 U/L — AB (ref 17–63)
ANION GAP: 8 (ref 5–15)
AST: 61 U/L — ABNORMAL HIGH (ref 15–41)
Albumin: 1.6 g/dL — ABNORMAL LOW (ref 3.5–5.0)
Alkaline Phosphatase: 333 U/L — ABNORMAL HIGH (ref 38–126)
BUN: 18 mg/dL (ref 6–20)
CHLORIDE: 114 mmol/L — AB (ref 101–111)
CO2: 19 mmol/L — ABNORMAL LOW (ref 22–32)
CREATININE: 1.39 mg/dL — AB (ref 0.61–1.24)
Calcium: 7.6 mg/dL — ABNORMAL LOW (ref 8.9–10.3)
GFR, EST NON AFRICAN AMERICAN: 53 mL/min — AB (ref >60)
Glucose, Bld: 60 mg/dL — ABNORMAL LOW (ref 65–99)
POTASSIUM: 4.8 mmol/L (ref 3.5–5.1)
Sodium: 141 mmol/L (ref 135–145)
Total Bilirubin: 1.7 mg/dL — ABNORMAL HIGH (ref 0.3–1.2)
Total Protein: 3.7 g/dL — ABNORMAL LOW (ref 6.5–8.1)

## 2015-07-06 LAB — URINE MICROSCOPIC-ADD ON
BACTERIA UA: NONE SEEN
RBC / HPF: NONE SEEN RBC/hpf (ref 0–5)
Squamous Epithelial / LPF: NONE SEEN

## 2015-07-06 LAB — LACTIC ACID, PLASMA: LACTIC ACID, VENOUS: 1.7 mmol/L (ref 0.5–2.0)

## 2015-07-06 LAB — RAPID URINE DRUG SCREEN, HOSP PERFORMED
Amphetamines: NOT DETECTED
Barbiturates: NOT DETECTED
Benzodiazepines: POSITIVE — AB
Cocaine: NOT DETECTED
Opiates: POSITIVE — AB
Tetrahydrocannabinol: NOT DETECTED

## 2015-07-06 LAB — APTT: aPTT: >200 seconds (ref 24–37)

## 2015-07-06 LAB — HEPARIN LEVEL (UNFRACTIONATED): Heparin Unfractionated: 2.18 IU/mL — ABNORMAL HIGH (ref 0.30–0.70)

## 2015-07-06 MED ORDER — ENOXAPARIN SODIUM 40 MG/0.4ML ~~LOC~~ SOLN
40.0000 mg | SUBCUTANEOUS | Status: DC
  Administered 2015-07-06 – 2015-07-08 (×3): 40 mg via SUBCUTANEOUS
  Filled 2015-07-06 (×3): qty 0.4

## 2015-07-06 MED ORDER — SODIUM CHLORIDE 0.9 % IV SOLN
INTRAVENOUS | Status: DC
  Administered 2015-07-06 (×2): via INTRAVENOUS

## 2015-07-06 MED ORDER — HEPARIN (PORCINE) IN NACL 100-0.45 UNIT/ML-% IJ SOLN
1000.0000 [IU]/h | INTRAMUSCULAR | Status: DC
Start: 2015-07-06 — End: 2015-07-06
  Filled 2015-07-06: qty 250

## 2015-07-06 MED ORDER — PNEUMOCOCCAL VAC POLYVALENT 25 MCG/0.5ML IJ INJ
0.5000 mL | INJECTION | INTRAMUSCULAR | Status: DC
  Filled 2015-07-06: qty 0.5

## 2015-07-06 NOTE — Progress Notes (Signed)
PTT greater than 200, MD informed.  Working with Pharmacist, stopped heparin at 0730, will restart at 0830 at 10.  Pink arm band placed on left arm.

## 2015-07-06 NOTE — Progress Notes (Signed)
Utilization review completed.  

## 2015-07-06 NOTE — Progress Notes (Signed)
ANTICOAGULATION CONSULT NOTE - Follow Up Consult  Pharmacy Consult for Heparin (Xarelto on hold) Indication: Hx DVT  Patient Measurements: Height: 5\' 10"  (177.8 cm) Weight: 156 lb 1.4 oz (70.8 kg) IBW/kg (Calculated) : 73  Vital Signs: Temp: 98.2 F (36.8 C) (02/18 0534) Temp Source: Oral (02/18 0534) BP: 143/75 mmHg (02/18 0534) Pulse Rate: 100 (02/18 0534)  Labs:  Recent Labs  06/24/2015 1125 07/06/15 0545  HGB 13.5 11.7*  HCT 39.4 34.5*  PLT 145* 108*  APTT  --  >200*  CREATININE 1.54* 1.39*    Estimated Creatinine Clearance: 55.2 mL/min (by C-G formula based on Cr of 1.39).  Assessment: Initial aPTT is supra-therapeutic at >200, using aPTT to dose heparin for now given Xarelto influence on anti-Xa levels.   Of note, pt does have some liver dysfunction and could be contributing to the elevated aPTT, but we don't know to what magnitude because a baseline aPTT wasn't checked.   Goal of Therapy:  Heparin level 0.3-0.7 units/ml aPTT 66-102 seconds Monitor platelets by anticoagulation protocol: Yes   Plan:  -Hold heparin x 1 hr -Re-start heparin drip at 1000 units/hr at 0830 -1530 aPTT -Daily CBC/HL/aPTT -Monitor for bleeding  Abran Duke 07/06/2015,7:31 AM

## 2015-07-06 NOTE — Progress Notes (Signed)
   Subjective: Lee Klein had no acute events overnight. He says he is feeling better overall, but still has occasional back pain > epigastric pain well relieved with dilaudid. He had 2 episodes of emesis since yesterday afternoon. He has had 1 episode of output from his ostomy. He has no other issues at this time.  Objective: Vital signs in last 24 hours: Filed Vitals:   07/06/2015 1800 07/11/2015 1902 06/26/2015 2130 07/06/15 0534  BP: 150/89 154/106 166/69 143/75  Pulse: 101 98 114 100  Temp:  97.7 F (36.5 C) 98.2 F (36.8 C) 98.2 F (36.8 C)  TempSrc:  Oral Oral Oral  Resp:  18 18 18   Height:      Weight:      SpO2: 99% 100% 97% 97%     Gen: Chronically ill-appearing, alert and oriented to person, place, and time.  CV: Normal rate, regular rhythm, no murmurs, rubs, or gallops Pulmonary: Normal effort, CTA bilaterally, no crackles or wheezes Abdominal: Soft, mild tenderness to palpation particularly in epigastrum without rebound or guarding. Hypoactive bowel sounds auscultated. Ostomy site c/d/i with pink-colored output.  Extremities: Distal pulses 2+ in upper and lower extremities bilaterally, no tenderness or erythema. 2+ pitting edema in LLE>RLE. PICC in R arm has some darkish discoloration around the adhesive site, but PICC insertion site is c/d/i, without any surrounding erythema, warmth, edema, or tenderness.  Lab Results: Basic Metabolic Panel:  Recent Labs Lab 07/15/2015 1125 07/06/15 0545  NA 140 141  K 4.8 4.8  CL 112* 114*  CO2 17* 19*  GLUCOSE 87 60*  BUN 21* 18  CREATININE 1.54* 1.39*  CALCIUM 8.4* 7.6*   Liver Function Tests:  Recent Labs Lab 07/08/2015 1125 07/06/15 0545  AST 90* 61*  ALT 106* 76*  ALKPHOS 434* 333*  BILITOT 1.7* 1.7*  PROT 4.5* 3.7*  ALBUMIN 2.1* 1.6*    Recent Labs Lab 07/08/2015 1125  LIPASE 336*   CBC:  Recent Labs Lab 06/24/2015 1125 07/06/15 0545  WBC 17.0* 16.8*  NEUTROABS  --  14.2*  HGB 13.5 11.7*  HCT 39.4 34.5*    MCV 102.6* 102.1*  PLT 145* 108*   Assessment/Plan: 1. 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 Despite only modest ostomy output, less likely obstruction.  -Will discuss with ID regarding antibiotics, appetite stimulant -NPO; advance diet as tolerated -Dilaudid 1mg  q 3hrs PRN for pain -Phenergan PRN for nausea -IVNS @ 125/hr; bolus PRN -Trend CMP, CBC, lactate  2. 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 Crohn's on chronic pred complicated by adrenal insufficiency - No overt s/s AI here -Continue mild stress dose hydrocortisone IV 40mg  AM, 20mg  PM   Dispo: Disposition is deferred at this time, awaiting improvement of current medical problems.  Anticipated discharge in approximately 1-3 day(s).   The patient does have a current PCP Lee Pandy, MD) and does need an [facility] hospital follow-up appointment after discharge.  The patient does not have transportation limitations that hinder transportation to clinic appointments.  LOS: 1 day   Darrick Huntsman, MD 07/06/2015, 9:57 AM

## 2015-07-06 NOTE — Progress Notes (Signed)
ANTICOAGULATION CONSULT NOTE - Follow Up Consult  Pharmacy Consult for Lovenox (Xarelto on hold) Indication: Hx DVT  Patient Measurements: Height: 5\' 10"  (177.8 cm) Weight: 156 lb 1.4 oz (70.8 kg) IBW/kg (Calculated) : 73  Vital Signs: Temp: 98.2 F (36.8 C) (02/18 0534) Temp Source: Oral (02/18 0534) BP: 143/75 mmHg (02/18 0534) Pulse Rate: 100 (02/18 0534)  Labs:  Recent Labs  07/12/2015 1125 07/06/15 0545  HGB 13.5 11.7*  HCT 39.4 34.5*  PLT 145* 108*  APTT  --  >200*  HEPARINUNFRC  --  2.18*  CREATININE 1.54* 1.39*    Estimated Creatinine Clearance: 55.2 mL/min (by C-G formula based on Cr of 1.39).  Assessment: PTA Xarelto (Last dose was 2/16) for hx of DVT (per family happened back in October / November). Also talked with MD and seemed to be a provoked DVT. Patient is NPO so pharmacy consulted to start heparin last night. Initial aPTT is supra-therapeutic at >200, using aPTT to dose heparin for now given Xarelto influence on anti-Xa levels. Of note, pt does have some liver dysfunction and could be contributing to the elevated aPTT, but we don't know to what magnitude because a baseline aPTT wasn't checked.  Now to switch to Lovenox for DVT prophylaxis. Hgb low at 11.7, plts 108.  Goal of Therapy:  Monitor platelets by anticoagulation protocol: Yes   Plan:  Stop heparin gtt Start Lovenox 40mg  [place] Q24 F/U Xarelto restart when able to take PO  Rx will sign off for now  Enzo Bi, PharmD, [facility] Clinical Pharmacist Pager [phone] 07/06/2015 8:46 AM

## 2015-07-07 DIAGNOSIS — R634 Abnormal weight loss: Secondary | ICD-10-CM

## 2015-07-07 DIAGNOSIS — R197 Diarrhea, unspecified: Secondary | ICD-10-CM

## 2015-07-07 DIAGNOSIS — R630 Anorexia: Secondary | ICD-10-CM | POA: Insufficient documentation

## 2015-07-07 DIAGNOSIS — R1013 Epigastric pain: Secondary | ICD-10-CM

## 2015-07-07 DIAGNOSIS — R1115 Cyclical vomiting syndrome unrelated to migraine: Secondary | ICD-10-CM | POA: Insufficient documentation

## 2015-07-07 DIAGNOSIS — K5090 Crohn's disease, unspecified, without complications: Secondary | ICD-10-CM

## 2015-07-07 DIAGNOSIS — G43A0 Cyclical vomiting, not intractable: Secondary | ICD-10-CM

## 2015-07-07 DIAGNOSIS — K5000 Crohn's disease of small intestine without complications: Secondary | ICD-10-CM | POA: Insufficient documentation

## 2015-07-07 LAB — COMPREHENSIVE METABOLIC PANEL
ALK PHOS: 291 U/L — AB (ref 38–126)
ALT: 59 U/L (ref 17–63)
ANION GAP: 5 (ref 5–15)
AST: 56 U/L — ABNORMAL HIGH (ref 15–41)
Albumin: 1.4 g/dL — ABNORMAL LOW (ref 3.5–5.0)
BILIRUBIN TOTAL: 1.6 mg/dL — AB (ref 0.3–1.2)
BUN: 16 mg/dL (ref 6–20)
CALCIUM: 7.1 mg/dL — AB (ref 8.9–10.3)
CO2: 20 mmol/L — ABNORMAL LOW (ref 22–32)
Chloride: 111 mmol/L (ref 101–111)
Creatinine, Ser: 1.22 mg/dL (ref 0.61–1.24)
GLUCOSE: 56 mg/dL — AB (ref 65–99)
POTASSIUM: 4.4 mmol/L (ref 3.5–5.1)
Sodium: 136 mmol/L (ref 135–145)
TOTAL PROTEIN: 3.3 g/dL — AB (ref 6.5–8.1)

## 2015-07-07 LAB — CBC
HEMATOCRIT: 32 % — AB (ref 39.0–52.0)
HEMOGLOBIN: 10.6 g/dL — AB (ref 13.0–17.0)
MCH: 32.9 pg (ref 26.0–34.0)
MCHC: 33.1 g/dL (ref 30.0–36.0)
MCV: 99.4 fL (ref 78.0–100.0)
Platelets: 93 10*3/uL — ABNORMAL LOW (ref 150–400)
RBC: 3.22 MIL/uL — ABNORMAL LOW (ref 4.22–5.81)
RDW: 18.1 % — AB (ref 11.5–15.5)
WBC: 15.4 10*3/uL — ABNORMAL HIGH (ref 4.0–10.5)

## 2015-07-07 MED ORDER — ONDANSETRON HCL 4 MG/2ML IJ SOLN
4.0000 mg | Freq: Four times a day (QID) | INTRAMUSCULAR | Status: DC | PRN
  Administered 2015-07-07: 4 mg via INTRAVENOUS
  Filled 2015-07-07: qty 2

## 2015-07-07 MED ORDER — DEXTROSE-NACL 5-0.9 % IV SOLN
INTRAVENOUS | Status: DC
  Administered 2015-07-07 – 2015-07-08 (×2): via INTRAVENOUS

## 2015-07-07 MED ORDER — ONDANSETRON HCL 4 MG/2ML IJ SOLN
4.0000 mg | Freq: Three times a day (TID) | INTRAMUSCULAR | Status: DC
  Administered 2015-07-07 – 2015-07-09 (×5): 4 mg via INTRAVENOUS
  Filled 2015-07-07 (×5): qty 2

## 2015-07-07 NOTE — Consult Note (Addendum)
Gastroenterology and Hepatology Consult Note   History Lee Klein MRN # [id number]  Date of Admission: 2015-07-09 Date of Consultation: 07/07/2015 Referring physician: Dr. Doneen Poisson, MD  Reason for Consultation/Chief Complaint: Nausea and anorexia  Subjective HPI:  This is a 65 year old man not previously seen by our practice. He is been followed for years by Dr.Rehmann in the  for lifelong Crohn's disease with an ileostomy on chronic prednisone. This patient has a complicated history over the last year or so, where he developed left hand osteomyelitis requiring multiple orthopedic procedures and long-term broad-spectrum antibiotics. He has had multiple changes in antibiotics, including a new one this past December. The details of this are all outlined in history and physical and infectious disease consults. In short, he has had months of chronic epigastric pain and anorexia and nausea and unspecified weight loss. This has been felt likely due to 1 or more of his antibiotics, however which medicine or combination of them might be causing it is unclear. In short, his symptoms are gotten worse over the last 2 months and his oral intake declined. He is currently on when necessary Zofran, and his wife notes that his team is considering using Marinol. There is been some concern for possible pancreatitis because of elevated lipase now and also during hospital stay in June 2016. Lipase was 180 at that time, and a CT scan suggested diffuse pancreatic edema. I reviewed that CT scan personally, not sure this edema is really apparent to me. There are certainly no peripancreatic stranding. Since LFTs also been elevated chronically, but his CBD is not dilated and multiple imaging studies. His lipase is elevated at 335 this admission. 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 History was obtained from his wife and an extensive chart  review. His wife reports his doctors have tried multiple medicines to stimulate his appetite such as Remeron Effexor and Klonopin, but they'll only made him feel worse. ROS:  Constitutional:  Unquantified weight loss ENT:   Hearing loss  Psychiatric:   He admits to sadness over this chronic illness  All other systems are negative except as noted above in the HPI, as near as can be determined given his limited history abilities.  Past Medical History Past Medical History  Diagnosis Date  . Crohn's disease (HCC)   . GERD (gastroesophageal reflux disease)   . Anxiety   . 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 PICC (peripherally inserted central catheter) in place     right arm  . Osteomyelitis of hand, left, acute (HCC)   . Hearing loss   . Pancreatitis 10/2014  . History of kidney stones   . History of pancreatitis 11/06/2014  . Mycobacterial infection     left hand/wrist  . Thin skin   . Septic arthritis of wrist, left (HCC)   . DVT (deep venous thrombosis) (HCC) 04/2015    left leg  . Pneumonia 04/2015    Past Surgical History Past Surgical History  Procedure Laterality Date  . Colonoscopy    . Appendectomy  ~ 2009  . Ileostomy closure N/A 10/23/2013    Procedure: ILEOSTOMY TAKEDOWN, REPAIR OF PARASTOMAL HERNIA.;  Surgeon: Cherylynn Ridges, MD;  Location: [facility];  Service: General;  Laterality: N/A;  . Wrist arthroscopy with debridement Left 05/24/2014    Procedure: LEFT ARTHROSCOPY WRIST CULTURE/BIOPSY DEBRIDEMENT;  Surgeon: Cindee Salt, MD;  Location: [facility];  Service: Orthopedics;  Laterality: Left;  . Esophagogastroduodenoscopy N/A 06/15/2014    Procedure: ESOPHAGOGASTRODUODENOSCOPY (EGD);  Surgeon: Malissa Hippo, MD;  Location: [facility];  Service: Endoscopy;  Laterality: N/A;  . Cholecystectomy  ~ 2008  . Parastomal hernia repair  10/2013  . Incision and drainage abscess Left 06/22/2014    Procedure: INCISION, DEBRIDEMENT  AND IRRIGATION LEFT HAND/WRIST;  Surgeon: Cindee Salt, MD;  Location: [facility];  Service: Orthopedics;  Laterality: Left;  . Incision and drainage of wound Left 07/27/2014    Procedure: IRRIGATION AND DEBRIDEMENT WOUND;  Surgeon: Cindee Salt, MD;  Location: [facility];  Service: Orthopedics;  Laterality: Left;  . Incision and drainage abscess Left 08/09/2014    Procedure: INCISION AND DRAINAGE LEFT WRIST;  Surgeon: Cindee Salt, MD;  Location: [facility];  Service: Orthopedics;  Laterality: Left;  . Incision and drainage abscess Left 09/05/2014    Procedure: INCISION AND DRAINAGE LEFT HAND;  Surgeon: Cindee Salt, MD;  Location: [facility];  Service: Orthopedics;  Laterality: Left;  . I&d extremity Left 10/02/2014    Procedure: IRRIGATION AND DEBRIDEMENT  LEFT HAND;  Surgeon: Cindee Salt, MD;  Location: [facility];  Service: Orthopedics;  Laterality: Left;  . I&d extremity Left 10/12/2014    Procedure: MINOR IRRIGATION AND DEBRIDEMENT LEFT HAND;  Surgeon: Cindee Salt, MD;  Location: [facility];  Service: Orthopedics;  Laterality: Left;  . Incision and drainage abscess Left 11/12/2014    Procedure: INCISION AND DRAINAGE ABSCESS LEFT HAND;  Surgeon: Cindee Salt, MD;  Location: [facility];  Service: Orthopedics;  Laterality: Left;  . Incision and drainage Left 11/27/2014    Procedure: INCISION AND DRAINAGE LEFT HAND;  Surgeon: Cindee Salt, MD;  Location: [facility];  Service: Orthopedics;  Laterality: Left;  . Tonsillectomy  1963  . Colon surgery  ~ 2010    for a colonic stricture at the anastomosis from a colostomy reversal/notes 10/03/2013  . Parastomal hernia repair  11/02/2013  . Orif distal radius fracture Right 06/29/2006  . Ileostomy    . Ileoscopy  07/02/2010  . I&d extremity Left 01/01/2015    Procedure: IRRIGATION AND DEBRIDEMENT LEFT HAND/WRIST;  Surgeon: Cindee Salt, MD;  Location: [facility]  ;  Service: Orthopedics;  Laterality: Left;    Family History No family history on file.  Social History Social History   Social History  . Marital Status: Married    Spouse Name: N/A  . Number of Children: N/A  . Years of Education: N/A   Social History Main Topics  . Smoking status: Former Smoker -- 30 years    Quit date: 05/31/2006  . Smokeless tobacco: Never Used  . Alcohol Use: 0.0 oz/week    0 Standard drinks or equivalent per week     Comment: rare  . Drug Use: No  . Sexual Activity:    Partners: Female    Copy: None   Other Topics Concern  . None   Social History Narrative    Allergies Allergies  Allergen Reactions  . Amikacin Other (See Comments)    CAUSED DEAFNESS  . Cefoxitin Nausea Only    ABD. PAIN  . Fish Allergy Nausea And Vomiting  . Iodine Nausea Only    JOINT PAIN  . Ivp Dye [Iodinated Diagnostic Agents] Nausea Only    JOINT PAIN  . Rifampin Nausea And Vomiting and Other (See Comments)  CHILLS  . Shellfish Allergy Swelling  . Vancomycin Other (See Comments)    Red Man Syndrome - was told that it was given to fast.  . Demerol Rash    Outpatient Meds Outpatient medicine list  Inpatient med list reviewed  _____________________________________________________________________ Objective  Exam:  Current vital signs  Patient Vitals for the past 8 hrs:  BP Temp Temp src Pulse Resp SpO2  07/07/15 1447 (!) 169/75 mmHg 97.9 F (36.6 C) Oral 79 18 97 %    Intake/Output Summary (Last 24 hours) at 07/07/15 1658 Last data filed at 07/07/15 1300  Gross per 24 hour  Intake 2307.92 ml  Output    600 ml  Net 1707.92 ml    Physical Exam:  General: this is a chronically ill-appearing white male patient in no acute distress. He cannot hear, and communicates by reading lips in writing notes  Eyes: sclera anicteric, no redness  ENT: oral mucosa moist without lesions, no cervical or supraclavicular  lymphadenopathy, fair dentition  CV: RRR without murmur, S1/S2, no JVD,, no peripheral edema  Resp: clear to auscultation bilaterally, normal RR and effort noted  GI: soft, mild epigastric tenderness, with active bowel sounds. No guarding or palpable organomegaly noted, right lower quadrant ileostomy with stool in the bag  Skin; warm and dry, no rash or jaundice noted  Neuro: awake, alert and oriented x 3. Normal gross motor function and fluent speech. PICC line right upper arm Postsurgical changes left hand and wrist Labs:   Recent Labs Lab 07/09/2015 1125 07/06/15 0545 07/07/15 0531  WBC 17.0* 16.8* 15.4*  HGB 13.5 11.7* 10.6*  HCT 39.4 34.5* 32.0*  PLT 145* 108* 93*    Recent Labs Lab 07/07/15 0804  NA 136  K 4.4  CL 111  CO2 20*  BUN 16  ALBUMIN 1.4*  ALKPHOS 291*  ALT 59  AST 56*  GLUCOSE 56*   No results for input(s): INR in the last 168 hours.  Radiologic studies: CT scan June 2016 personally reviewed.  His ultrasound this admission suggests possible pancreatic edema, but frankly the pancreas cannot be well evaluated abdominal ultrasound.  @ASSESSMENTPLANBEGIN @ Impression:  Epigastric pain Nausea and vomiting Anorexia Weight loss Chronic osteomyelitis requiring broad-spectrum long-term antibiotics Crohn's disease on chronic steroids Antibiotic related diarrhea  It is not clear to me that he truly has pancreatitis. Elevated lipase, even to this degree, can happen with chronic nausea and vomiting. If it is pancreatitis, it ia impossible to know which of his medicines may be causing it, and it sounds like he must be on chronic broad-spectrum antibiotics. Even still, my recommendations below remain the same. If pancreatitis, it is not biliary/stone in origin. Apparently, consideration was given to possible adrenal insufficiency, but ruled out when he did not improve with change from prednisone to solumedrol.  Plan:  As I'm sure his ID team wishes to  do, eliminate any antibiotics that may appear to be offenders for his GI symptoms, narrow his regimen as much as possible Round-the-clock Zofran The Marinol is an interesting suggestion, though I have no personal experience using this medicine. He should be on TPN at least for the next couple of weeks while some medication changes hopefully improve his symptoms.Marland Kitchen He is not able to take enough by mouth to maintain adequate nutrition during his chronic illness, and he is a poor candidate for a surgical J-tube placement for enteral nutrition. I will order a prealbumin level for the morning - please have the TPN nutrition service  evaluate him tomorrow AM.  Dr. Adela Lank will be the GI attending starting tomorrow.  Thank you for the courtesy of this consult.  Please contact me with any questions or concerns.  Charlie Pitter III Pager: [phone] Mon-Fri 8a-5p [phone] after 5p, weekends, holidays

## 2015-07-07 NOTE — Progress Notes (Signed)
[facility]  Patient Status:   Active with [facility] prior to this readmission  [facility] is providing the following services: HHRN and [facility] for home IV ABX.  [facility] hospital team will follow [name] while an inpatient to support transition home when ordered.  If patient discharges after hours, please call [phone].   Sedalia Muta 07/07/2015, 10:18 PM

## 2015-07-07 NOTE — Progress Notes (Signed)
Subjective: This morning, he reports nausea. Her review of the chart, it looks like he never requested Phenergan yesterday. He still reports pain in his back.   We spoke with Dr. Daiva Eves yesterday was currently in [place] but will be back this week and is agreeable to seeing the patient. He would like GI to weigh in with recommendations on the pancreatitis.  Objective: Vital signs in last 24 hours: Filed Vitals:   07/06/15 0534 07/06/15 1300 07/06/15 2116 07/07/15 0441  BP: 143/75 137/84 132/82 157/77  Pulse: 100 121 104 88  Temp: 98.2 F (36.8 C)  98.5 F (36.9 C) 98.7 F (37.1 C)  TempSrc: Oral  Oral Oral  Resp: Height:      Weight:    166 lb 10.7 oz (75.6 kg)  SpO2: 97% 97% 98% 97%     Gen: Chronically ill-appearing, Caucasian male, alert and oriented to person, place, and time.  CV: Normal rate, regular rhythm, no murmurs, rubs, or gallops Pulmonary: Normal effort, CTA bilaterally, no crackles or wheezes Abdominal: Soft, mild tenderness to palpation particularly in epigastrum without rebound or guarding. Hypoactive bowel sounds auscultated. Ostomy site c/d/i with pink-colored output.  Extremities: Distal pulses 2+ in upper and lower extremities bilaterally, no tenderness or erythema. 2+ pitting edema in LLE>RLE. PICC in R arm has some darkish discoloration around the adhesive site, but PICC insertion site is without any surrounding erythema, warmth, edema, or tenderness.  Lab Results: Basic Metabolic Panel:  Recent Labs Lab 07/09/2015 1125 07/06/15 0545  NA 140 141  K 4.8 4.8  CL 112* 114*  CO2 17* 19*  GLUCOSE 87 60*  BUN 21* 18  CREATININE 1.54* 1.39*  CALCIUM 8.4* 7.6*   Liver Function Tests:  Recent Labs Lab 07/15/2015 1125 07/06/15 0545  AST 90* 61*  ALT 106* 76*  ALKPHOS 434* 333*  BILITOT 1.7* 1.7*  PROT 4.5* 3.7*  ALBUMIN 2.1* 1.6*    Recent Labs Lab 07/04/2015 1125  LIPASE 336*   CBC:  Recent Labs Lab 07/06/15 0545  07/07/15 0531  WBC 16.8* 15.4*  NEUTROABS 14.2*  --   HGB 11.7* 10.6*  HCT 34.5* 32.0*  MCV 102.1* 99.4  PLT 108* 93*   Assessment/Plan: 1. Acute pancreatitis: Suspect antibiotics are the etiology in absence of other causes, like alcohol or gallstone as supported by lab and imaging findings. Transaminitis downtrending today though total bilirubin remains elevated today. In reviewing his antibiotics, azithromycin and tigecycline may be associated with nausea/vomiting, and clofazimine is associated with GI disease. -Follow-up recommendations from Dr. Daiva Eves when he sees him tomorrow -Consult GI per Dr. Daiva Eves for additional recommendations -Continue NOP though will advance diet as tolerated -Continue Dilaudid  q 3hrs PRN for pain -Encouraged patient to request Phenergan PRN for nausea -Continue IVNS @ 125/hr; bolus PRN -Follow-up CBC & CMET tomorrow  2. 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 Algis Liming, his infection is susceptible to tigecycline, clarithromycin, azithromycin, clofazimine.  -Continue azithro, tigecycline, clofazimine though it may be possible to drop tigecycline per conversations with Dr. Daiva Eves though will defer to him  3. Crohn's on chronic pred complicated by adrenal insufficiency - No overt s/s AI here -Continue mild stress dose hydrocortisone IV  AM,  PM   4. Thrombocytopenia: Platelets 93 this morning, decreased from low 100s on admission. Heparin was stopped yesterday as he had been adequately treated for a  provoked DVT for 3 months with Xarelto, and he was switched over to Lovenox for DVT prophylaxis. B12 deficiency may be contributory given Crohn's and borderline high MCV; B12 normal when last checked in February. Of his antibiotics, azithromycin and tigecycline may also be contributory too. New infection unlikely.  -CBC as noted above -Check B12  Dispo: Disposition is  deferred at this time, awaiting improvement of current medical problems.  Anticipated discharge in approximately 1-3 day(s).   The patient does have a current PCP [name], MD) and does need an [facility] hospital follow-up appointment after discharge.  The patient does not have transportation limitations that hinder transportation to clinic appointments.  LOS: 2 days   Beather Arbour, MD 07/07/2015, 7:41 AM

## 2015-07-08 ENCOUNTER — Encounter (HOSPITAL_COMMUNITY): Payer: Self-pay | Admitting: General Practice

## 2015-07-08 DIAGNOSIS — R7989 Other specified abnormal findings of blood chemistry: Secondary | ICD-10-CM

## 2015-07-08 DIAGNOSIS — A319 Mycobacterial infection, unspecified: Secondary | ICD-10-CM

## 2015-07-08 DIAGNOSIS — Z7952 Long term (current) use of systemic steroids: Secondary | ICD-10-CM

## 2015-07-08 DIAGNOSIS — R945 Abnormal results of liver function studies: Secondary | ICD-10-CM

## 2015-07-08 DIAGNOSIS — E440 Moderate protein-calorie malnutrition: Secondary | ICD-10-CM | POA: Insufficient documentation

## 2015-07-08 DIAGNOSIS — K50019 Crohn's disease of small intestine with unspecified complications: Secondary | ICD-10-CM

## 2015-07-08 DIAGNOSIS — M00832 Arthritis due to other bacteria, left wrist: Secondary | ICD-10-CM

## 2015-07-08 DIAGNOSIS — D696 Thrombocytopenia, unspecified: Secondary | ICD-10-CM

## 2015-07-08 DIAGNOSIS — M009 Pyogenic arthritis, unspecified: Secondary | ICD-10-CM | POA: Insufficient documentation

## 2015-07-08 DIAGNOSIS — K5090 Crohn's disease, unspecified, without complications: Secondary | ICD-10-CM | POA: Insufficient documentation

## 2015-07-08 DIAGNOSIS — N179 Acute kidney failure, unspecified: Secondary | ICD-10-CM

## 2015-07-08 DIAGNOSIS — K859 Acute pancreatitis without necrosis or infection, unspecified: Secondary | ICD-10-CM

## 2015-07-08 DIAGNOSIS — K50018 Crohn's disease of small intestine with other complication: Secondary | ICD-10-CM

## 2015-07-08 DIAGNOSIS — M869 Osteomyelitis, unspecified: Secondary | ICD-10-CM | POA: Insufficient documentation

## 2015-07-08 HISTORY — DX: Acute kidney failure, unspecified: N17.9

## 2015-07-08 HISTORY — DX: Acute pancreatitis without necrosis or infection, unspecified: K85.90

## 2015-07-08 LAB — COMPREHENSIVE METABOLIC PANEL
ALBUMIN: 1.5 g/dL — AB (ref 3.5–5.0)
ALK PHOS: 267 U/L — AB (ref 38–126)
ALT: 59 U/L (ref 17–63)
ANION GAP: 8 (ref 5–15)
AST: 75 U/L — ABNORMAL HIGH (ref 15–41)
BUN: 18 mg/dL (ref 6–20)
CALCIUM: 7.5 mg/dL — AB (ref 8.9–10.3)
CHLORIDE: 114 mmol/L — AB (ref 101–111)
CO2: 20 mmol/L — AB (ref 22–32)
Creatinine, Ser: 1.28 mg/dL — ABNORMAL HIGH (ref 0.61–1.24)
GFR calc non Af Amer: 58 mL/min — ABNORMAL LOW (ref >60)
GLUCOSE: 121 mg/dL — AB (ref 65–99)
Potassium: 4.2 mmol/L (ref 3.5–5.1)
SODIUM: 142 mmol/L (ref 135–145)
Total Bilirubin: 1.3 mg/dL — ABNORMAL HIGH (ref 0.3–1.2)
Total Protein: 3.5 g/dL — ABNORMAL LOW (ref 6.5–8.1)

## 2015-07-08 LAB — DIFFERENTIAL
BASOS ABS: 0 10*3/uL (ref 0.0–0.1)
BASOS PCT: 0 %
Eosinophils Absolute: 0 10*3/uL (ref 0.0–0.7)
Eosinophils Relative: 0 %
Lymphocytes Relative: 7 %
Lymphs Abs: 0.9 10*3/uL (ref 0.7–4.0)
Monocytes Absolute: 1.4 10*3/uL — ABNORMAL HIGH (ref 0.1–1.0)
Monocytes Relative: 11 %
NEUTROS ABS: 11.1 10*3/uL — AB (ref 1.7–7.7)
Neutrophils Relative %: 83 %

## 2015-07-08 LAB — CBC
HEMATOCRIT: 30.5 % — AB (ref 39.0–52.0)
HEMOGLOBIN: 10.6 g/dL — AB (ref 13.0–17.0)
MCH: 35.3 pg — ABNORMAL HIGH (ref 26.0–34.0)
MCHC: 34.8 g/dL (ref 30.0–36.0)
MCV: 101.7 fL — ABNORMAL HIGH (ref 78.0–100.0)
Platelets: 81 10*3/uL — ABNORMAL LOW (ref 150–400)
RBC: 3 MIL/uL — ABNORMAL LOW (ref 4.22–5.81)
RDW: 18.1 % — AB (ref 11.5–15.5)
WBC: 13.4 10*3/uL — AB (ref 4.0–10.5)

## 2015-07-08 LAB — GLUCOSE, CAPILLARY: Glucose-Capillary: 133 mg/dL — ABNORMAL HIGH (ref 65–99)

## 2015-07-08 LAB — MAGNESIUM: MAGNESIUM: 1.7 mg/dL (ref 1.7–2.4)

## 2015-07-08 LAB — LIPASE, BLOOD: Lipase: 47 U/L (ref 11–51)

## 2015-07-08 LAB — TRIGLYCERIDES: TRIGLYCERIDES: 66 mg/dL (ref <150)

## 2015-07-08 LAB — PREALBUMIN: Prealbumin: 4.3 mg/dL — ABNORMAL LOW (ref 18–38)

## 2015-07-08 LAB — VITAMIN B12: VITAMIN B 12: 1071 pg/mL — AB (ref 180–914)

## 2015-07-08 LAB — PHOSPHORUS: PHOSPHORUS: 2.3 mg/dL — AB (ref 2.5–4.6)

## 2015-07-08 MED ORDER — MAGNESIUM SULFATE 2 GM/50ML IV SOLN
2.0000 g | Freq: Once | INTRAVENOUS | Status: AC
  Administered 2015-07-08: 2 g via INTRAVENOUS
  Filled 2015-07-08: qty 50

## 2015-07-08 MED ORDER — POTASSIUM PHOSPHATES 15 MMOLE/5ML IV SOLN
20.0000 mmol | Freq: Once | INTRAVENOUS | Status: AC
  Administered 2015-07-08: 20 mmol via INTRAVENOUS
  Filled 2015-07-08: qty 6.67

## 2015-07-08 MED ORDER — TRACE MINERALS CR-CU-MN-SE-ZN 10-1000-500-60 MCG/ML IV SOLN
INTRAVENOUS | Status: AC
  Administered 2015-07-08: 18:00:00 via INTRAVENOUS
  Filled 2015-07-08: qty 960

## 2015-07-08 MED ORDER — INSULIN ASPART 100 UNIT/ML ~~LOC~~ SOLN
0.0000 [IU] | Freq: Four times a day (QID) | SUBCUTANEOUS | Status: DC
  Administered 2015-07-08 – 2015-07-12 (×9): 1 [IU] via SUBCUTANEOUS
  Administered 2015-07-13 – 2015-07-14 (×3): 2 [IU] via SUBCUTANEOUS

## 2015-07-08 MED ORDER — FAT EMULSION 20 % IV EMUL
240.0000 mL | INTRAVENOUS | Status: AC
  Administered 2015-07-08: 240 mL via INTRAVENOUS
  Filled 2015-07-08: qty 250

## 2015-07-08 MED ORDER — PROMETHAZINE HCL 25 MG/ML IJ SOLN
25.0000 mg | Freq: Four times a day (QID) | INTRAMUSCULAR | Status: DC | PRN
  Administered 2015-07-08 – 2015-07-09 (×2): 25 mg via INTRAVENOUS
  Filled 2015-07-08 (×2): qty 1

## 2015-07-08 MED ORDER — PANTOPRAZOLE SODIUM 40 MG IV SOLR
40.0000 mg | INTRAVENOUS | Status: DC
  Administered 2015-07-08 – 2015-07-16 (×9): 40 mg via INTRAVENOUS
  Filled 2015-07-08 (×11): qty 40

## 2015-07-08 NOTE — Progress Notes (Signed)
Subjective:  Lee Klein says he is having no complaints this morning, although his wife says he still has intermittent back and epigastric pain relieved by dilaudid. He has been nauseous without vomiting that is relieved with his scheduled antiemetics. He has no other complaints at this time.  Objective: Vital signs in last 24 hours: Filed Vitals:   07/07/15 1447 07/07/15 2130 07/08/15 0435 07/08/15 0500  BP: 169/75 141/75 148/71   Pulse: 79 80 86   Temp: 97.9 F (36.6 C) 97.8 F (36.6 C) 97.8 F (36.6 C)   TempSrc: Oral Oral Oral   Resp: Height:      Weight:    169 lb 15.6 oz (77.1 kg)  SpO2: 97% 98% 98%      Gen: Chronically ill-appearing, Caucasian male, alert and oriented to person, place, and time.  CV: Normal rate, regular rhythm, no murmurs, rubs, or gallops Pulmonary: Normal effort, CTA bilaterally, no crackles or wheezes Abdominal: Soft, mild tenderness to palpation particularly in epigastrum without rebound or guarding. Hypoactive bowel sounds auscultated. Ostomy site c/d/i with pink-colored output.  Extremities: Distal pulses 2+ in upper and lower extremities bilaterally, no tenderness or erythema. 2+ pitting edema in LLE>RLE. PICC in R arm has some darkish discoloration around the adhesive site, but PICC insertion site is without any surrounding erythema, warmth, edema, or tenderness.  Lab Results: Basic Metabolic Panel:  Recent Labs Lab 07/07/15 0804 07/08/15 0541  NA 136 142  K 4.4 4.2  CL 111 114*  CO2 20* 20*  GLUCOSE 56* 121*  BUN 16 18  CREATININE 1.22 1.28*  CALCIUM 7.1* 7.5*  MG  --  1.7  PHOS  --  2.3*   Liver Function Tests:  Recent Labs Lab 07/07/15 0804 07/08/15 0541  AST 56* 75*  ALT 59 59  ALKPHOS 291* 267*  BILITOT 1.6* 1.3*  PROT 3.3* 3.5*  ALBUMIN 1.4* 1.5*    Recent Labs Lab 07-10-15 1125  LIPASE 336*   CBC:  Recent Labs Lab 07/06/15 0545 07/07/15 0531 07/08/15 0541  WBC 16.8* 15.4* 13.4*  NEUTROABS  14.2*  --  11.1*  HGB 11.7* 10.6* 10.6*  HCT 34.5* 32.0* 30.5*  MCV 102.1* 99.4 101.7*  PLT 108* 93* 81*   Assessment/Plan: 1. Acute pancreatitis: Symptoms improving significantly since admission, but still present and pngoing x 2 months. Suspect antibiotics are the etiology if truly pancreatitis. Crohn's flare or AI are less likely. -Follow-up recommendations from Dr. Daiva Eves -GI rec's TPN as we optimize medication regimen x 2-3 weeks -On Dilaudid  q 3hrs PRN for pain; will try transition to PO meds today -Phenergan PRN for nausea -Will d/c MIVF for now given slight increase in LE edema, bolus PRN -Trend CBC & CMP  2. 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 Algis Liming, his infection is susceptible to tigecycline, clarithromycin, azithromycin, clofazimine.  -Continue azithro, tigecycline, clofazimine though it may be possible to drop tigecycline per conversations with Dr. Daiva Eves though will defer to him  3. Crohn's on chronic pred complicated by adrenal insufficiency - No overt s/s AI here -Continue mild stress dose hydrocortisone IV  AM,  PM   4. Thrombocytopenia: Platelets 81 this morning, decreased from low 100s on admission. Heparin was stopped 2/18 as he had been adequately treated for a provoked DVT for 3 months with Xarelto, and he was switched over to Lovenox for DVT prophylaxis. Not B12  deficient. Of his antibiotics, azithromycin and tigecycline may also be contributory too. New infection unlikely.  -CBC as noted above  Dispo: Disposition is deferred at this time, awaiting improvement of current medical problems.  Anticipated discharge in approximately 1-3 day(s).   The patient does have a current PCP [name], MD) and does need an [facility] hospital follow-up appointment after discharge.  The patient does not have transportation limitations that hinder transportation to clinic  appointments.  LOS: 3 days   Darrick Huntsman, MD 07/08/2015, 7:45 AM

## 2015-07-08 NOTE — Consult Note (Signed)
WOC ostomy consult note Stoma type/location: RUQ, ilesotomy Stomal assessment/size: pouch intact, did not change today Peristomal assessment: wife reports from time to time he has some redness but they are using antifungal appropriately.  Treatment options for stomal/peristomal skin: see above Output red, liquid stool Ostomy pouching: 2pc. 2 1/4" convex, precut with 2" barrier ring Using belt for comfort and security, wife reports this stoma is flush with the skin as well.  Education provided:  No questions from patient or wife today.   Supplies needed are in the room, patient has from home.  We do not carry convex 2pc so they will use home supplies.    Re consult if needed, will not follow at this time. Thanks  Meaghann Choo [employer], CWOCN [phone])

## 2015-07-08 NOTE — Progress Notes (Signed)
PARENTERAL NUTRITION CONSULT NOTE - INITIAL  Pharmacy Consult:  TPN Indication:  Severe pancreatitis  Allergies  Allergen Reactions  . Amikacin Other (See Comments)    CAUSED DEAFNESS  . Cefoxitin Nausea Only    ABD. PAIN  . Fish Allergy Nausea And Vomiting  . Iodine Nausea Only    JOINT PAIN  . Ivp Dye [Iodinated Diagnostic Agents] Nausea Only    JOINT PAIN  . Rifampin Nausea And Vomiting and Other (See Comments)    CHILLS  . Shellfish Allergy Swelling  . Vancomycin Other (See Comments)    Red Man Syndrome - was told that it was given to fast.  . Demerol Rash    Patient Measurements: Height:  (177.8 cm) Weight: 169 lb 15.6 oz (77.1 kg) IBW/kg (Calculated) : 73  Weight from Dec 2016 = 69 kg  Vital Signs: Temp: 97.8 F (36.6 C) (02/20 0435) Temp Source: Oral (02/20 0435) BP: 148/71 mmHg (02/20 0435) Pulse Rate: 86 (02/20 0435) Intake/Output from previous day: 02/19 0701 - 02/20 0700 In: 7835 [I.V.:7475; IV Piggyback:200] Out: 1000 [Urine:600; Stool:400]  Labs:  Recent Labs  07/06/15 0545 07/07/15 0531 07/08/15 0541  WBC 16.8* 15.4* 13.4*  HGB 11.7* 10.6* 10.6*  HCT 34.5* 32.0* 30.5*  PLT 108* 93* 81*  APTT >200*  --   --      Recent Labs  07/06/15 0545 07/07/15 0804 07/08/15 0541  NA 141 136 142  K 4.8 4.4 4.2  CL 114* 111 114*  CO2 19* 20* 20*  GLUCOSE 60* 56* 121*  BUN CREATININE 1.39* 1.22 1.28*  CALCIUM 7.6* 7.1* 7.5*  MG  --   --  1.7  PHOS  --   --  2.3*  PROT 3.7* 3.3* 3.5*  ALBUMIN 1.6* 1.4* 1.5*  AST 61* 56* 75*  ALT 76* 59 59  ALKPHOS 333* 291* 267*  BILITOT 1.7* 1.6* 1.3*  PREALBUMIN  --   --  4.3*  TRIG 28  --  66  CHOLHDL 2.0  --   --   CHOL 55  --   --    Estimated Creatinine Clearance: 61.8 mL/min (by C-G formula based on Cr of 1.28).   No results for input(s): GLUCAP in the last 72 hours.  Medical History: Past Medical History  Diagnosis Date  . Crohn's disease (HCC)   . GERD (gastroesophageal  reflux disease)   . Anxiety   . 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 PICC (peripherally inserted central catheter) in place     right arm  . Osteomyelitis of hand, left, acute (HCC)   . Hearing loss   . Pancreatitis 10/2014  . History of kidney stones   . History of pancreatitis 11/06/2014  . Mycobacterial infection     left hand/wrist  . Thin skin   . Septic arthritis of wrist, left (HCC)   . DVT (deep venous thrombosis) (HCC) 04/2015    left leg  . Pneumonia 04/2015      Insulin Requirements in the past 24 hours:  Not yet on SSI  Assessment: 74 YOM with a history of Crohn's disease s/p ileostomy presented on 07/04/2015 with intermittent epigastric abdominal pain, cramping, and vomiting for 2 months PTA.  Consulting ID to evaluate antibiotic therapy that could contribute to patient's symptoms.  Per documentation, patient is a poor candidate for surgical J-tube placement and while awaiting for antibiotic changes and resolution of GI symptoms for adequate  PO intake, pharmacy consulted to manage TPN for nutritional support.  Patient and wife (an Charity fundraiser) reported that patient was eating 100% of meals PTA and was gaining and losing weight off and on.  GI: hx Crohn's / GERD / pancreatitis - baseline prealbumin low at 4.3.  Scheduled Zofran + PRN Phenergan (QTc 451 ms).  Ileostomy O/P .  Considering Marinol to stimulate appetite. Endo: no hx - hypoglycemia prior to TPN initiation (on steroid) Lytes: Phos slightly low at 2.3, Mag low normal at 1.7, CL elevated at 114, others WNL Renal: SCr up 1.28, CrCL 62 ml/min - low UOP 0.3 ml/kr/hr, on D5NS at 125 ml/hr Pulm: stable on RA Cards: no hx - BP improving, HR controlled - not on med AC: Xarelto PTA for hx LLE DVT dx 02/2015, completed treatment course - mild anemia, plts decreased to 81K Hepatobil: ALT normalized, others elevated.  Tbili 4.3.  TG WNL Neuro: anxiety / hearing loss ID: Azith/Tygacil from PTA for  hx M.abscessus osteo (azithromycin, tigecycline, clofazimine study drug PTA) - afebrile, WBC improving Best Practices: Lovenox TPN Access: PICC placed 10/19/14 (for long-term abx administration) TPN start date: 2/20 >>  Current Nutrition:  Clear liquid diet  Nutritional Goals:  1900-2100 kCal, 95-115 grams of protein per day   Plan:  - Clinimix E 5/15 at 40 ml/hr + IVFE at 10 ml/hr.  Cycle once stable at goal TPN rate. - Daily multivitamin and trace elements - Start sensitive SSI Q6H.  D/C if CBGs remain controlled at goal TPN rate - KPhos 20 mmol IV x 1 - Mag sulfate 2gm IV x 1 - F/U AM labs   Pocahontas Cohenour D. Laney Potash, PharmD, BCPS Pager:  [phone] 07/08/2015, 11:09 AM

## 2015-07-08 NOTE — Progress Notes (Signed)
Patient ID: Lee Klein, male   DOB: Jan 10, 1951, 65 y.o.   MRN: [id number]    Progress Note   Subjective   Pt is deaf, wife at bedside(she is a Engineer, civil (consulting)).Marland Kitchen He is having ongoing upper abdominal pain radiating into his back x 5 days.This is superimposed on chronic nausea  And lack of appetite over past few months.  Lipase 47 today Prealbumin 4   Objective   Vital signs in last 24 hours: Temp:  [97.8 F (36.6 C)-97.9 F (36.6 C)] 97.8 F (36.6 C) (02/20 0435) Pulse Rate:  [79-86] 86 (02/20 0435) Resp:  [18-19] 18 (02/20 0435) BP: (141-169)/(71-75) 148/71 mmHg (02/20 0435) SpO2:  [97 %-98 %] 98 % (02/20 0435) Weight:  [77.1 kg (169 lb 15.6 oz)] 77.1 kg (169 lb 15.6 oz) (02/20 0500)   General:    white male in NAD  deaf Heart:  Regular rate and rhythm; no murmurs Lungs: Respirations even and unlabored, lungs CTA bilaterally Abdomen:  Soft, tender upper abdomen,  Bs + , ileostomy draining liquid stool Neurologic:  Alert and oriented,  grossly normal neurologically. Psych:  Cooperative. Normal mood and affect.  Intake/Output from previous day: 02/19 0701 - 02/20 0700 In: 7835 [I.V.:7475; IV Piggyback:200] Out: 1000 [Urine:600; Stool:400] Intake/Output this shift:    Lab Results:  Recent Labs  07/06/15 0545 07/07/15 0531 07/08/15 0541  WBC 16.8* 15.4* 13.4*  HGB 11.7* 10.6* 10.6*  HCT 34.5* 32.0* 30.5*  PLT 108* 93* 81*   BMET  Recent Labs  07/06/15 0545 07/07/15 0804 07/08/15 0541  NA 141 136 142  K 4.8 4.4 4.2  CL 114* 111 114*  CO2 19* 20* 20*  GLUCOSE 60* 56* 121*  BUN 18 16 18   CREATININE 1.39* 1.22 1.28*  CALCIUM 7.6* 7.1* 7.5*   LFT  Recent Labs  07/08/15 0541  PROT 3.5*  ALBUMIN 1.5*  AST 75*  ALT 59  ALKPHOS 267*  BILITOT 1.3*   PT/INR No results for input(s): LABPROT, INR in the last 72 hours.  Studies/Results: No results found.     Assessment / Plan:    #1  65 yo male with complicated hx on long term abx for osteomyelitis #2  Crohns disease - multiple surgeries- s/p ileostomy #3 steroid dependent #4 chronic  Adrenal insufficiency #5 malnutrition to start TPN  #6 chronic nausea and anorexia- secondary to underlying illness, long term antibiotics #7 acute abdominal l pain, nausea x 6 days - consistent with acute pancreatitis- ?antibiotic induced- Tygacil to be stopped  Clear liquids, pain control , round the clock antiemetics  If no improvement in few days then MRI abd   Principal Problem:   Acute pancreatitis Active Problems:   Crohn's disease (HCC)   Atypical mycobacterial infection of hand   AKI (acute kidney injury) (HCC)   PICC (peripherally inserted central catheter) in place   Chronic adrenal insufficiency (HCC)   Epigastric abdominal pain   Non-intractable cyclical vomiting with nausea   Anorexia   Abnormal loss of weight   Crohn's disease involving terminal ileum (HCC)     LOS: 3 days   Rina Adney  07/08/2015, 10:30 AM

## 2015-07-08 NOTE — Progress Notes (Addendum)
Initial Nutrition Assessment  DOCUMENTATION CODES:   Non-severe (moderate) malnutrition in context of chronic illness  INTERVENTION:   -TPN management per pharmacy -RD will follow for diet advancement  NUTRITION DIAGNOSIS:   Malnutrition related to chronic illness as evidenced by mild depletion of muscle mass, mild depletion of body fat.  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 In the ED he was noted to have a significant leukocytosis, lactic acidosis, acute kidney injury, and elevated lipase consistent with pancreatitis and was therefore admitted to the internal medicine teaching service for further evaluation and care. He denies a history of alcohol use or hypertriglyceridemia. He had a cholecystectomy in the past and has had no problems since. He has had pancreatitis in the past.  Pt admitted with acute pancreatitis.   Pt is deaf, but able to communicate via writing on a white board. Due to pt resting at time of visit, hx mainly obtained from pt's wife at bedside. Pt's wife reports pt with a general decline in health over the past two years, however, pt has  experienced a severe decline in the past 2 months, since pt was last admitted for pneumonia in December 2016. Pt wife reveals that pt has experienced a 30# weight loss over the past 2 years.   Pt wife reports that pt eats very little at baseline and has not ate well during hospitalization related to pancreatitis.  Pt with RLQ ileostomy. Noted a 200 ml output over the past 24 hours.   Nutrition-Focused physical exam completed. Findings are mild fat depletion, mild muscle depletion, and mild edema. Pt's wife reports she noted fat and muscle depletion on pt at home. Pt also with mild to moderate edema on both upper and lower extremities, which may be masking severity of malnutrition.   Plan is to start TPN at 1800 today. Per pharmacy note, pt is a poor candidate for J-tube placement. Plan to start Clinimix E 5/15 at 40 ml/hr + IVFE at 10 ml/hr (which provides 1162 kcals and 48 grams of protein, meeting 55% of estimated kcal needs and 42% of estimated protein needs).  ID following; plan to adjust antibiotics regimen to minimize GI symptoms.  Labs reviewed.   Diet Order:  Diet clear liquid Room service appropriate?: Yes; Fluid consistency:: Thin TPN (CLINIMIX-E) Adult  Skin:  Reviewed, no issues  Last BM:  07/07/15  Height:   Ht Readings from Last 1 Encounters:  07/08/2015  (1.778 m)    Weight:   Wt Readings from Last 1 Encounters:  07/08/15 169 lb 15.6 oz (77.1 kg)    Ideal Body Weight:  75.5 kg  BMI:  Body mass index is 24.39 kg/(m^2).  Estimated Nutritional Needs:   Kcal:  2100-2300  Protein:  115-130 grams  Fluid:  2.1-2.3 L  EDUCATION NEEDS:   Education needs addressed  Sharone Picchi A. Mayford Knife, RD, LDN, CDE Pager: [phone] After hours Pager: [phone]

## 2015-07-08 NOTE — Consult Note (Signed)
Date of Admission:  07/12/2015  Date of Consult:  07/08/2015  Reason for Consult: possible antibiotic induced pancreatitis, nausea and vomiting Referring Physician: Dr. Evette Doffing   HPI: Lee Klein is an 65 y.o. 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 Fredna Dow in January of 2016 sp multiple surgeries to control the infection. The organism is highly R (see my last clinic note).  We had him at one point on high dose continuous infusion of cefoxitin, clarithromycin, zyvox, then changed to cefoxitin, macrolide and tygacil and had admission with pancreatitis that was blamed on the cefoxitin. All meds were stopped at that time and then tygacil and macrolide restarted.   Ultimately we were able to procure clofazamine and added this to azithromycin and IV tygacil and he has been maintained on both of these. He has continued to have difficulty tolerating these abx with chronic nausea, intermittent vomiting, abdominal pain, mildly elevated LFTs. This worsened recently when he was rx for Serratia bacteremia (that was more likely a line infection). I saw him in January and we continued onwards but in past week his abdominal pain worsened signficantly and could eat no food even the food he takes with his clofazimine. On admission in addition to severe nausea, abdominal pain his lipase was elevated to 300s and GI was consulted. GI do not believe that the elevation in lipase constitues a clear cut antibiotic induced pancreatitis but nonetheless there remains concern that the abx are driving majority of his GI symptoms.      Past Medical History  Diagnosis Date  . Crohn's disease ([place])   . GERD (gastroesophageal reflux disease)   . Anxiety   . 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 PICC (peripherally inserted central catheter) in place     right arm  .  Osteomyelitis of hand, left, acute ([place])   . Hearing loss   . Pancreatitis 10/2014  . History of kidney stones   . History of pancreatitis 11/06/2014  . Mycobacterial infection     left hand/wrist  . Thin skin   . Septic arthritis of wrist, left ([place])   . DVT (deep venous thrombosis) ([place]) 04/2015    left leg  . Pneumonia 04/2015    Past Surgical History  Procedure Laterality Date  . Colonoscopy    . Appendectomy  ~ 2009  . Ileostomy closure N/A 10/23/2013    Procedure: ILEOSTOMY TAKEDOWN, REPAIR OF PARASTOMAL HERNIA.;  Surgeon: Gwenyth Ober, MD;  Location: [facility];  Service: General;  Laterality: N/A;  . Wrist arthroscopy with debridement Left 05/24/2014    Procedure: LEFT ARTHROSCOPY WRIST CULTURE/BIOPSY DEBRIDEMENT;  Surgeon: Daryll Brod, MD;  Location: [facility];  Service: Orthopedics;  Laterality: Left;  . Esophagogastroduodenoscopy N/A 06/15/2014    Procedure: ESOPHAGOGASTRODUODENOSCOPY (EGD);  Surgeon: Rogene Houston, MD;  [facility];  Service: Endoscopy;  Laterality: N/A;  . Cholecystectomy  ~ 2008  . Parastomal hernia repair  10/2013  . Incision and drainage abscess Left 06/22/2014    Procedure: INCISION, DEBRIDEMENT AND IRRIGATION LEFT HAND/WRIST;  Surgeon: Daryll Brod, MD;  Location: [facility];  Service: Orthopedics;  Laterality: Left;  . Incision and drainage of wound Left 07/27/2014    Procedure: IRRIGATION AND DEBRIDEMENT WOUND;  Surgeon: Daryll Brod, MD;  Location: [facility];  Service: Orthopedics;  Laterality: Left;  .  Incision and drainage abscess Left 08/09/2014    Procedure: INCISION AND DRAINAGE LEFT WRIST;  Surgeon: Daryll Brod, MD;  Location: [facility];  Service: Orthopedics;  Laterality: Left;  . Incision and drainage abscess Left 09/05/2014    Procedure: INCISION AND DRAINAGE LEFT HAND;  Surgeon: Daryll Brod, MD;  Location: [facility];  Service: Orthopedics;  Laterality: Left;  . I&d  extremity Left 10/02/2014    Procedure: IRRIGATION AND DEBRIDEMENT  LEFT HAND;  Surgeon: Daryll Brod, MD;  Location: [facility];  Service: Orthopedics;  Laterality: Left;  . I&d extremity Left 10/12/2014    Procedure: MINOR IRRIGATION AND DEBRIDEMENT LEFT HAND;  Surgeon: Daryll Brod, MD;  Location: [facility];  Service: Orthopedics;  Laterality: Left;  . Incision and drainage abscess Left 11/12/2014    Procedure: INCISION AND DRAINAGE ABSCESS LEFT HAND;  Surgeon: Daryll Brod, MD;  Location: [facility];  Service: Orthopedics;  Laterality: Left;  . Incision and drainage Left 11/27/2014    Procedure: INCISION AND DRAINAGE LEFT HAND;  Surgeon: Daryll Brod, MD;  Location: [facility];  Service: Orthopedics;  Laterality: Left;  . Tonsillectomy  1963  . Colon surgery  ~ 2010    for a colonic stricture at the anastomosis from a colostomy reversal/notes 10/03/2013  . Parastomal hernia repair  11/02/2013  . Orif distal radius fracture Right 06/29/2006  . Ileostomy    . Ileoscopy  07/02/2010  . I&d extremity Left 01/01/2015    Procedure: IRRIGATION AND DEBRIDEMENT LEFT HAND/WRIST;  Surgeon: Daryll Brod, MD;  Location: [facility];  Service: Orthopedics;  Laterality: Left;    Social History:  reports that he quit smoking about 9 years ago. He has never used smokeless tobacco. He reports that he drinks alcohol. He reports that he does not use illicit drugs.   No family history on file.  Allergies  Allergen Reactions  . Amikacin Other (See Comments)    CAUSED DEAFNESS  . Cefoxitin Nausea Only    ABD. PAIN  . Fish Allergy Nausea And Vomiting  . Iodine Nausea Only    JOINT PAIN  . Ivp Dye [Iodinated Diagnostic Agents] Nausea Only    JOINT PAIN  . Rifampin Nausea And Vomiting and Other (See Comments)    CHILLS  . Shellfish Allergy Swelling  . Vancomycin Other (See Comments)    Red Man Syndrome - was told that it was given to fast.  .  Demerol Rash     Medications: I have reviewed patients current medications as documented in Epic Anti-infectives    Start     Dose/Rate Route Frequency Ordered Stop   06/20/2015 2000  tigecycline (TYGACIL) 50 mg in sodium chloride 0.9 % 100 mL IVPB  Status:  Discontinued     50 mg 200 mL/hr over 30 Minutes Intravenous Every 12 hours 06/23/2015 1511 07/08/15 1217   07/09/2015 1600  dextrose 5 % 300 mL with azithromycin (ZITHROMAX) 600 mg infusion  Status:  Discontinued     150 mL/hr  Intravenous Continuous 07/03/2015 1521 06/29/2015 1525   06/19/2015 1600  dextrose 5 % 300 mL with azithromycin (ZITHROMAX) 600 mg infusion     150 mL/hr  Intravenous Every 24 hours 06/26/2015 1525     07/02/2015 1530  azithromycin (ZITHROMAX) 500 mg in dextrose 5 % 250 mL IVPB  Status:  Discontinued     500 mg 250 mL/hr over 60 Minutes Intravenous Every 24 hours 07/15/2015 1517  06/28/2015 1521         ROS:as in HPI otherwise remainder of 12 point Review of Systems is negative    Blood pressure 148/71, pulse 86, temperature 97.8 F (36.6 C), temperature source Oral, resp. rate 18, height _0  (1.778 m), weight 169 lb 15.6 oz (77.1 kg), SpO2 98 %. General: Alert and awake, oriented x3, not in any acute distress. HEENT: anicteric sclera,  EOMI, oropharynx clear and without exudate he is deaf Cardiovascular: regular rate, normal r,  no murmur rubs or gallops Pulmonary: clear to auscultation bilaterally, no wheezing, rales or rhonchi Gastrointestinal: soft , nondistended, normal bowel sounds, not as tender today vs other days he tells me Musculoskeletal: left hand is stable Neuro: nonfocal, strength and sensation intact   Results for orders placed or performed during the hospital encounter of 07/13/2015 (from the past 48 hour(s))  CBC     Status: Abnormal   Collection Time: 07/07/15  5:31 AM  Result Value Ref Range   WBC 15.4 (H) 4.0 - 10.5 K/uL   RBC 3.22 (L) 4.22 - 5.81 MIL/uL   Hemoglobin 10.6 (L) 13.0 - 17.0 g/dL     HCT 32.0 (L) 39.0 - 52.0 %   MCV 99.4 78.0 - 100.0 fL   MCH 32.9 26.0 - 34.0 pg   MCHC 33.1 30.0 - 36.0 g/dL   RDW 18.1 (H) 11.5 - 15.5 %   Platelets 93 (L) 150 - 400 K/uL    Comment: CONSISTENT WITH PREVIOUS RESULT  Comprehensive metabolic panel     Status: Abnormal   Collection Time: 07/07/15  8:04 AM  Result Value Ref Range   Sodium 136 135 - 145 mmol/L   Potassium 4.4 3.5 - 5.1 mmol/L   Chloride 111 101 - 111 mmol/L   CO2 20 (L) 22 - 32 mmol/L   Glucose, Bld 56 (L) 65 - 99 mg/dL   BUN 16 6 - 20 mg/dL   Creatinine, Ser 1.22 0.61 - 1.24 mg/dL   Calcium 7.1 (L) 8.9 - 10.3 mg/dL   Total Protein 3.3 (L) 6.5 - 8.1 g/dL   Albumin 1.4 (L) 3.5 - 5.0 g/dL   AST 56 (H) 15 - 41 U/L   ALT 59 17 - 63 U/L   Alkaline Phosphatase 291 (H) 38 - 126 U/L   Total Bilirubin 1.6 (H) 0.3 - 1.2 mg/dL   GFR calc non Af Amer >60 >60 mL/min   GFR calc Af Amer >60 >60 mL/min    Comment: (NOTE) The eGFR has been calculated using the CKD EPI equation. This calculation has not been validated in all clinical situations. eGFR's persistently <60 mL/min signify possible Chronic Kidney Disease.    Anion gap 5 5 - 15  CBC     Status: Abnormal   Collection Time: 07/08/15  5:41 AM  Result Value Ref Range   WBC 13.4 (H) 4.0 - 10.5 K/uL   RBC 3.00 (L) 4.22 - 5.81 MIL/uL   Hemoglobin 10.6 (L) 13.0 - 17.0 g/dL    Comment: CONSISTENT WITH PREVIOUS RESULT   HCT 30.5 (L) 39.0 - 52.0 %   MCV 101.7 (H) 78.0 - 100.0 fL   MCH 35.3 (H) 26.0 - 34.0 pg   MCHC 34.8 30.0 - 36.0 g/dL   RDW 18.1 (H) 11.5 - 15.5 %   Platelets 81 (L) 150 - 400 K/uL    Comment: CONSISTENT WITH PREVIOUS RESULT  Vitamin B12     Status: Abnormal   Collection Time: 07/08/15  5:41 AM  Result Value Ref Range   Vitamin B-12 1071 (H) 180 - 914 pg/mL    Comment: (NOTE) This assay is not validated for testing neonatal or myeloproliferative syndrome specimens for Vitamin B12 levels.   Comprehensive metabolic panel     Status: Abnormal    Collection Time: 07/08/15  5:41 AM  Result Value Ref Range   Sodium 142 135 - 145 mmol/L   Potassium 4.2 3.5 - 5.1 mmol/L   Chloride 114 (H) 101 - 111 mmol/L   CO2 20 (L) 22 - 32 mmol/L   Glucose, Bld 121 (H) 65 - 99 mg/dL   BUN 18 6 - 20 mg/dL   Creatinine, Ser 1.28 (H) 0.61 - 1.24 mg/dL   Calcium 7.5 (L) 8.9 - 10.3 mg/dL   Total Protein 3.5 (L) 6.5 - 8.1 g/dL   Albumin 1.5 (L) 3.5 - 5.0 g/dL   AST 75 (H) 15 - 41 U/L   ALT 59 17 - 63 U/L   Alkaline Phosphatase 267 (H) 38 - 126 U/L   Total Bilirubin 1.3 (H) 0.3 - 1.2 mg/dL   GFR calc non Af Amer 58 (L) >60 mL/min   GFR calc Af Amer >60 >60 mL/min    Comment: (NOTE) The eGFR has been calculated using the CKD EPI equation. This calculation has not been validated in all clinical situations. eGFR's persistently <60 mL/min signify possible Chronic Kidney Disease.    Anion gap 8 5 - 15  Prealbumin     Status: Abnormal   Collection Time: 07/08/15  5:41 AM  Result Value Ref Range   Prealbumin 4.3 (L) 18 - 38 mg/dL  Magnesium     Status: None   Collection Time: 07/08/15  5:41 AM  Result Value Ref Range   Magnesium 1.7 1.7 - 2.4 mg/dL  Phosphorus     Status: Abnormal   Collection Time: 07/08/15  5:41 AM  Result Value Ref Range   Phosphorus 2.3 (L) 2.5 - 4.6 mg/dL  Differential     Status: Abnormal   Collection Time: 07/08/15  5:41 AM  Result Value Ref Range   Neutrophils Relative % 83 %   Neutro Abs 11.1 (H) 1.7 - 7.7 K/uL   Lymphocytes Relative 7 %   Lymphs Abs 0.9 0.7 - 4.0 K/uL   Monocytes Relative 11 %   Monocytes Absolute 1.4 (H) 0.1 - 1.0 K/uL   Eosinophils Relative 0 %   Eosinophils Absolute 0.0 0.0 - 0.7 K/uL   Basophils Relative 0 %   Basophils Absolute 0.0 0.0 - 0.1 K/uL  Triglycerides     Status: None   Collection Time: 07/08/15  5:41 AM  Result Value Ref Range   Triglycerides 66 <150 mg/dL  Lipase, blood     Status: None   Collection Time: 07/08/15 10:09 AM  Result Value Ref Range   Lipase 47 11 - 51 U/L    _0 (sdes,specrequest,cult,reptstatus)   )No results found for this or any previous visit (from the past 720 hour(s)).   Impression/Recommendation  Principal Problem:   Acute pancreatitis Active Problems:   Crohn's disease ([place])   Atypical mycobacterial infection of hand   AKI (acute kidney injury) ([place])   PICC (peripherally inserted central catheter) in place   Chronic adrenal insufficiency ([facility])   Epigastric abdominal pain   Non-intractable cyclical vomiting with nausea   Anorexia   Abnormal loss of weight   Crohn's disease involving terminal ileum ([place])   Lee Klein is a 65  y.o. 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  #1 Nausea, abdominal pain, vomiting, possible pancreatitis: Discussed further with Heide Guile, Pharm D and we agreed that Tygacil would seem most likely culprit (and also one I would prefer to be the culprit)  --dc Tygacil --continue azithromycin and clofazamine from home (even if he cannot take with fatty meal to increase drug levels for now) --observe clinically and would consider repeat lipase --greatly appreciate GI help and IM team's throrough thoughtful care of Lee Klein  #2 M abscessus: needs MINIMUM of two drugs and would by textbook need one year post surgical control of the site. He has done well and no further surgeries since the fall  #3 Deafness: I am fine with cochlear implant. The infection in his hand is not relevant to insertion of cochlear implant   07/08/2015, 12:56 PM   Thank you so much for this interesting consult  [facility] [place] [phone] (pager) [phone] (office) 07/08/2015, 12:56 PM  Rhina [place] 07/08/2015, 12:56 PM

## 2015-07-09 ENCOUNTER — Encounter (HOSPITAL_COMMUNITY): Payer: Self-pay | Admitting: Physician Assistant

## 2015-07-09 ENCOUNTER — Inpatient Hospital Stay (HOSPITAL_COMMUNITY): Payer: Managed Care, Other (non HMO)

## 2015-07-09 DIAGNOSIS — J810 Acute pulmonary edema: Secondary | ICD-10-CM | POA: Insufficient documentation

## 2015-07-09 DIAGNOSIS — R000 Tachycardia, unspecified: Secondary | ICD-10-CM | POA: Insufficient documentation

## 2015-07-09 DIAGNOSIS — Z8719 Personal history of other diseases of the digestive system: Secondary | ICD-10-CM | POA: Insufficient documentation

## 2015-07-09 DIAGNOSIS — E872 Acidosis, unspecified: Secondary | ICD-10-CM | POA: Insufficient documentation

## 2015-07-09 DIAGNOSIS — R1012 Left upper quadrant pain: Secondary | ICD-10-CM

## 2015-07-09 DIAGNOSIS — J9601 Acute respiratory failure with hypoxia: Secondary | ICD-10-CM

## 2015-07-09 DIAGNOSIS — K50014 Crohn's disease of small intestine with abscess: Secondary | ICD-10-CM

## 2015-07-09 DIAGNOSIS — K859 Acute pancreatitis without necrosis or infection, unspecified: Secondary | ICD-10-CM

## 2015-07-09 DIAGNOSIS — J80 Acute respiratory distress syndrome: Secondary | ICD-10-CM | POA: Insufficient documentation

## 2015-07-09 DIAGNOSIS — K853 Drug induced acute pancreatitis without necrosis or infection: Secondary | ICD-10-CM

## 2015-07-09 DIAGNOSIS — A419 Sepsis, unspecified organism: Secondary | ICD-10-CM | POA: Insufficient documentation

## 2015-07-09 LAB — URINALYSIS, ROUTINE W REFLEX MICROSCOPIC
Bilirubin Urine: NEGATIVE
GLUCOSE, UA: NEGATIVE mg/dL
KETONES UR: NEGATIVE mg/dL
LEUKOCYTES UA: NEGATIVE
NITRITE: NEGATIVE
PH: 5 (ref 5.0–8.0)
PROTEIN: NEGATIVE mg/dL
Specific Gravity, Urine: 1.008 (ref 1.005–1.030)

## 2015-07-09 LAB — GLUCOSE, CAPILLARY
GLUCOSE-CAPILLARY: 106 mg/dL — AB (ref 65–99)
GLUCOSE-CAPILLARY: 91 mg/dL (ref 65–99)
Glucose-Capillary: 100 mg/dL — ABNORMAL HIGH (ref 65–99)
Glucose-Capillary: 124 mg/dL — ABNORMAL HIGH (ref 65–99)
Glucose-Capillary: 84 mg/dL (ref 65–99)

## 2015-07-09 LAB — APTT
APTT: 43 s — AB (ref 24–37)
APTT: 95 s — AB (ref 24–37)

## 2015-07-09 LAB — POCT I-STAT 3, ART BLOOD GAS (G3+)
ACID-BASE DEFICIT: 7 mmol/L — AB (ref 0.0–2.0)
BICARBONATE: 17.9 meq/L — AB (ref 20.0–24.0)
O2 Saturation: 93 %
PCO2 ART: 35 mmHg (ref 35.0–45.0)
PH ART: 7.316 — AB (ref 7.350–7.450)
PO2 ART: 73 mmHg — AB (ref 80.0–100.0)
Patient temperature: 98.7
TCO2: 19 mmol/L (ref 0–100)

## 2015-07-09 LAB — MRSA PCR SCREENING: MRSA by PCR: NEGATIVE

## 2015-07-09 LAB — CALCIUM, IONIZED: Calcium, Ionized, Serum: 4.8 mg/dL (ref 4.5–5.6)

## 2015-07-09 LAB — URINE MICROSCOPIC-ADD ON: WBC, UA: NONE SEEN WBC/hpf (ref 0–5)

## 2015-07-09 LAB — BASIC METABOLIC PANEL
ANION GAP: 6 (ref 5–15)
BUN: 16 mg/dL (ref 6–20)
CALCIUM: 7.4 mg/dL — AB (ref 8.9–10.3)
CO2: 21 mmol/L — ABNORMAL LOW (ref 22–32)
CREATININE: 0.98 mg/dL (ref 0.61–1.24)
Chloride: 110 mmol/L (ref 101–111)
GLUCOSE: 107 mg/dL — AB (ref 65–99)
POTASSIUM: 4.2 mmol/L (ref 3.5–5.1)
SODIUM: 137 mmol/L (ref 135–145)

## 2015-07-09 LAB — COMPREHENSIVE METABOLIC PANEL
ALBUMIN: 1.5 g/dL — AB (ref 3.5–5.0)
ALT: 60 U/L (ref 17–63)
ANION GAP: 8 (ref 5–15)
AST: 68 U/L — AB (ref 15–41)
Alkaline Phosphatase: 300 U/L — ABNORMAL HIGH (ref 38–126)
BUN: 19 mg/dL (ref 6–20)
CHLORIDE: 108 mmol/L (ref 101–111)
CO2: 19 mmol/L — AB (ref 22–32)
Calcium: 7.3 mg/dL — ABNORMAL LOW (ref 8.9–10.3)
Creatinine, Ser: 1.41 mg/dL — ABNORMAL HIGH (ref 0.61–1.24)
GFR calc Af Amer: >60 mL/min (ref >60)
GFR calc non Af Amer: 52 mL/min — ABNORMAL LOW (ref >60)
GLUCOSE: 304 mg/dL — AB (ref 65–99)
POTASSIUM: 3.9 mmol/L (ref 3.5–5.1)
SODIUM: 135 mmol/L (ref 135–145)
Total Bilirubin: 1.8 mg/dL — ABNORMAL HIGH (ref 0.3–1.2)
Total Protein: 3.6 g/dL — ABNORMAL LOW (ref 6.5–8.1)

## 2015-07-09 LAB — CBC
HCT: 32.4 % — ABNORMAL LOW (ref 39.0–52.0)
HEMATOCRIT: 36 % — AB (ref 39.0–52.0)
HEMOGLOBIN: 11.5 g/dL — AB (ref 13.0–17.0)
HEMOGLOBIN: 11.9 g/dL — AB (ref 13.0–17.0)
MCH: 36.3 pg — AB (ref 26.0–34.0)
MCH: 33.1 pg (ref 26.0–34.0)
MCHC: 35.5 g/dL (ref 30.0–36.0)
MCHC: 33.1 g/dL (ref 30.0–36.0)
MCV: 102.2 fL — ABNORMAL HIGH (ref 78.0–100.0)
MCV: 100 fL (ref 78.0–100.0)
PLATELETS: 57 10*3/uL — AB (ref 150–400)
Platelets: 43 10*3/uL — ABNORMAL LOW (ref 150–400)
RBC: 3.17 MIL/uL — AB (ref 4.22–5.81)
RBC: 3.6 MIL/uL — ABNORMAL LOW (ref 4.22–5.81)
RDW: 17.6 % — ABNORMAL HIGH (ref 11.5–15.5)
RDW: 17.3 % — ABNORMAL HIGH (ref 11.5–15.5)
WBC: 19.9 10*3/uL — AB (ref 4.0–10.5)
WBC: 3.8 10*3/uL — ABNORMAL LOW (ref 4.0–10.5)

## 2015-07-09 LAB — PHOSPHORUS: Phosphorus: 2.5 mg/dL (ref 2.5–4.6)

## 2015-07-09 LAB — DIFFERENTIAL
BASOS ABS: 0 10*3/uL (ref 0.0–0.1)
BASOS PCT: 0 %
Eosinophils Absolute: 0 10*3/uL (ref 0.0–0.7)
Eosinophils Relative: 0 %
LYMPHS PCT: 3 %
Lymphs Abs: 0.1 10*3/uL — ABNORMAL LOW (ref 0.7–4.0)
MONOS PCT: 2 %
Monocytes Absolute: 0.1 10*3/uL (ref 0.1–1.0)
NEUTROS ABS: 3.5 10*3/uL (ref 1.7–7.7)
Neutrophils Relative %: 94 %

## 2015-07-09 LAB — BLOOD GAS, ARTERIAL
ACID-BASE DEFICIT: 10.9 mmol/L — AB (ref 0.0–2.0)
Bicarbonate: 14.7 mEq/L — ABNORMAL LOW (ref 20.0–24.0)
DRAWN BY: 441371
FIO2: 100
O2 Saturation: 72.3 %
PATIENT TEMPERATURE: 98.6
TCO2: 15.7 mmol/L (ref 0–100)
pCO2 arterial: 33.4 mmHg — ABNORMAL LOW (ref 35.0–45.0)
pH, Arterial: 7.266 — ABNORMAL LOW (ref 7.350–7.450)
pO2, Arterial: 47.9 mmHg — ABNORMAL LOW (ref 80.0–100.0)

## 2015-07-09 LAB — PROTIME-INR
INR: 2.1 — AB (ref 0.00–1.49)
PROTHROMBIN TIME: 23.4 s — AB (ref 11.6–15.2)

## 2015-07-09 LAB — TROPONIN I
Troponin I: 0.03 ng/mL (ref <0.031)
Troponin I: 0.06 ng/mL — ABNORMAL HIGH (ref <0.031)

## 2015-07-09 LAB — IRON AND TIBC: IRON: 8 ug/dL — AB (ref 45–182)

## 2015-07-09 LAB — MAGNESIUM: Magnesium: 2 mg/dL (ref 1.7–2.4)

## 2015-07-09 LAB — LACTIC ACID, PLASMA: LACTIC ACID, VENOUS: 6 mmol/L — AB (ref 0.5–2.0)

## 2015-07-09 MED ORDER — DEXTROSE 10 % IV SOLN
INTRAVENOUS | Status: DC
  Administered 2015-07-09: 11:00:00 via INTRAVENOUS

## 2015-07-09 MED ORDER — FAT EMULSION 20 % IV EMUL
240.0000 mL | INTRAVENOUS | Status: DC
  Filled 2015-07-09: qty 250

## 2015-07-09 MED ORDER — MAGNESIUM SULFATE IN D5W 10-5 MG/ML-% IV SOLN
1.0000 g | Freq: Once | INTRAVENOUS | Status: AC
  Administered 2015-07-09: 1 g via INTRAVENOUS
  Filled 2015-07-09: qty 100

## 2015-07-09 MED ORDER — DEXTROSE 5 % IV SOLN
20.0000 mmol | Freq: Once | INTRAVENOUS | Status: AC
  Administered 2015-07-09: 20 mmol via INTRAVENOUS
  Filled 2015-07-09: qty 6.67

## 2015-07-09 MED ORDER — FUROSEMIDE 10 MG/ML IJ SOLN: 40.0000 mg | Freq: Once | INTRAMUSCULAR | Status: DC

## 2015-07-09 MED ORDER — FAT EMULSION 20 % IV EMUL
240.0000 mL | INTRAVENOUS | Status: AC
  Administered 2015-07-09: 240 mL via INTRAVENOUS
  Filled 2015-07-09: qty 250

## 2015-07-09 MED ORDER — FUROSEMIDE 10 MG/ML IJ SOLN
40.0000 mg | Freq: Once | INTRAMUSCULAR | Status: AC
  Administered 2015-07-09: 40 mg via INTRAVENOUS

## 2015-07-09 MED ORDER — HYDROCORTISONE NA SUCCINATE PF 250 MG IJ SOLR
200.0000 mg | Freq: Four times a day (QID) | INTRAMUSCULAR | Status: AC
  Administered 2015-07-09 – 2015-07-10 (×3): 200 mg via INTRAVENOUS
  Filled 2015-07-09 (×3): qty 200

## 2015-07-09 MED ORDER — SODIUM BICARBONATE 8.4 % IV SOLN
INTRAVENOUS | Status: AC
Start: 2015-07-09 — End: 2015-07-09
  Filled 2015-07-09: qty 50

## 2015-07-09 MED ORDER — VANCOMYCIN HCL IN DEXTROSE 750-5 MG/150ML-% IV SOLN
750.0000 mg | Freq: Two times a day (BID) | INTRAVENOUS | Status: DC
  Administered 2015-07-10: 750 mg via INTRAVENOUS
  Filled 2015-07-09 (×2): qty 150

## 2015-07-09 MED ORDER — CHLORHEXIDINE GLUCONATE 0.12 % MT SOLN
15.0000 mL | Freq: Two times a day (BID) | OROMUCOSAL | Status: DC
  Administered 2015-07-09 – 2015-07-16 (×13): 15 mL via OROMUCOSAL

## 2015-07-09 MED ORDER — BUMETANIDE 0.25 MG/ML IJ SOLN
1.0000 mg | Freq: Once | INTRAMUSCULAR | Status: AC
  Administered 2015-07-09: 1 mg via INTRAVENOUS
  Filled 2015-07-09: qty 4

## 2015-07-09 MED ORDER — SODIUM CHLORIDE 0.9 % IV SOLN
1.0000 g | Freq: Three times a day (TID) | INTRAVENOUS | Status: DC
  Administered 2015-07-09 – 2015-07-12 (×9): 1 g via INTRAVENOUS
  Filled 2015-07-09 (×12): qty 1

## 2015-07-09 MED ORDER — HYDROMORPHONE HCL 1 MG/ML IJ SOLN: 1.0000 mg | Freq: Once | INTRAMUSCULAR | Status: DC

## 2015-07-09 MED ORDER — TRACE MINERALS CR-CU-MN-SE-ZN 10-1000-500-60 MCG/ML IV SOLN
INTRAVENOUS | Status: DC
  Filled 2015-07-09: qty 1920

## 2015-07-09 MED ORDER — DIPHENHYDRAMINE HCL 50 MG/ML IJ SOLN
50.0000 mg | Freq: Once | INTRAMUSCULAR | Status: AC
  Administered 2015-07-10: 50 mg via INTRAVENOUS
  Filled 2015-07-09: qty 1

## 2015-07-09 MED ORDER — FUROSEMIDE 10 MG/ML IJ SOLN
INTRAMUSCULAR | Status: AC
  Filled 2015-07-09: qty 4

## 2015-07-09 MED ORDER — CETYLPYRIDINIUM CHLORIDE 0.05 % MT LIQD
7.0000 mL | Freq: Two times a day (BID) | OROMUCOSAL | Status: DC
  Administered 2015-07-10 – 2015-07-17 (×14): 7 mL via OROMUCOSAL

## 2015-07-09 MED ORDER — SODIUM BICARBONATE 8.4 % IV SOLN
50.0000 meq | Freq: Once | INTRAVENOUS | Status: AC
  Administered 2015-07-09: 50 meq via INTRAVENOUS
  Filled 2015-07-09: qty 50

## 2015-07-09 MED ORDER — TRACE MINERALS CR-CU-MN-SE-ZN 10-1000-500-60 MCG/ML IV SOLN
INTRAVENOUS | Status: AC
  Administered 2015-07-09: 21:00:00 via INTRAVENOUS
  Filled 2015-07-09: qty 1920

## 2015-07-09 MED ORDER — SODIUM CHLORIDE 0.9 % IV SOLN
0.0800 mg/kg/h | INTRAVENOUS | Status: DC
  Administered 2015-07-09: 0.1 mg/kg/h via INTRAVENOUS
  Filled 2015-07-09: qty 250

## 2015-07-09 MED ORDER — HYDROCORTISONE NA SUCCINATE PF 100 MG IJ SOLR
20.0000 mg | Freq: Every evening | INTRAMUSCULAR | Status: DC
Start: 2015-07-11 — End: 2015-07-09

## 2015-07-09 MED ORDER — HYDROCORTISONE NA SUCCINATE PF 100 MG IJ SOLR
40.0000 mg | Freq: Every day | INTRAMUSCULAR | Status: DC
  Administered 2015-07-11 – 2015-07-22 (×12): 40 mg via INTRAVENOUS
  Filled 2015-07-09 (×6): qty 0.8
  Filled 2015-07-09: qty 2
  Filled 2015-07-09 (×4): qty 0.8
  Filled 2015-07-09: qty 2
  Filled 2015-07-09 (×2): qty 0.8
  Filled 2015-07-09: qty 2

## 2015-07-09 MED ORDER — VANCOMYCIN HCL 10 G IV SOLR
2000.0000 mg | Freq: Once | INTRAVENOUS | Status: AC
  Administered 2015-07-09: 2000 mg via INTRAVENOUS
  Filled 2015-07-09: qty 2000

## 2015-07-09 MED ORDER — HYDROCORTISONE NA SUCCINATE PF 100 MG IJ SOLR
20.0000 mg | Freq: Every evening | INTRAMUSCULAR | Status: DC
  Administered 2015-07-11 – 2015-07-21 (×11): 20 mg via INTRAVENOUS
  Filled 2015-07-09: qty 0.4
  Filled 2015-07-09: qty 0
  Filled 2015-07-09 (×6): qty 0.4
  Filled 2015-07-09: qty 2
  Filled 2015-07-09 (×5): qty 0.4

## 2015-07-09 NOTE — Progress Notes (Signed)
Daily Rounding Note  07/09/2015, 9:56 AM  LOS: 4 days   SUBJECTIVE:       Pt having burning pain into chest.  Relieved with Dilaudid but drug makes him crazy and talk out of his head.  Ostomy output is reddish, the wife says it is from the abx.   No vomiting today  OBJECTIVE:         Vital signs in last 24 hours:    Temp:  [97.5 F (36.4 C)-98.3 F (36.8 C)] 98.1 F (36.7 C) (02/21 0500) Pulse Rate:  [72-85] 85 (02/21 0500) Resp:  [16-18] 16 (02/21 0500) BP: (128-142)/(68-71) 128/69 mmHg (02/21 0500) SpO2:  [89 %-97 %] 96 % (02/21 0547) Weight:  [77.9 kg (171 lb 11.8 oz)] 77.9 kg (171 lb 11.8 oz) (02/21 0500)   Filed Weights   07/07/15 0441 07/08/15 0500 07/09/15 0500  Weight: 75.6 kg (166 lb 10.7 oz) 77.1 kg (169 lb 15.6 oz) 77.9 kg (171 lb 11.8 oz)   General: looks acutely and chronically very ill.  cushingoid   Heart: RRR Chest: clear bil.  But dimiinished BS.   Abdomen: soft, mild non focal, diffuse tenderness.  Hypoactive BS.  Ostomy liquid stool is rusty colored  Extremities: 2 plus LE edema, anasarca of limbs Neuro/Psych:  Did not attempt to assess orientation or limb strength.  Moves all 4s.    Intake/Output from previous day: 02/20 0701 - 02/21 0700 In: 753.2 [P.O.:120; I.V.:10; TPN:623.2] Out: 550 [Urine:550]  Intake/Output this shift:    Lab Results:  Recent Labs  07/07/15 0531 07/08/15 0541 07/09/15 0440  WBC 15.4* 13.4* 19.9*  HGB 10.6* 10.6* 11.5*  HCT 32.0* 30.5* 32.4*  PLT 93* 81* 57*   BMET  Recent Labs  07/07/15 0804 07/08/15 0541 07/09/15 0440  NA 136 142 137  K 4.4 4.2 4.2  CL 111 114* 110  CO2 20* 20* 21*  GLUCOSE 56* 121* 107*  BUN 16 18 16   CREATININE 1.22 1.28* 0.98  CALCIUM 7.1* 7.5* 7.4*   LFT  Recent Labs  07/07/15 0804 07/08/15 0541  PROT 3.3* 3.5*  ALBUMIN 1.4* 1.5*  AST 56* 75*  ALT 59 59  ALKPHOS 291* 267*  BILITOT 1.6* 1.3*   PT/INR No results  for input(s): LABPROT, INR in the last 72 hours. Hepatitis Panel No results for input(s): HEPBSAG, HCVAB, HEPAIGM, HEPBIGM in the last 72 hours.  Studies/Results: No results found.   Scheduled Meds: . research study medication  100 mg Oral QHS  . dextrose 5 % 300 mL with azithromycin (ZITHROMAX) 600 mg infusion   Intravenous Q24H  . hydrocortisone sod succinate (SOLU-CORTEF) inj  20 mg Intravenous QPM  . hydrocortisone sod succinate (SOLU-CORTEF) inj  40 mg Intravenous QAC breakfast  . insulin aspart  0-9 Units Subcutaneous 4 times per day  . magnesium sulfate 1 - 4 g bolus IVPB  1 g Intravenous Once  . ondansetron (ZOFRAN) IV  4 mg Intravenous 3 times per day  . pantoprazole (PROTONIX) IV  40 mg Intravenous Q24H  . pneumococcal 23 valent vaccine  0.5 mL Intramuscular Tomorrow-1000  . potassium phosphate IVPB (mmol)  20 mmol Intravenous Once  . sodium chloride  1,000 mL Intravenous Once  . sodium chloride flush  3 mL Intravenous Q12H   Continuous Infusions: . dextrose    . Marland KitchenTPN (CLINIMIX-E) Adult 40 mL/hr at 07/08/15 1735   And  . fat emulsion 240 mL (07/08/15 1736)  . Marland Kitchen  TPN (CLINIMIX-E) Adult     And  . fat emulsion     PRN Meds:.HYDROmorphone (DILAUDID) injection, LORazepam, promethazine, sodium chloride flush   ASSESMENT:   *  Acute pancreatitis (suggested on ultrasound), ? abx induced.  Tygacil discontinued. On Zithromax.  Hx pancreatitis in 10/2014 attributed to Defoxitin vs Tigecycline then.    Lipase improved. Bile ducts normal, GB out.  ? Drug induced liver injury on top of the drug induced pancreatitis.   *  Osteomyelitis, many operations for septic arthritis of hand.    *  Long hx Crohns.  Many surgeries including ileostomy.  Steroid dependent/adrenal insufficiency.  Dr Karilyn Cota is GI doc.   *  Chronic nausea, anorexia.  Malnutrition.  Started on TPN  *   Fatty liver.  Small amount of ascites on ultrasound.   *  Hearing loss.   *  Thrombocytopenia.    PLAN     *  Supportive care.  CT scan to be done today.     Lee Klein  07/09/2015, 9:56 AM Pager: [phone]

## 2015-07-09 NOTE — Progress Notes (Addendum)
SUBJECTIVE Went to reassess patient from this AM as he had become short of breath  Still with O2 requirement. CXR now with bilateral infiltrates. EKG with sinus tachycardia.  OBJECTIVE Filed Vitals:   07/08/15 2142 07/09/15 0500  BP: 142/71 128/69  Pulse: 73 85  Temp: 98.3 F (36.8 C) 98.1 F (36.7 C)  Resp: 18 16   General: anxious-appearing, tachypneic, NAD HEENT: EOMI, no scleral icterus Cardiac: tachycardic, no rubs, murmurs or gallops Pulm: clear to auscultation bilaterally, no wheezes, rales, or rhonchi  ASSESSMENT Acute respiratory failure. Considerations include pulmonary complications related to his acute pancreatitis, like ARDS. Bilateral infiltrates could also be in the setting of low albumin  PLAN -Called rapid RN -Consulted PCCM for evaluation -Give Lasix 80mg  IV to see if his O2 sats improve -Holding D10 for now

## 2015-07-09 NOTE — Significant Event (Signed)
Rapid Response Event Note  Overview: Time Called: 1022 Arrival Time: 1022 Event Type: Respiratory  Initial Focused Assessment: Patient in acute respiratory distress, increased work of breathing.  O2 sats 76% on NRB BP 130/110  ST 160s,  RR 32  O2 sat 76% on NRB Lung sounds with rhonchi in upper lobes Patient very hard of hearing, difficult to communicate with him MD at bedside Wife at bedside  Interventions: ABG done Placed on Bipap  lasix given IV Transferred to 2M04 via bed with heart monitor and bipap.  Event Summary: Name of Physician Notified: Residents at bedside at    Name of Consulting Physician Notified: CCM at 1130  Outcome: Transferred (Comment) [phone])  Event End Time: 1220  Marcellina Millin

## 2015-07-09 NOTE — Progress Notes (Signed)
Rt called to pt's room due to rapid response request, pt desat 76% on NRB 100%. RT collected ABG and placed pt on Bipap 100%, sat recovered to 95%. Pt transported to 2M04 on Bipap. Vitals remained stable throughout the transport.

## 2015-07-09 NOTE — Progress Notes (Signed)
rn notified 

## 2015-07-09 NOTE — Procedures (Signed)
Central Venous Catheter Insertion Procedure Note Lee Klein [id number] 1950-08-14  Procedure: Insertion of Central Venous Catheter Indications: Assessment of intravascular volume, Drug and/or fluid administration and Frequent blood sampling  Procedure Details Consent: Unable to obtain consent because of altered level of consciousness. Time Out: Verified patient identification, verified procedure, site/side was marked, verified correct patient position, special equipment/implants available, medications/allergies/relevent history reviewed, required imaging and test results available.  Performed  Maximum sterile technique was used including antiseptics, cap, gloves, gown, hand hygiene, mask and sheet. Skin prep: Chlorhexidine; local anesthetic administered A antimicrobial bonded/coated triple lumen catheter was placed in the left internal jugular vein using the Seldinger technique.  Evaluation Blood flow good Complications: No apparent complications Patient did tolerate procedure well. Chest X-ray ordered to verify placement.  CXR: pending.  Procedure performed under direct ultrasound guidance for real time vessel cannulation.      Rutherford Guys, [place] [facility] Pager: [phone]  or [phone] 07/09/2015, 7:47 PM

## 2015-07-09 NOTE — Progress Notes (Signed)
ANTICOAGULATION AND ANTIBIOTIC CONSULT NOTE - Initial Consult  Pharmacy Consult for Bivalirudin/vancomycin/meropenem Indication: possible HIT/DVT/sepsis  Allergies  Allergen Reactions  . Amikacin Other (See Comments)    CAUSED DEAFNESS  . Cefoxitin Nausea Only    ABD. PAIN  . Fish Allergy Nausea And Vomiting  . Iodine Nausea Only    JOINT PAIN  . Ivp Dye [Iodinated Diagnostic Agents] Nausea Only    JOINT PAIN  . Rifampin Nausea And Vomiting and Other (See Comments)    CHILLS  . Shellfish Allergy Swelling  . Vancomycin Other (See Comments)    Red Man Syndrome - was told that it was given to fast.  . Demerol Rash    Patient Measurements: Height: 5\' 10"  (177.8 cm) Weight: 171 lb 11.8 oz (77.9 kg) IBW/kg (Calculated) : 73  Vital Signs: Temp: 98.7 F (37.1 C) (02/21 1523) Temp Source: Axillary (02/21 1523) BP: 107/85 mmHg (02/21 1300) Pulse Rate: 154 (02/21 1300)  Labs:  Recent Labs  07/08/15 0541 07/09/15 0440 07/09/15 1233 07/09/15 1234  HGB 10.6* 11.5* 11.9*  --   HCT 30.5* 32.4* 36.0*  --   PLT 81* 57* 43*  --   APTT  --   --   --  43*  LABPROT  --   --   --  23.4*  INR  --   --   --  2.10*  CREATININE 1.28* 0.98 1.41*  --   TROPONINI  --   --  0.03  --     Estimated Creatinine Clearance: 56.1 mL/min (by C-G formula based on Cr of 1.41).   Medical History: Past Medical History  Diagnosis Date  . Crohn's disease (HCC)   . GERD (gastroesophageal reflux disease)   . Anxiety   . 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 PICC (peripherally inserted central catheter) in place     right arm  . Osteomyelitis of hand, left, acute (HCC)   . Hearing loss   . Pancreatitis 10/2014  . History of kidney stones   . History of pancreatitis 11/06/2014  . Mycobacterial infection     left hand/wrist  . Thin skin   . Septic arthritis of wrist, left (HCC)   . DVT (deep venous thrombosis) (HCC) 04/2015    left leg  . Pneumonia 04/2015   . Acute pancreatitis 07/08/2015  . AKI (acute kidney injury) (HCC) 07/08/2015  . PICC (peripherally inserted central catheter) in place     Assessment: 65 yo m with extensive past medical history.  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  On admission, he was found with pancreatitis, so tigecycline was stopped and clofazimine and azithromycin were continued.  Today, he rapidly decompensated and was transferred to [facility] for respiratory distress.  He is also on TPN. Pharmacy is now consulted to start vancomycin and meropenem for sepsis per ID Dr. Daiva Eves.  Will d/c clofazimine and azithromycin.   Vanc 2/21 >> Meropenem 2/21 >>  2/21 BCx: sent 2/21: MRSA PCR neg  Pt also has a recent history of a DVT in October 2016 - at home he is on Xarelto.  He has not been eating much, so likely not getting full effect of Xarelto.  His Xarelto was held on admission and he was started on Lovenox.  His platelets have been trending down, so pharmacy  is also consulted to start bivalirudin for possible HIT/DVT treatment.  Platelets 108>93>81>57>43. Hgb is stable ~11.   Goal of Therapy:  Vancomycin trough 15-20 mcg/mL aPTT 50-85 seconds Monitor platelets by anticoagulation protocol: Yes   Plan:  - Meropenem 1 gm IV q8h - Vancomycin 2,000 mg IV x 1 load - Then vancomycin 750 mg IV q12h - F/u VT at Css - Start bivalirudin at 0.1 mg/kg/hr - 2-hr aPTT @ 1800 - Monitor aPTTs q4h until 2 therapeutic levels then daily - Order HIT antibody and SRA assay - F/u platelets, cx, renal function, clinical course  Cassie L. Roseanne Reno, PharmD PGY2 Infectious Diseases Pharmacy Resident Pager: [phone] 07/09/2015 3:54 PM

## 2015-07-09 NOTE — Progress Notes (Signed)
ANTICOAGULATION CONSULT NOTE - Follow Up Consult  Pharmacy Consult for Bivalirudin Indication: DVT  Allergies  Allergen Reactions  . Amikacin Other (See Comments)    CAUSED DEAFNESS  . Cefoxitin Nausea Only    ABD. PAIN  . Fish Allergy Nausea And Vomiting  . Iodine Nausea Only    JOINT PAIN  . Ivp Dye [Iodinated Diagnostic Agents] Nausea Only    JOINT PAIN  . Rifampin Nausea And Vomiting and Other (See Comments)    CHILLS  . Shellfish Allergy Swelling  . Vancomycin Other (See Comments)    Red Man Syndrome - was told that it was given to fast.  . Demerol Rash    Patient Measurements: Height:  (177.8 cm) Weight: 171 lb 11.8 oz (77.9 kg) IBW/kg (Calculated) : 73  Vital Signs: Temp: 98.2 F (36.8 C) (02/21 2008) Temp Source: Axillary (02/21 2008) BP: 118/81 mmHg (02/21 1952) Pulse Rate: 109 (02/21 1952)  Labs:  Recent Labs  07/08/15 0541 07/09/15 0440 07/09/15 1233 07/09/15 1234 07/09/15 1842  HGB 10.6* 11.5* 11.9*  --   --   HCT 30.5* 32.4* 36.0*  --   --   PLT 81* 57* 43*  --   --   APTT  --   --   --  43* 95*  LABPROT  --   --   --  23.4*  --   INR  --   --   --  2.10*  --   CREATININE 1.28* 0.98 1.41*  --   --   TROPONINI  --   --  0.03  --  0.06*    Estimated Creatinine Clearance: 56.1 mL/min (by C-G formula based on Cr of 1.41).  Assessment:  AC: DVT in October 2016, on Xarelto PTA, last dose 2/16, was on hep gtt until 2/17, then lovenox, pts trending down so started Bivalirudin to r/o HIT and treat DVT; baseline aPTT 43, 1st aPTT=95 elevated   Goal of Therapy:  APTT 50-85s Monitor platelets by anticoagulation protocol: Yes   Plan:  - Decrease infusion rate by 20% to 0.08mg /kg/hr=12.5ml/hr - Recheck aPTT in 4 hrs.   Kenniya Westrich S. Merilynn Finland, PharmD, BCPS Clinical Staff Pharmacist Pager [phone]  Misty Stanley Stillinger 07/09/2015,8:21 PM

## 2015-07-09 NOTE — Progress Notes (Signed)
UR Completed. Kaian Fahs, RN, BSN.  [phone] 

## 2015-07-09 NOTE — Progress Notes (Signed)
Subjective:  Worsening abdominal pain this morning as well as difficulty taking a deep breath   Antibiotics:  Anti-infectives    Start     Dose/Rate Route Frequency Ordered Stop   07/10/15 0400  vancomycin (VANCOCIN) IVPB 750 mg/150 ml premix     750 mg 150 mL/hr over 60 Minutes Intravenous Every 12 hours 07/09/15 1545     07/09/15 1515  vancomycin (VANCOCIN) 2,000 mg in sodium chloride 0.9 % 500 mL IVPB     2,000 mg 250 mL/hr over 120 Minutes Intravenous  Once 07/09/15 1500     07/09/15 1515  meropenem (MERREM) 1 g in sodium chloride 0.9 % 100 mL IVPB     1 g 200 mL/hr over 30 Minutes Intravenous 3 times per day 07/09/15 1500     06/19/2015 2000  tigecycline (TYGACIL) 50 mg in sodium chloride 0.9 % 100 mL IVPB  Status:  Discontinued     50 mg 200 mL/hr over 30 Minutes Intravenous Every 12 hours 06/29/2015 1511 07/08/15 1217   06/22/2015 1600  dextrose 5 % 300 mL with azithromycin (ZITHROMAX) 600 mg infusion  Status:  Discontinued     150 mL/hr  Intravenous Continuous 07/07/2015 1521 06/25/2015 1525   06/19/2015 1600  dextrose 5 % 300 mL with azithromycin (ZITHROMAX) 600 mg infusion  Status:  Discontinued     150 mL/hr  Intravenous Every 24 hours 06/23/2015 1525 07/09/15 1500   07/04/2015 1530  azithromycin (ZITHROMAX) 500 mg in dextrose 5 % 250 mL IVPB  Status:  Discontinued     500 mg 250 mL/hr over 60 Minutes Intravenous Every 24 hours 06/21/2015 1517 06/22/2015 1521      Medications: Scheduled Meds: . antiseptic oral rinse  7 mL Mouth Rinse q12n4p  . chlorhexidine  15 mL Mouth Rinse BID  . furosemide      . furosemide      . hydrocortisone sod succinate (SOLU-CORTEF) inj  20 mg Intravenous QPM  . hydrocortisone sod succinate (SOLU-CORTEF) inj  40 mg Intravenous QAC breakfast  . insulin aspart  0-9 Units Subcutaneous 4 times per day  . meropenem (MERREM) IV  1 g Intravenous 3 times per day  . pantoprazole (PROTONIX) IV  40 mg Intravenous Q24H  . pneumococcal 23 valent vaccine   0.5 mL Intramuscular Tomorrow-1000  . potassium phosphate IVPB (mmol)  20 mmol Intravenous Once  . sodium chloride  1,000 mL Intravenous Once  . sodium chloride flush  3 mL Intravenous Q12H  . vancomycin  2,000 mg Intravenous Once  . [START ON 07/10/2015] vancomycin  750 mg Intravenous Q12H   Continuous Infusions: . bivalirudin (ANGIOMAX) infusion 0.5 mg/mL (Non-ACS indications) 0.1 mg/kg/hr (07/09/15 1541)  . Marland KitchenTPN (CLINIMIX-E) Adult 40 mL/hr at 07/08/15 1735   And  . fat emulsion 240 mL (07/08/15 1736)  . Marland KitchenTPN (CLINIMIX-E) Adult     And  . fat emulsion     PRN Meds:.HYDROmorphone (DILAUDID) injection, LORazepam, promethazine, sodium chloride flush    Objective: Weight change: 1 lb 12.2 oz (0.8 kg)  Intake/Output Summary (Last 24 hours) at 07/09/15 1659 Last data filed at 07/09/15 1600  Gross per 24 hour  Intake 1170.11 ml  Output    570 ml  Net 600.11 ml   Blood pressure 115/84, pulse 122, temperature 98.7 F (37.1 C), temperature source Axillary, resp. rate 19, height 5\' 10"  (1.778 m), weight 171 lb 11.8 oz (77.9 kg), SpO2 99 %. Temp:  [98.1 F (36.7  C)-98.7 F (37.1 C)] 98.7 F (37.1 C) (02/21 1523) Pulse Rate:  [73-154] 122 (02/21 1626) Resp:  [16-32] 19 (02/21 1626) BP: (107-142)/(69-110) 115/84 mmHg (02/21 1626) SpO2:  [54 %-100 %] 99 % (02/21 1626) FiO2 (%):  [80 %-100 %] 80 % (02/21 1626) Weight:  [171 lb 11.8 oz (77.9 kg)] 171 lb 11.8 oz (77.9 kg) (02/21 0500)  Physical Exam: General: Somnolent and difficult to arouse when I saw him this morning after dose of Dilaudid. Later in the afternoon when I came back to examine him in the ICU he was on the BiPAP but was able to recognize me when I motioned hello to him HEENT: anicteric sclera,  EOMI, deaf CVS tachy rate, normal r,  no murmur rubs or gallops Chest: diminished breath sounds at bases no wheezing, rales or rhonchi Abdomen: soft  TTP in the LUQ in particlar, no rebound Extremities:his surgical site is  clean Skin: echymoses Neuro: nonfocal  CBC: CBC Latest Ref Rng 07/09/2015 07/09/2015 07/08/2015  WBC 4.0 - 10.5 K/uL 3.8(L) 19.9(H) 13.4(H)  Hemoglobin 13.0 - 17.0 g/dL 11.9(L) 11.5(L) 10.6(L)  Hematocrit 39.0 - 52.0 % 36.0(L) 32.4(L) 30.5(L)  Platelets 150 - 400 K/uL 43(L) 57(L) 81(L)      BMET  Recent Labs  07/09/15 0440 07/09/15 1233  NA 137 135  K 4.2 3.9  CL 110 108  CO2 21* 19*  GLUCOSE 107* 304*  BUN 16 19  CREATININE 0.98 1.41*  CALCIUM 7.4* 7.3*     Liver Panel   Recent Labs  07/08/15 0541 07/09/15 1233  PROT 3.5* 3.6*  ALBUMIN 1.5* 1.5*  AST 75* 68*  ALT 59 60  ALKPHOS 267* 300*  BILITOT 1.3* 1.8*       Sedimentation Rate No results for input(s): ESRSEDRATE in the last 72 hours. C-Reactive Protein No results for input(s): CRP in the last 72 hours.  Micro Results: Recent Results (from the past 720 hour(s))  MRSA PCR Screening     Status: None   Collection Time: 07/09/15  2:02 PM  Result Value Ref Range Status   MRSA by PCR NEGATIVE NEGATIVE Final    Comment:        The GeneXpert MRSA Assay (FDA approved for NASAL specimens only), is one component of a comprehensive MRSA colonization surveillance program. It is not intended to diagnose MRSA infection nor to guide or monitor treatment for MRSA infections.     Studies/Results: Dg Chest Port 1 View  07/09/2015  CLINICAL DATA:  Dyspnea EXAM: PORTABLE CHEST 1 VIEW COMPARISON:  07/04/2014 FINDINGS: Right upper extremity PICC, tip at the upper right atrium in the setting of lower lung volumes. Symmetric bilateral airspace disease with peripheral sparing. The left diaphragm is obscured, likely by pleural fluid. Low lung volumes. No historical indication of hemoptysis. Borderline cardiomegaly. IMPRESSION: Extensive airspace disease with symmetry favoring edema over bilateral pneumonia. Electronically Signed   By: Marnee Spring M.D.   On: 07/09/2015 11:37      Assessment/Plan:  INTERVAL  HISTORY:   07/09/15: patient with worsening abdominal pain, difficulty breathing transfer to ICU for NIPPV   Principal Problem:   Acute pancreatitis Active Problems:   Crohn's disease (HCC)   Atypical mycobacterial infection of hand   AKI (acute kidney injury) (HCC)   PICC (peripherally inserted central catheter) in place   Chronic adrenal insufficiency (HCC)   Epigastric abdominal pain   Non-intractable cyclical vomiting with nausea   Anorexia   Abnormal loss of weight   Crohn's  disease involving terminal ileum (HCC)   Crohn disease (HCC)   Elevated LFTs   Osteomyelitis of right wrist (HCC)   Osteomyelitis of left wrist (HCC)   Pyogenic arthritis of left wrist (HCC)   Malnutrition of moderate degree   Acute hypoxemic respiratory failure (HCC)   Sinus tachycardia (HCC)   Sepsis (HCC)   Acute pulmonary edema (HCC)   Lactic acidosis    Lee Klein is a 65 y.o. 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 He is admitted for possible pancreatitis with an elevated lipase in the setting of nausea and vomiting. We had stopped his tigecycline yesterday in hopes that this was potentially the culprit antibiotic for causing his nausea vomiting and abdominal pain. Unfortunately his abdominal pain has not improved in the interim but in fact has worsened he now is developed difficulty breathing and has bilateral infiltrates on chest x-ray along with worsening hypotension and labs concerning for potential infection with sudden relative  leukopenia and worsening thrombocytopenia.  #1 Possible sepsis: Potential sources could be severe pancreatitis with necrosis and pancreatic abscess plus or minus ARDS. Healthcare associated pneumonia would seem rather unusual but it is possible certainly he could've had an  aspiration pneumonitis that blossomed though he was fairly broadly covered for most pathogens that might cause aspiration pneumonia with his chronic tigecycline.   I have ordered meropenem and vancomycin for now  I have discontinued the antibiotics for his hand infection with Mycobacterium abscesses  I would image abdomen with CT when able  #2 Nausea and vomiting with poor oral intake and potential pancreatitis with elevated lipase: We'd stop the tigecycline hoping this would ameliorate his symptoms but 1 day and to stopping this we have not seen improvement. Now that he is on the BiPAP machine he cannot take oral medications and therefore we will discontinue his entire regimen and stop his azithromycin and his clofazimine. Hopefully this will make a difference to his symptoms  I would image his abdomen with CT when able  #3 Bilateral infiltrates with hypoxia: agree this could be pulmonary edema from cardiogenic source, it could be aRDS and pancreatitis. It could be multi focal pneumonia. I found out from the family that he was not ingesting much food on this has an effect on the Xarelto so a number and was him due to subtherapeutic anticoagulation is possible though he was fully anticoagulated with heparin and then Lovenox over the last several days.  #4 thrombocytopenia: Concern for hit exists and primary team is managing he could have superimposed infection on top of this.  #5 Mycobacterium abscesses osteomyelitis and septic arthritis.: We are going to take a "holiday" from his anti-Mycobacterium antibiotics for now. Hopefully cure recovers from his acute illness and we can then revisit whether to rechallenge him again with these antibiotics or observe him off of them.  I spent greater than 60 minutes with the patient including greater than 50% of time in face to face counsel of the patient and his wife guarding his Mycobacterium abscesses osteomyelitis, his nausea vomiting and possible  pancreatitis his worsening abdominal pain worsening hypoxemia with bilateral pulmonary infiltrates leukopenia thrombocytopenia and in coordination of his care with internal medicine and CCM.  Greatly appreciate the diligent and careful attention he has received from both internal medicine and  Critical Care  LOS: 4 days   Acey Lav 07/09/2015, 4:59 PM

## 2015-07-09 NOTE — Progress Notes (Addendum)
PARENTERAL NUTRITION CONSULT NOTE - FOLLOW UP  Pharmacy Consult:  TPN Indication:  Severe pancreatitis/expected prolonged NPO status  Allergies  Allergen Reactions  . Amikacin Other (See Comments)    CAUSED DEAFNESS  . Cefoxitin Nausea Only    ABD. PAIN  . Fish Allergy Nausea And Vomiting  . Iodine Nausea Only    JOINT PAIN  . Ivp Dye [Iodinated Diagnostic Agents] Nausea Only    JOINT PAIN  . Rifampin Nausea And Vomiting and Other (See Comments)    CHILLS  . Shellfish Allergy Swelling  . Vancomycin Other (See Comments)    Red Man Syndrome - was told that it was given to fast.  . Demerol Rash    Patient Measurements: Height:  (177.8 cm) Weight: 171 lb 11.8 oz (77.9 kg) IBW/kg (Calculated) : 73  Weight from Dec 2016 = 69 kg  Vital Signs: Temp: 98.1 F (36.7 C) (02/21 0500) Temp Source: Oral (02/21 0500) BP: 128/69 mmHg (02/21 0500) Pulse Rate: 85 (02/21 0500) Intake/Output from previous day: 02/20 0701 - 02/21 0700 In: 753.2 [P.O.:120; I.V.:10; TPN:623.2] Out: 550 [Urine:550]  Labs:  Recent Labs  07/07/15 0531 07/08/15 0541 07/09/15 0440  WBC 15.4* 13.4* 19.9*  HGB 10.6* 10.6* 11.5*  HCT 32.0* 30.5* 32.4*  PLT 93* 81* 57*     Recent Labs  07/07/15 0804 07/08/15 0541 07/09/15 0440  NA 136 142 137  K 4.4 4.2 4.2  CL 111 114* 110  CO2 20* 20* 21*  GLUCOSE 56* 121* 107*  BUN CREATININE 1.22 1.28* 0.98  CALCIUM 7.1* 7.5* 7.4*  MG  --  1.7 2.0  PHOS  --  2.3* 2.5  PROT 3.3* 3.5*  --   ALBUMIN 1.4* 1.5*  --   AST 56* 75*  --   ALT 59 59  --   ALKPHOS 291* 267*  --   BILITOT 1.6* 1.3*  --   PREALBUMIN  --  4.3*  --   TRIG  --  66  --    Estimated Creatinine Clearance: 80.7 mL/min (by C-G formula based on Cr of 0.98).    Recent Labs  07/08/15 1809 07/09/15 0013 07/09/15 0552  GLUCAP 133* 124* 106*     Insulin Requirements in the past 24 hours:  2 units since TPN started  Assessment: 31 YOM with a history of Crohn's  disease s/p ileostomy presented on 06/27/2015 with intermittent epigastric abdominal pain, cramping, and vomiting for 2 months PTA.  Consulting ID to evaluate antibiotic therapy that could contribute to patient's symptoms.  Per documentation, patient is a poor candidate for surgical J-tube placement and while awaiting for antibiotic changes and resolution of GI symptoms for adequate PO intake, pharmacy consulted to manage TPN for nutritional support.  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  GI: hx Crohn's / GERD / pancreatitis - baseline prealbumin low at 4.3.  Scheduled Zofran + PRN Phenergan (QTc 451 ms).  Ileostomy O/P .  Considering Marinol to stimulate appetite. Endo: chronic AI, no hx DM - hypoglycemia prior to TPN initiation and while on steroid.  CBGs controlled post TPN initiation. Lytes: all WNL except mildly low CO2 Renal: SCr increased to 1.41, CrCL 56 ml/min - low UOP 0.3 ml/kr/hr, on D5NS at 125 ml/hr Pulm: on RA Cards: no hx - VSS - not on med AC: Xarelto  PTA for hx LLE DVT dx 02/2015, completed treatment course - mild anemia, plts decreased to 57K.  INR 2.1 Hepatobil: ALT normalized, others elevated.  Tbili improved to 1.8.  Lipase / TG WNL. Neuro: anxiety / hearing loss - possible cochlear implant ID: Azith/Clofazimine from PTA for hx M.abscessus osteo, Tygacil d/c'ed d/t possibility of causing pancreatitis (azithromycin, tigecycline, clofazimine study drug PTA) - afebrile, WBC increased to 19.9, LA 6 Best Practices: Lovenox TPN Access: PICC placed 10/19/14 (for long-term abx administration) TPN start date: 2/20 >>  Current Nutrition:  Clear liquid diet TPN  Nutritional Goals:  2100-2300 kCal and 115-130 grams of protein per day   Plan:  - Increase Clinimix E 5/15 to 80 ml/hr (goal rate 100 ml/hr) + ILE 20% at 10 ml/hr.  Cycle once stable at goal TPN rate. -  Daily multivitamin and trace elements - Continue sensitive SSI Q6H.  D/C if CBGs remain controlled at goal TPN rate - KPhos 20 mmol IV x 1 - Mag sulfate 1gm IV x 1 - Consider holding Lovenox for thrombocytopenia - F/U AM labs, respiratory status    Lee Klein D. Laney Potash, PharmD, BCPS Pager:  [phone] 07/09/2015, 1:36 PM    ===================================  Addendum: - patient transferred to the ICU d/t respiratory distress - TPN was held and D10W was started for unknown reason, D10W now stopped.   Plan: - A 2L Clinimix bag was already made for patient given the plan was to increase to 80 ml/hr prior to today's event.  Clinimix will be reduced to 40 ml/hr to ensure patient tolerates this rate before advancing. - Continue ILE 20% at 10 ml/hr   Lee Klein D. Laney Potash, PharmD, BCPS Pager:  [phone] 07/09/2015, 3:02 PM

## 2015-07-09 NOTE — Progress Notes (Signed)
Subjective:  Lee Klein initially felt well this morning without any new complaints, but ~10 minutes before we saw him on rounds this morning, he developed acute generalized chest burning that started when he was up going to the bathroom. He denies any vomiting but is still intermittently nauseous. Additionally, he became short of breath with O2 sats into low 80s. We placed him on 2L, obtained an EKG, ordered 2V CXR. We then went to reassess the patient ~11am and he was continued having labored breathing and becoming fatigued, so rapid response was called, portable CXR showed bilateral patchy pattern, ABG 7.26/33/48/15, EKG showing sinus tach. He was titrated up to NRB then BiPAP with O2 sats in the mid 90s. PCCM was consulted and he was transferred to MICU for management of his worsening respiratory status.  Objective: Vital signs in last 24 hours: Filed Vitals:   07/08/15 1400 07/08/15 2142 07/09/15 0500 07/09/15 0547  BP: 140/68 142/71 128/69   Pulse: 72 73 85   Temp: 97.5 F (36.4 C) 98.3 F (36.8 C) 98.1 F (36.7 C)   TempSrc: Oral Oral Oral   Resp: Height:      Weight:   171 lb 11.8 oz (77.9 kg)   SpO2: 96% 97% 89% 96%     Gen: Chronically ill-appearing, Caucasian male, alert and oriented to person, place, and time.  CV: Normal rate, regular rhythm, no murmurs, rubs, or gallops Pulmonary: Normal effort, CTA bilaterally, no crackles or wheezes Abdominal: Soft, moderate tenderness to palpation particularly in epigastrum without rebound or guarding. Hypoactive bowel sounds auscultated. Ostomy site c/d/i with pink-colored output.  Extremities: Distal pulses 2+ in upper and lower extremities bilaterally, no tenderness or erythema. 2+ pitting edema in LLE>RLE. PICC in R arm has some darkish discoloration around the adhesive site, but PICC insertion site is without any surrounding erythema, warmth, edema, or tenderness.  Lab Results: Basic Metabolic Panel:  Recent  Labs Lab 07/08/15 0541 07/09/15 0440  NA 142 137  K 4.2 4.2  CL 114* 110  CO2 20* 21*  GLUCOSE 121* 107*  BUN 18 16  CREATININE 1.28* 0.98  CALCIUM 7.5* 7.4*  MG 1.7 2.0  PHOS 2.3* 2.5   Liver Function Tests:  Recent Labs Lab 07/07/15 0804 07/08/15 0541  AST 56* 75*  ALT 59 59  ALKPHOS 291* 267*  BILITOT 1.6* 1.3*  PROT 3.3* 3.5*  ALBUMIN 1.4* 1.5*    Recent Labs Lab 07/27/15 1125 07/08/15 1009  LIPASE 336* 47   CBC:  Recent Labs Lab 07/06/15 0545  07/08/15 0541 07/09/15 0440  WBC 16.8*  < > 13.4* 19.9*  NEUTROABS 14.2*  --  11.1*  --   HGB 11.7*  < > 10.6* 11.5*  HCT 34.5*  < > 30.5* 32.4*  MCV 102.1*  < > 101.7* 102.2*  PLT 108*  < > 81* 57*  < > = values in this interval not displayed. Assessment/Plan: 1. Acute epigastric pain with nausea: Acute worsening of symptoms present and ongoing x 2 months. Most likely drug-induced pancreatitis with superimposed liver toxicity. Suspect antibiotics are the etiology if truly pancreatitis. Crohn's flare or AI are less likely. With acute worsening of symptoms this morning and rising WBC, there is concern for intraabdominal complications such as necrosis or pseudocyst, with other complications such as thromboembolism important considerations as well.  -Transfer to MICU -CXR (CTA IV contrast allergy "deathly" per wife), CT A/P with oral contrast -Dr. Clinton Gallant recs appreciated; d/c'ed  tigecycline -GI rec's TPN as we optimize medication regimen given severe protein malnutrition -On Dilaudid 1mg  q 3hrs PRN for pain -Scheduled zofran for -Will d/c MIVF for now given slight increase in LE edema, bolus PRN -Trend CBC & CMP  2. 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 Algis Liming, his infection is susceptible to tigecycline, clarithromycin, azithromycin, clofazimine.  -Continue azithro, tigecycline, clofazimine though it may be possible to drop  tigecycline per conversations with Dr. Daiva Eves though will defer to him  3. Crohn's on chronic pred complicated by adrenal insufficiency - No overt s/s AI here -Continue mild stress dose hydrocortisone IV 40mg  AM, 20mg  PM   4. Thrombocytopenia: Platelets 81 this morning, decreased from low 100s on admission. Heparin was stopped 2/18 as he had been adequately treated for a provoked DVT for 3 months with Xarelto, and he was switched over to Lovenox for DVT prophylaxis. Not B12 deficient. Of his antibiotics, azithromycin and tigecycline may also be contributory too. New infection unlikely.  -CBC as noted above  Dispo: Disposition is deferred at this time, awaiting improvement of current medical problems.  Anticipated discharge in approximately 1-3 day(s).   The patient does have a current PCP [name], MD) and does need an [facility] hospital follow-up appointment after discharge.  The patient does not have transportation limitations that hinder transportation to clinic appointments.  LOS: 4 days   Darrick Huntsman, MD 07/09/2015, 10:34 AM

## 2015-07-09 NOTE — Consult Note (Addendum)
PULMONARY / CRITICAL CARE MEDICINE   Name: Lee Klein MRN: [id number] DOB: 08/06/1950    ADMISSION DATE:  07/10/2015 CONSULTATION DATE:  07/09/15  REFERRING MD:  Dr. Oswaldo Klein /  Internal Medicine  CHIEF COMPLAINT:  Abdominal pain and vomiting   BRIEF PATIENT DESCRIPTION : The patient was admitted for acute on chronic nausea and vomiting. He has Crohn's disease. Also with significant comorbidities. 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 He was treated as drug-induced pancreatitis. GI and ID have been following. He had lactic acidosis. Had fluid resuscitation. Had resp distress on 2/21. Chest x-ray with bilateral infiltrates, new from admission. He went into respiratory distress. O2 sats 60% on non breather mask. Transferred to ICU.  ABG prior to transfer was 7.26, PCO2 33, PO2 48 on 100% nonrebreather mask. He received 80 mg IV Lasix. Patient is 12 L positive since admission.  SIGNIFICANT EVENTS:  06/21/2015 admitted for nausea vomiting, possible drug-induced pancreatitis 07/09/15 went into resp distress: O2 sats 60% on NRBM; transferred to ICU   STUDIES : [name] Abd (2/17) >> Heterogeneous echogenicity of pancreas which could due pancreatic inflammation; small ascites; fatty liver 2Decho (05/2014) >> EF 50%, CHFpEF  IMAGING : CXR (2/17) >> no active dse CXR (2/21) >> acute B infiltrates  CULTURES: Blood culture, MRSA screen, urine culture (2/21) >>   ANTIBIOTICS :  Clofazimine 2/17 >> Azithromycin 2/17 >>  Tigecycline 2/17 -2/20  LINES:  R PICC --since 03/2015 at least (wife does not remember)  HISTORY OF PRESENT ILLNESS:   Lee Klein is a 65 year old man with a history of Crohn's disease for 40 years status post an ileostomy and complicated by iatrogenic adrenal insufficiency from long-standing prednisone and deep venous thrombosis, left hand Mycobacterium abscesses osteomyelitis requiring long-term antibiotics and complicated by  aminoglycoside-induced deafness, and recent Serratia bacteremia who presents with a several month history of intermittent epigastric abdominal pain with nausea and vomiting which acutely worsened the day prior to admission after the patient received 3 doses of Lomotil for increased ostomy output that converted to no ostomy output. In the ED he was noted to have a significant leukocytosis, lactic acidosis, acute kidney injury, and elevated lipase consistent with pancreatitis and was therefore admitted to the internal medicine teaching service for further evaluation and care. He denies a history of alcohol use or hypertriglyceridemia. He had a cholecystectomy in the past and has had no problems since. He has had pancreatitis in the past.   PAST MEDICAL HISTORY :  He  has a past medical history of Crohn's disease (HCC); GERD (gastroesophageal reflux disease); Anxiety; Atypical mycobacterial infection of hand (05/2014); PICC (peripherally inserted central catheter) in place; Osteomyelitis of hand, left, acute (HCC); Hearing loss; Pancreatitis (10/2014); History of kidney stones; History of pancreatitis (11/06/2014); Mycobacterial infection; Thin skin; Septic arthritis of wrist, left (HCC); DVT (deep venous thrombosis) (HCC) (04/2015); Pneumonia (04/2015); Acute pancreatitis (07/08/2015); AKI (acute kidney injury) (HCC) (07/08/2015); and PICC (peripherally inserted central catheter) in place.  PAST SURGICAL HISTORY: He  has past surgical history that includes Colonoscopy; Appendectomy (~ 2009); Ileostomy closure (N/A, 10/23/2013); Wrist arthroscopy with debridement (Left, 05/24/2014); Esophagogastroduodenoscopy (N/A, 06/15/2014); Cholecystectomy (~ 2008); Parastomal hernia repair (10/2013); Incision and drainage abscess (Left, 06/22/2014); Incision and drainage of wound (Left, 07/27/2014); Incision and drainage abscess (Left, 08/09/2014); Incision and drainage abscess (Left, 09/05/2014); I&D extremity (Left, 10/02/2014); I&D  extremity (Left, 10/12/2014); Incision and drainage abscess (Left, 11/12/2014); Incision and drainage (Left, 11/27/2014); Tonsillectomy (1963); Colon  surgery (~ 2010); Parastomal hernia repair (11/02/2013); ORIF distal radius fracture (Right, 06/29/2006); Ileostomy; Ileoscopy (07/02/2010); and I&D extremity (Left, 01/01/2015).  Allergies  Allergen Reactions  . Amikacin Other (See Comments)    CAUSED DEAFNESS  . Cefoxitin Nausea Only    ABD. PAIN  . Fish Allergy Nausea And Vomiting  . Iodine Nausea Only    JOINT PAIN  . Ivp Dye [Iodinated Diagnostic Agents] Nausea Only    JOINT PAIN  . Rifampin Nausea And Vomiting and Other (See Comments)    CHILLS  . Shellfish Allergy Swelling  . Vancomycin Other (See Comments)    Red Man Syndrome - was told that it was given to fast.  . Demerol Rash    No current facility-administered medications on file prior to encounter.   Current Outpatient Prescriptions on File Prior to Encounter  Medication Sig  . ALPRAZolam (XANAX) 0.5 MG tablet TAKE 1 TABLET BY MOUTH three times  A DAY AS NEEDED FOR ANXIETY (Patient taking differently: Take 0.5 mg by mouth 2 (two) times daily. Morning and Afternoon)  . Ascorbic Acid (VITAMIN C) 1000 MG tablet Take 1,000 mg by mouth daily.  Marland Kitchen azithromycin (ZITHROMAX) 600 MG tablet TAKE 1 TABLET (600 MG TOTAL) BY MOUTH DAILY.  . Calcium Carbonate-Vitamin D (CALCIUM 600 + D PO) Take 1 tablet by mouth daily at 12 noon.   . CLOFAZIMINE PO Take 2 tablets by mouth at bedtime.   . feeding supplement, ENSURE ENLIVE, (ENSURE ENLIVE) LIQD Take 237 mLs by mouth 3 (three) times daily. (Patient taking differently: Take 237 mLs by mouth 2 (two) times daily between meals. )  . folic acid (FOLVITE) 1 MG tablet Take 1 mg by mouth daily.  Marland Kitchen HYDROcodone-acetaminophen (NORCO) 10-325 MG tablet Take 1 tablet by mouth every 6 (six) hours as needed. (Patient taking differently: Take 1 tablet by mouth every 6 (six) hours as needed for moderate pain or severe  pain. )  . loperamide (IMODIUM A-D) 2 MG tablet Take 8 mg by mouth 2 (two) times daily.   . Multiple Vitamins-Minerals (MULTIVITAMIN PO) Take 1 tablet by mouth daily.   Marland Kitchen nystatin (MYCOSTATIN/NYSTOP) 100000 UNIT/GM POWD Apply 1 g topically 3 (three) times a week. PRN  . pantoprazole (PROTONIX) 40 MG tablet Take 1 tablet (40 mg total) by mouth daily before breakfast.  . predniSONE (DELTASONE) 5 MG tablet Take 2 tablets (10 mg total) by mouth daily with breakfast.  . promethazine (PHENERGAN) 25 MG tablet Take 1 tablet (25 mg total) by mouth as needed for nausea or vomiting.  . tigecycline 50 mg in sodium chloride 0.9 % 100 mL Inject 50 mg into the vein every 12 (twelve) hours.  Carlena Hurl 20 MG TABS tablet Take 20 mg by mouth daily with supper.   . Zinc 50 MG CAPS Take 1 capsule by mouth daily.   Marland Kitchen azithromycin (ZITHROMAX) 600 MG tablet Take 1 tablet (600 mg total) by mouth daily. (Patient not taking: Reported on 06/22/2015)  . levalbuterol (XOPENEX) 1.25 MG/0.5ML nebulizer solution Take 1.25 mg by nebulization every 4 (four) hours as needed for wheezing or shortness of breath. (Patient not taking: Reported on 07/16/2015)  . mirtazapine (REMERON SOL-TAB) 15 MG disintegrating tablet Take 1 tablet (15 mg total) by mouth at bedtime. (Patient not taking: Reported on 06/28/2015)    FAMILY HISTORY:  His indicated that his mother is alive. He indicated that his father is deceased. He indicated that his daughter is alive. He indicated that his son is  alive.   SOCIAL HISTORY: He  reports that he quit smoking about 9 years ago. He has never used smokeless tobacco. He reports that he drinks alcohol. He reports that he does not use illicit drugs. Married. Limited info as he is deaf and wife is not around.    VITAL SIGNS: BP 130/110 mmHg  Pulse 138  Temp(Src) 98.1 F (36.7 C) (Oral)  Resp 32  Ht 5\' 10"  (1.778 m)  Wt 171 lb 11.8 oz (77.9 kg)  BMI 24.64 kg/m2  SpO2 96%  HEMODYNAMICS:    VENTILATOR  SETTINGS:  on Bipap 12/6  80% FiO2.   INTAKE / OUTPUT: I/O last 3 completed shifts: In: 753.2 [P.O.:120; I.V.:10] Out: 750 [Urine:750]   PHYSICAL EXAMINATION: General:  Wearing BiPAP. In respiratory distress but improved since being on BiPAP. Neuro:  Cranial nerves grossly intact. Moves extremities equally. No lateralizing sign. HEENT:  Pupils equal and reactive bilaterally. BiPAP mask on face. Unable to examine airway. No neck vein distention. No lymphadenopathy. Cardiovascular:  Tachycardic. Good S1 and S2. No S3 murmur rub or gallop. Lungs:  Increased work of breathing. Positive accessory muscle use but better with BiPAP. More comfortable on the BiPAP. Crackles mid to basis. Some wheezing. No rhonchi. No egophony elicited. Abdomen:  Soft. Decreased bowel sounds. Mildly distended. No masses or tenderness. (+) Ostomy in L abd.  Musculoskeletal:  Wallace Cullens 2 bilateral edema. Patient with chronic left hand osteomyelitis. Skin:  Warm and dry. No rash.  LABS:  BMET  Recent Labs Lab 07/07/15 0804 07/08/15 0541 07/09/15 0440  NA 136 142 137  K 4.4 4.2 4.2  CL 111 114* 110  CO2 20* 20* 21*  BUN 16 18 16   CREATININE 1.22 1.28* 0.98  GLUCOSE 56* 121* 107*    Electrolytes  Recent Labs Lab 07/07/15 0804 07/08/15 0541 07/09/15 0440  CALCIUM 7.1* 7.5* 7.4*  MG  --  1.7 2.0  PHOS  --  2.3* 2.5    CBC  Recent Labs Lab 07/07/15 0531 07/08/15 0541 07/09/15 0440  WBC 15.4* 13.4* 19.9*  HGB 10.6* 10.6* 11.5*  HCT 32.0* 30.5* 32.4*  PLT 93* 81* 57*    Coag's  Recent Labs Lab 07/06/15 0545  APTT >200*    Sepsis Markers  Recent Labs Lab 06/20/2015 1144 07/12/2015 1638 07/06/15 0010  LATICACIDVEN 3.89* 3.58* 1.7    ABG  Recent Labs Lab 07/09/15 1145  PHART 7.266*  PCO2ART 33.4*  PO2ART 47.9*    Liver Enzymes  Recent Labs Lab 07/06/15 0545 07/07/15 0804 07/08/15 0541  AST 61* 56* 75*  ALT 76* 59 59  ALKPHOS 333* 291* 267*  BILITOT 1.7* 1.6* 1.3*   ALBUMIN 1.6* 1.4* 1.5*    Cardiac Enzymes No results for input(s): TROPONINI, PROBNP in the last 168 hours.  Glucose  Recent Labs Lab 07/08/15 1809 07/09/15 0013 07/09/15 0552 07/09/15 0933 07/09/15 1219  GLUCAP 133* 124* 106* 100* 84    Imaging Dg Chest Port 1 View  07/09/2015  CLINICAL DATA:  Dyspnea EXAM: PORTABLE CHEST 1 VIEW COMPARISON:  07/04/2014 FINDINGS: Right upper extremity PICC, tip at the upper right atrium in the setting of lower lung volumes. Symmetric bilateral airspace disease with peripheral sparing. The left diaphragm is obscured, likely by pleural fluid. Low lung volumes. No historical indication of hemoptysis. Borderline cardiomegaly. IMPRESSION: Extensive airspace disease with symmetry favoring edema over bilateral pneumonia. Electronically Signed   By: Marnee Spring M.D.   On: 07/09/2015 11:37    DISCUSSION: The patient  was admitted with acute on chronic nausea and vomiting. He has Crohn's disease. Also with significant comorbidities. He has chronic left hand osteomyelitis (Mycobacteriun abscessus)  for which he is on chronic antibiotics. Since admission, he has been  treated as most likely drug-induced pancreatitis (Tigecycline).  On 2/21, pt had worsening resp status. Chest x-ray with bilateral infiltrates, new from admission. He went into respiratory distress. O2 sats 60% on non breather mask. Transferred to ICU.  ABG prior to transfer was 7.26, PCO2 33, PO2 48 on 100% nonrebreather mask. He received 80 mg IV Lasix. Patient is  12 L positive since admission.   ASSESSMENT / PLAN:  PULMONARY A: Acute hypoxemic respiratory failure secondary to the following: Pulmonary edema, volume overload, concern for healthcare associated pneumonia, sepsis/metabolic acidosis. Possible underlying COPD but no significant wheezing appreciated. Rule out acute coronary syndrome.  P:   --Patient is somewhat better on BiPAP 12/6. We were able to cut down the FiO2 from 100% to  70%. Plan to get a blood gas by 2 PM. Patient is still at risk for intubation. Depending on the blood gas, we will decide. --Patient is status post 80 mg IV Lasix. Monitor urine output. We'll need more Lasix. --Not sure if he's tachypeniec Because of Kussmaul's respirations 2 to metabolic acidosis. Need workup. Received 1 amp of bicarbonate. Will await repeat blood gas and go from there.   CARDIOVASCULAR A: Sinus tachycardia from underlying pulmonary edema, possible sepsis from likely healthcare associated pneumonia.  Patient with diastolic dysfunction based on 8295 echo. Not known to have CAD. Rule out acute coronary syndrome. -P:  --Heart rate in the 120-140. Will observe for now. He already received Lasix. May need more diuresis. --Check troponin, serial enzymes  RENAL A:  Acute kidney injury. Metabolic acidosis. Volume overload. P:   --Patient received 80 mg IV Lasix. Need to diuresis more but have to be cautious as creatinine is elevated --Received 1 amp of sodium bicarbonate. Await repeat blood gas. May need another amp of bicarbonate.  GASTROINTESTINAL A:  Crohn's disease requiring steroids. Drug-induced pancreatitis (Tigecycline)   Chronic nausea, vomiting. History of antibiotic related diarrhea. Severe malnutrition P:   --Continue TPN --GI following. --Continue steroids, IV.  HEMATOLOGIC A:  Thrombocytopenia, acute and chronic. Baseline was in the 140s. Possible drug induced. Possible HIT.  H/O left lower extremity DVT in October 2016. By history, unprovoked. He was on Xarelto outpatient. P:  -- Unlikely to use agratroban to to liver dse. Will consult pharmacy. Will likely need bivalirudin.   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 Consider healthcare  associated pneumonia. Consider sepsis.  P:   --On Azithromycin and Clofazimine.  --ID following. --Was considering adding antibiotics with vancomycin and cefepime for possible healthcare surrogate pneumonia. Awaiting ID input. Pt has a lot of allergies.  --Panculture, check lactic acid.  ENDOCRINE A:  Chronic adrenal insufficiency.  P:   --Continue IV steroids  NEUROLOGIC A:  Anxiety, chronic  P:   RASS goal: 0 Judicious use of benzos.  May need Precedex for anxiety.   FAMILY  - Updates: Updated wife.  - I spent at least 80 minutes of critical care time with this pt today  J. Angelo A. Christene Slates, MD Pulmonary and Critical Care Medicine [facility] Pager: [phone] After 3 pm or  if no response, call [phone]  07/09/2015, 12:42 PM Addendum 2/21 4:35 pm. Pt is significantly better. BP stable. HR from 150s down to 110. Tolerating BiPaP. Dec in crackles. Less WOB. More comfortable. Rpt ABG 7.31/35/73 on Bipap 12/6 and 70%. Only made 200 cc with lasix 80 IV (albumin is 1.5). Plan > as outlined. ID started Merem and Vanc. Will need more access  Plan for TLC. Will try Bumex 1 mg -- cant over diuresce as creat is higher. May need precedex for sedation if he gets agitated -- so far doing OK with prn dilaudid and ativan.  Dec FiO2 to 50-60%.

## 2015-07-09 NOTE — Progress Notes (Signed)
UR Completed. Kayda Allers, RN, BSN.  [phone] 

## 2015-07-09 NOTE — Care Management Note (Addendum)
Case Management Note  Patient Details  Name: [facility] LONGS MRN: [id number] Date of Birth: 04-Oct-1950  Subjective/Objective:      Pt transferred to ICU for respiratory distress requiring BIPAP     Action/Plan:    Pt discharged home in December 2016 with [facility] for antibiotics (IV Tygacil).  Pt is from home at wife [name] (RN), pt also has home O2 with [facility].  CM contacted agency and informed of admit.     Expected Discharge Date:                  Expected Discharge Plan:  Home w Home Health Services  In-House Referral:     Discharge planning Services  CM Consult  Post Acute Care Choice:  Resumption of Svcs/PTA Provider Choice offered to:  Patient  DME Arranged:    DME Agency:     HH Arranged:  RN HH Agency:  [facility]  Status of Service:  In process, will continue to follow  Medicare Important Message Given:    Date Medicare IM Given:    Medicare IM give by:    Date Additional Medicare IM Given:    Additional Medicare Important Message give by:     If discussed at Long Length of Stay Meetings, dates discussed:    Additional Comments:  Cherylann Parr, RN 07/09/2015, 3:52 PM

## 2015-07-10 ENCOUNTER — Encounter (HOSPITAL_COMMUNITY): Payer: Self-pay | Admitting: Radiology

## 2015-07-10 ENCOUNTER — Inpatient Hospital Stay (HOSPITAL_COMMUNITY): Payer: Managed Care, Other (non HMO)

## 2015-07-10 DIAGNOSIS — A419 Sepsis, unspecified organism: Secondary | ICD-10-CM | POA: Insufficient documentation

## 2015-07-10 DIAGNOSIS — E872 Acidosis: Secondary | ICD-10-CM

## 2015-07-10 DIAGNOSIS — N179 Acute kidney failure, unspecified: Secondary | ICD-10-CM

## 2015-07-10 DIAGNOSIS — R6521 Severe sepsis with septic shock: Secondary | ICD-10-CM

## 2015-07-10 DIAGNOSIS — A4150 Gram-negative sepsis, unspecified: Secondary | ICD-10-CM | POA: Insufficient documentation

## 2015-07-10 DIAGNOSIS — T80219A Unspecified infection due to central venous catheter, initial encounter: Secondary | ICD-10-CM

## 2015-07-10 DIAGNOSIS — M009 Pyogenic arthritis, unspecified: Secondary | ICD-10-CM

## 2015-07-10 LAB — HEPATIC FUNCTION PANEL
ALK PHOS: 218 U/L — AB (ref 38–126)
ALT: 54 U/L (ref 17–63)
AST: 79 U/L — ABNORMAL HIGH (ref 15–41)
Albumin: 1.4 g/dL — ABNORMAL LOW (ref 3.5–5.0)
BILIRUBIN DIRECT: 1.2 mg/dL — AB (ref 0.1–0.5)
BILIRUBIN INDIRECT: 1 mg/dL — AB (ref 0.3–0.9)
TOTAL PROTEIN: 3.5 g/dL — AB (ref 6.5–8.1)
Total Bilirubin: 2.2 mg/dL — ABNORMAL HIGH (ref 0.3–1.2)

## 2015-07-10 LAB — APTT
APTT: 94 s — AB (ref 24–37)
aPTT: 88 seconds — ABNORMAL HIGH (ref 24–37)

## 2015-07-10 LAB — BASIC METABOLIC PANEL
ANION GAP: 11 (ref 5–15)
BUN: 17 mg/dL (ref 6–20)
CO2: 21 mmol/L — AB (ref 22–32)
Calcium: 7.5 mg/dL — ABNORMAL LOW (ref 8.9–10.3)
Chloride: 106 mmol/L (ref 101–111)
Creatinine, Ser: 1.55 mg/dL — ABNORMAL HIGH (ref 0.61–1.24)
GFR, EST AFRICAN AMERICAN: 54 mL/min — AB (ref >60)
GFR, EST NON AFRICAN AMERICAN: 46 mL/min — AB (ref >60)
GLUCOSE: 123 mg/dL — AB (ref 65–99)
POTASSIUM: 4.6 mmol/L (ref 3.5–5.1)
Sodium: 138 mmol/L (ref 135–145)

## 2015-07-10 LAB — TROPONIN I: Troponin I: 0.06 ng/mL — ABNORMAL HIGH (ref <0.031)

## 2015-07-10 LAB — CBC
HCT: 34.1 % — ABNORMAL LOW (ref 39.0–52.0)
Hemoglobin: 11.4 g/dL — ABNORMAL LOW (ref 13.0–17.0)
MCH: 32.8 pg (ref 26.0–34.0)
MCHC: 33.4 g/dL (ref 30.0–36.0)
MCV: 98 fL (ref 78.0–100.0)
Platelets: 23 10*3/uL — CL (ref 150–400)
RBC: 3.48 MIL/uL — ABNORMAL LOW (ref 4.22–5.81)
RDW: 17.7 % — ABNORMAL HIGH (ref 11.5–15.5)
WBC: 33.5 10*3/uL — ABNORMAL HIGH (ref 4.0–10.5)

## 2015-07-10 LAB — GLUCOSE, CAPILLARY
GLUCOSE-CAPILLARY: 111 mg/dL — AB (ref 65–99)
GLUCOSE-CAPILLARY: 125 mg/dL — AB (ref 65–99)
Glucose-Capillary: 117 mg/dL — ABNORMAL HIGH (ref 65–99)
Glucose-Capillary: 135 mg/dL — ABNORMAL HIGH (ref 65–99)

## 2015-07-10 LAB — PHOSPHORUS: PHOSPHORUS: 5 mg/dL — AB (ref 2.5–4.6)

## 2015-07-10 LAB — LACTIC ACID, PLASMA: LACTIC ACID, VENOUS: 3.6 mmol/L — AB (ref 0.5–2.0)

## 2015-07-10 LAB — MAGNESIUM: Magnesium: 1.8 mg/dL (ref 1.7–2.4)

## 2015-07-10 MED ORDER — FAT EMULSION 20 % IV EMUL
240.0000 mL | INTRAVENOUS | Status: AC
  Administered 2015-07-10: 240 mL via INTRAVENOUS
  Filled 2015-07-10: qty 250

## 2015-07-10 MED ORDER — SODIUM CHLORIDE 0.9 % IV SOLN
0.0600 mg/kg/h | INTRAVENOUS | Status: DC
  Filled 2015-07-10: qty 250

## 2015-07-10 MED ORDER — TRACE MINERALS CR-CU-MN-SE-ZN 10-1000-500-60 MCG/ML IV SOLN
INTRAVENOUS | Status: AC
  Administered 2015-07-10: 17:00:00 via INTRAVENOUS
  Filled 2015-07-10: qty 1920

## 2015-07-10 MED ORDER — IOHEXOL 300 MG/ML  SOLN
80.0000 mL | Freq: Once | INTRAMUSCULAR | Status: AC | PRN
  Administered 2015-07-10: 80 mL via INTRAVENOUS

## 2015-07-10 MED ORDER — FUROSEMIDE 10 MG/ML IJ SOLN
40.0000 mg | Freq: Once | INTRAMUSCULAR | Status: AC
  Administered 2015-07-10: 40 mg via INTRAVENOUS
  Filled 2015-07-10: qty 4

## 2015-07-10 NOTE — Progress Notes (Addendum)
Subjective:  C/o edema   Antibiotics:  Anti-infectives    Start     Dose/Rate Route Frequency Ordered Stop   07/10/15 0400  vancomycin (VANCOCIN) IVPB 750 mg/150 ml premix  Status:  Discontinued     750 mg 150 mL/hr over 60 Minutes Intravenous Every 12 hours 07/09/15 1545 07/10/15 0904   07/09/15 1515  vancomycin (VANCOCIN) 2,000 mg in sodium chloride 0.9 % 500 mL IVPB     2,000 mg 250 mL/hr over 120 Minutes Intravenous  Once 07/09/15 1500 07/09/15 1846   07/09/15 1515  meropenem (MERREM) 1 g in sodium chloride 0.9 % 100 mL IVPB     1 g 200 mL/hr over 30 Minutes Intravenous 3 times per day 07/09/15 1500     07/10/15 2000  tigecycline (TYGACIL) 50 mg in sodium chloride 0.9 % 100 mL IVPB  Status:  Discontinued     50 mg 200 mL/hr over 30 Minutes Intravenous Every 12 hours 07-10-2015 1511 07/08/15 1217   07/10/2015 1600  dextrose 5 % 300 mL with azithromycin (ZITHROMAX) 600 mg infusion  Status:  Discontinued     150 mL/hr  Intravenous Continuous 2015-07-10 1521 07-10-15 1525   07-10-15 1600  dextrose 5 % 300 mL with azithromycin (ZITHROMAX) 600 mg infusion  Status:  Discontinued     150 mL/hr  Intravenous Every 24 hours 07-10-2015 1525 07/09/15 1500   10-Jul-2015 1530  azithromycin (ZITHROMAX) 500 mg in dextrose 5 % 250 mL IVPB  Status:  Discontinued     500 mg 250 mL/hr over 60 Minutes Intravenous Every 24 hours 2015-07-10 1517 07/10/15 1521      Medications: Scheduled Meds: . antiseptic oral rinse  7 mL Mouth Rinse q12n4p  . chlorhexidine  15 mL Mouth Rinse BID  . hydrocortisone sod succinate (SOLU-CORTEF) inj  20 mg Intravenous QPM  . [START ON 07/11/2015] hydrocortisone sod succinate (SOLU-CORTEF) inj  40 mg Intravenous QAC breakfast  . insulin aspart  0-9 Units Subcutaneous 4 times per day  . meropenem (MERREM) IV  1 g Intravenous 3 times per day  . pantoprazole (PROTONIX) IV  40 mg Intravenous Q24H  . pneumococcal 23 valent vaccine  0.5 mL Intramuscular Tomorrow-1000  .  sodium chloride  1,000 mL Intravenous Once  . sodium chloride flush  3 mL Intravenous Q12H   Continuous Infusions: . Marland KitchenTPN (CLINIMIX-E) Adult 40 mL/hr at 07/10/15 0700   And  . fat emulsion 10 kcal (07/10/15 0700)  . Marland KitchenTPN (CLINIMIX-E) Adult     And  . fat emulsion     PRN Meds:.HYDROmorphone (DILAUDID) injection, LORazepam, promethazine, sodium chloride flush    Objective: Weight change: -5 lb 11.7 oz (-2.6 kg)  Intake/Output Summary (Last 24 hours) at 07/10/15 1200 Last data filed at 07/10/15 0800  Gross per 24 hour  Intake 2360.23 ml  Output   1270 ml  Net 1090.23 ml   Blood pressure 137/112, pulse 89, temperature 97.7 F (36.5 C), temperature source Axillary, resp. rate 23, height 5\' 10"  (1.778 m), weight 166 lb 0.1 oz (75.3 kg), SpO2 100 %. Temp:  [97.7 F (36.5 C)-98.7 F (37.1 C)] 97.7 F (36.5 C) (02/22 0412) Pulse Rate:  [86-154] 89 (02/22 0900) Resp:  [16-29] 23 (02/22 0900) BP: (86-137)/(55-112) 137/112 mmHg (02/22 0900) SpO2:  [75 %-100 %] 100 % (02/22 0900) FiO2 (%):  [80 %-100 %] 80 % (02/22 0001) Weight:  [166 lb 0.1 oz (75.3 kg)] 166 lb 0.1 oz (75.3 kg) (  02/22 0349)  Physical Exam: General: alert on BIPAP  HEENT: anicteric sclera,  EOMI, deaf CVS tachy rate, normal r,  no murmur rubs or gallops Chest: diminished breath sounds at bases no wheezing, rales or rhonchi Abdomen: soft  TTP in the LUQ in particlar, no rebound Extremities:his surgical site is clean Skin: echymoses, edema diffusely, PICC and central line in place Neuro: nonfocal  CBC: CBC Latest Ref Rng 07/10/2015 07/09/2015 07/09/2015  WBC 4.0 - 10.5 K/uL 33.5(H) 3.8(L) 19.9(H)  Hemoglobin 13.0 - 17.0 g/dL 11.4(L) 11.9(L) 11.5(L)  Hematocrit 39.0 - 52.0 % 34.1(L) 36.0(L) 32.4(L)  Platelets 150 - 400 K/uL 23(LL) 43(L) 57(L)      BMET  Recent Labs  07/09/15 1233 07/10/15 0016  NA 135 138  K 3.9 4.6  CL 108 106  CO2 19* 21*  GLUCOSE 304* 123*  BUN 19 17  CREATININE 1.41* 1.55*    CALCIUM 7.3* 7.5*     Liver Panel   Recent Labs  07/09/15 1233 07/10/15 1100  PROT 3.6* 3.5*  ALBUMIN 1.5* 1.4*  AST 68* 79*  ALT 60 54  ALKPHOS 300* 218*  BILITOT 1.8* 2.2*  BILIDIR  --  1.2*  IBILI  --  1.0*       Sedimentation Rate No results for input(s): ESRSEDRATE in the last 72 hours. C-Reactive Protein No results for input(s): CRP in the last 72 hours.  Micro Results: Recent Results (from the past 720 hour(s))  Culture, blood (Routine X 2) w Reflex to ID Panel     Status: None (Preliminary result)   Collection Time: 07/09/15  1:30 PM  Result Value Ref Range Status   Specimen Description BLOOD RIGHT HAND  Final   Special Requests IN PEDIATRIC BOTTLE 1CC  Final   Culture  Setup Time   Final    GRAM NEGATIVE RODS AEROBIC BOTTLE ONLY CRITICAL RESULT CALLED TO, READ BACK BY AND VERIFIED WITH: F FLYNT,RN @0713  07/10/15 MKELLY    Culture PENDING  Incomplete   Report Status PENDING  Incomplete  Culture, blood (Routine X 2) w Reflex to ID Panel     Status: None (Preliminary result)   Collection Time: 07/09/15  1:40 PM  Result Value Ref Range Status   Specimen Description BLOOD RIGHT ARM  Final   Special Requests IN PEDIATRIC BOTTLE 2CC  Final   Culture  Setup Time   Final    GRAM NEGATIVE RODS AEROBIC BOTTLE ONLY CRITICAL RESULT CALLED TO, READ BACK BY AND VERIFIED WITH: F FLYNT,RN @0713  07/10/15 MKELLY    Culture PENDING  Incomplete   Report Status PENDING  Incomplete  MRSA PCR Screening     Status: None   Collection Time: 07/09/15  2:02 PM  Result Value Ref Range Status   MRSA by PCR NEGATIVE NEGATIVE Final    Comment:        The GeneXpert MRSA Assay (FDA approved for NASAL specimens only), is one component of a comprehensive MRSA colonization surveillance program. It is not intended to diagnose MRSA infection nor to guide or monitor treatment for MRSA infections.     Studies/Results: Dg Chest Port 1 View  07/09/2015  CLINICAL DATA:   Central line placement in patient with possible ARDS. EXAM: PORTABLE CHEST 1 VIEW COMPARISON:  Earlier the same day FINDINGS: 1958 hours. Left IJ central venous catheter is new in the interval with catheter tip overlying the SVC/RA junction, possibly just into the upper right atrium. No evidence for left pneumothorax. Right PICC line remains  in place with tip also near the SVC/ RA junction, possibly just into the upper right atrium. The bilateral diffuse airspace disease has decreased in the interval, especially in the upper lungs. Retrocardiac collapse/ consolidation with potential left pleural effusion as before. Telemetry leads overlie the chest. IMPRESSION: New left IJ central line tip overlies the upper right atrium without evidence for pneumothorax. Slight interval decrease in bilateral airspace disease. Electronically Signed   By: Kennith Center M.D.   On: 07/09/2015 20:09   Dg Chest Port 1 View  07/09/2015  CLINICAL DATA:  Dyspnea EXAM: PORTABLE CHEST 1 VIEW COMPARISON:  07/04/2014 FINDINGS: Right upper extremity PICC, tip at the upper right atrium in the setting of lower lung volumes. Symmetric bilateral airspace disease with peripheral sparing. The left diaphragm is obscured, likely by pleural fluid. Low lung volumes. No historical indication of hemoptysis. Borderline cardiomegaly. IMPRESSION: Extensive airspace disease with symmetry favoring edema over bilateral pneumonia. Electronically Signed   By: Marnee Spring M.D.   On: 07/09/2015 11:37      Assessment/Plan:  INTERVAL HISTORY:   07/09/15: patient with worsening abdominal pain, difficulty breathing transfer to ICU for NIPPV 07/10/15: Blood cultures with 2/2 GNR  Principal Problem:   Acute pancreatitis Active Problems:   Crohn's disease (HCC)   Atypical mycobacterial infection of hand   AKI (acute kidney injury) (HCC)   PICC (peripherally inserted central catheter) in place   Chronic adrenal insufficiency (HCC)   Epigastric  abdominal pain   Non-intractable cyclical vomiting with nausea   Anorexia   Abnormal loss of weight   Crohn's disease involving terminal ileum (HCC)   Crohn disease (HCC)   Elevated LFTs   Osteomyelitis of right wrist (HCC)   Osteomyelitis of left wrist (HCC)   Pyogenic arthritis of left wrist (HCC)   Malnutrition of moderate degree   Acute hypoxemic respiratory failure (HCC)   Sinus tachycardia (HCC)   Sepsis (HCC)   Acute pulmonary edema (HCC)   Lactic acidosis   History of small bowel obstruction   ARDS (adult respiratory distress syndrome) (HCC)    Lee Klein is a 65 y.o. 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 He is admitted for possible pancreatitis with an elevated lipase in the setting of nausea and vomiting. We had stopped his tigecycline yesterday in hopes that this was potentially the culprit antibiotic for causing his nausea vomiting and abdominal pain. Unfortunately his abdominal pain has not improved in the interim but in fact has worsened he now is developed difficulty breathing and has bilateral infiltrates on chest x-ray along with sepsis now found to have GNR bacteremia  #1 Gram negative sepsis Potential sources could be severe pancreatitis with necrosis and pancreatic abscess. He has had  A PICC line and had prior Serratia bacteremia that I believe may have been due to the PICC line that is still in place (see my last clinic note), vs urinary source though no urinary symptoms  --continue merrem and dc vancomycin --repeat blood cultures --would get rid of Right PICC line when able ---agree with image abdomen with CT  #2 Nausea and vomiting with poor oral intake and potential pancreatitis with elevated lipase: We'd stop the tigecycline hoping this would ameliorate his symptoms  but 1 day and to stopping this we have not seen improvement. Now that  he is on the BiPAP machine he cannot take oral medications and therefore we will discontinue his entire regimen and stop his azithromycin and his clofazimine. Hopefully this will make a difference to his symptoms  I would image his abdomen with CT   #3 Bilateral infiltrates with hypoxia: agree this could be pulmonary edema from cardiogenic source, it could be aRDS and pancreatitis. It could be multi focal pneumonia.though I think more likely it is edema vs ARDS  #4 thrombocytopenia: Concern for hit exists and primary team is managing he could have superimposed infection on top of this.  #5 Mycobacterium abscesses osteomyelitis and septic arthritis.: We are going to take a "holiday" from his anti-Mycobacterium antibiotics for now. Hopefully cure recovers from his acute illness and we can then revisit whether to rechallenge him again with these antibiotics or observe him off of them.  I spent greater than 35 miinutes with the patient including greater than 50% of time in face to face counsel of the patient and his wife regarding his Gram negative bacteremia, sepsis,  his Mycobacterium abscesses osteomyelitis, his nausea vomiting and possible pancreatitis his worsening abdominal pain worsening hypoxemia with bilateral pulmonary infiltrates thrombocytopenia and in coordination of his care withCCM.  Greatly appreciate the diligent and careful attention he has received from   Critical Care, GI and Primary team.    LOS: 5 days   Acey Lav 07/10/2015, 12:00 PM

## 2015-07-10 NOTE — Progress Notes (Signed)
PARENTERAL NUTRITION CONSULT NOTE - FOLLOW UP  Pharmacy Consult:  TPN Indication:  Severe pancreatitis/expected prolonged NPO status  Allergies  Allergen Reactions  . Amikacin Other (See Comments)    CAUSED DEAFNESS  . Cefoxitin Nausea Only    ABD. PAIN  . Fish Allergy Nausea And Vomiting  . Iodine Nausea Only    JOINT PAIN  . Ivp Dye [Iodinated Diagnostic Agents] Nausea Only    JOINT PAIN  . Rifampin Nausea And Vomiting and Other (See Comments)    CHILLS  . Shellfish Allergy Swelling  . Vancomycin Other (See Comments)    Red Man Syndrome - was told that it was given to fast.  . Demerol Rash    Patient Measurements: Height: 5\' 10"  (177.8 cm) Weight: 166 lb 0.1 oz (75.3 kg) IBW/kg (Calculated) : 73  Weight from Dec 2016 = 69 kg  Vital Signs: Temp: 97.7 F (36.5 C) (02/22 0412) Temp Source: Axillary (02/22 0412) BP: 114/86 mmHg (02/22 0700) Pulse Rate: 88 (02/22 0700) Intake/Output from previous day: 02/21 0701 - 02/22 0700 In: 2151.6 [I.V.:390.6; IV Piggyback:1286; TPN:475] Out: 1270 [Urine:1270]  Labs:  Recent Labs  07/09/15 0440 07/09/15 1233 07/09/15 1234 07/09/15 1842 07/10/15 0016  WBC 19.9* 3.8*  --   --  33.5*  HGB 11.5* 11.9*  --   --  11.4*  HCT 32.4* 36.0*  --   --  34.1*  PLT 57* 43*  --   --  23*  APTT  --   --  43* 95* 94*  INR  --   --  2.10*  --   --      Recent Labs  07/07/15 0804 07/08/15 0541 07/09/15 0440 07/09/15 1233 07/10/15 0016  NA 136 142 137 135 138  K 4.4 4.2 4.2 3.9 4.6  CL 111 114* 110 108 106  CO2 20* 20* 21* 19* 21*  GLUCOSE 56* 121* 107* 304* 123*  BUN 16 18 16 19 17   CREATININE 1.22 1.28* 0.98 1.41* 1.55*  CALCIUM 7.1* 7.5* 7.4* 7.3* 7.5*  MG  --  1.7 2.0  --  1.8  PHOS  --  2.3* 2.5  --  5.0*  PROT 3.3* 3.5*  --  3.6*  --   ALBUMIN 1.4* 1.5*  --  1.5*  --   AST 56* 75*  --  68*  --   ALT 59 59  --  60  --   ALKPHOS 291* 267*  --  300*  --   BILITOT 1.6* 1.3*  --  1.8*  --   PREALBUMIN  --  4.3*  --   --    --   TRIG  --  66  --   --   --    Estimated Creatinine Clearance: 51 mL/min (by C-G formula based on Cr of 1.55).    Recent Labs  07/09/15 1822 07/09/15 2345 07/10/15 0605  GLUCAP 91 125* 117*     Insulin Requirements in the past 24 hours:  2 units SSI (TPN was interrupted 2/21)  Assessment: 11 YOM with a history of Crohn's disease s/p ileostomy presented on 06/29/2015 with intermittent epigastric abdominal pain, cramping, and vomiting for 2 months PTA.  Consulting ID to evaluate antibiotic therapy that could contribute to patient's symptoms.  Per documentation, patient is a poor candidate for surgical J-tube placement and while awaiting for antibiotic changes and resolution of GI symptoms for adequate PO intake, pharmacy consulted to manage TPN for nutritional support.  Patient and wife (  an Charity fundraiser) reported that patient was eating 100% of meals PTA and was gaining and losing weight off and on (30 lbs weight loss over 2 years).  GI: hx Crohn's / GERD / pancreatitis - baseline prealbumin low at 4.3.  Ileostomy O/P not charted.  Considering Marinol to stimulate appetite.  PPI IV Endo: chronic AI, no hx DM - hypoglycemia prior to TPN initiation and while on steroid.  CBGs controlled post TPN initiation. Lytes: Phos elevated at 5 and expect it to come down (Ca x Phos = 47.5, goal < 55), others WNL Renal: SCr increased to 1.55, CrCL 51 ml/min - good UOP 0.7 ml/kr/hr Pulm: transferred to ICU 2/21 d/t resp distress - on BiPAP Cards: no hx - BP soft, tachy, troponin 0.06 AC/Heme: Xarelto PTA for hx LLE DVT dx 02/2015, completed treatment course >> Angiomax started 2/21 for r/o HIT.  Mild anemia, plts decreased to 23K, INR 2.1 (iron level low at 8 - f/u supplementation when possible) Hepatobil: ALT normalized, others elevated.  Tbili improved to 1.8.  Lipase / TG WNL. Neuro: anxiety / hearing loss - possible cochlear implant ID: Vanc/Merrem D#2 for GNR bacteremia, azith/clofazimine/tigecycline from  PTA for hx M.abscessus osteo held - afebrile, WBC increased to 33.5 (steroid dose increased for CT since allergic to contrast), LA 6 - f/u narrowing  Azith PTA >> 2/21 Clofazimine PTA >> 2/21 Tygacil PTA >> 2/20 (?caused pancreatitis) Vanc 2/21 >> Merrem 2/21 >>  Best Practices: Angiomax TPN Access: PICC placed 10/19/14 (for long-term abx administration) TPN start date: 2/20 >>  Current Nutrition:  Clear liquid diet TPN  Nutritional Goals:  2100-2300 kCal and 115-130 grams of protein per day   Plan:  - Increase Clinimix E 5/15 to 80 ml/hr (goal rate 100 ml/hr) + ILE 20% at 10 ml/hr.  Cycle once stable at goal TPN rate. - Daily multivitamin and trace elements - Continue sensitive SSI Q6H.  D/C if CBGs remain controlled at goal TPN rate - F/U CT and AM labs   Kaisa Wofford D. Laney Potash, PharmD, BCPS Pager:  [phone] 07/10/2015, 7:42 AM

## 2015-07-10 NOTE — Progress Notes (Signed)
CRITICAL VALUE ALERT  Critical value received: Lactic Acid 3.6   Date of notification: 07/10/15  Time of notification:  1437  Critical value read back: Yes  Nurse who received alert: Hazel Sams RN   MD notified (1st page):  MD Devota Pace   Time of first page:  1452  MD notified (2nd page):  Time of second page:  Responding MD:  MD Devota Pace   Time MD responded:  [id number]

## 2015-07-10 NOTE — Progress Notes (Signed)
PULMONARY / CRITICAL CARE MEDICINE   Name: ABRAAM TURTURRO MRN: [id number] DOB: 1950-08-02    ADMISSION DATE:  07/01/2015 CONSULTATION DATE:  07/09/15  REFERRING MD:  Dr. Oswaldo Done /  Internal Medicine  CHIEF COMPLAINT:  Abdominal pain and vomiting   BRIEF PATIENT DESCRIPTION : The patient was admitted for acute on chronic nausea and vomiting. He has Crohn's disease. Also with significant comorbidities. 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 He was treated as drug-induced pancreatitis. GI and ID have been following. He had lactic acidosis. Had fluid resuscitation. Had resp distress on 2/21. Chest x-ray with bilateral infiltrates, new from admission. He went into respiratory distress. O2 sats 60% on non breather mask. Transferred to ICU.  ABG prior to transfer was 7.26, PCO2 33, PO2 48 on 100% nonrebreather mask. He received 80 mg IV Lasix. Patient is 12 L positive since admission.  SIGNIFICANT EVENTS:  06/29/2015 admitted for nausea vomiting, possible drug-induced pancreatitis 07/09/15 went into resp distress: O2 sats 60% on NRBM; transferred to ICU   STUDIES : [name] Abd (2/17) >> Heterogeneous echogenicity of pancreas which could due pancreatic inflammation; small ascites; fatty liver 2D echo (05/2014) >> EF 50%, CHFpEF  IMAGING : CXR (2/17) >> no active dse CXR (2/21) >> acute B infiltrates  CULTURES: Blood culture, MRSA screen, urine culture (2/21) >>   ANTIBIOTICS :  Clofazimine 2/17 >> 2/21 Azithromycin 2/17 >> 2/21 Tigecycline 2/17 -2/20 Vanc 2/21>> Meropenem 2/21>>  LINES:  R PICC --since 03/2015 at least (wife does not remember) [facility] 2/21>> Right ileostomy   SUBJECTIVE:  Patient is deaf but able to respond via whiteboard.  No complaints Has been on and off NRB and switching with BiPAP  VITAL SIGNS: BP 114/86 mmHg  Pulse 88  Temp(Src) 97.7 F (36.5 C) (Axillary)  Resp 19  Ht 5\' 10"  (1.778 m)  Wt 166 lb 0.1 oz (75.3 kg)  BMI 23.82  kg/m2  SpO2 100%  HEMODYNAMICS:    VENTILATOR SETTINGS: Vent Mode:  [-]  FiO2 (%):  [80 %-100 %] 80 %on Bipap 12/6  80% FiO2.   INTAKE / OUTPUT: I/O last 3 completed shifts: In: 2904.8 [P.O.:120; I.V.:400.6; IV Piggyback:1286] Out: 1270 [Urine:1270]   PHYSICAL EXAMINATION:  General:  Wearing BiPAP. NAD, sleepy Neuro:  Non-focal. Moves extremities equally. Deaf. Follows commands HEENT:  BiPAP mask on face. NCAT Cardiovascular:  RRR. Good S1 and S2. No murmur rub or gallop.   Lungs: Normal WOB. Comfortable on the BiPAP. Crackles. No wheezing. No rhonchi.  Abdomen:  Soft. Decreased bowel sounds. No masses or tenderness. (+) Ostomy in R abd.  Musculoskeletal: 2 bilateral edema. Patient with chronic left hand osteomyelitis. Skin:  Warm and dry. No rash.  LABS:  BMET  Recent Labs Lab 07/09/15 0440 07/09/15 1233 07/10/15 0016  NA 137 135 138  K 4.2 3.9 4.6  CL 110 108 106  CO2 21* 19* 21*  BUN 16 19 17   CREATININE 0.98 1.41* 1.55*  GLUCOSE 107* 304* 123*    Electrolytes  Recent Labs Lab 07/08/15 0541 07/09/15 0440 07/09/15 1233 07/10/15 0016  CALCIUM 7.5* 7.4* 7.3* 7.5*  MG 1.7 2.0  --  1.8  PHOS 2.3* 2.5  --  5.0*    CBC  Recent Labs Lab 07/09/15 0440 07/09/15 1233 07/10/15 0016  WBC 19.9* 3.8* 33.5*  HGB 11.5* 11.9* 11.4*  HCT 32.4* 36.0* 34.1*  PLT 57* 43* 23*    Coag's  Recent Labs Lab 07/09/15  1234 07/09/15 1842 07/10/15 0016  APTT 43* 95* 94*  INR 2.10*  --   --     Sepsis Markers  Recent Labs Lab 14-Jul-2015 1638 07/06/15 0010 07/09/15 1235  LATICACIDVEN 3.58* 1.7 6.0*    ABG  Recent Labs Lab 07/09/15 1145 07/09/15 1535  PHART 7.266* 7.316*  PCO2ART 33.4* 35.0  PO2ART 47.9* 73.0*    Liver Enzymes  Recent Labs Lab 07/07/15 0804 07/08/15 0541 07/09/15 1233  AST 56* 75* 68*  ALT 59 59 60  ALKPHOS 291* 267* 300*  BILITOT 1.6* 1.3* 1.8*  ALBUMIN 1.4* 1.5* 1.5*    Cardiac Enzymes  Recent Labs Lab  07/09/15 1233 07/09/15 1842 07/10/15 0016  TROPONINI 0.03 0.06* 0.06*    Glucose  Recent Labs Lab 07/09/15 0552 07/09/15 0933 07/09/15 1219 07/09/15 1822 07/09/15 2345 07/10/15 0605  GLUCAP 106* 100* 84 91 125* 117*    Imaging Dg Chest Port 1 View  07/09/2015  CLINICAL DATA:  Central line placement in patient with possible ARDS. EXAM: PORTABLE CHEST 1 VIEW COMPARISON:  Earlier the same day FINDINGS: 1958 hours. Left IJ central venous catheter is new in the interval with catheter tip overlying the SVC/RA junction, possibly just into the upper right atrium. No evidence for left pneumothorax. Right PICC line remains in place with tip also near the SVC/ RA junction, possibly just into the upper right atrium. The bilateral diffuse airspace disease has decreased in the interval, especially in the upper lungs. Retrocardiac collapse/ consolidation with potential left pleural effusion as before. Telemetry leads overlie the chest. IMPRESSION: New left IJ central line tip overlies the upper right atrium without evidence for pneumothorax. Slight interval decrease in bilateral airspace disease. Electronically Signed   By: Kennith Center M.D.   On: 07/09/2015 20:09   Dg Chest Port 1 View  07/09/2015  CLINICAL DATA:  Dyspnea EXAM: PORTABLE CHEST 1 VIEW COMPARISON:  07/04/2014 FINDINGS: Right upper extremity PICC, tip at the upper right atrium in the setting of lower lung volumes. Symmetric bilateral airspace disease with peripheral sparing. The left diaphragm is obscured, likely by pleural fluid. Low lung volumes. No historical indication of hemoptysis. Borderline cardiomegaly. IMPRESSION: Extensive airspace disease with symmetry favoring edema over bilateral pneumonia. Electronically Signed   By: Marnee Spring M.D.   On: 07/09/2015 11:37    DISCUSSION: The patient was admitted with acute on chronic nausea and vomiting. He has Crohn's disease. Also with significant comorbidities. 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 Since admission, he has been  treated as most likely drug-induced pancreatitis (Tigecycline).  On 2/21, pt had worsening resp status. Chest x-ray with bilateral infiltrates, new from admission. He went into respiratory distress. O2 sats 60% on non breather mask. Transferred to ICU.  ABG prior to transfer was 7.26, PCO2 33, PO2 48 on 100% nonrebreather mask. He received 80 mg IV Lasix. Patient is  12 L positive since admission.  ASSESSMENT / PLAN:  PULMONARY A: Acute hypoxemic respiratory failure secondary to the following: Pulmonary edema, volume overload  P:   Currently on BiPAP; escalate down to NRB when possible monitor for decompensation s/p Lasix Received 1 amp of bicarbonate yesterday Consider repeat ABG when off BiPAP  CARDIOVASCULAR A:  Sinus tachycardia from underlying pulmonary edema - Resolved Patient with diastolic dysfunction based on 1610 echo. Not known to have CAD. P:  Monitor on telemtry Serial troponins stable  RENAL A:  Acute kidney injury. Metabolic  acidosis. Volume overload. P:   Hold Lasix in setting of worsening Cr; will also be getting contrast  Replace electrolytes as indicated  GASTROINTESTINAL A:  Crohn's disease requiring steroids. Drug-induced pancreatitis (Tigecycline)   Chronic nausea, vomiting. History of antibiotic related diarrhea. Severe malnutrition P:   Continue TPN GI following. Continue steroids, IV.  HEMATOLOGIC A:  Thrombocytopenia, acute and chronic. Baseline was in the 140s. Possible drug induced. Possible HIT.  H/O left lower extremity DVT in October 2016. By history, unprovoked. He was on Xarelto outpatient. P:  Unlikely to use agratroban due to liver dse. Pharmacy consulted and placed on bivalirudin Follow-up HIT panel.  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 Abx for 1 yr. Consider healthcare associated pneumonia. Consider sepsis.  Lactic acidosis P:   Abx as above; only on meropenom ID following - drug holiday for M. abscessus GNR in 2/2 blood cultures Follow-up other cultures Repeat lactic acid Take out PICC  ENDOCRINE A:  Chronic adrenal insufficiency.  P:   Continue IV steroids Monitor sugars SSI  NEUROLOGIC A:  Anxiety, chronic  P:   RASS goal: 0 Judicious use of benzos.  May need Precedex for anxiety.   FAMILY  - Updates: Updated wife. - I spent at least 80 minutes of critical care time with this pt today  Caryl Ada, DO 07/10/2015, 7:31 AM PGY-2, [facility]

## 2015-07-10 NOTE — Progress Notes (Signed)
Progress Note   Subjective  Patient now in ICU on BiPAP for respiratory failure, pneumonia vs. Volume overload. He has had marked increased in WBC and drop in platelets. No change in ostomy output per wife.    Objective   Vital signs in last 24 hours: Temp:  [97.7 F (36.5 C)-98.7 F (37.1 C)] 97.7 F (36.5 C) (02/22 0412) Pulse Rate:  [86-166] 98 (02/22 0839) Resp:  [16-32] 18 (02/22 0839) BP: (86-132)/(55-110) 105/84 mmHg (02/22 0839) SpO2:  [54 %-100 %] 100 % (02/22 0839) FiO2 (%):  [80 %-100 %] 80 % (02/22 0001) Weight:  [166 lb 0.1 oz (75.3 kg)] 166 lb 0.1 oz (75.3 kg) (02/22 0349) Last BM Date: 07/09/15 General:    white male with BiPAP Heart:  Regular rate and rhythm; no murmurs Lungs: Respirations even, relatively clear with some coarse BS B Abdomen:  Soft, did not endorse tenderness with palpation. Normal bowel sounds. Extremities:  (+) 1 edema B. Neurologic:  Hearing loss,  grossly normal neurologically. Psych:  Cooperative. Normal affect.  Intake/Output from previous day: 02/21 0701 - 02/22 0700 In: 2250.9 [I.V.:399.9; IV Piggyback:1286; TPN:565] Out: 1270 [Urine:1270] Intake/Output this shift: Total I/O In: 109.3 [I.V.:9.3; TPN:100] Out: -   Lab Results:  Recent Labs  07/09/15 0440 07/09/15 1233 07/10/15 0016  WBC 19.9* 3.8* 33.5*  HGB 11.5* 11.9* 11.4*  HCT 32.4* 36.0* 34.1*  PLT 57* 43* 23*   BMET  Recent Labs  07/09/15 0440 07/09/15 1233 07/10/15 0016  NA 137 135 138  K 4.2 3.9 4.6  CL 110 108 106  CO2 21* 19* 21*  GLUCOSE 107* 304* 123*  BUN 16 19 17   CREATININE 0.98 1.41* 1.55*  CALCIUM 7.4* 7.3* 7.5*   LFT  Recent Labs  07/09/15 1233  PROT 3.6*  ALBUMIN 1.5*  AST 68*  ALT 60  ALKPHOS 300*  BILITOT 1.8*   PT/INR  Recent Labs  07/09/15 1234  LABPROT 23.4*  INR 2.10*    Studies/Results: Dg Chest Port 1 View  07/09/2015  CLINICAL DATA:  Central line placement in patient with possible ARDS. EXAM: PORTABLE  CHEST 1 VIEW COMPARISON:  Earlier the same day FINDINGS: 1958 hours. Left IJ central venous catheter is new in the interval with catheter tip overlying the SVC/RA junction, possibly just into the upper right atrium. No evidence for left pneumothorax. Right PICC line remains in place with tip also near the SVC/ RA junction, possibly just into the upper right atrium. The bilateral diffuse airspace disease has decreased in the interval, especially in the upper lungs. Retrocardiac collapse/ consolidation with potential left pleural effusion as before. Telemetry leads overlie the chest. IMPRESSION: New left IJ central line tip overlies the upper right atrium without evidence for pneumothorax. Slight interval decrease in bilateral airspace disease. Electronically Signed   By: Kennith Center M.D.   On: 07/09/2015 20:09   Dg Chest Port 1 View  07/09/2015  CLINICAL DATA:  Dyspnea EXAM: PORTABLE CHEST 1 VIEW COMPARISON:  07/04/2014 FINDINGS: Right upper extremity PICC, tip at the upper right atrium in the setting of lower lung volumes. Symmetric bilateral airspace disease with peripheral sparing. The left diaphragm is obscured, likely by pleural fluid. Low lung volumes. No historical indication of hemoptysis. Borderline cardiomegaly. IMPRESSION: Extensive airspace disease with symmetry favoring edema over bilateral pneumonia. Electronically Signed   By: Marnee Spring M.D.   On: 07/09/2015 11:37       Assessment / Plan:   65  y/o male with history of Crohns disease, atypical mycobacterial infection of the hand on chronic antibiotics, with elevated in LAEs for several weeks and thought to have pancreatitis on admission, suspect medication induced from Tigecyclin, which has since been stopped. His condition deteriorated yesterday, he is now on BiPAP for respiratory failure, possible pneumonia. His WBC is markedly elevated today, lactate elevated yesterday, with worsening thrombocytopenia.   Recommend CT abdomen /  pelvis at this time given recent history of pancreatitis, elevated LAEs, and history of Crohns - ensure no abscess or complications of pancreatitis / Crohn's, and also evaluate the biliary tree. Please check LFTs again today on next blood draw to ensure stable or no interval worsening and trend lactate. Labs sent to rule out chronic liver disease, and would ensure hepatitis C AB and hepatitis B surface AG are sent as I don't see this drawn yet, although suspect he more than likely has drug induced liver injury from antibiotics. I would trend LFTs daily, and INR recently elevated, would also trend. Will await CT results, please call with questions.   Ileene Patrick, MD [facility] Pager [phone]    LOS: 5 days   Reeves Forth Tru Leopard  07/10/2015, 8:55 AM

## 2015-07-10 NOTE — Progress Notes (Signed)
CRITICAL VALUE ALERT  Critical value received:  Platelet 23  Date of notification:  07/10/15  Time of notification:  0315  Critical value read back: yes  Nurse who received alert:  Julius Bowels, RN  MD notified (1st page):  Dr. Darrick Penna  Time of first page:  0400

## 2015-07-10 NOTE — Progress Notes (Signed)
ANTICOAGULATION CONSULT NOTE - Follow Up Consult  Pharmacy Consult for Bivalirudin Indication: DVT  Allergies  Allergen Reactions  . Amikacin Other (See Comments)    CAUSED DEAFNESS  . Cefoxitin Nausea Only    ABD. PAIN  . Fish Allergy Nausea And Vomiting  . Iodine Nausea Only    JOINT PAIN  . Ivp Dye [Iodinated Diagnostic Agents] Nausea Only    JOINT PAIN  . Rifampin Nausea And Vomiting and Other (See Comments)    CHILLS  . Shellfish Allergy Swelling  . Vancomycin Other (See Comments)    Red Man Syndrome - was told that it was given to fast.  . Demerol Rash    Patient Measurements: Height:  (177.8 cm) Weight: 171 lb 11.8 oz (77.9 kg) IBW/kg (Calculated) : 73  Vital Signs: Temp: 97.9 F (36.6 C) (02/21 2343) Temp Source: Axillary (02/21 2343) BP: 108/55 mmHg (02/22 0001) Pulse Rate: 98 (02/22 0001)  Labs:  Recent Labs  07/08/15 0541 07/09/15 0440 07/09/15 1233 07/09/15 1234 07/09/15 1842 07/10/15 0016  HGB 10.6* 11.5* 11.9*  --   --  11.4*  HCT 30.5* 32.4* 36.0*  --   --  34.1*  PLT 81* 57* 43*  --   --  PENDING  APTT  --   --   --  43* 95* 94*  LABPROT  --   --   --  23.4*  --   --   INR  --   --   --  2.10*  --   --   CREATININE 1.28* 0.98 1.41*  --   --   --   TROPONINI  --   --  0.03  --  0.06*  --     Estimated Creatinine Clearance: 56.1 mL/min (by C-G formula based on Cr of 1.41).  Assessment:  DVT in October 2016, on Xarelto PTA, last dose 2/16, was on hep gtt until 2/17, then lovenox, plt trending down so started Bivalirudin for r/o HIT and to treat DVT. aPTT remains elevated (94 sec). No bleeding noted.   Goal of Therapy:  aPTT 50-85 sec Monitor platelets by anticoagulation protocol: Yes   Plan:  - Decrease bivalirudin infusion rate by 20% to 0.06 mg/kg/hr (dosing specific wt 77.9 kg) - Recheck aPTT in 4 hrs  Christoper Fabian, PharmD, BCPS Clinical pharmacist, pager [phone] 07/10/2015,3:08 AM

## 2015-07-11 ENCOUNTER — Inpatient Hospital Stay (HOSPITAL_COMMUNITY): Payer: Managed Care, Other (non HMO)

## 2015-07-11 DIAGNOSIS — T80219D Unspecified infection due to central venous catheter, subsequent encounter: Secondary | ICD-10-CM

## 2015-07-11 DIAGNOSIS — E440 Moderate protein-calorie malnutrition: Secondary | ICD-10-CM

## 2015-07-11 DIAGNOSIS — K8501 Idiopathic acute pancreatitis with uninfected necrosis: Secondary | ICD-10-CM

## 2015-07-11 LAB — ANTI-SMOOTH MUSCLE ANTIBODY, IGG: F-Actin IgG: 3 Units (ref 0–19)

## 2015-07-11 LAB — COMPREHENSIVE METABOLIC PANEL
ALBUMIN: 1.3 g/dL — AB (ref 3.5–5.0)
ALT: 48 U/L (ref 17–63)
ANION GAP: 6 (ref 5–15)
AST: 62 U/L — ABNORMAL HIGH (ref 15–41)
Alkaline Phosphatase: 200 U/L — ABNORMAL HIGH (ref 38–126)
BUN: 26 mg/dL — ABNORMAL HIGH (ref 6–20)
CO2: 27 mmol/L (ref 22–32)
Calcium: 7.8 mg/dL — ABNORMAL LOW (ref 8.9–10.3)
Chloride: 108 mmol/L (ref 101–111)
Creatinine, Ser: 1.54 mg/dL — ABNORMAL HIGH (ref 0.61–1.24)
GFR calc Af Amer: 54 mL/min — ABNORMAL LOW (ref >60)
GFR calc non Af Amer: 47 mL/min — ABNORMAL LOW (ref >60)
GLUCOSE: 135 mg/dL — AB (ref 65–99)
POTASSIUM: 4.1 mmol/L (ref 3.5–5.1)
SODIUM: 141 mmol/L (ref 135–145)
TOTAL PROTEIN: 3.7 g/dL — AB (ref 6.5–8.1)
Total Bilirubin: 1.6 mg/dL — ABNORMAL HIGH (ref 0.3–1.2)

## 2015-07-11 LAB — HEPATITIS B SURFACE ANTIGEN: HEP B S AG: NEGATIVE

## 2015-07-11 LAB — CBC
HCT: 33.1 % — ABNORMAL LOW (ref 39.0–52.0)
HEMOGLOBIN: 11.1 g/dL — AB (ref 13.0–17.0)
MCH: 32.8 pg (ref 26.0–34.0)
MCHC: 33.5 g/dL (ref 30.0–36.0)
MCV: 97.9 fL (ref 78.0–100.0)
Platelets: 22 10*3/uL — CL (ref 150–400)
RBC: 3.38 MIL/uL — ABNORMAL LOW (ref 4.22–5.81)
RDW: 17.4 % — AB (ref 11.5–15.5)
WBC: 35.1 10*3/uL — ABNORMAL HIGH (ref 4.0–10.5)

## 2015-07-11 LAB — GLUCOSE, CAPILLARY
Glucose-Capillary: 102 mg/dL — ABNORMAL HIGH (ref 65–99)
Glucose-Capillary: 127 mg/dL — ABNORMAL HIGH (ref 65–99)
Glucose-Capillary: 129 mg/dL — ABNORMAL HIGH (ref 65–99)
Glucose-Capillary: 144 mg/dL — ABNORMAL HIGH (ref 65–99)

## 2015-07-11 LAB — EXTRACTABLE NUCLEAR ANTIGEN ANTIBODY
ENA SM Ab Ser-aCnc: <0.2 AI (ref 0.0–0.9)
Ribonucleic Protein: <0.2 AI (ref 0.0–0.9)

## 2015-07-11 LAB — PHOSPHORUS: Phosphorus: 3 mg/dL (ref 2.5–4.6)

## 2015-07-11 LAB — IGG: IgG (Immunoglobin G), Serum: 570 mg/dL — ABNORMAL LOW (ref 700–1600)

## 2015-07-11 LAB — MAGNESIUM: Magnesium: 1.9 mg/dL (ref 1.7–2.4)

## 2015-07-11 LAB — LIPASE, BLOOD: Lipase: 38 U/L (ref 11–51)

## 2015-07-11 LAB — HEPATITIS C ANTIBODY: HCV Ab: <0.1 s/co ratio (ref 0.0–0.9)

## 2015-07-11 LAB — CERULOPLASMIN: Ceruloplasmin: 10.1 mg/dL — ABNORMAL LOW (ref 16.0–31.0)

## 2015-07-11 LAB — HEPARIN INDUCED PLATELET AB (HIT ANTIBODY): Heparin Induced Plt Ab: 0.248 OD (ref 0.000–0.400)

## 2015-07-11 LAB — LACTIC ACID, PLASMA: Lactic Acid, Venous: 2.8 mmol/L (ref 0.5–2.0)

## 2015-07-11 LAB — MITOCHONDRIAL ANTIBODIES: Mitochondrial M2 Ab, IgG: 2.3 Units (ref 0.0–20.0)

## 2015-07-11 LAB — ALPHA-1-ANTITRYPSIN: A-1 Antitrypsin, Ser: 164 mg/dL (ref 90–200)

## 2015-07-11 MED ORDER — FUROSEMIDE 10 MG/ML IJ SOLN
40.0000 mg | Freq: Two times a day (BID) | INTRAMUSCULAR | Status: DC
  Administered 2015-07-11 – 2015-07-15 (×8): 40 mg via INTRAVENOUS
  Filled 2015-07-11 (×9): qty 4

## 2015-07-11 MED ORDER — TRACE MINERALS CR-CU-MN-SE-ZN 10-1000-500-60 MCG/ML IV SOLN
INTRAVENOUS | Status: AC
  Administered 2015-07-11: 17:00:00 via INTRAVENOUS
  Filled 2015-07-11: qty 2400

## 2015-07-11 MED ORDER — FAT EMULSION 20 % IV EMUL
240.0000 mL | INTRAVENOUS | Status: AC
  Administered 2015-07-11: 240 mL via INTRAVENOUS
  Filled 2015-07-11: qty 250

## 2015-07-11 NOTE — Progress Notes (Signed)
PULMONARY / CRITICAL CARE MEDICINE   Name: ANWAR SAKATA MRN: [id number] DOB: 08-10-1950    ADMISSION DATE:  06/20/2015 CONSULTATION DATE:  07/09/15  REFERRING MD:  Dr. Oswaldo Done /  Internal Medicine  CHIEF COMPLAINT:  Abdominal pain and vomiting   BRIEF PATIENT DESCRIPTION : The patient was admitted for acute on chronic nausea and vomiting. He has Crohn's disease. Also with significant comorbidities. 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 He was treated as drug-induced pancreatitis. GI and ID have been following. He had lactic acidosis. Had fluid resuscitation. Had resp distress on 2/21. Chest x-ray with bilateral infiltrates, new from admission. He went into respiratory distress. O2 sats 60% on non breather mask. Transferred to ICU.  ABG prior to transfer was 7.26, PCO2 33, PO2 48 on 100% nonrebreather mask. He received 80 mg IV Lasix. Patient is 12 L positive since admission.  SIGNIFICANT EVENTS:  06/23/2015 admitted for nausea vomiting, possible drug-induced pancreatitis 07/09/15 went into resp distress: O2 sats 60% on NRBM; transferred to ICU   STUDIES : [name] Abd (2/17) >> Heterogeneous echogenicity of pancreas which could due pancreatic inflammation; small ascites; fatty liver 2D echo (05/2014) >> EF 50%, CHFpEF CT Abd/pelvis - negative for pancreatic necrosis or abscess or complication related to Crohn's disease. New moderate bilateral pleural effusions. New moderate volume of abdominal ascites. 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   IMAGING : CXR (2/17) >> no active dse CXR (2/21) >> acute B infiltrates  CULTURES: Blood culture (2/21) >> GNR urine culture (2/21) >>  BCx (2/22) >>  ANTIBIOTICS :  Clofazimine 2/17 >> 2/21 Azithromycin 2/17 >> 2/21 Tigecycline 2/17 -2/20 Vanc 2/21>> 2/22 Meropenem 2/21>>  LINES:  R PICC --since 03/2015 at least (wife does not remember) [facility] 2/21>> Right ileostomy   SUBJECTIVE:  Awake  and responsive. No overnight events. Afebrile. PICC removed yesterday  VITAL SIGNS: BP 119/51 mmHg  Pulse 103  Temp(Src) 98.1 F (36.7 C) (Axillary)  Resp 25  Ht 5\' 10"  (1.778 m)  Wt 166 lb 10.7 oz (75.6 kg)  BMI 23.91 kg/m2  SpO2 93%  HEMODYNAMICS:    VENTILATOR SETTINGS: Vent Mode:  [-]  FiO2 (%):  [50 %] 50 %on Bipap 12/6  80% FiO2.   INTAKE / OUTPUT: I/O last 3 completed shifts: In: 2887.1 [I.V.:245.4; IV Piggyback:550] Out: 3050 [Urine:3050]   PHYSICAL EXAMINATION:  General:  Wearing BiPAP. NAD, more alert Neuro:  Non-focal. Moves extremities equally. Deaf. Follows commands. Awake and alert HEENT:  BiPAP mask on face. NCAT Cardiovascular:  RRR. Good S1 and S2. No murmur rub or gallop.   Lungs: Normal WOB. Comfortable on the BiPAP. CTAB, no wheezing. No rhonchi.  Abdomen:  Soft. Decreased bowel sounds. No masses or tenderness. (+) Ostomy in R abd.  Musculoskeletal: +2 bilateral edema. Patient with chronic left hand osteomyelitis. Skin:  Warm and dry. No rash.  LABS:  BMET  Recent Labs Lab 07/09/15 1233 07/10/15 0016 07/11/15 0627  NA 135 138 141  K 3.9 4.6 4.1  CL 108 106 108  CO2 19* 21* 27  BUN 19 17 26*  CREATININE 1.41* 1.55* 1.54*  GLUCOSE 304* 123* 135*    Electrolytes  Recent Labs Lab 07/09/15 0440 07/09/15 1233 07/10/15 0016 07/11/15 0627  CALCIUM 7.4* 7.3* 7.5* 7.8*  MG 2.0  --  1.8 1.9  PHOS 2.5  --  5.0* 3.0    CBC  Recent Labs Lab 07/09/15 1233 07/10/15 0016 07/11/15 7846  WBC 3.8* 33.5* 35.1*  HGB 11.9* 11.4* 11.1*  HCT 36.0* 34.1* 33.1*  PLT 43* 23* 22*    Coag's  Recent Labs Lab 07/09/15 1234 07/09/15 1842 07/10/15 0016 07/10/15 0814  APTT 43* 95* 94* 88*  INR 2.10*  --   --   --     Sepsis Markers  Recent Labs Lab 07/09/15 1235 07/10/15 1308 07/11/15 0638  LATICACIDVEN 6.0* 3.6* 2.8*    ABG  Recent Labs Lab 07/09/15 1145 07/09/15 1535  PHART 7.266* 7.316*  PCO2ART 33.4* 35.0  PO2ART  47.9* 73.0*    Liver Enzymes  Recent Labs Lab 07/09/15 1233 07/10/15 1100 07/11/15 0627  AST 68* 79* 62*  ALT 60 54 48  ALKPHOS 300* 218* 200*  BILITOT 1.8* 2.2* 1.6*  ALBUMIN 1.5* 1.4* 1.3*    Cardiac Enzymes  Recent Labs Lab 07/09/15 1233 07/09/15 1842 07/10/15 0016  TROPONINI 0.03 0.06* 0.06*    Glucose  Recent Labs Lab 07/09/15 2345 07/10/15 0605 07/10/15 1221 07/10/15 1847 07/10/15 2347 07/11/15 0557  GLUCAP 125* 117* 111* 135* 144* 129*    Imaging Ct Abdomen Pelvis W Contrast  07/10/2015  CLINICAL DATA:  Gram negative sepsis and patient admitted to the hospital 06/23/2015 with nausea and vomiting thought to be due to pancreatitis. Question pancreatic abscess or necrosis or complicating feature related to Crohn's disease. Subsequent encounter. EXAM: CT ABDOMEN AND PELVIS WITH CONTRAST TECHNIQUE: Multidetector CT imaging of the abdomen and pelvis was performed using the standard protocol following bolus administration of intravenous contrast. CONTRAST:  80 mL OMNIPAQUE IOHEXOL 300 MG/ML  SOLN COMPARISON:  CT abdomen and pelvis 05/29/2015, 11/06/2014 and 09/16/2013. FINDINGS: Since the prior CT scan the patient has developed moderate bilateral pleural effusions. No pericardial effusion is identified. Heart size is normal. There is bilateral lower lobe airspace disease. Since the prior CT scan, the patient has developed a small to moderate volume of abdominal and pelvic ascites. Little to no peripancreatic stranding is seen. A low attenuating lesion in the body of the pancreas measuring 1 cm in diameter was present on the most recent CT scan and is unchanged. It is not identified on the earlier CT scans and likely represents a pseudocyst. The pancreas is otherwise unremarkable. The gallbladder has been removed. The liver, spleen and adrenal glands appear normal. Decreased attenuation in the posterior aspect of the right kidney on both the early and delayed phase imaging  appears be due to artifact as the patient was scanned with his arms down. Small hiatal hernia is noted. The stomach is otherwise unremarkable. 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 Peristomal hernia containing loops small bowel appears unchanged. There is no bowel obstruction. No free intraperitoneal air, pneumatosis or portal venous gas is identified. Diffuse body wall edema is noted. 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 The patient has lumbar spondylosis with loss of disc space height appearing worst at L2-3 and L5-S1. IMPRESSION: The exam is negative for pancreatic necrosis or abscess or complication related to Crohn's disease. The biliary tree appears normal. New moderate bilateral pleural effusions with extensive basilar airspace disease which could be due to pneumonia and/or pulmonary edema. New moderate volume of abdominal ascites without focal fluid collection. 1.0 cm hypoattenuating lesion the body of the pancreas was present on the most recent CT scan but new since the earlier studies and likely represents a pseudocyst. Hypoattenuation in the posterior aspect of the right kidney appears to  be due to artifact as the patient was scanned with his arms down. Correlation with urinalysis could be used to exclude urinary tract infection. Avascular necrosis of the femoral heads bilaterally. Electronically Signed   By: Drusilla Kanner M.D.   On: 07/10/2015 12:55   Dg Chest Port 1 View  07/11/2015  CLINICAL DATA:  Shortness of breath. EXAM: PORTABLE CHEST 1 VIEW COMPARISON:  07/09/2015, 05/13/2015, 11/07/2014, 06/11/2014. CT 11/06/2014 . FINDINGS: Interim removal of right PICC line. Left IJ line in stable position. Stable cardiomegaly. Persistent but slightly improving bilateral pulmonary infiltrates/ edema. Low lung volumes with basilar atelectasis. Lucency noted over the right hemidiaphragm is stable and most likely  related to diaphragm contour, prominent pleural fat, and basilar atelectasis. No acute bony abnormality. IMPRESSION: 1. Interim removal of right PICC line. Left IJ line stable position. 2. Persistent but partially clearing bilateral pulmonary edema and/or pneumonia. Persistent low lung volumes with basilar atelectasis. Electronically Signed   By: Maisie Fus  Register   On: 07/11/2015 07:16    DISCUSSION: The patient was admitted with acute on chronic nausea and vomiting. He has Crohn's disease. Also with significant comorbidities. He has chronic left hand osteomyelitis (Mycobacteriun abscessus)  for which he is on chronic antibiotics. Since admission, he has been  treated as most likely drug-induced pancreatitis (Tigecycline).  On 2/21, pt had worsening resp status. Chest x-ray with bilateral infiltrates, new from admission. He went into respiratory distress. O2 sats 60% on non breather mask. Transferred to ICU.  ABG prior to transfer was 7.26, PCO2 33, PO2 48 on 100% nonrebreather mask. He received 80 mg IV Lasix. Patient is  12 L positive since admission.  ASSESSMENT / PLAN:  PULMONARY A: Acute hypoxemic respiratory failure secondary to the following: Pulmonary edema, volume overload  P:   Currently on BiPAP; escalate down to NRB.  Failed wean off Bipap yesterday monitor for decompensation Repeat Lasix Consider repeat ABG when off BiPAP  CARDIOVASCULAR A:  Sinus tachycardia from underlying pulmonary edema - Resolved Patient with diastolic dysfunction based on 1610 echo. Not known to have CAD. P:  Monitor on telemtry Serial troponins stable  RENAL A:  Acute kidney injury. - stable Metabolic acidosis. Volume overload. P:   Lasix  BID Replace electrolytes as indicated KVO  GASTROINTESTINAL A:  Crohn's disease requiring steroids. Drug-induced pancreatitis (Tigecycline)   Chronic nausea, vomiting. History of antibiotic related diarrhea. Severe malnutrition P:   Continue TPN;  can discontinue when on fluids GI following. Continue steroids, daily (decreased) Recheck lipase Clear liquid diet if on NRB CT Abd without abdominal infection  HEMATOLOGIC A:  Thrombocytopenia, acute and chronic. Baseline was in the 140s. Possible drug induced. Possible HIT.  H/O left lower extremity DVT in October 2016. By history, unprovoked. He was on Xarelto outpatient. P:  Unlikely to use agratroban due to liver dse. 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 Abx for 1 yr. Consider healthcare associated pneumonia. Consider sepsis.  Lactic acidosis - downtrending PICC likely source of infection P:   Abx as above; only on meropenom ID following - drug holiday for M. abscessus GNR in 2/2 blood cultures (2/21), repeat on 2/22 Follow-up other cultures No longer with PICC access   ENDOCRINE A:  Chronic adrenal insufficiency.  P:   Continue IV steroids- reduced Monitor sugars SSI  NEUROLOGIC A:  Anxiety, chronic  P:   RASS goal: 0 Judicious use of benzos.  May need Precedex for anxiety. Restart home PO medications when taking PO (off Bipap)   FAMILY  - Updates: Updated wife. - I spent at least 80 minutes of critical care time with this pt today  Caryl Ada, DO 07/11/2015, 7:20 AM PGY-2, [facility]

## 2015-07-11 NOTE — Progress Notes (Signed)
eLink Physician-Brief Progress Note Patient Name: Lee Klein DOB: April 10, 1951 MRN: [id number]   Date of Service  07/11/2015  HPI/Events of Note  resp distress  / failure on nrm and prn bipap  Intake/Output Summary (Last 24 hours) at 07/11/15 1808 Last data filed at 07/11/15 1700  Gross per 24 hour  Intake   2420 ml  Output   1250 ml  Net   1170 ml     eICU Interventions  Ok to try lasix 40 mg IV now but if not effective then place back on bipap     Intervention Category Major Interventions: Respiratory failure - evaluation and management  Sandrea Hughs 07/11/2015, 6:07 PM

## 2015-07-11 NOTE — Progress Notes (Signed)
RT removed patient from bipap and placed on NRB. Patient tolerating well at this time. No complications. Vital signs stable at this time. RN and MD aware. RT will continue to monitor.

## 2015-07-11 NOTE — Progress Notes (Signed)
Patient placed back on bipap with previous settings. No complications. Vitals stable at this time. RN and family at bedside. RT will continue to monitor.

## 2015-07-11 NOTE — Progress Notes (Signed)
Daily Rounding Note  07/11/2015, 8:48 AM  LOS: 6 days   SUBJECTIVE:       Ostomy output is minimal, he is npo.  Abdominal pain imporved. Unable to tolerate weaning off bipap yesterday  OBJECTIVE:         Vital signs in last 24 hours:    Temp:  [97 F (36.1 C)-98.5 F (36.9 C)] 97.7 F (36.5 C) (02/23 0754) Pulse Rate:  [89-120] 92 (02/23 0700) Resp:  [14-31] 20 (02/23 0700) BP: (80-150)/(38-112) 115/100 mmHg (02/23 0700) SpO2:  [88 %-100 %] 91 % (02/23 0700) FiO2 (%):  [50 %] 50 % (02/23 0400) Weight:  [75.6 kg (166 lb 10.7 oz)] 75.6 kg (166 lb 10.7 oz) (02/23 0500) Last BM Date: 07/10/15 Filed Weights   07/09/15 0500 07/10/15 0349 07/11/15 0500  Weight: 77.9 kg (171 lb 11.8 oz) 75.3 kg (166 lb 0.1 oz) 75.6 kg (166 lb 10.7 oz)   General: looks awful, gravely ill looking, edematous Heart: RRR Chest: clear, bipap in place Abdomen: soft,  Hypoactive BS.  No obvious tenderness.  Extremities: anasarca on all limbs with impressive edema.  Skin thin with sores Neuro/Psych:  Unable to hear me.  Did not awaken during exam.  No attempt made to test neuro function.   Intake/Output from previous day: 02/22 0701 - 02/23 0700 In: 2623.9 [I.V.:167.2; IV Piggyback:300; TPN:2156.7] Out: 2250 [Urine:2250]  Intake/Output this shift:    Lab Results:  Recent Labs  07/09/15 1233 07/10/15 0016 07/11/15 0627  WBC 3.8* 33.5* 35.1*  HGB 11.9* 11.4* 11.1*  HCT 36.0* 34.1* 33.1*  PLT 43* 23* 22*   BMET  Recent Labs  07/09/15 1233 07/10/15 0016 07/11/15 0627  NA 135 138 141  K 3.9 4.6 4.1  CL 108 106 108  CO2 19* 21* 27  GLUCOSE 304* 123* 135*  BUN 19 17 26*  CREATININE 1.41* 1.55* 1.54*  CALCIUM 7.3* 7.5* 7.8*   LFT  Recent Labs  07/09/15 1233 07/10/15 1100 07/11/15 0627  PROT 3.6* 3.5* 3.7*  ALBUMIN 1.5* 1.4* 1.3*  AST 68* 79* 62*  ALT 60 54 48  ALKPHOS 300* 218* 200*  BILITOT 1.8* 2.2* 1.6*  BILIDIR   --  1.2*  --   IBILI  --  1.0*  --    PT/INR  Recent Labs  07/09/15 1234  LABPROT 23.4*  INR 2.10*   Hepatitis Panel  Recent Labs  07/10/15 1114  HEPBSAG Negative    Studies/Results: Ct Abdomen Pelvis W Contrast  07/10/2015  CLINICAL DATA:  Gram negative sepsis and patient admitted to the hospital 06/26/2015 with nausea and vomiting thought to be due to pancreatitis. Question pancreatic abscess or necrosis or complicating feature related to Crohn's disease. Subsequent encounter. EXAM: CT ABDOMEN AND PELVIS WITH CONTRAST TECHNIQUE: Multidetector CT imaging of the abdomen and pelvis was performed using the standard protocol following bolus administration of intravenous contrast. CONTRAST:  80 mL OMNIPAQUE IOHEXOL 300 MG/ML  SOLN COMPARISON:  CT abdomen and pelvis 05/29/2015, 11/06/2014 and 09/16/2013. FINDINGS: Since the prior CT scan the patient has developed moderate bilateral pleural effusions. No pericardial effusion is identified. Heart size is normal. There is bilateral lower lobe airspace disease. Since the prior CT scan, the patient has developed a small to moderate volume of abdominal and pelvic ascites. Little to no peripancreatic stranding is seen. A low attenuating lesion in the body of the pancreas measuring 1 cm in diameter was present on the  most recent CT scan and is unchanged. It is not identified on the earlier CT scans and likely represents a pseudocyst. The pancreas is otherwise unremarkable. The gallbladder has been removed. The liver, spleen and adrenal glands appear normal. Decreased attenuation in the posterior aspect of the right kidney on both the early and delayed phase imaging appears be due to artifact as the patient was scanned with his arms down. Small hiatal hernia is noted. The stomach is otherwise unremarkable. 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 Peristomal hernia containing loops small bowel  appears unchanged. There is no bowel obstruction. No free intraperitoneal air, pneumatosis or portal venous gas is identified. Diffuse body wall edema is noted. 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 The patient has lumbar spondylosis with loss of disc space height appearing worst at L2-3 and L5-S1. IMPRESSION: The exam is negative for pancreatic necrosis or abscess or complication related to Crohn's disease. The biliary tree appears normal. New moderate bilateral pleural effusions with extensive basilar airspace disease which could be due to pneumonia and/or pulmonary edema. New moderate volume of abdominal ascites without focal fluid collection. 1.0 cm hypoattenuating lesion the body of the pancreas was present on the most recent CT scan but new since the earlier studies and likely represents a pseudocyst. Hypoattenuation in the posterior aspect of the right kidney appears to be due to artifact as the patient was scanned with his arms down. Correlation with urinalysis could be used to exclude urinary tract infection. Avascular necrosis of the femoral heads bilaterally. Electronically Signed   By: Drusilla Kanner M.D.   On: 07/10/2015 12:55   Dg Chest Port 1 View  07/11/2015  CLINICAL DATA:  Shortness of breath. EXAM: PORTABLE CHEST 1 VIEW COMPARISON:  07/09/2015, 05/13/2015, 11/07/2014, 06/11/2014. CT 11/06/2014 . FINDINGS: Interim removal of right PICC line. Left IJ line in stable position. Stable cardiomegaly. Persistent but slightly improving bilateral pulmonary infiltrates/ edema. Low lung volumes with basilar atelectasis. Lucency noted over the right hemidiaphragm is stable and most likely related to diaphragm contour, prominent pleural fat, and basilar atelectasis. No acute bony abnormality. IMPRESSION: 1. Interim removal of right PICC line. Left IJ line stable position. 2. Persistent but partially clearing bilateral pulmonary edema and/or pneumonia. Persistent low lung volumes  with basilar atelectasis. Electronically Signed   By: Maisie Fus  Register   On: 07/11/2015 07:16   Dg Chest Port 1 View  07/09/2015  CLINICAL DATA:  Central line placement in patient with possible ARDS. EXAM: PORTABLE CHEST 1 VIEW COMPARISON:  Earlier the same day FINDINGS: 1958 hours. Left IJ central venous catheter is new in the interval with catheter tip overlying the SVC/RA junction, possibly just into the upper right atrium. No evidence for left pneumothorax. Right PICC line remains in place with tip also near the SVC/ RA junction, possibly just into the upper right atrium. The bilateral diffuse airspace disease has decreased in the interval, especially in the upper lungs. Retrocardiac collapse/ consolidation with potential left pleural effusion as before. Telemetry leads overlie the chest. IMPRESSION: New left IJ central line tip overlies the upper right atrium without evidence for pneumothorax. Slight interval decrease in bilateral airspace disease. Electronically Signed   By: Kennith Center M.D.   On: 07/09/2015 20:09   Dg Chest Port 1 View  07/09/2015  CLINICAL DATA:  Dyspnea EXAM: PORTABLE CHEST 1 VIEW COMPARISON:  07/04/2014 FINDINGS: Right upper extremity PICC, tip at the upper right atrium  in the setting of lower lung volumes. Symmetric bilateral airspace disease with peripheral sparing. The left diaphragm is obscured, likely by pleural fluid. Low lung volumes. No historical indication of hemoptysis. Borderline cardiomegaly. IMPRESSION: Extensive airspace disease with symmetry favoring edema over bilateral pneumonia. Electronically Signed   By: Marnee Spring M.D.   On: 07/09/2015 11:37   Scheduled Meds: . antiseptic oral rinse  7 mL Mouth Rinse q12n4p  . chlorhexidine  15 mL Mouth Rinse BID  . hydrocortisone sod succinate (SOLU-CORTEF) inj  20 mg Intravenous QPM  . hydrocortisone sod succinate (SOLU-CORTEF) inj  40 mg Intravenous QAC breakfast  . insulin aspart  0-9 Units Subcutaneous 4 times  per day  . meropenem (MERREM) IV  1 g Intravenous 3 times per day  . pantoprazole (PROTONIX) IV  40 mg Intravenous Q24H  . pneumococcal 23 valent vaccine  0.5 mL Intramuscular Tomorrow-1000  . sodium chloride  1,000 mL Intravenous Once  . sodium chloride flush  3 mL Intravenous Q12H   Continuous Infusions: . Marland KitchenTPN (CLINIMIX-E) Adult 80 mL/hr at 07/10/15 1706   And  . fat emulsion 240 mL (07/10/15 1707)  . Marland KitchenTPN (CLINIMIX-E) Adult     And  . fat emulsion     PRN Meds:.HYDROmorphone (DILAUDID) injection, LORazepam, promethazine, sodium chloride flush  ASSESMENT:   * Acute pancreatitis (suggested on ultrasound), ? abx induced. Tygacil discontinued (as was done in past for ? causing pancreatitis). Hx pancreatitis in 10/2014 attributed to Cefoxitin vs Tigecycline then. Latest abx is Meropenem.  Biliary tree normal, new small pseudocyst, moderate ascites, no pancreatic necrosis on CT  Lipase now normal. Bile ducts normal, GB out.   *  Elevated LFTs, steadily improving.  ? Drug induced liver injury.  No signs biliary duct obstruction on CT.  The TPN may yet cause rise in LFTs.   * Osteomyelitis, many operations for septic arthritis of hand.   *  Respiratory failure.  Pulmonary edema.  AKI/ metabollic acidosis.  Gram neg sepsis.   * Long hx Crohns. Many surgeries including ileostomy. Steroid dependent/adrenal insufficiency. Dr Karilyn Cota is GI doc.  Only IBD type med listed PTA was Prednisone, now on Solumedrol.    *  AKI.   * Chronic nausea, anorexia. Malnutrition. Started on TPN.  NPO.   *  Anemia.  Chronic, tends towards macrocytosis.  Folate, B12 ok.   * Fatty liver.   * Hearing loss.   * Thrombocytopenia.     PLAN   *  GI will sign off.  Call for reinvolvement.  Advance diet as you feel he will tolerate, sooner he can get off TPN the better.  Transition back to po prednisone as his condition allows.  Will follow up with Dr Karilyn Cota.     Jennye Moccasin   07/11/2015, 8:48 AM Pager: [phone]

## 2015-07-11 NOTE — Progress Notes (Signed)
Subjective:  Feeling better but still on BIPAP this am, PICC line removed  Antibiotics:  Anti-infectives    Start     Dose/Rate Route Frequency Ordered Stop   07/10/15 0400  vancomycin (VANCOCIN) IVPB 750 mg/150 ml premix  Status:  Discontinued     750 mg 150 mL/hr over 60 Minutes Intravenous Every 12 hours 07/09/15 1545 07/10/15 0904   07/09/15 1515  vancomycin (VANCOCIN) 2,000 mg in sodium chloride 0.9 % 500 mL IVPB     2,000 mg 250 mL/hr over 120 Minutes Intravenous  Once 07/09/15 1500 07/09/15 1846   07/09/15 1515  meropenem (MERREM) 1 g in sodium chloride 0.9 % 100 mL IVPB     1 g 200 mL/hr over 30 Minutes Intravenous 3 times per day 07/09/15 1500     07/02/2015 2000  tigecycline (TYGACIL) 50 mg in sodium chloride 0.9 % 100 mL IVPB  Status:  Discontinued     50 mg 200 mL/hr over 30 Minutes Intravenous Every 12 hours 07/09/2015 1511 07/08/15 1217   07/09/2015 1600  dextrose 5 % 300 mL with azithromycin (ZITHROMAX) 600 mg infusion  Status:  Discontinued     150 mL/hr  Intravenous Continuous 06/20/2015 1521 06/20/2015 1525   07/11/2015 1600  dextrose 5 % 300 mL with azithromycin (ZITHROMAX) 600 mg infusion  Status:  Discontinued     150 mL/hr  Intravenous Every 24 hours 07/11/2015 1525 07/09/15 1500   07/13/2015 1530  azithromycin (ZITHROMAX) 500 mg in dextrose 5 % 250 mL IVPB  Status:  Discontinued     500 mg 250 mL/hr over 60 Minutes Intravenous Every 24 hours 07/04/2015 1517 06/28/2015 1521      Medications: Scheduled Meds: . antiseptic oral rinse  7 mL Mouth Rinse q12n4p  . chlorhexidine  15 mL Mouth Rinse BID  . furosemide  40 mg Intravenous BID  . hydrocortisone sod succinate (SOLU-CORTEF) inj  20 mg Intravenous QPM  . hydrocortisone sod succinate (SOLU-CORTEF) inj  40 mg Intravenous QAC breakfast  . insulin aspart  0-9 Units Subcutaneous 4 times per day  . meropenem (MERREM) IV  1 g Intravenous 3 times per day  . pantoprazole (PROTONIX) IV  40 mg Intravenous Q24H  .  pneumococcal 23 valent vaccine  0.5 mL Intramuscular Tomorrow-1000  . sodium chloride  1,000 mL Intravenous Once  . sodium chloride flush  3 mL Intravenous Q12H   Continuous Infusions: . Marland KitchenTPN (CLINIMIX-E) Adult 80 mL/hr at 07/11/15 0800   And  . fat emulsion 240 mL (07/10/15 1707)  . Marland KitchenTPN (CLINIMIX-E) Adult     And  . fat emulsion     PRN Meds:.HYDROmorphone (DILAUDID) injection, LORazepam, promethazine, sodium chloride flush    Objective: Weight change: 10.6 oz (0.3 kg)  Intake/Output Summary (Last 24 hours) at 07/11/15 1411 Last data filed at 07/11/15 1100  Gross per 24 hour  Intake 2236.67 ml  Output   1375 ml  Net 861.67 ml   Blood pressure 105/82, pulse 97, temperature 98.4 F (36.9 C), temperature source Oral, resp. rate 27, height _0  (1.778 m), weight 166 lb 10.7 oz (75.6 kg), SpO2 94 %. Temp:  [97.6 F (36.4 C)-98.5 F (36.9 C)] 98.4 F (36.9 C) (02/23 1205) Pulse Rate:  [88-124] 97 (02/23 1305) Resp:  [14-31] 27 (02/23 1305) BP: (80-150)/(38-106) 105/82 mmHg (02/23 1305) SpO2:  [79 %-99 %] 94 % (02/23 1305) FiO2 (%):  [50 %-100 %] 100 % (02/23 1305) Weight:  [885  lb 10.7 oz (75.6 kg)] 166 lb 10.7 oz (75.6 kg) (02/23 0500)  Physical Exam: General: alert on BIPAP  HEENT: anicteric sclera,  EOMI, deaf, COLOR MUCH BETTER CVS tachy rate, normal r,  no murmur rubs or gallops Chest: on BIPAP Abdomen: soft  Less tender Extremities:his surgical site is clean Skin: echymoses, edema diffusely, PICC site is oozing Neuro: nonfocal  CBC: CBC Latest Ref Rng 07/11/2015 07/10/2015 07/09/2015  WBC 4.0 - 10.5 K/uL 35.1(H) 33.5(H) 3.8(L)  Hemoglobin 13.0 - 17.0 g/dL 11.1(L) 11.4(L) 11.9(L)  Hematocrit 39.0 - 52.0 % 33.1(L) 34.1(L) 36.0(L)  Platelets 150 - 400 K/uL 22(LL) 23(LL) 43(L)      BMET  Recent Labs  07/10/15 0016 07/11/15 0627  NA 138 141  K 4.6 4.1  CL 106 108  CO2 21* 27  GLUCOSE 123* 135*  BUN 17 26*  CREATININE 1.55* 1.54*  CALCIUM 7.5* 7.8*       Liver Panel   Recent Labs  07/10/15 1100 07/11/15 0627  PROT 3.5* 3.7*  ALBUMIN 1.4* 1.3*  AST 79* 62*  ALT 54 48  ALKPHOS 218* 200*  BILITOT 2.2* 1.6*  BILIDIR 1.2*  --   IBILI 1.0*  --        Sedimentation Rate No results for input(s): ESRSEDRATE in the last 72 hours. C-Reactive Protein No results for input(s): CRP in the last 72 hours.  Micro Results: Recent Results (from the past 720 hour(s))  Culture, blood (Routine X 2) w Reflex to ID Panel     Status: None (Preliminary result)   Collection Time: 07/09/15  1:30 PM  Result Value Ref Range Status   Specimen Description BLOOD RIGHT HAND  Final   Special Requests IN PEDIATRIC BOTTLE 1CC  Final   Culture  Setup Time   Final    GRAM NEGATIVE RODS AEROBIC BOTTLE ONLY CRITICAL RESULT CALLED TO, READ BACK BY AND VERIFIED WITH: F FLYNT,RN _0  07/10/15 MKELLY    Culture   Final    GRAM NEGATIVE RODS IDENTIFICATION AND SUSCEPTIBILITIES TO FOLLOW    Report Status PENDING  Incomplete  Culture, blood (Routine X 2) w Reflex to ID Panel     Status: None (Preliminary result)   Collection Time: 07/09/15  1:40 PM  Result Value Ref Range Status   Specimen Description BLOOD RIGHT ARM  Final   Special Requests IN PEDIATRIC BOTTLE 2CC  Final   Culture  Setup Time   Final    GRAM NEGATIVE RODS AEROBIC BOTTLE ONLY CRITICAL RESULT CALLED TO, READ BACK BY AND VERIFIED WITH: F FLYNT,RN _1  07/10/15 MKELLY    Culture   Final    GRAM NEGATIVE RODS IDENTIFICATION AND SUSCEPTIBILITIES TO FOLLOW    Report Status PENDING  Incomplete  MRSA PCR Screening     Status: None   Collection Time: 07/09/15  2:02 PM  Result Value Ref Range Status   MRSA by PCR NEGATIVE NEGATIVE Final    Comment:        The GeneXpert MRSA Assay (FDA approved for NASAL specimens only), is one component of a comprehensive MRSA colonization surveillance program. It is not intended to diagnose MRSA infection nor to guide or monitor treatment  for MRSA infections.   Culture, blood (Routine X 2) w Reflex to ID Panel     Status: None (Preliminary result)   Collection Time: 07/10/15  1:20 PM  Result Value Ref Range Status   Specimen Description BLOOD RIGHT HAND  Final   Special Requests IN PEDIATRIC  BOTTLE .5CC  Final   Culture NO GROWTH < 24 HOURS  Final   Report Status PENDING  Incomplete  Culture, blood (Routine X 2) w Reflex to ID Panel     Status: None (Preliminary result)   Collection Time: 07/10/15  1:43 PM  Result Value Ref Range Status   Specimen Description BLOOD RIGHT HAND  Final   Special Requests IN PEDIATRIC BOTTLE 3CC  Final   Culture NO GROWTH < 24 HOURS  Final   Report Status PENDING  Incomplete    Studies/Results: Ct Abdomen Pelvis W Contrast  07/10/2015  CLINICAL DATA:  Gram negative sepsis and patient admitted to the hospital 06/23/2015 with nausea and vomiting thought to be due to pancreatitis. Question pancreatic abscess or necrosis or complicating feature related to Crohn's disease. Subsequent encounter. EXAM: CT ABDOMEN AND PELVIS WITH CONTRAST TECHNIQUE: Multidetector CT imaging of the abdomen and pelvis was performed using the standard protocol following bolus administration of intravenous contrast. CONTRAST:  80 mL OMNIPAQUE IOHEXOL 300 MG/ML  SOLN COMPARISON:  CT abdomen and pelvis 05/29/2015, 11/06/2014 and 09/16/2013. FINDINGS: Since the prior CT scan the patient has developed moderate bilateral pleural effusions. No pericardial effusion is identified. Heart size is normal. There is bilateral lower lobe airspace disease. Since the prior CT scan, the patient has developed a small to moderate volume of abdominal and pelvic ascites. Little to no peripancreatic stranding is seen. A low attenuating lesion in the body of the pancreas measuring 1 cm in diameter was present on the most recent CT scan and is unchanged. It is not identified on the earlier CT scans and likely represents a pseudocyst. The pancreas is  otherwise unremarkable. The gallbladder has been removed. The liver, spleen and adrenal glands appear normal. Decreased attenuation in the posterior aspect of the right kidney on both the early and delayed phase imaging appears be due to artifact as the patient was scanned with his arms down. Small hiatal hernia is noted. The stomach is otherwise unremarkable. 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 Peristomal hernia containing loops small bowel appears unchanged. There is no bowel obstruction. No free intraperitoneal air, pneumatosis or portal venous gas is identified. Diffuse body wall edema is noted. 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 The patient has lumbar spondylosis with loss of disc space height appearing worst at L2-3 and L5-S1. IMPRESSION: The exam is negative for pancreatic necrosis or abscess or complication related to Crohn's disease. The biliary tree appears normal. New moderate bilateral pleural effusions with extensive basilar airspace disease which could be due to pneumonia and/or pulmonary edema. New moderate volume of abdominal ascites without focal fluid collection. 1.0 cm hypoattenuating lesion the body of the pancreas was present on the most recent CT scan but new since the earlier studies and likely represents a pseudocyst. Hypoattenuation in the posterior aspect of the right kidney appears to be due to artifact as the patient was scanned with his arms down. Correlation with urinalysis could be used to exclude urinary tract infection. Avascular necrosis of the femoral heads bilaterally. Electronically Signed   By: Inge Rise M.D.   On: 07/10/2015 12:55   Dg Chest Port 1 View  07/11/2015  CLINICAL DATA:  Shortness of breath. EXAM: PORTABLE CHEST 1 VIEW COMPARISON:  07/09/2015, 05/13/2015, 11/07/2014, 06/11/2014. CT 11/06/2014 . FINDINGS: Interim removal of right PICC line. Left IJ line in stable  position. Stable cardiomegaly. Persistent but slightly improving bilateral  pulmonary infiltrates/ edema. Low lung volumes with basilar atelectasis. Lucency noted over the right hemidiaphragm is stable and most likely related to diaphragm contour, prominent pleural fat, and basilar atelectasis. No acute bony abnormality. IMPRESSION: 1. Interim removal of right PICC line. Left IJ line stable position. 2. Persistent but partially clearing bilateral pulmonary edema and/or pneumonia. Persistent low lung volumes with basilar atelectasis. Electronically Signed   By: Marcello Moores  Register   On: 07/11/2015 07:16   Dg Chest Port 1 View  07/09/2015  CLINICAL DATA:  Central line placement in patient with possible ARDS. EXAM: PORTABLE CHEST 1 VIEW COMPARISON:  Earlier the same day FINDINGS: 1958 hours. Left IJ central venous catheter is new in the interval with catheter tip overlying the SVC/RA junction, possibly just into the upper right atrium. No evidence for left pneumothorax. Right PICC line remains in place with tip also near the SVC/ RA junction, possibly just into the upper right atrium. The bilateral diffuse airspace disease has decreased in the interval, especially in the upper lungs. Retrocardiac collapse/ consolidation with potential left pleural effusion as before. Telemetry leads overlie the chest. IMPRESSION: New left IJ central line tip overlies the upper right atrium without evidence for pneumothorax. Slight interval decrease in bilateral airspace disease. Electronically Signed   By: Misty Stanley M.D.   On: 07/09/2015 20:09      Assessment/Plan:  INTERVAL HISTORY:   07/09/15: patient with worsening abdominal pain, difficulty breathing transfer to ICU for NIPPV 07/10/15: Blood cultures with 2/2 GNR 07/11/15: PICC removed, CT without abscess or necroisis in pancreas  Principal Problem:   Acute pancreatitis Active Problems:   Crohn's disease ([place])   Atypical mycobacterial infection of hand   AKI  (acute kidney injury) ([place])   PICC (peripherally inserted central catheter) in place   Chronic adrenal insufficiency (HCC)   Epigastric abdominal pain   Non-intractable cyclical vomiting with nausea   Anorexia   Abnormal loss of weight   Crohn's disease involving terminal ileum (HCC)   Crohn disease ([place])   Elevated LFTs   Osteomyelitis of right wrist (HCC)   Osteomyelitis of left wrist (HCC)   Pyogenic arthritis of left wrist (HCC)   Malnutrition of moderate degree   Acute hypoxemic respiratory failure (HCC)   Sinus tachycardia (HCC)   Sepsis (HCC)   Acute pulmonary edema (HCC)   Lactic acidosis   History of small bowel obstruction   ARDS (adult respiratory distress syndrome) ([place])   Septic shock ([place])   PICC line infection   Gram negative sepsis (HCC)    Lee Klein is a 65 y.o. 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 He is admitted for possible pancreatitis with an elevated lipase in the setting of nausea and vomiting. We had stopped his tigecycline yesterday in hopes that this was potentially the culprit antibiotic for causing his nausea vomiting and abdominal pain. Unfortunately his abdominal pain worsened and he developed sepsis from [place]. CT shows possible pancreatic pseudo-cyst but no necrosis and no intra-abdominal abscesses.  #1 Gram negative sepsis Potential sources myriad but at this point I am thinking the PICC may have been culprit given prior Serratia bacteremia  --continue MERREM --follow-up blood cultures  --when stable place peripheral IV's and get his central line out and give a "holiday from venous catheters" --would treat for 2  weeks from when "holiday" starts  #2 Nausea and vomiting with pancreatitis. Hepatitis: --going to watch the patient off his antimycobacterial  drugs and hope we'll continue to improve hopefully won't need TPN for very long  #3 Bilateral infiltrates with hypoxia: agree this could be pulmonary edema from cardiogenic source, it could be aRDS and pancreatitis. It could be multi focal pneumonia.though I think more likely it is edema vs ARDS  #4 thrombocytopenia: Concern for hit exists and primary team is managing he could have superimposed infection on top of this.  #5 Mycobacterium abscesses osteomyelitis and septic arthritis.: We are going to take a "holiday" from his anti-Mycobacterium antibiotics for now. Hopefully if he recovers from his acute illness and we can then revisit whether to rechallenge him again with these antibiotics or observe him off of them.  I spent greater than 35 miinutes with the patient including greater than 50% of time in face to face counsel of the patient and his wife regarding his Gram negative bacteremia, sepsis,  his Mycobacterium abscesses osteomyelitis, his nausea vomiting and possible pancreatitis his worsening abdominal pain worsening hypoxemia with bilateral pulmonary infiltrates thrombocytopenia and in coordination of his care withCCM.  Greatly appreciate the diligent and careful attention he has received from   [place], GI and Primary team.    LOS: 6 days   Alcide Evener 07/11/2015, 2:11 PM

## 2015-07-11 NOTE — Progress Notes (Signed)
PARENTERAL NUTRITION CONSULT NOTE - FOLLOW UP  Pharmacy Consult:  TPN Indication:  Severe pancreatitis/expected prolonged NPO status  Allergies  Allergen Reactions  . Amikacin Other (See Comments)    CAUSED DEAFNESS  . Cefoxitin Nausea Only    ABD. PAIN  . Fish Allergy Nausea And Vomiting  . Iodine Nausea Only    JOINT PAIN  . Ivp Dye [Iodinated Diagnostic Agents] Nausea Only    JOINT PAIN  . Rifampin Nausea And Vomiting and Other (See Comments)    CHILLS  . Shellfish Allergy Swelling  . Vancomycin Other (See Comments)    Red Man Syndrome - was told that it was given to fast.  . Demerol Rash    Patient Measurements: Height: 5\' 10"  (177.8 cm) Weight: 166 lb 10.7 oz (75.6 kg) IBW/kg (Calculated) : 73  Weight from Dec 2016 = 69 kg  Vital Signs: Temp: 98.1 F (36.7 C) (02/23 0333) Temp Source: Axillary (02/23 0333) BP: 115/100 mmHg (02/23 0700) Pulse Rate: 92 (02/23 0700) Intake/Output from previous day: 02/22 0701 - 02/23 0700 In: 2623.9 [I.V.:167.2; IV Piggyback:300; TPN:2156.7] Out: 2250 [Urine:2250]  Labs:  Recent Labs  07/09/15 1233  07/09/15 1234 07/09/15 1842 07/10/15 0016 07/10/15 0814 07/11/15 0627  WBC 3.8*  --   --   --  33.5*  --  35.1*  HGB 11.9*  --   --   --  11.4*  --  11.1*  HCT 36.0*  --   --   --  34.1*  --  33.1*  PLT 43*  --   --   --  23*  --  22*  APTT  --   < > 43* 95* 94* 88*  --   INR  --   --  2.10*  --   --   --   --   < > = values in this interval not displayed.   Recent Labs  07/09/15 0440 07/09/15 1233 07/10/15 0016 07/10/15 1100 07/11/15 0627  NA 137 135 138  --  141  K 4.2 3.9 4.6  --  4.1  CL 110 108 106  --  108  CO2 21* 19* 21*  --  27  GLUCOSE 107* 304* 123*  --  135*  BUN 16 19 17   --  26*  CREATININE 0.98 1.41* 1.55*  --  1.54*  CALCIUM 7.4* 7.3* 7.5*  --  7.8*  MG 2.0  --  1.8  --  1.9  PHOS 2.5  --  5.0*  --  3.0  PROT  --  3.6*  --  3.5* 3.7*  ALBUMIN  --  1.5*  --  1.4* 1.3*  AST  --  68*  --  79*  62*  ALT  --  60  --  54 48  ALKPHOS  --  300*  --  218* 200*  BILITOT  --  1.8*  --  2.2* 1.6*  BILIDIR  --   --   --  1.2*  --   IBILI  --   --   --  1.0*  --    Estimated Creatinine Clearance: 51.4 mL/min (by C-G formula based on Cr of 1.54).    Recent Labs  07/10/15 1847 07/10/15 2347 07/11/15 0557  GLUCAP 135* 144* 129*     Insulin Requirements in the past 24 hours:  1 unit SSI  Assessment: 33 YOM with a history of Crohn's disease s/p ileostomy presented on 07/20/2015 with intermittent epigastric abdominal pain, cramping, and  vomiting for 2 months PTA.  Consulting ID to evaluate antibiotic therapy that could contribute to patient's symptoms.  Per documentation, patient is a poor candidate for surgical J-tube placement and while awaiting for antibiotic changes and resolution of GI symptoms for adequate PO intake, pharmacy consulted to manage TPN for nutritional support.  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  GI: hx Crohn's / GERD / pancreatitis - baseline prealbumin low at 4.3.  Ileostomy O/P not charted.  Considering Marinol to stimulate appetite.  2/22 CT showed possible pseudocyst, negative for pancreatitis.  PPI IV Endo: chronic AI, no hx DM - hypoglycemia prior to TPN initiation and while on steroid.  CBGs controlled post TPN initiation. Lytes: all WNL Renal: SCr 1.54, CrCL 51 ml/min - good UOP 1.2 ml/kr/hr Pulm: transferred to ICU 2/21 d/t resp distress - on BiPAP.  CXR shows improving pulm edema/PNA. Cards: no hx - BP soft, tachy, troponin 0.06 AC/Heme: Xarelto PTA for hx LLE DVT dx 02/2015, completed treatment course >> Angiomax started 2/21 for r/o HIT.  Mild anemia, plts decreased to 22K, INR 2.1 (iron level low at 8 - f/u supplementation when possible) Hepatobil: ALT normalized, others elevated.  Tbili improved to 1.6.  Lipase / TG WNL. Neuro: anxiety /  hearing loss - possible cochlear implant ID: Merrem D#3 for ?GNR bacteremia, azith/clofazimine/tigecycline from PTA for hx M.abscessus osteo held - afebrile, WBC increased to 35.1 (steroid dose increased for CT since allergic to contrast 2/22), LA decreased to 2.8  Azith PTA >> 2/21 Clofazimine PTA >> 2/21 Tygacil PTA >> 2/20 (?caused pancreatitis) Vanc 2/21 >> 2/22 Merrem 2/21 >>  Best Practices: Angiomax TPN Access: PICC placed 10/19/14 (for long-term abx administration) TPN start date: 2/20 >>  Current Nutrition:  Clear liquid diet TPN  Nutritional Goals:  2100-2300 kCal and 115-130 grams of protein per day   Plan:  - Increase Clinimix E 5/15 to goal rate of 100 ml/hr + ILE 20% at 10 ml/hr.  TPN will provide 2184 kCal and 120gm protein daily, meeting 100% of patient's needs.  Cycle once stable at goal TPN rate. - Daily multivitamin and trace elements - Continue sensitive SSI Q6H.  D/C if CBGs remain controlled at goal TPN rate   Perle Gibbon D. Laney Potash, PharmD, BCPS Pager:  [phone] 07/11/2015, 8:14 AM

## 2015-07-11 NOTE — Progress Notes (Signed)
Notified Britt Bolognese MD Resident in regards to Lactic acid of 2.8. Will continue to monitor and assess.

## 2015-07-12 ENCOUNTER — Inpatient Hospital Stay (HOSPITAL_COMMUNITY): Payer: Managed Care, Other (non HMO)

## 2015-07-12 DIAGNOSIS — Z452 Encounter for adjustment and management of vascular access device: Secondary | ICD-10-CM

## 2015-07-12 DIAGNOSIS — A498 Other bacterial infections of unspecified site: Secondary | ICD-10-CM

## 2015-07-12 DIAGNOSIS — I82409 Acute embolism and thrombosis of unspecified deep veins of unspecified lower extremity: Secondary | ICD-10-CM

## 2015-07-12 LAB — CULTURE, BLOOD (ROUTINE X 2)

## 2015-07-12 LAB — GLUCOSE, CAPILLARY
GLUCOSE-CAPILLARY: 61 mg/dL — AB (ref 65–99)
GLUCOSE-CAPILLARY: 84 mg/dL (ref 65–99)
Glucose-Capillary: 143 mg/dL — ABNORMAL HIGH (ref 65–99)
Glucose-Capillary: 144 mg/dL — ABNORMAL HIGH (ref 65–99)
Glucose-Capillary: 75 mg/dL (ref 65–99)
Glucose-Capillary: 79 mg/dL (ref 65–99)
Glucose-Capillary: 94 mg/dL (ref 65–99)

## 2015-07-12 LAB — CBC
HEMATOCRIT: 30 % — AB (ref 39.0–52.0)
Hemoglobin: 10 g/dL — ABNORMAL LOW (ref 13.0–17.0)
MCH: 32.7 pg (ref 26.0–34.0)
MCHC: 33.3 g/dL (ref 30.0–36.0)
MCV: 98 fL (ref 78.0–100.0)
PLATELETS: 21 10*3/uL — AB (ref 150–400)
RBC: 3.06 MIL/uL — ABNORMAL LOW (ref 4.22–5.81)
RDW: 17.4 % — AB (ref 11.5–15.5)
WBC: 26.7 10*3/uL — AB (ref 4.0–10.5)

## 2015-07-12 LAB — BASIC METABOLIC PANEL
Anion gap: 6 (ref 5–15)
BUN: 35 mg/dL — AB (ref 6–20)
CHLORIDE: 107 mmol/L (ref 101–111)
CO2: 30 mmol/L (ref 22–32)
Calcium: 8 mg/dL — ABNORMAL LOW (ref 8.9–10.3)
Creatinine, Ser: 1.25 mg/dL — ABNORMAL HIGH (ref 0.61–1.24)
GFR calc Af Amer: >60 mL/min (ref >60)
GFR calc non Af Amer: >60 mL/min (ref >60)
GLUCOSE: 140 mg/dL — AB (ref 65–99)
POTASSIUM: 3.8 mmol/L (ref 3.5–5.1)
Sodium: 143 mmol/L (ref 135–145)

## 2015-07-12 LAB — SEROTONIN RELEASE ASSAY (SRA)
SRA .2 IU/mL UFH Ser-aCnc: 4 % (ref 0–20)
SRA, HIGH DOSE HEPARIN: 4 % (ref 0–20)

## 2015-07-12 MED ORDER — FUROSEMIDE 10 MG/ML IJ SOLN
40.0000 mg | Freq: Once | INTRAMUSCULAR | Status: AC
  Administered 2015-07-12: 40 mg via INTRAVENOUS

## 2015-07-12 MED ORDER — LORAZEPAM 2 MG/ML IJ SOLN
0.5000 mg | Freq: Four times a day (QID) | INTRAMUSCULAR | Status: DC | PRN
  Administered 2015-07-13 – 2015-07-15 (×4): 1 mg via INTRAVENOUS
  Administered 2015-07-15: 0.5 mg via INTRAVENOUS
  Administered 2015-07-16 – 2015-07-18 (×2): 1 mg via INTRAVENOUS
  Administered 2015-07-18: 0.5 mg via INTRAVENOUS
  Administered 2015-07-19 – 2015-07-30 (×7): 1 mg via INTRAVENOUS
  Filled 2015-07-12 (×16): qty 1

## 2015-07-12 MED ORDER — DEXTROSE 50 % IV SOLN
INTRAVENOUS | Status: AC
  Filled 2015-07-12: qty 50

## 2015-07-12 MED ORDER — CEFTAZIDIME 2 G IJ SOLR
2.0000 g | Freq: Three times a day (TID) | INTRAMUSCULAR | Status: DC
  Administered 2015-07-12 – 2015-07-13 (×4): 2 g via INTRAVENOUS
  Filled 2015-07-12 (×6): qty 2

## 2015-07-12 MED ORDER — DEXTROSE 10 % IV SOLN
INTRAVENOUS | Status: DC
  Administered 2015-07-12 – 2015-07-14 (×2): 20 mL/h via INTRAVENOUS
  Administered 2015-07-15 (×2): via INTRAVENOUS

## 2015-07-12 MED ORDER — DEXTROSE 50 % IV SOLN
25.0000 mL | Freq: Once | INTRAVENOUS | Status: AC
  Administered 2015-07-12: 25 mL via INTRAVENOUS

## 2015-07-12 NOTE — Progress Notes (Signed)
Took pt off Bipap to NRB. Sat 91%. Pt comfortable.

## 2015-07-12 NOTE — Progress Notes (Signed)
PULMONARY / CRITICAL CARE MEDICINE   Name: Lee Klein MRN: [id number] DOB: 08/02/1950    ADMISSION DATE:  06/22/2015 CONSULTATION DATE:  07/09/15  REFERRING MD:  Dr. Oswaldo Done /  Internal Medicine  CHIEF COMPLAINT:  Abdominal pain and vomiting   BRIEF PATIENT DESCRIPTION : The patient was admitted for acute on chronic nausea and vomiting. He has Crohn's disease. Also with significant comorbidities. 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 He was treated as drug-induced pancreatitis. GI and ID have been following. He had lactic acidosis. Had fluid resuscitation. Had resp distress on 2/21. Chest x-ray with bilateral infiltrates, new from admission. He went into respiratory distress. O2 sats 60% on non breather mask. Transferred to ICU.  ABG prior to transfer was 7.26, PCO2 33, PO2 48 on 100% nonrebreather mask. He received 80 mg IV Lasix. Patient is 12 L positive since admission.  SIGNIFICANT EVENTS:  07/04/2015 admitted for nausea vomiting, possible drug-induced pancreatitis 07/09/15 went into resp distress: O2 sats 60% on NRBM; transferred to ICU   STUDIES : [name] Abd (2/17) >> Heterogeneous echogenicity of pancreas which could due pancreatic inflammation; small ascites; fatty liver 2D echo (05/2014) >> EF 50%, CHFpEF CT Abd/pelvis - negative for pancreatic necrosis or abscess or complication related to Crohn's disease. New moderate bilateral pleural effusions. New moderate volume of abdominal ascites. 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   IMAGING : CXR (2/17) >> no active dse CXR (2/21) >> acute B infiltrates  CULTURES: Blood culture (2/21) >> GNR BCx (2/22) >> NGTD  ANTIBIOTICS :  Clofazimine 2/17 >> 2/21 Azithromycin 2/17 >> 2/21 Tigecycline 2/17 -2/20 Vanc 2/21>> 2/22 Meropenem 2/21>>  LINES:  R PICC --since 03/2015 at least (wife does not remember) [facility] 2/21>> Right ileostomy   SUBJECTIVE:  Was able to be placed on NRB  part of yesterday but then had respiratory distress and placed back on Bipap Net pos balance Respiratory distress again this AM on BiPaP  VITAL SIGNS: BP 117/56 mmHg  Pulse 85  Temp(Src) 98.2 F (36.8 C) (Axillary)  Resp 26  Ht  (1.778 m)  Wt 169 lb 12.1 oz (77 kg)  BMI 24.36 kg/m2  SpO2 91%  HEMODYNAMICS:    VENTILATOR SETTINGS: Vent Mode:  [-]  FiO2 (%):  [60 %-100 %] 60 %on Bipap 12/6  80% FiO2.   INTAKE / OUTPUT: I/O last 3 completed shifts: In: 4049 [I.V.:140; IV Piggyback:500] Out: 2050 [Urine:2050]   PHYSICAL EXAMINATION:  General:  Wearing BiPAP. NAD, more alert Neuro:  Non-focal. Moves extremities equally. Deaf. Follows commands. Awake and alert HEENT:  BiPAP mask on face. NCAT Cardiovascular:  RRR. Good S1 and S2. No murmur rub or gallop.   Lungs: Normal WOB. Comfortable on the BiPAP. Diffuse rhonchi bilaterally Abdomen:  Soft. Decreased bowel sounds. No masses or tenderness. (+) Ostomy in R abd.  Musculoskeletal: +3 bilateral edema. Patient with chronic left hand osteomyelitis. Skin:  Warm and dry. No rash.  LABS:  BMET  Recent Labs Lab 07/09/15 1233 07/10/15 0016 07/11/15 0627  NA 135 138 141  K 3.9 4.6 4.1  CL 108 106 108  CO2 19* 21* 27  BUN 19 17 26*  CREATININE 1.41* 1.55* 1.54*  GLUCOSE 304* 123* 135*    Electrolytes  Recent Labs Lab 07/09/15 0440 07/09/15 1233 07/10/15 0016 07/11/15 0627  CALCIUM 7.4* 7.3* 7.5* 7.8*  MG 2.0  --  1.8 1.9  PHOS 2.5  --  5.0* 3.0  CBC  Recent Labs Lab 07/10/15 0016 07/11/15 0627 07/12/15 0452  WBC 33.5* 35.1* 26.7*  HGB 11.4* 11.1* 10.0*  HCT 34.1* 33.1* 30.0*  PLT 23* 22* 21*    Coag's  Recent Labs Lab 07/09/15 1234 07/09/15 1842 07/10/15 0016 07/10/15 0814  APTT 43* 95* 94* 88*  INR 2.10*  --   --   --     Sepsis Markers  Recent Labs Lab 07/09/15 1235 07/10/15 1308 07/11/15 0638  LATICACIDVEN 6.0* 3.6* 2.8*    ABG  Recent Labs Lab 07/09/15 1145  07/09/15 1535  PHART 7.266* 7.316*  PCO2ART 33.4* 35.0  PO2ART 47.9* 73.0*    Liver Enzymes  Recent Labs Lab 07/09/15 1233 07/10/15 1100 07/11/15 0627  AST 68* 79* 62*  ALT 60 54 48  ALKPHOS 300* 218* 200*  BILITOT 1.8* 2.2* 1.6*  ALBUMIN 1.5* 1.4* 1.3*    Cardiac Enzymes  Recent Labs Lab 07/09/15 1233 07/09/15 1842 07/10/15 0016  TROPONINI 0.03 0.06* 0.06*    Glucose  Recent Labs Lab 07/10/15 2347 07/11/15 0557 07/11/15 1141 07/11/15 1813 07/11/15 2334 07/12/15 0545  GLUCAP 144* 129* 102* 127* 144* 143*    Imaging No results found.  DISCUSSION: The patient was admitted with acute on chronic nausea and vomiting. He has Crohn's disease. Also with significant comorbidities. He has chronic left hand osteomyelitis (Mycobacteriun abscessus)  for which he is on chronic antibiotics. Since admission, he has been  treated as most likely drug-induced pancreatitis (Tigecycline).  On 2/21, pt had worsening resp status. Chest x-ray with bilateral infiltrates, new from admission. He went into respiratory distress. O2 sats 60% on non breather mask. Transferred to ICU.  ABG prior to transfer was 7.26, PCO2 33, PO2 48 on 100% nonrebreather mask. He received 80 mg IV Lasix. Patient is  12 L positive since admission.  ASSESSMENT / PLAN:  PULMONARY A: Acute hypoxemic respiratory failure secondary: Pulmonary edema, volume overload Acutely worsening pulmonary edema  P:   Currently on BiPAP; escalate down to NRB.  Failed wean off Bipap yesterday monitor for decompensation Continue Lasix increase to 80mg  BID  CARDIOVASCULAR A:  Sinus tachycardia from underlying pulmonary edema  Patient with diastolic dysfunction based on 1610 echo, EF 50%. Not known to have CAD.  P:  Monitor on telemtry Serial troponins stable  RENAL A:  Acute kidney injury. - improving Metabolic acidosis. improving Volume overload. P:   Lasix 80mg  BID Replace electrolytes as  indicated KVO Monitor UOP; goal neg balance  GASTROINTESTINAL A:  Crohn's disease requiring steroids. Drug-induced pancreatitis (Tigecycline)  - Lipase improved Chronic nausea, vomiting. History of antibiotic related diarrhea. Severe malnutrition P:   Hold TPN in setting of pos balance and worsening pulmonary edema GI signed-off. Continue steroids, daily Recheck of lipase decreased Clear liquid diet if on NRB CT Abd without abdominal infection  HEMATOLOGIC A:  Thrombocytopenia, acute and chronic. Baseline was in the 140s. Possible drug induced. HIT neg. H/O left lower extremity DVT in October 2016. By history, unprovoked. He was on Xarelto outpatient. 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 Abx for 1 yr. Consider healthcare associated pneumonia. Consider sepsis.  Lactic  acidosis - downtrending PICC likely source of bacteremia, grew Serratia; PICC removed P:   Abx as above; switch from meropenem to ceftazidime ID following - drug holiday for M. abscessus Serratia in 2/2 blood cultures (2/21), repeat on 2/22 (NGTD) Will need at least 2 weeks antibiotics  ENDOCRINE A:  Chronic adrenal insufficiency.  P:   Continue IV steroids Monitor sugars SSI  NEUROLOGIC A:  Anxiety, chronic  P:   RASS goal: 0 Judicious use of benzos.  May need Precedex for anxiety. Ativan .5-1 mg prn for anxiety Restart home PO medications when taking PO (off Bipap)   FAMILY  - Updates: Updated wife at bedside. - I spent at least 80 minutes of critical care time with this pt today  Caryl Ada, DO 07/12/2015, 7:33 AM PGY-2, [facility]

## 2015-07-12 NOTE — Progress Notes (Signed)
Subjective:  Is back on BiPAP today  Antibiotics:  Anti-infectives    Start     Dose/Rate Route Frequency Ordered Stop   07/12/15 1400  cefTAZidime (FORTAZ) 2 g in dextrose 5 % 50 mL IVPB     2 g 100 mL/hr over 30 Minutes Intravenous 3 times per day 07/12/15 1118     07/10/15 0400  vancomycin (VANCOCIN) IVPB 750 mg/150 ml premix  Status:  Discontinued     750 mg 150 mL/hr over 60 Minutes Intravenous Every 12 hours 07/09/15 1545 07/10/15 0904   07/09/15 1515  vancomycin (VANCOCIN) 2,000 mg in sodium chloride 0.9 % 500 mL IVPB     2,000 mg 250 mL/hr over 120 Minutes Intravenous  Once 07/09/15 1500 07/09/15 1846   07/09/15 1515  meropenem (MERREM) 1 g in sodium chloride 0.9 % 100 mL IVPB  Status:  Discontinued     1 g 200 mL/hr over 30 Minutes Intravenous 3 times per day 07/09/15 1500 07/12/15 1001   06/22/2015 2000  tigecycline (TYGACIL) 50 mg in sodium chloride 0.9 % 100 mL IVPB  Status:  Discontinued     50 mg 200 mL/hr over 30 Minutes Intravenous Every 12 hours 07/13/2015 1511 07/08/15 1217   06/27/2015 1600  dextrose 5 % 300 mL with azithromycin (ZITHROMAX) 600 mg infusion  Status:  Discontinued     150 mL/hr  Intravenous Continuous 07/06/2015 1521 06/27/2015 1525   07/04/2015 1600  dextrose 5 % 300 mL with azithromycin (ZITHROMAX) 600 mg infusion  Status:  Discontinued     150 mL/hr  Intravenous Every 24 hours 07/03/2015 1525 07/09/15 1500   07/15/2015 1530  azithromycin (ZITHROMAX) 500 mg in dextrose 5 % 250 mL IVPB  Status:  Discontinued     500 mg 250 mL/hr over 60 Minutes Intravenous Every 24 hours 07/08/2015 1517 06/30/2015 1521      Medications: Scheduled Meds: . antiseptic oral rinse  7 mL Mouth Rinse q12n4p  . cefTAZidime (FORTAZ)  IV  2 g Intravenous 3 times per day  . chlorhexidine  15 mL Mouth Rinse BID  . furosemide  40 mg Intravenous BID  . hydrocortisone sod succinate (SOLU-CORTEF) inj  20 mg Intravenous QPM  . hydrocortisone sod succinate (SOLU-CORTEF) inj  40 mg  Intravenous QAC breakfast  . insulin aspart  0-9 Units Subcutaneous 4 times per day  . pantoprazole (PROTONIX) IV  40 mg Intravenous Q24H  . pneumococcal 23 valent vaccine  0.5 mL Intramuscular Tomorrow-1000  . sodium chloride  1,000 mL Intravenous Once  . sodium chloride flush  3 mL Intravenous Q12H   Continuous Infusions: . Marland KitchenTPN (CLINIMIX-E) Adult Stopped (07/12/15 0900)   And  . fat emulsion Stopped (07/12/15 0900)   PRN Meds:.HYDROmorphone (DILAUDID) injection, LORazepam, promethazine, sodium chloride flush    Objective: Weight change: 3 lb 1.4 oz (1.4 kg)  Intake/Output Summary (Last 24 hours) at 07/12/15 1315 Last data filed at 07/12/15 1000  Gross per 24 hour  Intake   2419 ml  Output   1400 ml  Net   1019 ml   Blood pressure 127/109, pulse 112, temperature 97.8 F (36.6 C), temperature source Oral, resp. rate 21, height 5\' 10"  (1.778 m), weight 169 lb 12.1 oz (77 kg), SpO2 99 %. Temp:  [97.8 F (36.6 C)-98.6 F (37 C)] 97.8 F (36.6 C) (02/24 1100) Pulse Rate:  [79-123] 112 (02/24 1210) Resp:  [19-34] 21 (02/24 1210) BP: (91-149)/(49-117) 127/109 mmHg (02/24 1210)  SpO2:  [85 %-100 %] 99 % (02/24 1210) FiO2 (%):  [60 %-100 %] 80 % (02/24 1210) Weight:  [169 lb 12.1 oz (77 kg)] 169 lb 12.1 oz (77 kg) (02/24 0449)  Physical Exam: General: alert on BIPAP  HEENT: anicteric sclera,  EOMI, deaf, COLOR MUCH BETTER CVS tachy rate, normal r,  no murmur rubs or gallops Chest: on BIPAP Abdomen: soft  Less tender Extremities:his surgical site is clean Skin: echymoses, edema diffusely, PICC site dressed Neuro: nonfocal  CBC: CBC Latest Ref Rng 07/12/2015 07/11/2015 07/10/2015  WBC 4.0 - 10.5 K/uL 26.7(H) 35.1(H) 33.5(H)  Hemoglobin 13.0 - 17.0 g/dL 10.0(L) 11.1(L) 11.4(L)  Hematocrit 39.0 - 52.0 % 30.0(L) 33.1(L) 34.1(L)  Platelets 150 - 400 K/uL 21(LL) 22(LL) 23(LL)      BMET  Recent Labs  07/11/15 0627 07/12/15 0844  NA 141 143  K 4.1 3.8  CL 108 107  CO2  27 30  GLUCOSE 135* 140*  BUN 26* 35*  CREATININE 1.54* 1.25*  CALCIUM 7.8* 8.0*     Liver Panel   Recent Labs  07/10/15 1100 07/11/15 0627  PROT 3.5* 3.7*  ALBUMIN 1.4* 1.3*  AST 79* 62*  ALT 54 48  ALKPHOS 218* 200*  BILITOT 2.2* 1.6*  BILIDIR 1.2*  --   IBILI 1.0*  --        Sedimentation Rate No results for input(s): ESRSEDRATE in the last 72 hours. C-Reactive Protein No results for input(s): CRP in the last 72 hours.  Micro Results: Recent Results (from the past 720 hour(s))  Culture, blood (Routine X 2) w Reflex to ID Panel     Status: None   Collection Time: 07/09/15  1:30 PM  Result Value Ref Range Status   Specimen Description BLOOD RIGHT HAND  Final   Special Requests IN PEDIATRIC BOTTLE 1CC  Final   Culture  Setup Time   Final    GRAM NEGATIVE RODS AEROBIC BOTTLE ONLY CRITICAL RESULT CALLED TO, READ BACK BY AND VERIFIED WITH: F FLYNT,RN @0713  07/10/15 MKELLY    Culture SERRATIA MARCESCENS  Final   Report Status 07/12/2015 FINAL  Final   Organism ID, Bacteria SERRATIA MARCESCENS  Final      Susceptibility   Serratia marcescens - MIC*    CEFAZOLIN >=64 RESISTANT Resistant     CEFEPIME <=1 SENSITIVE Sensitive     CEFTAZIDIME <=1 SENSITIVE Sensitive     CEFTRIAXONE <=1 SENSITIVE Sensitive     CIPROFLOXACIN <=0.25 SENSITIVE Sensitive     GENTAMICIN <=1 SENSITIVE Sensitive     TRIMETH/SULFA <=20 SENSITIVE Sensitive     * SERRATIA MARCESCENS  Culture, blood (Routine X 2) w Reflex to ID Panel     Status: None   Collection Time: 07/09/15  1:40 PM  Result Value Ref Range Status   Specimen Description BLOOD RIGHT ARM  Final   Special Requests IN PEDIATRIC BOTTLE 2CC  Final   Culture  Setup Time   Final    GRAM NEGATIVE RODS AEROBIC BOTTLE ONLY CRITICAL RESULT CALLED TO, READ BACK BY AND VERIFIED WITH: F FLYNT,RN @0713  07/10/15 MKELLY    Culture   Final    SERRATIA MARCESCENS SUSCEPTIBILITIES PERFORMED ON PREVIOUS CULTURE WITHIN THE LAST 5 DAYS.      Report Status 07/12/2015 FINAL  Final  MRSA PCR Screening     Status: None   Collection Time: 07/09/15  2:02 PM  Result Value Ref Range Status   MRSA by PCR NEGATIVE NEGATIVE Final  Comment:        The GeneXpert MRSA Assay (FDA approved for NASAL specimens only), is one component of a comprehensive MRSA colonization surveillance program. It is not intended to diagnose MRSA infection nor to guide or monitor treatment for MRSA infections.   Culture, blood (Routine X 2) w Reflex to ID Panel     Status: None (Preliminary result)   Collection Time: 07/10/15  1:20 PM  Result Value Ref Range Status   Specimen Description BLOOD RIGHT HAND  Final   Special Requests IN PEDIATRIC BOTTLE .5CC  Final   Culture NO GROWTH < 24 HOURS  Final   Report Status PENDING  Incomplete  Culture, blood (Routine X 2) w Reflex to ID Panel     Status: None (Preliminary result)   Collection Time: 07/10/15  1:43 PM  Result Value Ref Range Status   Specimen Description BLOOD RIGHT HAND  Final   Special Requests IN PEDIATRIC BOTTLE 3CC  Final   Culture NO GROWTH < 24 HOURS  Final   Report Status PENDING  Incomplete    Studies/Results: Dg Chest Port 1 View  07/12/2015  CLINICAL DATA:  Respiratory failure, history Crohn's disease, pancreatitis EXAM: PORTABLE CHEST 1 VIEW COMPARISON:  Portable exam 0705 hours compared 07/11/2015 FINDINGS: LEFT jugular central venous catheter tip projects over cavoatrial junction. Upper normal heart size. Diffuse BILATERAL airspace infiltrates increased since previous exam question pulmonary edema versus infection. Minimal bibasilar atelectasis. No gross pleural effusion or pneumothorax. Bones demineralized with BILATERAL glenohumeral degenerative changes. IMPRESSION: Increased BILATERAL diffuse pulmonary infiltrates which could represent pulmonary edema or infection. Minimal bibasilar atelectasis. Electronically Signed   By: Ulyses Southward M.D.   On: 07/12/2015 08:10   Dg Chest  Port 1 View  07/11/2015  CLINICAL DATA:  Shortness of breath. EXAM: PORTABLE CHEST 1 VIEW COMPARISON:  07/09/2015, 05/13/2015, 11/07/2014, 06/11/2014. CT 11/06/2014 . FINDINGS: Interim removal of right PICC line. Left IJ line in stable position. Stable cardiomegaly. Persistent but slightly improving bilateral pulmonary infiltrates/ edema. Low lung volumes with basilar atelectasis. Lucency noted over the right hemidiaphragm is stable and most likely related to diaphragm contour, prominent pleural fat, and basilar atelectasis. No acute bony abnormality. IMPRESSION: 1. Interim removal of right PICC line. Left IJ line stable position. 2. Persistent but partially clearing bilateral pulmonary edema and/or pneumonia. Persistent low lung volumes with basilar atelectasis. Electronically Signed   By: Maisie Fus  Register   On: 07/11/2015 07:16      Assessment/Plan:  INTERVAL HISTORY:   07/09/15: patient with worsening abdominal pain, difficulty breathing transfer to ICU for NIPPV 07/10/15: Blood cultures with 2/2 GNR 07/11/15: PICC removed, CT without abscess or necroisis in pancreas 07/12/15: Blood culture showed Serratia yet again., Patient decompensated with pulmonary edema today requiring more aggressive diuresis   07/12/15 Principal Problem:   Acute pancreatitis Active Problems:   Crohn's disease (HCC)   Atypical mycobacterial infection of hand   AKI (acute kidney injury) (HCC)   PICC (peripherally inserted central catheter) in place   Chronic adrenal insufficiency (HCC)   Epigastric abdominal pain   Non-intractable cyclical vomiting with nausea   Anorexia   Abnormal loss of weight   Crohn's disease involving terminal ileum (HCC)   Crohn disease (HCC)   Elevated LFTs   Osteomyelitis of right wrist (HCC)   Osteomyelitis of left wrist (HCC)   Pyogenic arthritis of left wrist (HCC)   Malnutrition of moderate degree   Acute hypoxemic respiratory failure (HCC)   Sinus tachycardia (HCC)  Sepsis  (HCC)   Acute pulmonary edema (HCC)   Lactic acidosis   History of small bowel obstruction   ARDS (adult respiratory distress syndrome) (HCC)   Septic shock (HCC)   PICC line infection   Gram negative sepsis (HCC)    TRAETON Klein is a 65 y.o. 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 He is admitted for possible pancreatitis with an elevated lipase in the setting of nausea and vomiting. We had stopped his tigecycline yesterday in hopes that this was potentially the culprit antibiotic for causing his nausea vomiting and abdominal pain. Unfortunately his abdominal pain worsened and he developed sepsis from GNR. CT shows possible pancreatic pseudo-cyst but no necrosis and no intra-abdominal abscesses He is now growing Serratia.  #1 Gram negative sepsis with recurrent Serratia bacteremia likely due to PICC line never being removed during last admission  --Change to ceftaz edema for now --follow-up blood cultures  --when stable place peripheral IV's and get his central line out and give a "holiday from venous catheters" --would treat for 2 weeks of therapy from when "holiday" starts, if he ends up not needing any central lines we could finish off this therapy with bioavailable ciprofloxacin  #2 Nausea and vomiting with pancreatitis. Hepatitis: --going to watch the patient off his antimycobacterial drugs and hope we'll continue to improve hopefully won't need TPN for very long  #3 Bilateral infiltrates with hypoxia: agree this could be pulmonary edema from cardiogenic source, it could be aRDS and pancreatitis. It could be multi focal pneumonia.though I think more likely it is edema vs ARDS  #4 thrombocytopenia: Concern for hit exists and primary team is managing he could have superimposed infection on top of  this.  #5 Mycobacterium abscesses osteomyelitis and septic arthritis.: We are going to take a "holiday" from his anti-Mycobacterium antibiotics for now. Hopefully if he recovers from his acute illness and we can then revisit whether to rechallenge him again with these antibiotics or observe him off of them.  I spent greater than 35 miinutes with the patient including greater than 50% of time in face to face counsel of the patient and his wife regarding his  Serratia bacteremia, sepsis,  his Mycobacterium abscesses osteomyelitis, his nausea vomiting and possible pancreatitis his worsening abdominal pain worsening hypoxemia with bilateral pulmonary infiltrates thrombocytopenia and in coordination of his care withCCM.  Greatly appreciate the diligent and careful attention he has received from   Critical Care, GI and Primary team.  Dr. Orvan Falconer  Is available this weekend for questions.     LOS: 7 days   Acey Lav 07/12/2015, 1:15 PM

## 2015-07-12 NOTE — Progress Notes (Signed)
UR Completed. Francina Beery, RN, BSN.  [phone] 

## 2015-07-12 NOTE — Progress Notes (Signed)
PARENTERAL NUTRITION CONSULT NOTE - FOLLOW UP  Pharmacy Consult:  TPN Indication:  Severe pancreatitis/expected prolonged NPO status  Allergies  Allergen Reactions  . Amikacin Other (See Comments)    CAUSED DEAFNESS  . Cefoxitin Nausea Only    ABD. PAIN  . Fish Allergy Nausea And Vomiting  . Iodine Nausea Only    JOINT PAIN  . Ivp Dye [Iodinated Diagnostic Agents] Nausea Only    JOINT PAIN  . Rifampin Nausea And Vomiting and Other (See Comments)    CHILLS  . Shellfish Allergy Swelling  . Vancomycin Other (See Comments)    Red Man Syndrome - was told that it was given to fast.  . Demerol Rash    Patient Measurements: Height:  (177.8 cm) Weight: 169 lb 12.1 oz (77 kg) IBW/kg (Calculated) : 73  Weight from Dec 2016 = 69 kg  Vital Signs: Temp: 97.8 F (36.6 C) (02/24 0801) Temp Source: Axillary (02/24 0801) BP: 146/99 mmHg (02/24 0819) Pulse Rate: 106 (02/24 0819) Intake/Output from previous day: 02/23 0701 - 02/24 0700 In: 2649 [I.V.:20; IV Piggyback:300; TPN:2329] Out: 1075 [Urine:1075]  Labs:  Recent Labs  07/09/15 1234 07/09/15 1842 07/10/15 0016 07/10/15 0814 07/11/15 0627 07/12/15 0452  WBC  --   --  33.5*  --  35.1* 26.7*  HGB  --   --  11.4*  --  11.1* 10.0*  HCT  --   --  34.1*  --  33.1* 30.0*  PLT  --   --  23*  --  22* 21*  APTT 43* 95* 94* 88*  --   --   INR 2.10*  --   --   --   --   --      Recent Labs  07/09/15 1233 07/10/15 0016 07/10/15 1100 07/11/15 0627 07/12/15 0844  NA 135 138  --  141 143  K 3.9 4.6  --  4.1 3.8  CL 108 106  --  108 107  CO2 19* 21*  --  27 30  GLUCOSE 304* 123*  --  135* 140*  BUN 19 17  --  26* 35*  CREATININE 1.41* 1.55*  --  1.54* 1.25*  CALCIUM 7.3* 7.5*  --  7.8* 8.0*  MG  --  1.8  --  1.9  --   PHOS  --  5.0*  --  3.0  --   PROT 3.6*  --  3.5* 3.7*  --   ALBUMIN 1.5*  --  1.4* 1.3*  --   AST 68*  --  79* 62*  --   ALT 60  --  54 48  --   ALKPHOS 300*  --  218* 200*  --   BILITOT 1.8*  --   2.2* 1.6*  --   BILIDIR  --   --  1.2*  --   --   IBILI  --   --  1.0*  --   --    Estimated Creatinine Clearance: 63.3 mL/min (by C-G formula based on Cr of 1.25).    Recent Labs  07/11/15 1813 07/11/15 2334 07/12/15 0545  GLUCAP 127* 144* 143*     Insulin Requirements in the past 24 hours:  1 unit SSI  Assessment: 88 YOM with a history of Crohn's disease s/p ileostomy presented on 06/30/2015 with intermittent epigastric abdominal pain, cramping, and vomiting for 2 months PTA.  Consulting ID to evaluate antibiotic therapy that could contribute to patient's symptoms.  Per documentation, patient is  a poor candidate for surgical J-tube placement and while awaiting for antibiotic changes and resolution of GI symptoms for adequate PO intake, pharmacy consulted to manage TPN for nutritional support.  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  GI: hx Crohn's / GERD / pancreatitis - baseline prealbumin low at 4.3.  Ileostomy O/P not charted.  Considering Marinol to stimulate appetite.  2/22 CT showed possible pseudocyst, negative for pancreatitis.  PPI IV Endo: chronic AI, no hx DM - hypoglycemia prior to TPN initiation and while on steroid.  CBGs controlled post TPN initiation. Lytes: all WNL Renal: SCr 1.54, CrCL 51 ml/min - decent UOP 0.6 ml/kg/hr (but + 1500 mL) Pulm: transferred to ICU 2/21 d/t resp distress - on BiPAP.  CXR shows increased BL pulm infiltrates - now with flash pulm edema Cards: no hx - BP soft, tachy, troponin 0.06 AC/Heme: Xarelto PTA for hx LLE DVT dx 02/2015, completed treatment course >> Angiomax started 2/21 for r/o HIT.  Mild anemia, plts decreased to 21K Hepatobil: ALT normalized, others elevated.  Tbili improved to 1.6.  Lipase / TG WNL. Neuro: anxiety / hearing loss - possible cochlear implant ID: Merrem D#[id number] for ?GNR bacteremia,  azith/clofazimine/tigecycline from PTA for hx M.abscessus osteo held - afebrile, WBC 26.7 (steroid dose increased for CT since allergic to contrast 2/22)  Azith PTA >> 2/21 Clofazimine PTA >> 2/21 Tygacil PTA >> 2/20 (?caused pancreatitis) Vanc 2/21 >> 2/22 Merrem 2/21 >>  Best Practices: Angiomax TPN Access: PICC placed 10/19/14 (for long-term abx administration) TPN start date: 2/20 >>  Current Nutrition:  Clear liquid diet TPN  Nutritional Goals:  2100-2300 kCal and 115-130 grams of protein per day  Plan:  - Stop TPN this am and no bag for 2/24 per Dr Vassie Loll - F/U plans to restart TPN on 2/25  Isaac Bliss, PharmD, BCPS, [facility] Clinical Pharmacist Pager [phone] 07/12/2015 9:38 AM

## 2015-07-12 NOTE — Progress Notes (Signed)
VASCULAR LAB PRELIMINARY  PRELIMINARY  PRELIMINARY  PRELIMINARY  Bilateral lower extremity venous duplex completed.    Preliminary report:  Bilateral:  No evidence of DVT, superficial thrombosis, or Baker's Cyst.    Jenetta Loges, RVT, RDMS 07/12/2015, 4:38 PM

## 2015-07-12 NOTE — Progress Notes (Signed)
Pharmacy Antibiotic Note  65 yo m with extensive past medical history. 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 On admission, he was found with pancreatitis, so tigecycline was stopped and clofazimine and azithromycin were continued for 2 days and stopped.   He now has recurrent Serratia bacteremia (R to ancef only).  He has been on meropenem but pharmacy is consulted today to transition to ceftazidime. Wbc 35.1>26.7, afebrile, SCr 1.54>1.25, CrCl ~ 63 ml/min.   Plan: - D/c meropenem - Start ceftazidime 2 gm IV q8h - F/u repeat bld cx - F/u LOT, transition to oral, SCr, clinical course  Height:  (177.8 cm) Weight: 169 lb 12.1 oz (77 kg) IBW/kg (Calculated) : 73  Temp (24hrs), Avg:98.3 F (36.8 C), Min:97.8 F (36.6 C), Max:98.6 F (37 C)   Recent Labs Lab 06/26/2015 1638 07/06/15 0010  07/09/15 0440 07/09/15 1233 07/09/15 1235 07/10/15 0016 07/10/15 1308 07/11/15 0627 07/11/15 0638 07/12/15 0452 07/12/15 0844  WBC  --   --   < > 19.9* 3.8*  --  33.5*  --  35.1*  --  26.7*  --   CREATININE  --   --   < > 0.98 1.41*  --  1.55*  --  1.54*  --   --  1.25*  LATICACIDVEN 3.58* 1.7  --   --   --  6.0*  --  3.6*  --  2.8*  --   --   < > = values in this interval not displayed.  Estimated Creatinine Clearance: 63.3 mL/min (by C-G formula based on Cr of 1.25).    Allergies  Allergen Reactions  . Amikacin Other (See Comments)    CAUSED DEAFNESS  . Cefoxitin Nausea Only    ABD. PAIN  . Fish Allergy Nausea And Vomiting  . Iodine Nausea Only    JOINT PAIN  . Ivp Dye [Iodinated Diagnostic Agents] Nausea Only    JOINT PAIN  . Rifampin Nausea And Vomiting and Other (See Comments)    CHILLS  . Shellfish Allergy Swelling  . Vancomycin Other (See Comments)    Red Man Syndrome - was told that it was given to fast.   . Demerol Rash    Antimicrobials this admission: Clofazimine and azith stopped 2/21 Vanc 2/21 >> 2/22 Meropenem 2/21 >> 2/24 Ceftazidime 2/24 >>  Dose adjustments this admission: None  Microbiology results: 2/21 UA clean 2/21 BCx: Serratia (R to ancef) 2/21 MRSA PCR neg 2/22 BCx: ngtd  Thank you for allowing pharmacy to be a part of this patient's care.  Cassie L. Roseanne Reno, PharmD PGY2 Infectious Diseases Pharmacy Resident Pager: [phone] 07/12/2015 11:14 AM

## 2015-07-12 NOTE — Care Management Note (Signed)
Case Management Note  Patient Details  Name: Lee Klein MRN: [id number] Date of Birth: 1951-02-09  Subjective/Objective:      Pt transferred to ICU for respiratory distress requiring BIPAP     Action/Plan:    07/12/2015 Pt remains on BIPAP - unable to wean, pt is on; TNA, IV lasix, IV solumedrol, IV antibiotics for serretia bacteremia.  CM will continue to monitor for disposition needs  07/09/15 Pt discharged home in December 2016 with [facility] for antibiotics (IV Tygacil).  Pt is from home at wife [name] (RN), pt also has home O2 with [facility].  CM contacted agency and informed of admit.  Expected Discharge Date:                  Expected Discharge Plan:  Home w Home Health Services  In-House Referral:     Discharge planning Services  CM Consult  Post Acute Care Choice:  Resumption of Svcs/PTA Provider Choice offered to:  Patient  DME Arranged:    DME Agency:     HH Arranged:  RN HH Agency:  [facility]  Status of Service:  In process, will continue to follow  Medicare Important Message Given:    Date Medicare IM Given:    Medicare IM give by:    Date Additional Medicare IM Given:    Additional Medicare Important Message give by:     If discussed at Long Length of Stay Meetings, dates discussed:    Additional Comments:  Cherylann Parr, RN 07/12/2015, 1:36 PM

## 2015-07-12 NOTE — Progress Notes (Deleted)
Attempted to call report at this time 

## 2015-07-12 NOTE — Progress Notes (Addendum)
eLink Physician-Brief Progress Note Patient Name: Lee Klein DOB: Apr 13, 1951 MRN: [id number]   Date of Service  07/12/2015  HPI/Events of Note  Pt's BS was 60mg % this pm >> got 1 amp D50 >> rpt was 90mg %. Not on TPN 2 to fluid.   eICU Interventions  Will switch to D10 at 20 mls/hr.check FS q2-3 until stable.      Intervention Category Intermediate Interventions: Electrolyte abnormality - evaluation and management  Louann Sjogren 07/12/2015, 8:43 PM

## 2015-07-12 NOTE — Progress Notes (Signed)
Pt placed back on Bipap due to desat & increased WOB.

## 2015-07-12 NOTE — Progress Notes (Signed)
Rt attempted to obtain radial ABG but was unsuccessful. Per Dr Vassie Loll, ok to cancel ABG order.

## 2015-07-13 ENCOUNTER — Inpatient Hospital Stay (HOSPITAL_COMMUNITY): Payer: Managed Care, Other (non HMO)

## 2015-07-13 LAB — RENAL FUNCTION PANEL
ALBUMIN: 1.1 g/dL — AB (ref 3.5–5.0)
Anion gap: 8 (ref 5–15)
BUN: 35 mg/dL — AB (ref 6–20)
CALCIUM: 7.8 mg/dL — AB (ref 8.9–10.3)
CO2: 32 mmol/L (ref 22–32)
CREATININE: 1.26 mg/dL — AB (ref 0.61–1.24)
Chloride: 106 mmol/L (ref 101–111)
GFR calc Af Amer: >60 mL/min (ref >60)
GFR calc non Af Amer: 59 mL/min — ABNORMAL LOW (ref >60)
GLUCOSE: 139 mg/dL — AB (ref 65–99)
PHOSPHORUS: 3 mg/dL (ref 2.5–4.6)
Potassium: 3.4 mmol/L — ABNORMAL LOW (ref 3.5–5.1)
SODIUM: 146 mmol/L — AB (ref 135–145)

## 2015-07-13 LAB — GLUCOSE, CAPILLARY
GLUCOSE-CAPILLARY: 108 mg/dL — AB (ref 65–99)
GLUCOSE-CAPILLARY: 64 mg/dL — AB (ref 65–99)
GLUCOSE-CAPILLARY: 93 mg/dL (ref 65–99)
Glucose-Capillary: 157 mg/dL — ABNORMAL HIGH (ref 65–99)
Glucose-Capillary: 83 mg/dL (ref 65–99)
Glucose-Capillary: 85 mg/dL (ref 65–99)
Glucose-Capillary: 89 mg/dL (ref 65–99)

## 2015-07-13 LAB — CBC
HCT: 28.3 % — ABNORMAL LOW (ref 39.0–52.0)
HEMOGLOBIN: 9.5 g/dL — AB (ref 13.0–17.0)
MCH: 33.9 pg (ref 26.0–34.0)
MCHC: 33.6 g/dL (ref 30.0–36.0)
MCV: 101.1 fL — ABNORMAL HIGH (ref 78.0–100.0)
PLATELETS: 25 10*3/uL — AB (ref 150–400)
RBC: 2.8 MIL/uL — ABNORMAL LOW (ref 4.22–5.81)
RDW: 17.3 % — AB (ref 11.5–15.5)
WBC: 17.2 10*3/uL — ABNORMAL HIGH (ref 4.0–10.5)

## 2015-07-13 MED ORDER — POTASSIUM CHLORIDE 10 MEQ/50ML IV SOLN
10.0000 meq | INTRAVENOUS | Status: AC
  Administered 2015-07-13 (×4): 10 meq via INTRAVENOUS
  Filled 2015-07-13: qty 50

## 2015-07-13 MED ORDER — DEXTROSE 50 % IV SOLN: 25.0000 mL | Freq: Once | INTRAVENOUS | Status: AC

## 2015-07-13 MED ORDER — DEXTROSE 5 % IV SOLN
2.0000 g | INTRAVENOUS | Status: DC
  Administered 2015-07-13 – 2015-07-23 (×10): 2 g via INTRAVENOUS
  Filled 2015-07-13 (×10): qty 2

## 2015-07-13 MED ORDER — DEXTROSE 50 % IV SOLN
INTRAVENOUS | Status: AC
  Administered 2015-07-13: 25 mL
  Filled 2015-07-13: qty 50

## 2015-07-13 NOTE — Progress Notes (Signed)
Pt placed back on Bipap due to desat mid 80's. Pt's sat recovered to 98%.

## 2015-07-13 NOTE — Progress Notes (Signed)
Pt placed back on NRB due to increased WOB.

## 2015-07-13 NOTE — Progress Notes (Signed)
Pt placed on a HFNC 15 Lpm with humidity. Sat 94%.

## 2015-07-13 NOTE — Progress Notes (Signed)
Remaining TPN discarded per Pharmacy.

## 2015-07-13 NOTE — Progress Notes (Signed)
Patient ID: Lee Klein, male   DOB: 08-24-1950, 65 y.o.   MRN: [id number]         [facility]     Date of Admission:  07/06/2015           Day 5 Serratia therapy  He is resting quietly in bed. He is somewhat lethargic and still has some respiratory distress. The Serratia isolated from his blood cultures on 07/09/2015 MRSA susceptible to ceftriaxone. I will narrow antibiotic therapy now. Repeat blood cultures are negative and he is afebrile with declining white blood cell count. We will follow-up on Monday, 07/15/2015.         Cliffton Asters, MD [facility] for Infectious Disease [facility] [phone] pager   [phone] cell 05/21/2015, 1:32 PM

## 2015-07-13 NOTE — Progress Notes (Signed)
PULMONARY / CRITICAL CARE MEDICINE   Name: Lee Klein MRN: [id number] DOB: 24-Sep-1950    ADMISSION DATE:  07/02/2015 CONSULTATION DATE:  07/09/15  REFERRING MD:  Dr. Oswaldo Done /  Internal Medicine  CHIEF COMPLAINT:  Abdominal pain and vomiting   BRIEF PATIENT DESCRIPTION : The patient was admitted for acute on chronic nausea and vomiting. He has Crohn's disease. Also with significant comorbidities. 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 He was treated as drug-induced pancreatitis. GI and ID have been following. He had lactic acidosis. Had fluid resuscitation. Had resp distress on 2/21. Chest x-ray with bilateral infiltrates, new from admission. He went into respiratory distress. O2 sats 60% on non breather mask. Transferred to ICU.  ABG prior to transfer was 7.26, PCO2 33, PO2 48 on 100% nonrebreather mask. He received 80 mg IV Lasix. Patient is 12 L positive since admission.  SIGNIFICANT EVENTS:  06/27/2015 admitted for nausea vomiting, possible drug-induced pancreatitis 07/09/15 went into resp distress: O2 sats 60% on NRBM; transferred to ICU  07/12/15 marked fluid overload unable to wean off bipap  STUDIES : [name] Abd (2/17) >> Heterogeneous echogenicity of pancreas which could due pancreatic inflammation; small ascites; fatty liver 2D echo (05/2014) >> EF 50%, CHFpEF CT Abd/pelvis - negative for pancreatic necrosis or abscess or complication related to Crohn's disease. New moderate bilateral pleural effusions. New moderate volume of abdominal ascites. 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   IMAGING : CXR (2/17) >> no active dse CXR (2/21) >> acute B infiltrates  CULTURES: Blood culture (2/21) >> GNR, Serratia BCx (2/22) >> NGTD  ANTIBIOTICS :  Clofazimine 2/17 >> 2/21 Azithromycin 2/17 >> 2/21 Tigecycline 2/17 -2/20 Vanc 2/21>> 2/22 Meropenem 2/21>>2/24 Ceftazidine 2/24>>  LINES:  R PICC --since 03/2015 at least (wife does  not remember) [facility] 2/21>> Right ileostomy   SUBJECTIVE:  Hypoglycemia this morning; TPN stopped yesterday due to fluid overload. Afebrile Good UOP (-1.8L); neg balance  VITAL SIGNS: BP 152/81 mmHg  Pulse 89  Temp(Src) 97.4 F (36.3 C) (Axillary)  Resp 23  Ht  (1.778 m)  Wt 164 lb 0.4 oz (74.4 kg)  BMI 23.53 kg/m2  SpO2 97%  HEMODYNAMICS: CVP:  [1 mmHg-5 mmHg] 1 mmHg  VENTILATOR SETTINGS: Vent Mode:  [-]  FiO2 (%):  [60 %-100 %] 60 %on Bipap 12/6  80% FiO2.   INTAKE / OUTPUT: I/O last 3 completed shifts: In: 2212.3 [I.V.:222.3; IV Piggyback:450] Out: 3290 [Urine:3230; Stool:60]   PHYSICAL EXAMINATION:  General:  Wearing BiPAP. NAD, more alert Neuro:  Non-focal. Moves extremities equally. Deaf. Follows commands. Awake and alert HEENT:  BiPAP mask on face. NCAT Cardiovascular:  RRR. Good S1 and S2. No murmur rub or gallop.   Lungs: Normal WOB. Comfortable on the BiPAP. Diffuse rhonchi bilaterally Abdomen:  Soft. + bowel sounds. No masses or tenderness. (+) Ostomy in R abd.  Musculoskeletal: +3 bilateral edema. Patient with chronic left hand osteomyelitis. Skin:  Warm and dry. No rash.  LABS:  BMET  Recent Labs Lab 07/11/15 0627 07/12/15 0844 07/13/15 0430  NA 141 143 146*  K 4.1 3.8 3.4*  CL 108 107 106  CO2 27 30 32  BUN 26* 35* 35*  CREATININE 1.54* 1.25* 1.26*  GLUCOSE 135* 140* 139*    Electrolytes  Recent Labs Lab 07/09/15 0440  07/10/15 0016 07/11/15 0627 07/12/15 0844 07/13/15 0430  CALCIUM 7.4*  < > 7.5* 7.8* 8.0* 7.8*  MG 2.0  --  1.8 1.9  --   --   PHOS 2.5  --  5.0* 3.0  --  3.0  < > = values in this interval not displayed.  CBC  Recent Labs Lab 07/11/15 0627 07/12/15 0452 07/13/15 0430  WBC 35.1* 26.7* 17.2*  HGB 11.1* 10.0* 9.5*  HCT 33.1* 30.0* 28.3*  PLT 22* 21* 25*    Coag's  Recent Labs Lab 07/09/15 1234 07/09/15 1842 07/10/15 0016 07/10/15 0814  APTT 43* 95* 94* 88*  INR 2.10*  --   --   --      Sepsis Markers  Recent Labs Lab 07/09/15 1235 07/10/15 1308 07/11/15 0638  LATICACIDVEN 6.0* 3.6* 2.8*    ABG  Recent Labs Lab 07/09/15 1145 07/09/15 1535  PHART 7.266* 7.316*  PCO2ART 33.4* 35.0  PO2ART 47.9* 73.0*    Liver Enzymes  Recent Labs Lab 07/09/15 1233 07/10/15 1100 07/11/15 0627 07/13/15 0430  AST 68* 79* 62*  --   ALT 60 54 48  --   ALKPHOS 300* 218* 200*  --   BILITOT 1.8* 2.2* 1.6*  --   ALBUMIN 1.5* 1.4* 1.3* 1.1*    Cardiac Enzymes  Recent Labs Lab 07/09/15 1233 07/09/15 1842 07/10/15 0016  TROPONINI 0.03 0.06* 0.06*    Glucose  Recent Labs Lab 07/12/15 1943 07/12/15 2022 07/12/15 2159 07/13/15 0002 07/13/15 0354 07/13/15 0422  GLUCAP 61* 94 84 83 64* 108*    Imaging Dg Chest Port 1 View  07/13/2015  CLINICAL DATA:  Pulmonary edema EXAM: PORTABLE CHEST 1 VIEW COMPARISON:  July 12, 2015 FINDINGS: No pneumothorax. The left central line is stable. The cardiomediastinal silhouette is unchanged. Pulmonary edema is similar in the interval. No interval changes. IMPRESSION: Moderate pulmonary edema is similar in the interval. No other changes. Electronically Signed   By: Gerome Sam III M.D   On: 07/13/2015 07:46    DISCUSSION: The patient was admitted with acute on chronic nausea and vomiting. He has Crohn's disease. Also with significant comorbidities. He has chronic left hand osteomyelitis (Mycobacteriun abscessus)  for which he is on chronic antibiotics. Since admission, he has been  treated as most likely drug-induced pancreatitis (Tigecycline).  On 2/21, pt had worsening resp status. Chest x-ray with bilateral infiltrates, new from admission. He went into respiratory distress. O2 sats 60% on non breather mask. Transferred to ICU.  ABG prior to transfer was 7.26, PCO2 33, PO2 48 on 100% nonrebreather mask. He received 80 mg IV Lasix. Patient is  12 L positive since admission.  ASSESSMENT / PLAN:  PULMONARY A: Acute  hypoxemic respiratory failure secondary: Pulmonary edema, volume overload Acutely worsening pulmonary edema  P:   Currently on BiPAP; able to wean to NRB or HFNC Failed wean off Bipap yesterday due to fluid overload causing respiratory distress monitor for decompensation Continue Lasix 80mg  BID  CARDIOVASCULAR A:  Sinus tachycardia from underlying pulmonary edema  Patient with diastolic dysfunction based on 1610 echo, EF 50%. Not known to have CAD.  P:  Monitor on telemtry Serial troponins stable  RENAL A:   Acute kidney injury. - stable Metabolic acidosis. resolved Volume overload. P:   Lasix 80mg  BID Replace electrolytes as indicated KVO Monitor UOP; goal neg balance  GASTROINTESTINAL A:  Crohn's disease requiring steroids. Drug-induced pancreatitis (Tigecycline)  - Lipase improved Chronic nausea, vomiting. History of antibiotic related diarrhea. Severe malnutrition P:   Hold TPN in setting of pos balance and worsening pulmonary edema Continue steroids, daily Clear liquid diet if  on NRB or HFNC; check back this PM  HEMATOLOGIC A:   Thrombocytopenia, acute and chronic. Baseline was in the 140s. Possible drug induced. HIT neg. - Improving H/O left lower extremity DVT in October 2016. By history, unprovoked. He was on Xarelto outpatient. 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 Abx for 1 yr. Lactic acidosis - downtrending PICC likely source of bacteremia, grew Serratia; PICC removed P:   Abx as above; ceftazidime ID following - drug holiday for M. abscessus Serratia in 2/2 blood cultures  (2/21), repeat on 2/22 (NGTD) Will need at least 2 weeks antibiotics  ENDOCRINE A:  Chronic adrenal insufficiency.  P:   Continue IV steroids Monitor sugars SSI  NEUROLOGIC A:  Anxiety, chronic  P:   RASS goal: 0 Judicious use of benzos.  Ativan .5-1 mg prn for anxiety Restart home PO medications when taking PO (off Bipap)  FAMILY  - Updates: Updated wife at bedside.   Caryl Ada, DO 07/13/2015, 7:56 AM PGY-2, [facility]

## 2015-07-13 NOTE — Progress Notes (Signed)
PARENTERAL NUTRITION CONSULT NOTE - FOLLOW UP  Pharmacy Consult:  TPN Indication:  Severe pancreatitis/expected prolonged NPO status  Allergies  Allergen Reactions  . Amikacin Other (See Comments)    CAUSED DEAFNESS  . Cefoxitin Nausea Only    ABD. PAIN  . Fish Allergy Nausea And Vomiting  . Iodine Nausea Only    JOINT PAIN  . Ivp Dye [Iodinated Diagnostic Agents] Nausea Only    JOINT PAIN  . Rifampin Nausea And Vomiting and Other (See Comments)    CHILLS  . Shellfish Allergy Swelling  . Vancomycin Other (See Comments)    Red Man Syndrome - was told that it was given to fast.  . Demerol Rash    Patient Measurements: Height: 5\' 10"  (177.8 cm) Weight: 164 lb 0.4 oz (74.4 kg) IBW/kg (Calculated) : 73  Weight from Dec 2016 = 69 kg  Vital Signs: Temp: 98 F (36.7 C) (02/25 1203) Temp Source: Oral (02/25 1203) BP: 152/81 mmHg (02/25 0736) Pulse Rate: 89 (02/25 0736) Intake/Output from previous day: 02/24 0701 - 02/25 0700 In: 592.3 [I.V.:222.3; IV Piggyback:150; TPN:220] Out: 2490 [Urine:2430; Stool:60]  Labs:  Recent Labs  07/11/15 0627 07/12/15 0452 07/13/15 0430  WBC 35.1* 26.7* 17.2*  HGB 11.1* 10.0* 9.5*  HCT 33.1* 30.0* 28.3*  PLT 22* 21* 25*     Recent Labs  07/11/15 0627 07/12/15 0844 07/13/15 0430  NA 141 143 146*  K 4.1 3.8 3.4*  CL 108 107 106  CO2 27 30 32  GLUCOSE 135* 140* 139*  BUN 26* 35* 35*  CREATININE 1.54* 1.25* 1.26*  CALCIUM 7.8* 8.0* 7.8*  MG 1.9  --   --   PHOS 3.0  --  3.0  PROT 3.7*  --   --   ALBUMIN 1.3*  --  1.1*  AST 62*  --   --   ALT 48  --   --   ALKPHOS 200*  --   --   BILITOT 1.6*  --   --    Estimated Creatinine Clearance: 62.8 mL/min (by C-G formula based on Cr of 1.26).    Recent Labs  07/13/15 0354 07/13/15 0422 07/13/15 0750  GLUCAP 64* 108* 93     Insulin Requirements in the past 24 hours:  None   Assessment: 59 YOM with a history of Crohn's disease s/p ileostomy presented on 07/19/2015 with  intermittent epigastric abdominal pain, cramping, and vomiting for 2 months PTA.  Consulting ID to evaluate antibiotic therapy that could contribute to patient's symptoms.  Per documentation, patient is a poor candidate for surgical J-tube placement and while awaiting for antibiotic changes and resolution of GI symptoms for adequate PO intake, pharmacy consulted to manage TPN for nutritional support.  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  GI: hx Crohn's / GERD / pancreatitis - baseline prealbumin low at 4.3.  Ileostomy O/P not charted.  Considering Marinol to stimulate appetite.  2/22 CT showed possible pseudocyst, negative for pancreatitis.  PPI IV Endo: chronic AI, no hx DM - hypoglycemia prior to TPN initiation and while on steroid.  CBGs controlled post TPN initiation. Lytes: all WNL Renal: SCr 1.26, CrCL 62 ml/min - UOP improved to 1.4 ml/kg/hr; I/O ~ -1.8 today, K 3.4 (supplemented x 4 runs) Pulm: transferred to ICU 2/21 d/t resp distress - on HFNC.  CXR shows increased BL pulm  infiltrates - now with flash pulm edema Cards: no hx - BP soft, tachy, troponin 0.06 AC/Heme: Xarelto PTA for hx LLE DVT dx 02/2015, completed treatment course.  Mild anemia, plts decreased to 21K. HIT neg Hepatobil: ALT normalized, others elevated.  Tbili improved to 1.6.  Lipase / TG WNL. Neuro: anxiety / hearing loss - possible cochlear implant ID: Merrem D#5 for ?GNR bacteremia, azith/clofazimine/tigecycline from PTA for hx M.abscessus osteo held - afebrile, WBC 26.7>>17.2 (steroid dose increased for CT since allergic to contrast 2/22)  Azith PTA >> 2/21 Clofazimine PTA >> 2/21 Tygacil PTA >> 2/20 (?caused pancreatitis) Vanc 2/21 >> 2/22 Merrem 2/21 >>  Best Practices: Restart SQ hep when Plt > 50K per MD  TPN Access: PICC placed 10/19/14 (for long-term abx administration) TPN start date: 2/20  >>  Current Nutrition:  Clear liquid diet   Nutritional Goals:  2100-2300 kCal and 115-130 grams of protein per day  Plan:  - Hold TPN today to volume status improved  - F/U plans to restart TPN on 2/26  Vinnie Level, PharmD., BCPS Clinical Pharmacist Pager [phone]

## 2015-07-14 ENCOUNTER — Inpatient Hospital Stay (HOSPITAL_COMMUNITY): Payer: Managed Care, Other (non HMO)

## 2015-07-14 DIAGNOSIS — R0600 Dyspnea, unspecified: Secondary | ICD-10-CM

## 2015-07-14 LAB — GLUCOSE, CAPILLARY
GLUCOSE-CAPILLARY: 105 mg/dL — AB (ref 65–99)
GLUCOSE-CAPILLARY: 94 mg/dL (ref 65–99)
GLUCOSE-CAPILLARY: 125 mg/dL — AB (ref 65–99)
GLUCOSE-CAPILLARY: 111 mg/dL — AB (ref 65–99)
Glucose-Capillary: 96 mg/dL (ref 65–99)
Glucose-Capillary: 141 mg/dL — ABNORMAL HIGH (ref 65–99)

## 2015-07-14 LAB — CBC
HCT: 30.3 % — ABNORMAL LOW (ref 39.0–52.0)
Hemoglobin: 9.6 g/dL — ABNORMAL LOW (ref 13.0–17.0)
MCH: 30.9 pg (ref 26.0–34.0)
MCHC: 31.7 g/dL (ref 30.0–36.0)
MCV: 97.4 fL (ref 78.0–100.0)
PLATELETS: 43 10*3/uL — AB (ref 150–400)
RBC: 3.11 MIL/uL — AB (ref 4.22–5.81)
RDW: 17.3 % — ABNORMAL HIGH (ref 11.5–15.5)
WBC: 17.6 10*3/uL — AB (ref 4.0–10.5)

## 2015-07-14 LAB — RENAL FUNCTION PANEL
Albumin: 1.2 g/dL — ABNORMAL LOW (ref 3.5–5.0)
Anion gap: 7 (ref 5–15)
BUN: 31 mg/dL — ABNORMAL HIGH (ref 6–20)
CHLORIDE: 105 mmol/L (ref 101–111)
CO2: 35 mmol/L — ABNORMAL HIGH (ref 22–32)
CREATININE: 1.3 mg/dL — AB (ref 0.61–1.24)
Calcium: 7.6 mg/dL — ABNORMAL LOW (ref 8.9–10.3)
GFR, EST NON AFRICAN AMERICAN: 57 mL/min — AB (ref >60)
Glucose, Bld: 113 mg/dL — ABNORMAL HIGH (ref 65–99)
POTASSIUM: 3 mmol/L — AB (ref 3.5–5.1)
Phosphorus: 2.7 mg/dL (ref 2.5–4.6)
Sodium: 147 mmol/L — ABNORMAL HIGH (ref 135–145)

## 2015-07-14 MED ORDER — FAT EMULSION 20 % IV EMUL
250.0000 mL | INTRAVENOUS | Status: DC
  Administered 2015-07-14: 250 mL via INTRAVENOUS
  Filled 2015-07-14: qty 250

## 2015-07-14 MED ORDER — TRACE MINERALS CR-CU-MN-SE-ZN 10-1000-500-60 MCG/ML IV SOLN
INTRAVENOUS | Status: DC
  Administered 2015-07-14: 17:00:00 via INTRAVENOUS
  Filled 2015-07-14: qty 960

## 2015-07-14 MED ORDER — POTASSIUM CHLORIDE 10 MEQ/50ML IV SOLN
10.0000 meq | INTRAVENOUS | Status: AC
  Administered 2015-07-14 (×4): 10 meq via INTRAVENOUS
  Filled 2015-07-14 (×4): qty 50

## 2015-07-14 NOTE — Progress Notes (Signed)
Pt desaturated into 80's on 40% Fi02, Sp02 climbed to 96% on 60% Fi02 per RRT.

## 2015-07-14 NOTE — Progress Notes (Signed)
PULMONARY / CRITICAL CARE MEDICINE   Name: Lee Klein MRN: [id number] DOB: [birth year]    ADMISSION DATE:  06/22/2015 CONSULTATION DATE:  07/09/15  REFERRING MD:  Dr. Oswaldo Done /  Internal Medicine  CHIEF COMPLAINT:  Abdominal pain and vomiting   BRIEF PATIENT DESCRIPTION : The patient was admitted for acute on chronic nausea and vomiting. He has Crohn's disease. Also with significant comorbidities. 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 He was treated as drug-induced pancreatitis. GI and ID have been following. He had lactic acidosis. Had fluid resuscitation. Had resp distress on 2/21. Chest x-ray with bilateral infiltrates, new from admission. He went into respiratory distress. O2 sats 60% on non breather mask. Transferred to ICU.  ABG prior to transfer was 7.26, PCO2 33, PO2 48 on 100% nonrebreather mask. He received 80 mg IV Lasix. Patient is 12 L positive since admission.  SIGNIFICANT EVENTS:  07/04/2015 admitted for nausea vomiting, possible drug-induced pancreatitis 07/09/15 went into resp distress: O2 sats 60% on NRBM; transferred to ICU  07/12/15 marked fluid overload unable to wean off bipap  STUDIES : [name] Abd (2/17) >> Heterogeneous echogenicity of pancreas which could due pancreatic inflammation; small ascites; fatty liver 2D echo (05/2014) >> EF 50%, CHFpEF CT Abd/pelvis - negative for pancreatic necrosis or abscess or complication related to Crohn's disease. New moderate bilateral pleural effusions. New moderate volume of abdominal ascites. 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   IMAGING : CXR (2/17) >> no active dse CXR (2/21) >> acute B infiltrates CXR 2/26 >> Improving B/L infiltrates.   CULTURES: Blood culture (2/21) >> GNR, Serratia BCx (2/22) >> NGTD  ANTIBIOTICS :  Clofazimine 2/17 >> 2/21 Azithromycin 2/17 >> 2/21 Tigecycline 2/17 -2/20 Vanc 2/21>> 2/22 Meropenem 2/21>>2/24 Ceftazidine 2/24>>  2/25 Ceftriaxone 2/25 >>  LINES:  R PICC --since 03/2015 at least (wife does not remember) [facility] 2/21>> Right ileostomy   SUBJECTIVE:  Feels well with no complaints. On Bipap overnight and HFNC during the day Responding to diuresis.  VITAL SIGNS: BP 107/67 mmHg  Pulse 93  Temp(Src) 97.5 F (36.4 C) (Oral)  Resp 14  Ht 5\' 10"  (1.778 m)  Wt 160 lb 7.9 oz (72.8 kg)  BMI 23.03 kg/m2  SpO2 93%  HEMODYNAMICS: CVP:  [2 mmHg-4 mmHg] 3 mmHg  VENTILATOR SETTINGS: Vent Mode:  [-]  FiO2 (%):  [40 %-100 %] 100 %on Bipap 12/6  80% FiO2.   INTAKE / OUTPUT: I/O last 3 completed shifts: In: 1032.3 [P.O.:120; I.V.:712.3; IV Piggyback:200] Out: 3530 [Urine:3170; Stool:360]   PHYSICAL EXAMINATION:  General:  Awake, alert. No distress Neuro:  No focal deficits. Awake and alert HEENT:  HFNC on face. Moist mucus membranes.  Cardiovascular:  RRR. S1, S2. No MRG  Lungs: Non labored breathing. No wheeze or crackles.  Abdomen:  Soft. + bowel sounds. Non tender. (+) Ostomy in R abd.  Musculoskeletal: +2 bilateral edema. Patient with chronic left hand osteomyelitis. Skin:  Warm and dry. No rash.  LABS:  BMET  Recent Labs Lab 07/12/15 0844 07/13/15 0430 07/14/15 0450  NA 143 146* 147*  K 3.8 3.4* 3.0*  CL 107 106 105  CO2 30 32 35*  BUN 35* 35* 31*  CREATININE 1.25* 1.26* 1.30*  GLUCOSE 140* 139* 113*    Electrolytes  Recent Labs Lab 07/09/15 0440  07/10/15 0016 07/11/15 0627 07/12/15 0844 07/13/15 0430 07/14/15 0450  CALCIUM 7.4*  < > 7.5* 7.8* 8.0* 7.8* 7.6*  MG  2.0  --  1.8 1.9  --   --   --   PHOS 2.5  --  5.0* 3.0  --  3.0 2.7  < > = values in this interval not displayed.  CBC  Recent Labs Lab 07/12/15 0452 07/13/15 0430 07/14/15 0450  WBC 26.7* 17.2* 17.6*  HGB 10.0* 9.5* 9.6*  HCT 30.0* 28.3* 30.3*  PLT 21* 25* 43*    Coag's  Recent Labs Lab 07/09/15 1234 07/09/15 1842 07/10/15 0016 07/10/15 0814  APTT 43* 95* 94* 88*  INR 2.10*  --   --    --     Sepsis Markers  Recent Labs Lab 07/09/15 1235 07/10/15 1308 07/11/15 0638  LATICACIDVEN 6.0* 3.6* 2.8*    ABG  Recent Labs Lab 07/09/15 1145 07/09/15 1535  PHART 7.266* 7.316*  PCO2ART 33.4* 35.0  PO2ART 47.9* 73.0*    Liver Enzymes  Recent Labs Lab 07/09/15 1233 07/10/15 1100 07/11/15 0627 07/13/15 0430 07/14/15 0450  AST 68* 79* 62*  --   --   ALT 60 54 48  --   --   ALKPHOS 300* 218* 200*  --   --   BILITOT 1.8* 2.2* 1.6*  --   --   ALBUMIN 1.5* 1.4* 1.3* 1.1* 1.2*    Cardiac Enzymes  Recent Labs Lab 07/09/15 1233 07/09/15 1842 07/10/15 0016  TROPONINI 0.03 0.06* 0.06*    Glucose  Recent Labs Lab 07/13/15 1204 07/13/15 1559 07/13/15 2025 07/14/15 0028 07/14/15 0427 07/14/15 0753  GLUCAP 85 157* 89 96 105* 94    Imaging Dg Chest Port 1 View  07/14/2015  CLINICAL DATA:  65 year old male with respiratory failure. EXAM: PORTABLE CHEST 1 VIEW COMPARISON:  07/13/2015 and prior exams FINDINGS: Cardiomegaly and left IJ central venous catheter with tip overlying the superior cavoatrial junction again noted. Bilateral airspace opacities have is slightly decreased. There is no evidence of pneumothorax. No other significant changes identified. IMPRESSION: Slightly decreased bilateral airspace opacities/ edema. Otherwise unchanged appearance chest. Electronically Signed   By: Harmon Pier M.D.   On: 07/14/2015 07:23    DISCUSSION: The patient was admitted with acute on chronic nausea and vomiting. He has Crohn's disease. Also with significant comorbidities. He has chronic left hand osteomyelitis (Mycobacteriun abscessus)  for which he is on chronic antibiotics. Since admission, he has been  treated as most likely drug-induced pancreatitis (Tigecycline).    On 2/21, pt had worsening resp status likely from volume overload. Chest x-ray with bilateral infiltrates, new from admission. Responding to lasix.   ASSESSMENT / PLAN:  PULMONARY A: Acute  hypoxemic respiratory failure secondary: Pulmonary edema, volume overload Acutely worsening pulmonary edema >> Improving.   P:   Continue HFNC. Bipap as needed. Wean O2 Continue Lasix  BID Repeat CXR tomorrow.  CARDIOVASCULAR A:  Sinus tachycardia from underlying pulmonary edema  Patient with diastolic dysfunction based on 1610 echo, EF 50%. Not known to have CAD.  P:  Monitor on telemtry Serial troponins stable  RENAL A:   Acute kidney injury. - stable Metabolic acidosis. resolved Volume overload. P:   Lasix  BID Replace electrolytes as indicated KVO Monitor UOP; goal neg balance  GASTROINTESTINAL A:  Crohn's disease requiring steroids. Drug-induced pancreatitis (Tigecycline)  - Lipase improved Chronic nausea, vomiting. History of antibiotic related diarrhea. Severe malnutrition P:   Resume TPN as volume status is getting better. Will try to advance diet today but still not getting enough nutrition via PO. Continue steroids, daily  HEMATOLOGIC A:  Thrombocytopenia, acute and chronic. Baseline was in the 140s. Possible drug induced. HIT neg. - Improving H/O left lower extremity DVT in October 2016. By history, unprovoked. He was on Xarelto outpatient. 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 Abx for 1 yr. Lactic acidosis - downtrending PICC likely source of bacteremia, grew Serratia; PICC removed P:   Abx as above. Narrowed to ceftriaxone as per ID, ID following - drug holiday for M. abscessus Will need at least 2 weeks antibiotics  ENDOCRINE A:  Chronic adrenal  insufficiency.  P:   Continue IV steroids Monitor sugars SSI  NEUROLOGIC A:  Anxiety, chronic  P:   RASS goal: 0 Judicious use of benzos.  Ativan .5-1 mg prn for anxiety Restart home PO medications when taking PO (off Bipap)  FAMILY  - Updates: Updated wife at bedside. Critical care time - 35 mins.  Chilton Greathouse MD [facility] Pager [phone] If no answer or after 3pm call: [phone] 07/14/2015, 10:52 AM

## 2015-07-14 NOTE — Progress Notes (Signed)
[facility] ADULT ICU REPLACEMENT PROTOCOL FOR AM LAB REPLACEMENT ONLY  The patient does apply for the [facility] Adult ICU Electrolyte Replacment Protocol based on the criteria listed below:   1. Is GFR >/= 40 ml/min? Yes.    Patient's GFR today is 57 2. Is urine output >/= 0.5 ml/kg/hr for the last 6 hours? Yes.   Patient's UOP is 0.7 ml/kg/hr 3. Is BUN < 60 mg/dL? Yes.    Patient's BUN today is 31 4. Abnormal electrolyte(s):K 3.05. Ordered repletion with: per protocol 6. If a panic level lab has been reported, has the CCM MD in charge been notified? No..   Physician:    Markus Daft A 07/14/2015 6:11 AM

## 2015-07-14 NOTE — Progress Notes (Signed)
RN placed pt on HFNC 15 Lpm

## 2015-07-14 NOTE — Progress Notes (Signed)
PARENTERAL NUTRITION CONSULT NOTE - FOLLOW UP  Pharmacy Consult:  TPN Indication:  Severe pancreatitis/expected prolonged NPO status  Allergies  Allergen Reactions  . Amikacin Other (See Comments)    CAUSED DEAFNESS  . Cefoxitin Nausea Only    ABD. PAIN  . Fish Allergy Nausea And Vomiting  . Iodine Nausea Only    JOINT PAIN  . Ivp Dye [Iodinated Diagnostic Agents] Nausea Only    JOINT PAIN  . Rifampin Nausea And Vomiting and Other (See Comments)    CHILLS  . Shellfish Allergy Swelling  . Vancomycin Other (See Comments)    Red Man Syndrome - was told that it was given to fast.  . Demerol Rash    Patient Measurements: Height: 5\' 10"  (177.8 cm) Weight: 160 lb 7.9 oz (72.8 kg) IBW/kg (Calculated) : 73  Weight from Dec 2016 = 69 kg  Vital Signs: Temp: 97.5 F (36.4 C) (02/26 0749) Temp Source: Oral (02/26 0749) BP: 107/67 mmHg (02/26 1000) Pulse Rate: 93 (02/26 1000) Intake/Output from previous day: 02/25 0701 - 02/26 0700 In: 700 [P.O.:120; I.V.:480; IV Piggyback:100] Out: 2340 [Urine:2040; Stool:300]  Labs:  Recent Labs  07/12/15 0452 07/13/15 0430 07/14/15 0450  WBC 26.7* 17.2* 17.6*  HGB 10.0* 9.5* 9.6*  HCT 30.0* 28.3* 30.3*  PLT 21* 25* 43*     Recent Labs  07/12/15 0844 07/13/15 0430 07/14/15 0450  NA 143 146* 147*  K 3.8 3.4* 3.0*  CL 107 106 105  CO2 30 32 35*  GLUCOSE 140* 139* 113*  BUN 35* 35* 31*  CREATININE 1.25* 1.26* 1.30*  CALCIUM 8.0* 7.8* 7.6*  PHOS  --  3.0 2.7  ALBUMIN  --  1.1* 1.2*   Estimated Creatinine Clearance: 60.7 mL/min (by C-G formula based on Cr of 1.3).    Recent Labs  07/14/15 0028 07/14/15 0427 07/14/15 0753  GLUCAP 96 105* 94     Insulin Requirements in the past 24 hours:  2 units SSI    Assessment: 58 YOM with a history of Crohn's disease s/p ileostomy presented on 06/25/2015 with intermittent epigastric abdominal pain, cramping, and vomiting for 2 months PTA.  Consulting ID to evaluate antibiotic  therapy that could contribute to patient's symptoms.  Per documentation, patient is a poor candidate for surgical J-tube placement and while awaiting for antibiotic changes and resolution of GI symptoms for adequate PO intake, pharmacy consulted to manage TPN for nutritional support.  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 TPN was held x 2 days from 2/24-2/25 due to fluid overload but resuming on 2/26  GI: hx Crohn's / GERD / pancreatitis - Albumin low at 1.2.  Ileostomy O/P not charted.  Considering Marinol to stimulate appetite.  2/22 CT showed possible pseudocyst, negative for pancreatitis.  PPI IV. Will try to advance diet soon.  Endo: chronic AI, no hx DM - hypoglycemia prior to TPN initiation and while on steroid.  CBGs controlled 89-157, On hydrocortisone twice daily, on sensitive SSI  Lytes: K 3 (supplemented x 4 runs), Na 147, On lasix 40 mg BID, Renal: SCr 1.3, CrCL 60 ml/min - UOP improved to 1.2 ml/kg/hr; negative balance yesterday and today. Monitor fluid status closely  Pulm: transferred to ICU 2/21 d/t resp distress - on HFNC.  CXR shows increased BL pulm infiltrates - now with flash pulm edema Cards: no hx - BP  soft, tachy, troponin 0.06 AC/Heme: Xarelto PTA for hx LLE DVT dx 02/2015, completed treatment course.  Mild anemia, plts up to 43K. HIT neg Hepatobil: ALT normalized, others elevated.  Tbili improved to 1.6.  Lipase / TG WNL. Neuro: anxiety / hearing loss - possible cochlear implant ID: Ceftriaxone for serratia bacteremia with PICC as likely source, azith/clofazimine/tigecycline from PTA for hx M.abscessus osteo held - afebrile, WBC 17.6   Azith PTA >> 2/21 Clofazimine PTA >> 2/21 Tygacil PTA >> 2/20 (?caused pancreatitis) Vanc 2/21 >> 2/22 Merrem 2/21 >>2/24 Ceftazidime 2/24>>2/25 Ceftriaxone 2/25>>   Best Practices: Restart SQ hep when Plt > 50K per MD   TPN Access: PICC placed 10/19/14 (for long-term abx administration) TPN start date: 2/20 >>2/24; 2/26>>   Current Nutrition:  Clear liquid diet   Nutritional Goals:  2100-2300 kCal and 115-130 grams of protein per day  Plan:  - Resume Clinimix E 5/15 at 40 mL/hr + IVFE 20% at 10 mL/hr. This will provide 1162 Kcal and 48 gm of protein. Advance to goal as tolerated  - Daily multivitamin and trace elements - Continue sensitive SSI Q6H.  - Monitor volume status closely on TPN   Vinnie Level, PharmD., BCPS Clinical Pharmacist Pager [phone]

## 2015-07-14 NOTE — Progress Notes (Signed)
Echocardiogram 2D Echocardiogram has been performed.  Lee Klein 07/14/2015, 11:34 AM

## 2015-07-15 ENCOUNTER — Inpatient Hospital Stay (HOSPITAL_COMMUNITY): Payer: Managed Care, Other (non HMO)

## 2015-07-15 DIAGNOSIS — R918 Other nonspecific abnormal finding of lung field: Secondary | ICD-10-CM

## 2015-07-15 DIAGNOSIS — Z452 Encounter for adjustment and management of vascular access device: Secondary | ICD-10-CM | POA: Insufficient documentation

## 2015-07-15 DIAGNOSIS — A4153 Sepsis due to Serratia: Secondary | ICD-10-CM

## 2015-07-15 DIAGNOSIS — M00842 Arthritis due to other bacteria, left hand: Secondary | ICD-10-CM

## 2015-07-15 DIAGNOSIS — M868X4 Other osteomyelitis, hand: Secondary | ICD-10-CM

## 2015-07-15 DIAGNOSIS — R112 Nausea with vomiting, unspecified: Secondary | ICD-10-CM

## 2015-07-15 DIAGNOSIS — A318 Other mycobacterial infections: Secondary | ICD-10-CM

## 2015-07-15 DIAGNOSIS — K759 Inflammatory liver disease, unspecified: Secondary | ICD-10-CM

## 2015-07-15 DIAGNOSIS — K863 Pseudocyst of pancreas: Secondary | ICD-10-CM

## 2015-07-15 DIAGNOSIS — H9190 Unspecified hearing loss, unspecified ear: Secondary | ICD-10-CM

## 2015-07-15 DIAGNOSIS — R0902 Hypoxemia: Secondary | ICD-10-CM

## 2015-07-15 LAB — MAGNESIUM: MAGNESIUM: 1.6 mg/dL — AB (ref 1.7–2.4)

## 2015-07-15 LAB — RENAL FUNCTION PANEL
ANION GAP: 9 (ref 5–15)
Albumin: 1.2 g/dL — ABNORMAL LOW (ref 3.5–5.0)
BUN: 30 mg/dL — ABNORMAL HIGH (ref 6–20)
CHLORIDE: 102 mmol/L (ref 101–111)
CO2: 35 mmol/L — AB (ref 22–32)
CREATININE: 1.31 mg/dL — AB (ref 0.61–1.24)
Calcium: 7.6 mg/dL — ABNORMAL LOW (ref 8.9–10.3)
GFR calc non Af Amer: 57 mL/min — ABNORMAL LOW (ref >60)
Glucose, Bld: 125 mg/dL — ABNORMAL HIGH (ref 65–99)
POTASSIUM: 2.8 mmol/L — AB (ref 3.5–5.1)
Phosphorus: 2.5 mg/dL (ref 2.5–4.6)
Sodium: 146 mmol/L — ABNORMAL HIGH (ref 135–145)

## 2015-07-15 LAB — BASIC METABOLIC PANEL
Anion gap: 11 (ref 5–15)
BUN: 28 mg/dL — ABNORMAL HIGH (ref 6–20)
CALCIUM: 7.7 mg/dL — AB (ref 8.9–10.3)
CO2: 34 mmol/L — ABNORMAL HIGH (ref 22–32)
CREATININE: 1.34 mg/dL — AB (ref 0.61–1.24)
Chloride: 101 mmol/L (ref 101–111)
GFR, EST NON AFRICAN AMERICAN: 55 mL/min — AB (ref >60)
Glucose, Bld: 216 mg/dL — ABNORMAL HIGH (ref 65–99)
Potassium: 3.2 mmol/L — ABNORMAL LOW (ref 3.5–5.1)
SODIUM: 146 mmol/L — AB (ref 135–145)

## 2015-07-15 LAB — GLUCOSE, CAPILLARY
GLUCOSE-CAPILLARY: 115 mg/dL — AB (ref 65–99)
GLUCOSE-CAPILLARY: 113 mg/dL — AB (ref 65–99)
GLUCOSE-CAPILLARY: 110 mg/dL — AB (ref 65–99)
GLUCOSE-CAPILLARY: 126 mg/dL — AB (ref 65–99)
GLUCOSE-CAPILLARY: 88 mg/dL (ref 65–99)
Glucose-Capillary: 123 mg/dL — ABNORMAL HIGH (ref 65–99)

## 2015-07-15 LAB — CBC
HEMATOCRIT: 29.2 % — AB (ref 39.0–52.0)
Hemoglobin: 9.5 g/dL — ABNORMAL LOW (ref 13.0–17.0)
MCH: 32.2 pg (ref 26.0–34.0)
MCHC: 32.5 g/dL (ref 30.0–36.0)
MCV: 99 fL (ref 78.0–100.0)
Platelets: 52 10*3/uL — ABNORMAL LOW (ref 150–400)
RBC: 2.95 MIL/uL — ABNORMAL LOW (ref 4.22–5.81)
RDW: 16.6 % — AB (ref 11.5–15.5)
WBC: 16.3 10*3/uL — ABNORMAL HIGH (ref 4.0–10.5)

## 2015-07-15 LAB — DIFFERENTIAL
BASOS ABS: 0 10*3/uL (ref 0.0–0.1)
BASOS PCT: 0 %
EOS ABS: 0 10*3/uL (ref 0.0–0.7)
EOS PCT: 0 %
Lymphocytes Relative: 6 %
Lymphs Abs: 1 10*3/uL (ref 0.7–4.0)
MONOS PCT: 6 %
Monocytes Absolute: 0.9 10*3/uL (ref 0.1–1.0)
Neutro Abs: 14.3 10*3/uL — ABNORMAL HIGH (ref 1.7–7.7)
Neutrophils Relative %: 88 %

## 2015-07-15 LAB — CULTURE, BLOOD (ROUTINE X 2)
CULTURE: NO GROWTH
CULTURE: NO GROWTH

## 2015-07-15 LAB — TRIGLYCERIDES: Triglycerides: 80 mg/dL (ref <150)

## 2015-07-15 LAB — PREALBUMIN: Prealbumin: 6.6 mg/dL — ABNORMAL LOW (ref 18–38)

## 2015-07-15 MED ORDER — BOOST / RESOURCE BREEZE PO LIQD: 1.0000 | ORAL | Status: DC | PRN

## 2015-07-15 MED ORDER — FUROSEMIDE 10 MG/ML IJ SOLN
40.0000 mg | Freq: Once | INTRAMUSCULAR | Status: AC
  Administered 2015-07-15: 40 mg via INTRAVENOUS
  Filled 2015-07-15: qty 4

## 2015-07-15 MED ORDER — HEPARIN SODIUM (PORCINE) 5000 UNIT/ML IJ SOLN: 5000.0000 [IU] | Freq: Three times a day (TID) | INTRAMUSCULAR | Status: DC

## 2015-07-15 MED ORDER — HALOPERIDOL LACTATE 5 MG/ML IJ SOLN
1.0000 mg | INTRAMUSCULAR | Status: DC | PRN
  Administered 2015-07-15 – 2015-07-19 (×3): 4 mg via INTRAVENOUS
  Filled 2015-07-15 (×3): qty 1

## 2015-07-15 MED ORDER — SODIUM CHLORIDE 0.9% FLUSH: 10.0000 mL | INTRAVENOUS | Status: DC | PRN

## 2015-07-15 MED ORDER — FUROSEMIDE 10 MG/ML IJ SOLN
80.0000 mg | Freq: Two times a day (BID) | INTRAMUSCULAR | Status: DC
  Administered 2015-07-15 – 2015-07-17 (×4): 80 mg via INTRAVENOUS
  Filled 2015-07-15 (×5): qty 8

## 2015-07-15 MED ORDER — POTASSIUM CHLORIDE 10 MEQ/50ML IV SOLN
10.0000 meq | INTRAVENOUS | Status: AC
  Administered 2015-07-15 (×8): 10 meq via INTRAVENOUS
  Filled 2015-07-15 (×8): qty 50

## 2015-07-15 MED ORDER — INSULIN ASPART 100 UNIT/ML ~~LOC~~ SOLN
0.0000 [IU] | SUBCUTANEOUS | Status: DC
  Administered 2015-07-16: 1 [IU] via SUBCUTANEOUS
  Administered 2015-07-17: 2 [IU] via SUBCUTANEOUS
  Administered 2015-07-17 – 2015-07-24 (×15): 1 [IU] via SUBCUTANEOUS
  Administered 2015-07-25: 2 [IU] via SUBCUTANEOUS
  Administered 2015-07-25: 1 [IU] via SUBCUTANEOUS
  Administered 2015-07-25 (×3): 2 [IU] via SUBCUTANEOUS
  Administered 2015-07-26 – 2015-07-28 (×7): 1 [IU] via SUBCUTANEOUS
  Administered 2015-07-28: 2 [IU] via SUBCUTANEOUS
  Administered 2015-07-28 – 2015-07-29 (×4): 1 [IU] via SUBCUTANEOUS
  Administered 2015-07-29: 2 [IU] via SUBCUTANEOUS
  Administered 2015-07-29 – 2015-07-30 (×5): 1 [IU] via SUBCUTANEOUS

## 2015-07-15 MED ORDER — LORAZEPAM 2 MG/ML IJ SOLN
0.5000 mg | Freq: Once | INTRAMUSCULAR | Status: AC
  Administered 2015-07-15: 0.5 mg via INTRAVENOUS

## 2015-07-15 MED ORDER — METOPROLOL TARTRATE 1 MG/ML IV SOLN
5.0000 mg | Freq: Four times a day (QID) | INTRAVENOUS | Status: DC | PRN
  Administered 2015-07-15 – 2015-07-17 (×3): 5 mg via INTRAVENOUS
  Filled 2015-07-15 (×3): qty 5

## 2015-07-15 MED ORDER — BOOST / RESOURCE BREEZE PO LIQD
1.0000 | Freq: Three times a day (TID) | ORAL | Status: DC
  Administered 2015-07-15: 1 via ORAL
  Filled 2015-07-15: qty 1

## 2015-07-15 MED ORDER — MAGNESIUM SULFATE 2 GM/50ML IV SOLN
2.0000 g | Freq: Once | INTRAVENOUS | Status: AC
  Administered 2015-07-15: 2 g via INTRAVENOUS
  Filled 2015-07-15: qty 50

## 2015-07-15 NOTE — Progress Notes (Signed)
Peripherally Inserted Central Catheter/Midline Placement  The IV Nurse has discussed with the patient and/or persons authorized to consent for the patient, the purpose of this procedure and the potential benefits and risks involved with this procedure.  The benefits include less needle sticks, lab draws from the catheter and patient may be discharged home with the catheter.  Risks include, but not limited to, infection, bleeding, blood clot (thrombus formation), and puncture of an artery; nerve damage and irregular heat beat.  Alternatives to this procedure were also discussed.  Consent signed by wife due to altered mental status.  PICC/Midline Placement Documentation        Lee Klein, Lee Klein 07/15/2015, 7:18 PM

## 2015-07-15 NOTE — Progress Notes (Signed)
[facility] ADULT ICU REPLACEMENT PROTOCOL FOR AM LAB REPLACEMENT ONLY  The patient does apply for the [facility] Adult ICU Electrolyte Replacment Protocol based on the criteria listed below:   1. Is GFR >/= 40 ml/min? Yes.    Patient's GFR today is >60 2. Is urine output >/= 0.5 ml/kg/hr for the last 6 hours? Yes.   Patient's UOP is 0.6 ml/kg/hr 3. Is BUN < 60 mg/dL? Yes.    Patient's BUN today is 30 4. Abnormal electrolyte(s): K 2.8, mag 1.6 5. Ordered repletion with: per protocol 6. If a panic level lab has been reported, has the CCM MD in charge been notified? No..   Physician:    Markus Daft A 07/15/2015 6:01 AM

## 2015-07-15 NOTE — Progress Notes (Signed)
PARENTERAL NUTRITION CONSULT NOTE - FOLLOW UP  Pharmacy Consult:  TPN Indication:  Severe pancreatitis/expected prolonged NPO status  Allergies  Allergen Reactions  . Amikacin Other (See Comments)    CAUSED DEAFNESS  . Cefoxitin Nausea Only    ABD. PAIN  . Fish Allergy Nausea And Vomiting  . Iodine Nausea Only    JOINT PAIN  . Ivp Dye [Iodinated Diagnostic Agents] Nausea Only    JOINT PAIN  . Rifampin Nausea And Vomiting and Other (See Comments)    CHILLS  . Shellfish Allergy Swelling  . Vancomycin Other (See Comments)    Red Man Syndrome - was told that it was given to fast.  . Demerol Rash    Patient Measurements: Height:  (177.8 cm) Weight: 160 lb 15 oz (73 kg) IBW/kg (Calculated) : 73  Weight from Dec 2016 = 69 kg  Vital Signs: Temp: 97.8 F (36.6 C) (02/27 0354) Temp Source: Axillary (02/27 0354) BP: 148/77 mmHg (02/27 0600) Pulse Rate: 95 (02/27 0635) Intake/Output from previous day: 02/26 0701 - 02/27 0700 In: 1146.3 [I.V.:353; IV Piggyback:150; TPN:643.3] Out: 2065 [Urine:2065]  Labs:  Recent Labs  07/13/15 0430 07/14/15 0450 07/15/15 0318  WBC 17.2* 17.6* 16.3*  HGB 9.5* 9.6* 9.5*  HCT 28.3* 30.3* 29.2*  PLT 25* 43* 52*     Recent Labs  07/13/15 0430 07/14/15 0450 07/15/15 0318  NA 146* 147* 146*  K 3.4* 3.0* 2.8*  CL 106 105 102  CO2 32 35* 35*  GLUCOSE 139* 113* 125*  BUN 35* 31* 30*  CREATININE 1.26* 1.30* 1.31*  CALCIUM 7.8* 7.6* 7.6*  MG  --   --  1.6*  PHOS 3.0 2.7 2.5  ALBUMIN 1.1* 1.2* 1.2*  PREALBUMIN  --   --  6.6*  TRIG  --   --  80   Estimated Creatinine Clearance: 60.4 mL/min (by C-G formula based on Cr of 1.31).    Recent Labs  07/14/15 2009 07/15/15 0003 07/15/15 0353  GLUCAP 111* 123* 115*     Insulin Requirements in the past 24 hours:  4 units SSI    Assessment: 62 YOM with a history of Crohn's disease s/p ileostomy presented on 07/07/2015 with intermittent epigastric abdominal pain, cramping, and  vomiting for 2 months PTA. Per documentation, patient is a poor candidate for surgical J-tube placement and while awaiting for antibiotic changes and resolution of GI symptoms for adequate PO intake, pharmacy consulted to manage TPN for nutritional support.  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 TPN was held x 2 days from 2/24-2/25 due to fluid overload but resuming on 07/14/15.  GI: hx Crohn's / GERD / pancreatitis - baseline prealbumin 4.3 >> 6.6.  Ileostomy O/P not charted.  2/22 CT showed possible pseudocyst, negative for pancreatitis.  PPI IV.  Endo: chronic AI, no hx DM - hypoglycemia prior to TPN initiation and while on steroid.  CBGs controlled, on hydrocortisone twice daily, on sensitive SSI  Lytes: K+ 2.8 (8 runs ordered), Na+ improved to 146, CO2 unchanged at 35, Mag low at 1.6 (2 gm given) Renal: SCr 1.31, CrCL 60 ml/min - good UOP 1.6 ml/kg/hr; negative balance. Pulm: transferred to ICU 2/21 d/t resp distress - back on BiPAP.  2/24 CXR shows increased BL pulm infiltrates - with flash pulm edema Cards: no hx (EF 60-65%) - BP soft, tachy, troponin  0.06 - Lasix IV BID AC/Heme: Xarelto PTA for hx LLE DVT dx 02/2015, completed treatment course.  Hgb low but stable, plts improved to 52K. HIT negative Hepatobil: ALT normalized, others elevated.  Tbili improved to 1.6.  Lipase / TG WNL. Neuro: anxiety / hearing loss - possible cochlear implant ID: [id number] for serratia bacteremia with PICC as likely source, azith/clofazimine/tigecycline from PTA for hx M.abscessus osteo held - afebrile, WBC improving  Azith PTA >> 2/21 Clofazimine PTA >> 2/21 Tygacil PTA >> 2/20 (?caused pancreatitis) Vanc 2/21 >> 2/22 Merrem 2/21 >>2/24 Elita Quick 2/24>>2/25 CTX 2/25>>   Best Practices: restart SQ hep when Plt > 50K per MD  TPN Access: PICC placed 10/19/14 (for long-term abx administration) TPN start  date: 2/20 >>2/24; resumed 2/26, held 2/27  Current Nutrition:  Clear liquid diet (</= 20% of meals) TPN  Nutritional Goals:  2100-2300 kCal and 115-130 grams of protein per day   Plan:  - Hold TPN today due to worsening CXR per CCM, f/u in AM - Continue sensitive SSI Q6H    Lum Stillinger D. Laney Potash, PharmD, BCPS Pager:  [phone] 07/15/2015, 9:10 AM

## 2015-07-15 NOTE — Progress Notes (Addendum)
Subjective:  No new complaints  Antibiotics:  Anti-infectives    Start     Dose/Rate Route Frequency Ordered Stop   07/13/15 2330  cefTRIAXone (ROCEPHIN) 2 g in dextrose 5 % 50 mL IVPB     2 g 100 mL/hr over 30 Minutes Intravenous Every 24 hours 07/13/15 1633     07/12/15 1400  cefTAZidime (FORTAZ) 2 g in dextrose 5 % 50 mL IVPB  Status:  Discontinued     2 g 100 mL/hr over 30 Minutes Intravenous 3 times per day 07/12/15 1118 07/13/15 1633   07/10/15 0400  vancomycin (VANCOCIN) IVPB 750 mg/150 ml premix  Status:  Discontinued     750 mg 150 mL/hr over 60 Minutes Intravenous Every 12 hours 07/09/15 1545 07/10/15 0904   07/09/15 1515  vancomycin (VANCOCIN) 2,000 mg in sodium chloride 0.9 % 500 mL IVPB     2,000 mg 250 mL/hr over 120 Minutes Intravenous  Once 07/09/15 1500 07/09/15 1846   07/09/15 1515  meropenem (MERREM) 1 g in sodium chloride 0.9 % 100 mL IVPB  Status:  Discontinued     1 g 200 mL/hr over 30 Minutes Intravenous 3 times per day 07/09/15 1500 07/12/15 1001   07/14/2015 2000  tigecycline (TYGACIL) 50 mg in sodium chloride 0.9 % 100 mL IVPB  Status:  Discontinued     50 mg 200 mL/hr over 30 Minutes Intravenous Every 12 hours 07/13/2015 1511 07/08/15 1217   07/10/2015 1600  dextrose 5 % 300 mL with azithromycin (ZITHROMAX) 600 mg infusion  Status:  Discontinued     150 mL/hr  Intravenous Continuous 06/20/2015 1521 07/04/2015 1525   06/20/2015 1600  dextrose 5 % 300 mL with azithromycin (ZITHROMAX) 600 mg infusion  Status:  Discontinued     150 mL/hr  Intravenous Every 24 hours 07/09/2015 1525 07/09/15 1500   07/09/2015 1530  azithromycin (ZITHROMAX) 500 mg in dextrose 5 % 250 mL IVPB  Status:  Discontinued     500 mg 250 mL/hr over 60 Minutes Intravenous Every 24 hours 07/13/2015 1517 07/07/2015 1521      Medications: Scheduled Meds: . antiseptic oral rinse  7 mL Mouth Rinse q12n4p  . cefTRIAXone (ROCEPHIN)  IV  2 g Intravenous Q24H  . chlorhexidine  15 mL Mouth Rinse BID    . feeding supplement  1 Container Oral TID BM  . furosemide  80 mg Intravenous BID  . hydrocortisone sod succinate (SOLU-CORTEF) inj  20 mg Intravenous QPM  . hydrocortisone sod succinate (SOLU-CORTEF) inj  40 mg Intravenous QAC breakfast  . insulin aspart  0-9 Units Subcutaneous 4 times per day  . pantoprazole (PROTONIX) IV  40 mg Intravenous Q24H  . pneumococcal 23 valent vaccine  0.5 mL Intramuscular Tomorrow-1000  . potassium chloride  10 mEq Intravenous Q1H  . sodium chloride  1,000 mL Intravenous Once  . sodium chloride flush  3 mL Intravenous Q12H   Continuous Infusions: . dextrose 10 mL/hr at 07/15/15 0957   PRN Meds:.feeding supplement, HYDROmorphone (DILAUDID) injection, LORazepam, metoprolol, promethazine, sodium chloride flush    Objective: Weight change: 7.1 oz (0.2 kg)  Intake/Output Summary (Last 24 hours) at 07/15/15 1327 Last data filed at 07/15/15 1318  Gross per 24 hour  Intake 1579.68 ml  Output   2580 ml  Net -1000.32 ml   Blood pressure 121/97, pulse 118, temperature 98.4 F (36.9 C), temperature source Oral, resp. rate 24, height 5\' 10"  (1.778 m), weight 160 lb  15 oz (73 kg), SpO2 88 %. Temp:  [97.6 F (36.4 C)-98.4 F (36.9 C)] 98.4 F (36.9 C) (02/27 1133) Pulse Rate:  [68-131] 118 (02/27 1300) Resp:  [18-37] 24 (02/27 1300) BP: (110-183)/(66-108) 121/97 mmHg (02/27 1300) SpO2:  [87 %-100 %] 88 % (02/27 1300) FiO2 (%):  [50 %-60 %] 60 % (02/27 0800) Weight:  [160 lb 15 oz (73 kg)] 160 lb 15 oz (73 kg) (02/27 0500)  Physical Exam: General: alert but at times confused HEENT: anicteric sclera,  EOMI, deaf, COLOR MUCH BETTER CVS tachy rate, normal r,  no murmur rubs or gallops Chest: on BIPAP Abdomen: soft  Less tender Extremities:his surgical site is clean Skin: echymoses, edema diffusely, PICC site dressed, left IJ in place Neuro: nonfocal  CBC: CBC Latest Ref Rng 07/15/2015 07/14/2015 07/13/2015  WBC 4.0 - 10.5 K/uL 16.3(H) 17.6(H) 17.2(H)   Hemoglobin 13.0 - 17.0 g/dL 1.6(X) 0.9(U) 0.4(V)  Hematocrit 39.0 - 52.0 % 29.2(L) 30.3(L) 28.3(L)  Platelets 150 - 400 K/uL 52(L) 43(L) 25(LL)      BMET  Recent Labs  07/14/15 0450 07/15/15 0318  NA 147* 146*  K 3.0* 2.8*  CL 105 102  CO2 35* 35*  GLUCOSE 113* 125*  BUN 31* 30*  CREATININE 1.30* 1.31*  CALCIUM 7.6* 7.6*     Liver Panel   Recent Labs  07/14/15 0450 07/15/15 0318  ALBUMIN 1.2* 1.2*       Sedimentation Rate No results for input(s): ESRSEDRATE in the last 72 hours. C-Reactive Protein No results for input(s): CRP in the last 72 hours.  Micro Results: Recent Results (from the past 720 hour(s))  Culture, blood (Routine X 2) w Reflex to ID Panel     Status: None   Collection Time: 07/09/15  1:30 PM  Result Value Ref Range Status   Specimen Description BLOOD RIGHT HAND  Final   Special Requests IN PEDIATRIC BOTTLE 1CC  Final   Culture  Setup Time   Final    GRAM NEGATIVE RODS AEROBIC BOTTLE ONLY CRITICAL RESULT CALLED TO, READ BACK BY AND VERIFIED WITH: F FLYNT,RN @0713  07/10/15 MKELLY    Culture SERRATIA MARCESCENS  Final   Report Status 07/12/2015 FINAL  Final   Organism ID, Bacteria SERRATIA MARCESCENS  Final      Susceptibility   Serratia marcescens - MIC*    CEFAZOLIN >=64 RESISTANT Resistant     CEFEPIME <=1 SENSITIVE Sensitive     CEFTAZIDIME <=1 SENSITIVE Sensitive     CEFTRIAXONE <=1 SENSITIVE Sensitive     CIPROFLOXACIN <=0.25 SENSITIVE Sensitive     GENTAMICIN <=1 SENSITIVE Sensitive     TRIMETH/SULFA <=20 SENSITIVE Sensitive     * SERRATIA MARCESCENS  Culture, blood (Routine X 2) w Reflex to ID Panel     Status: None   Collection Time: 07/09/15  1:40 PM  Result Value Ref Range Status   Specimen Description BLOOD RIGHT ARM  Final   Special Requests IN PEDIATRIC BOTTLE 2CC  Final   Culture  Setup Time   Final    GRAM NEGATIVE RODS AEROBIC BOTTLE ONLY CRITICAL RESULT CALLED TO, READ BACK BY AND VERIFIED WITH: F FLYNT,RN  @0713  07/10/15 MKELLY    Culture   Final    SERRATIA MARCESCENS SUSCEPTIBILITIES PERFORMED ON PREVIOUS CULTURE WITHIN THE LAST 5 DAYS.    Report Status 07/12/2015 FINAL  Final  MRSA PCR Screening     Status: None   Collection Time: 07/09/15  2:02 PM  Result Value  Ref Range Status   MRSA by PCR NEGATIVE NEGATIVE Final    Comment:        The GeneXpert MRSA Assay (FDA approved for NASAL specimens only), is one component of a comprehensive MRSA colonization surveillance program. It is not intended to diagnose MRSA infection nor to guide or monitor treatment for MRSA infections.   Culture, blood (Routine X 2) w Reflex to ID Panel     Status: None (Preliminary result)   Collection Time: 07/10/15  1:20 PM  Result Value Ref Range Status   Specimen Description BLOOD RIGHT HAND  Final   Special Requests IN PEDIATRIC BOTTLE .5CC  Final   Culture NO GROWTH 4 DAYS  Final   Report Status PENDING  Incomplete  Culture, blood (Routine X 2) w Reflex to ID Panel     Status: None (Preliminary result)   Collection Time: 07/10/15  1:43 PM  Result Value Ref Range Status   Specimen Description BLOOD RIGHT HAND  Final   Special Requests IN PEDIATRIC BOTTLE 3CC  Final   Culture NO GROWTH 4 DAYS  Final   Report Status PENDING  Incomplete    Studies/Results: Dg Chest Port 1 View  07/15/2015  CLINICAL DATA:  Pulmonary edema EXAM: PORTABLE CHEST 1 VIEW COMPARISON:  07/14/2015 FINDINGS: Cardiomediastinal silhouette is stable. There is worsening in aeration with bilateral airspace opacities and interstitial prominence consistent with worsening pulmonary edema. Left IJ central line with tip in right atrium is unchanged in position. There is no pneumothorax. Degenerative changes bilateral shoulders. IMPRESSION: Bilateral mild pulmonary edema with slight worsening from prior exam. Stable left IJ central line position. No pneumothorax. Electronically Signed   By: Natasha Mead M.D.   On: 07/15/2015 08:09   Dg  Chest Port 1 View  07/14/2015  CLINICAL DATA:  65 year old male with respiratory failure. EXAM: PORTABLE CHEST 1 VIEW COMPARISON:  07/13/2015 and prior exams FINDINGS: Cardiomegaly and left IJ central venous catheter with tip overlying the superior cavoatrial junction again noted. Bilateral airspace opacities have is slightly decreased. There is no evidence of pneumothorax. No other significant changes identified. IMPRESSION: Slightly decreased bilateral airspace opacities/ edema. Otherwise unchanged appearance chest. Electronically Signed   By: Harmon Pier M.D.   On: 07/14/2015 07:23      Assessment/Plan:  INTERVAL HISTORY:   07/09/15: patient with worsening abdominal pain, difficulty breathing transfer to ICU for NIPPV 07/10/15: Blood cultures with 2/2 GNR 07/11/15: PICC removed, CT without abscess or necroisis in pancreas 07/12/15: Blood culture showed Serratia yet again., Patient decompensated with pulmonary edema today requiring more aggressive diuresis  2/25--2/27: pt still being diuresed now off bipap     07/12/15 Principal Problem:   Acute pancreatitis Active Problems:   Crohn's disease (HCC)   Atypical mycobacterial infection of hand   AKI (acute kidney injury) (HCC)   PICC (peripherally inserted central catheter) in place   Chronic adrenal insufficiency (HCC)   Epigastric abdominal pain   Non-intractable cyclical vomiting with nausea   Anorexia   Abnormal loss of weight   Crohn's disease involving terminal ileum (HCC)   Crohn disease (HCC)   Elevated LFTs   Osteomyelitis of right wrist (HCC)   Osteomyelitis of left wrist (HCC)   Pyogenic arthritis of left wrist (HCC)   Malnutrition of moderate degree   Acute hypoxemic respiratory failure (HCC)   Sinus tachycardia (HCC)   Sepsis (HCC)   Acute pulmonary edema (HCC)   Lactic acidosis   History of small bowel obstruction  ARDS (adult respiratory distress syndrome) (HCC)   Septic shock (HCC)   PICC line infection    Gram negative sepsis (HCC)   Bacterial infection due to Serratia    Lee Klein is a 65 y.o. 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 He is admitted for possible pancreatitis with an elevated lipase in the setting of nausea and vomiting. We had stopped his tigecycline yesterday in hopes that this was potentially the culprit antibiotic for causing his nausea vomiting and abdominal pain. Unfortunately his abdominal pain worsened and he developed sepsis from GNR. CT shows possible pancreatic pseudo-cyst but no necrosis and no intra-abdominal abscesses He had recurrent Serratia bacteremia  #1 Gram negative sepsis with recurrent Serratia bacteremia likely due to PICC line never being removed during last admission  I would DC HIS LEFT IJ SINCE IT WAS PLACED WHEN HE WAS FLORIDLY BACTEREMIC AND SERRATIA MAY HAVE INFECTED THIS LINE  Since he still needs central access can place PICC line for now  He has had 6 days of effective antibiotics post PICC line removal already  I would continue the ceftriaxone he is on currently while he is still needing IV acess  AT MINIMUM I would give him 2 weeks from date of removal of his IJ (today hopefully) of effective anti Serratia systemic antibiotics  If he can get ALL lines out this could be completed with bioavailable ciprofloxacin    #2 Nausea and vomiting with pancreatitis. Hepatitis: --going to watch the patient off his antimycobacterial drugs and hope we'll continue to improve hopefully won't need TPN for very long  #3 Bilateral infiltrates with hypoxia: agree this could be pulmonary edema from cardiogenic source, it could be aRDS and pancreatitis.  #4 thrombocytopenia: stablizing  #5 Mycobacterium abscesses osteomyelitis and septic arthritis.: We are going to  take a "holiday" from his anti-Mycobacterium antibiotics for now. Hopefully if he recovers from his acute illness and we can then revisit whether to rechallenge him again with these antibiotics or observe him off of them.  I spent greater than 35 miinutes with the patient including greater than 50% of time in face to face counsel of the patient and his wife regarding his  Serratia bacteremia, sepsis,  his Mycobacterium abscesses osteomyelitis, his nausea vomiting and possible pancreatitis his worsening abdominal pain worsening hypoxemia with bilateral pulmonary infiltrates thrombocytopenia and in coordination of his care withCCM.  I will check in on him again tomorrow and then will sign off and plan on seeing him in followup in our clinic to ensure we have for his left hand infection to ensure that it is not recurring  Greatly appreciate the diligent and careful attention he has received from   Critical Care, GI and Primary team.      LOS: 10 days   Lee Klein 07/15/2015, 1:27 PM

## 2015-07-15 NOTE — Progress Notes (Signed)
PULMONARY / CRITICAL CARE MEDICINE   Name: Lee Klein MRN: [id number] DOB: 06-25-1950    ADMISSION DATE:  06/28/2015 CONSULTATION DATE:  07/09/15  REFERRING MD:  Dr. Oswaldo Done /  Internal Medicine  CHIEF COMPLAINT:  Abdominal pain and vomiting   BRIEF PATIENT DESCRIPTION : The patient was admitted for acute on chronic nausea and vomiting. He has Crohn's disease. Also with significant comorbidities. 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 He was treated as drug-induced pancreatitis. GI and ID have been following. He had lactic acidosis. Had fluid resuscitation. Had resp distress on 2/21. Chest x-ray with bilateral infiltrates, new from admission. He went into respiratory distress. O2 sats 60% on non breather mask. Transferred to ICU.  ABG prior to transfer was 7.26, PCO2 33, PO2 48 on 100% nonrebreather mask. He received 80 mg IV Lasix. Patient is 12 L positive since admission.  SIGNIFICANT EVENTS:  07/03/2015 admitted for nausea vomiting, possible drug-induced pancreatitis 07/09/15 went into resp distress: O2 sats 60% on NRBM; transferred to ICU  07/12/15 marked fluid overload unable to wean off bipap  STUDIES : [name] Abd (2/17) >> Heterogeneous echogenicity of pancreas which could due pancreatic inflammation; small ascites; fatty liver 2D echo (05/2014) >> EF 50%, CHFpEF CT Abd/pelvis - negative for pancreatic necrosis or abscess or complication related to Crohn's disease. New moderate bilateral pleural effusions. New moderate volume of abdominal ascites. 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  Echo 2/26 - normal EF, G1DD  IMAGING : CXR (2/17) >> no active dse CXR (2/21) >> acute B infiltrates CXR 2/26 >> Improving B/L infiltrates.   CULTURES: Blood culture (2/21) >> GNR, Serratia BCx (2/22) >> NGTD  ANTIBIOTICS :  Clofazimine 2/17 >> 2/21 Azithromycin 2/17 >> 2/21 Tigecycline 2/17 -2/20 Vanc 2/21>> 2/22 Meropenem  2/21>>2/24 Ceftazidine 2/24>> 2/25 Ceftriaxone 2/25 >>  LINES:  R PICC --since 03/2015 at least (wife does not remember) [facility] 2/21>> Right ileostomy   SUBJECTIVE:  Feels well with no complaints. On Bipap again overnight; unable to wean due to desats  VITAL SIGNS: BP 148/77 mmHg  Pulse 68  Temp(Src) 97.8 F (36.6 C) (Axillary)  Resp 24  Ht  (1.778 m)  Wt 160 lb 7.9 oz (72.8 kg)  BMI 23.03 kg/m2  SpO2 96%  HEMODYNAMICS: CVP:  [2 mmHg-3 mmHg] 2 mmHg  VENTILATOR SETTINGS: Vent Mode:  [-]  FiO2 (%):  [50 %-100 %] 60 %on Bipap 12/6  80% FiO2.   INTAKE / OUTPUT: I/O last 3 completed shifts: In: 1033.3 [P.O.:120; I.V.:720; IV Piggyback:100] Out: 3880 [Urine:3580; Stool:300]   PHYSICAL EXAMINATION:  General:  Awake, alert. No distress Neuro:  No focal deficits. Awake and alert HEENT:  Bipap on face. Moist mucus membranes.  Cardiovascular:  RRR. S1, S2. No MRG  Lungs: Non labored breathing. No wheeze or crackles.  Abdomen:  Soft. + bowel sounds. Non tender. (+) Ostomy in R abd.  Musculoskeletal: +2 bilateral edema. Patient with chronic left hand osteomyelitis. Skin:  Warm and dry. No rash.  LABS:  BMET  Recent Labs Lab 07/13/15 0430 07/14/15 0450 07/15/15 0318  NA 146* 147* 146*  K 3.4* 3.0* 2.8*  CL 106 105 102  CO2 32 35* 35*  BUN 35* 31* 30*  CREATININE 1.26* 1.30* 1.31*  GLUCOSE 139* 113* 125*    Electrolytes  Recent Labs Lab 07/10/15 0016 07/11/15 0627  07/13/15 0430 07/14/15 0450 07/15/15 0318  CALCIUM 7.5* 7.8*  < > 7.8* 7.6* 7.6*  MG 1.8 1.9  --   --   --  1.6*  PHOS 5.0* 3.0  --  3.0 2.7 2.5  < > = values in this interval not displayed.  CBC  Recent Labs Lab 07/13/15 0430 07/14/15 0450 07/15/15 0318  WBC 17.2* 17.6* 16.3*  HGB 9.5* 9.6* 9.5*  HCT 28.3* 30.3* 29.2*  PLT 25* 43* 52*    Coag's  Recent Labs Lab 07/09/15 1234 07/09/15 1842 07/10/15 0016 07/10/15 0814  APTT 43* 95* 94* 88*  INR 2.10*  --   --   --      Sepsis Markers  Recent Labs Lab 07/09/15 1235 07/10/15 1308 07/11/15 0638  LATICACIDVEN 6.0* 3.6* 2.8*    ABG  Recent Labs Lab 07/09/15 1145 07/09/15 1535  PHART 7.266* 7.316*  PCO2ART 33.4* 35.0  PO2ART 47.9* 73.0*    Liver Enzymes  Recent Labs Lab 07/09/15 1233 07/10/15 1100 07/11/15 0627 07/13/15 0430 07/14/15 0450 07/15/15 0318  AST 68* 79* 62*  --   --   --   ALT 60 54 48  --   --   --   ALKPHOS 300* 218* 200*  --   --   --   BILITOT 1.8* 2.2* 1.6*  --   --   --   ALBUMIN 1.5* 1.4* 1.3* 1.1* 1.2* 1.2*    Cardiac Enzymes  Recent Labs Lab 07/09/15 1233 07/09/15 1842 07/10/15 0016  TROPONINI 0.03 0.06* 0.06*    Glucose  Recent Labs Lab 07/14/15 0753 07/14/15 1159 07/14/15 1609 07/14/15 2009 07/15/15 0003 07/15/15 0353  GLUCAP 94 141* 125* 111* 123* 115*    Imaging Dg Chest Port 1 View  07/15/2015  CLINICAL DATA:  Pulmonary edema EXAM: PORTABLE CHEST 1 VIEW COMPARISON:  07/14/2015 FINDINGS: Cardiomediastinal silhouette is stable. There is worsening in aeration with bilateral airspace opacities and interstitial prominence consistent with worsening pulmonary edema. Left IJ central line with tip in right atrium is unchanged in position. There is no pneumothorax. Degenerative changes bilateral shoulders. IMPRESSION: Bilateral mild pulmonary edema with slight worsening from prior exam. Stable left IJ central line position. No pneumothorax. Electronically Signed   By: Natasha Mead M.D.   On: 07/15/2015 08:09    DISCUSSION: The patient was admitted with acute on chronic nausea and vomiting. He has Crohn's disease. Also with significant comorbidities. He has chronic left hand osteomyelitis (Mycobacteriun abscessus)  for which he is on chronic antibiotics. Since admission, he has been  treated as most likely drug-induced pancreatitis (Tigecycline).    On 2/21, pt had worsening resp status likely from volume overload. Chest x-ray with bilateral  infiltrates, new from admission. Responding to lasix.   ASSESSMENT / PLAN:  PULMONARY A: Acute hypoxemic respiratory failure secondary: Pulmonary edema, volume overload Acutely worsening pulmonary edema >> worsened with initiation TPN again  P:   Try to wean off Bipap again to HFNC. Continue Lasix 80mg  BID CXR with again worsening pulmonary edema  CARDIOVASCULAR A:  Sinus tachycardia from underlying pulmonary edema  Patient with diastolic dysfunction based on 1610/9604 echo, EF nml. Not known to have CAD.  P:  Monitor on telemtry Serial troponins stable Repeat echo 2/26 - normal EF, G1DD. No change.  Start Lopressor prn for diastolic HF, HR and pressures  RENAL A:   Acute kidney injury. - stable Metabolic acidosis. resolved Volume overload. P:   Lasix 80mg  BID Replace electrolytes as indicated KVO Monitor UOP; goal neg balance  GASTROINTESTINAL A:  Crohn's disease requiring steroids. Drug-induced pancreatitis (  Tigecycline)  - Lipase improved Chronic nausea, vomiting. History of antibiotic related diarrhea. Severe malnutrition P:   D/c TPN as volume status worsened when restarted Will try to advance diet today if off Bipap May need to consider other sources of nutritional therapy Continue steroids, daily  HEMATOLOGIC A:   Thrombocytopenia, acute and chronic. Baseline was in the 140s. Possible drug induced. HIT neg. - Improving H/O left lower extremity DVT in October 2016. By history, unprovoked. He was on Xarelto outpatient. 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 Abx for 1  yr. Lactic acidosis - downtrending PICC likely source of bacteremia, grew Serratia; PICC removed P:   Abx as above. Narrowed to ceftriaxone as per ID, ID following - drug holiday for M. abscessus Will need at least 2 weeks antibiotics  ENDOCRINE A:  Chronic adrenal insufficiency.  P:   Continue IV steroids Monitor sugars SSI  NEUROLOGIC A:  Anxiety, chronic  P:   RASS goal: 0 Judicious use of benzos.  Ativan .5-1 mg prn for anxiety Restart home PO medications when taking PO (off Bipap)  FAMILY  - Updates: Updated wife at bedside.   Caryl Ada, DO 07/15/2015, 7:45 AM PGY-2, [facility]

## 2015-07-15 NOTE — Progress Notes (Signed)
Took pt off bipap and placed on HFNC at 15L. Pt spo2 immediately dropped to 78%. Pt not in distress, but placed back on bipap at this time.

## 2015-07-15 NOTE — Progress Notes (Signed)
Caryl Ada, DO ordered to stop TPN and Lipids and continue with D10 at 10cc/hr. RN to monitor patients CBG's Q4 hours.

## 2015-07-15 NOTE — Progress Notes (Signed)
Nutrition Follow-up / Consult  DOCUMENTATION CODES:   Non-severe (moderate) malnutrition in context of chronic illness  INTERVENTION:    Boost Breeze po TID, each supplement provides 250 kcal and 9 grams of protein  NUTRITION DIAGNOSIS:   Malnutrition related to chronic illness as evidenced by mild depletion of muscle mass, mild depletion of body fat.  Ongoing  GOAL:   Patient will meet greater than or equal to 90% of their needs  Unmet  MONITOR:   PO intake, Supplement acceptance, Diet advancement, Labs, Weight trends, Skin, I & O's  REASON FOR ASSESSMENT:   Consult Assessment of nutrition requirement/status  ASSESSMENT:   Mr. Retter is a 65 year old man with a history of Crohn's disease for 40 years status post an ileostomy and complicated by iatrogenic adrenal insufficiency from long-standing prednisone and deep venous thrombosis, left hand Mycobacterium abscesses osteomyelitis requiring long-term antibiotics and complicated by aminoglycoside-induced deafness, and recent Serratia bacteremia who presents with a several month history of intermittent epigastric abdominal pain with nausea and vomiting which acutely worsened the day prior to admission after the patient received 3 doses of Lomotil for increased ostomy output that converted to no ostomy output. In the ED he was noted to have a significant leukocytosis, lactic acidosis, acute kidney injury, and elevated lipase consistent with pancreatitis and was therefore admitted to the internal medicine teaching service for further evaluation and care. He denies a history of alcohol use or hypertriglyceridemia. He had a cholecystectomy in the past and has had no problems since. He has had pancreatitis in the past.  Discussed patient with RN today. TPN has been discontinued due to fluid retention. Plans to try off BiPAP and try PO's. Received MD Consult for assessment of nutrition status. Wife reports that patient drinks Ensure  Enlive at home, likes Administrator. Willing to try [employer]. Answered wife's questions about albumin. Explained that albumin is strongly affected by stress response and inflammatory process and would not expect to see an improvement in this lab value during acute hospitalization.   Diet Order:  Diet clear liquid Room service appropriate?: Yes; Fluid consistency:: Thin  Skin:  Reviewed, no issues  Last BM:  2/27  Height:   Ht Readings from Last 1 Encounters:  Jul 22, 2015  (1.778 m)    Weight:   Wt Readings from Last 1 Encounters:  07/15/15 160 lb 15 oz (73 kg)    Ideal Body Weight:  75.5 kg  BMI:  Body mass index is 23.09 kg/(m^2).  Estimated Nutritional Needs:   Kcal:  2100-2300  Protein:  115-130 grams  Fluid:  2.1-2.3 L  EDUCATION NEEDS:   Education needs addressed  Joaquin Courts, RD, LDN, CNSC Pager [phone] After Hours Pager [phone]

## 2015-07-16 ENCOUNTER — Inpatient Hospital Stay (HOSPITAL_COMMUNITY): Payer: Managed Care, Other (non HMO)

## 2015-07-16 ENCOUNTER — Telehealth (INDEPENDENT_AMBULATORY_CARE_PROVIDER_SITE_OTHER): Payer: Self-pay | Admitting: Internal Medicine

## 2015-07-16 ENCOUNTER — Encounter (INDEPENDENT_AMBULATORY_CARE_PROVIDER_SITE_OTHER): Payer: Self-pay

## 2015-07-16 DIAGNOSIS — Z9911 Dependence on respirator [ventilator] status: Secondary | ICD-10-CM

## 2015-07-16 LAB — BLOOD GAS, ARTERIAL
ACID-BASE EXCESS: 10.9 mmol/L — AB (ref 0.0–2.0)
BICARBONATE: 35.1 meq/L — AB (ref 20.0–24.0)
Drawn by: 39899
FIO2: 0.6
LHR: 15 {breaths}/min
MECHVT: 560 mL
O2 Saturation: 98.2 %
PEEP/CPAP: 10 cmH2O
PO2 ART: 125 mmHg — AB (ref 80.0–100.0)
Patient temperature: 97.4
TCO2: 36.5 mmol/L (ref 0–100)
pCO2 arterial: 45.5 mmHg — ABNORMAL HIGH (ref 35.0–45.0)
pH, Arterial: 7.495 — ABNORMAL HIGH (ref 7.350–7.450)

## 2015-07-16 LAB — BASIC METABOLIC PANEL
Anion gap: 9 (ref 5–15)
BUN: 28 mg/dL — AB (ref 6–20)
CHLORIDE: 105 mmol/L (ref 101–111)
CO2: 35 mmol/L — ABNORMAL HIGH (ref 22–32)
CREATININE: 1.47 mg/dL — AB (ref 0.61–1.24)
Calcium: 7.6 mg/dL — ABNORMAL LOW (ref 8.9–10.3)
GFR calc Af Amer: 57 mL/min — ABNORMAL LOW (ref >60)
GFR calc non Af Amer: 49 mL/min — ABNORMAL LOW (ref >60)
GLUCOSE: 153 mg/dL — AB (ref 65–99)
POTASSIUM: 4.4 mmol/L (ref 3.5–5.1)
SODIUM: 149 mmol/L — AB (ref 135–145)

## 2015-07-16 LAB — RENAL FUNCTION PANEL
ALBUMIN: 1.3 g/dL — AB (ref 3.5–5.0)
Anion gap: 11 (ref 5–15)
BUN: 30 mg/dL — ABNORMAL HIGH (ref 6–20)
CHLORIDE: 103 mmol/L (ref 101–111)
CO2: 36 mmol/L — ABNORMAL HIGH (ref 22–32)
CREATININE: 1.31 mg/dL — AB (ref 0.61–1.24)
Calcium: 7.8 mg/dL — ABNORMAL LOW (ref 8.9–10.3)
GFR, EST NON AFRICAN AMERICAN: 57 mL/min — AB (ref >60)
Glucose, Bld: 93 mg/dL (ref 65–99)
PHOSPHORUS: 3.3 mg/dL (ref 2.5–4.6)
POTASSIUM: 3 mmol/L — AB (ref 3.5–5.1)
Sodium: 150 mmol/L — ABNORMAL HIGH (ref 135–145)

## 2015-07-16 LAB — CBC
HEMATOCRIT: 30.9 % — AB (ref 39.0–52.0)
Hemoglobin: 9.8 g/dL — ABNORMAL LOW (ref 13.0–17.0)
MCH: 31.7 pg (ref 26.0–34.0)
MCHC: 31.7 g/dL (ref 30.0–36.0)
MCV: 100 fL (ref 78.0–100.0)
Platelets: 56 10*3/uL — ABNORMAL LOW (ref 150–400)
RBC: 3.09 MIL/uL — AB (ref 4.22–5.81)
RDW: 16.3 % — ABNORMAL HIGH (ref 11.5–15.5)
WBC: 19.4 10*3/uL — AB (ref 4.0–10.5)

## 2015-07-16 LAB — GLUCOSE, CAPILLARY
GLUCOSE-CAPILLARY: 131 mg/dL — AB (ref 65–99)
GLUCOSE-CAPILLARY: 133 mg/dL — AB (ref 65–99)
GLUCOSE-CAPILLARY: 66 mg/dL (ref 65–99)
GLUCOSE-CAPILLARY: 68 mg/dL (ref 65–99)
GLUCOSE-CAPILLARY: 93 mg/dL (ref 65–99)
Glucose-Capillary: 86 mg/dL (ref 65–99)

## 2015-07-16 LAB — MAGNESIUM: MAGNESIUM: 1.9 mg/dL (ref 1.7–2.4)

## 2015-07-16 LAB — PHOSPHORUS: PHOSPHORUS: 3.3 mg/dL (ref 2.5–4.6)

## 2015-07-16 MED ORDER — FENTANYL CITRATE (PF) 100 MCG/2ML IJ SOLN
100.0000 ug | Freq: Once | INTRAMUSCULAR | Status: AC
  Administered 2015-07-16: 100 ug via INTRAVENOUS

## 2015-07-16 MED ORDER — POTASSIUM CHLORIDE 10 MEQ/50ML IV SOLN
10.0000 meq | INTRAVENOUS | Status: DC
  Filled 2015-07-16: qty 50

## 2015-07-16 MED ORDER — MIDAZOLAM HCL 2 MG/2ML IJ SOLN
INTRAMUSCULAR | Status: AC
  Filled 2015-07-16: qty 2

## 2015-07-16 MED ORDER — VITAL AF 1.2 CAL PO LIQD
1000.0000 mL | ORAL | Status: DC
  Administered 2015-07-16 – 2015-07-29 (×7): 1000 mL

## 2015-07-16 MED ORDER — ETOMIDATE 2 MG/ML IV SOLN
20.0000 mg | Freq: Once | INTRAVENOUS | Status: AC
  Administered 2015-07-16: 20 mg via INTRAVENOUS

## 2015-07-16 MED ORDER — SODIUM CHLORIDE 0.9 % IV BOLUS (SEPSIS)
500.0000 mL | Freq: Once | INTRAVENOUS | Status: AC
  Administered 2015-07-16: 500 mL via INTRAVENOUS

## 2015-07-16 MED ORDER — FENTANYL CITRATE (PF) 100 MCG/2ML IJ SOLN
50.0000 ug | Freq: Once | INTRAMUSCULAR | Status: AC
  Administered 2015-07-16: 50 ug via INTRAVENOUS

## 2015-07-16 MED ORDER — ROCURONIUM BROMIDE 50 MG/5ML IV SOLN
70.0000 mg | Freq: Once | INTRAVENOUS | Status: AC
  Administered 2015-07-16: 70 mg via INTRAVENOUS

## 2015-07-16 MED ORDER — DEXTROSE 50 % IV SOLN
INTRAVENOUS | Status: AC
  Administered 2015-07-16: 50 mL
  Filled 2015-07-16: qty 50

## 2015-07-16 MED ORDER — FENTANYL CITRATE (PF) 100 MCG/2ML IJ SOLN
INTRAMUSCULAR | Status: AC
  Filled 2015-07-16: qty 2

## 2015-07-16 MED ORDER — POTASSIUM CHLORIDE 10 MEQ/50ML IV SOLN
10.0000 meq | INTRAVENOUS | Status: AC
  Administered 2015-07-16 (×4): 10 meq via INTRAVENOUS
  Filled 2015-07-16 (×3): qty 50

## 2015-07-16 MED ORDER — POTASSIUM CHLORIDE 10 MEQ/50ML IV SOLN
10.0000 meq | INTRAVENOUS | Status: AC
  Administered 2015-07-16 (×4): 10 meq via INTRAVENOUS
  Filled 2015-07-16 (×4): qty 50

## 2015-07-16 MED ORDER — DEXTROSE 10 % IV SOLN
INTRAVENOUS | Status: DC
  Administered 2015-07-16: 12:00:00 via INTRAVENOUS
  Administered 2015-07-17: 25 mL/h via INTRAVENOUS

## 2015-07-16 MED ORDER — SODIUM CHLORIDE 0.9 % IV SOLN
25.0000 ug/h | INTRAVENOUS | Status: DC
  Administered 2015-07-16: 25 ug/h via INTRAVENOUS
  Administered 2015-07-17 (×2): 200 ug/h via INTRAVENOUS
  Administered 2015-07-17: 100 ug/h via INTRAVENOUS
  Administered 2015-07-18: 275 ug/h via INTRAVENOUS
  Administered 2015-07-18: 300 ug/h via INTRAVENOUS
  Administered 2015-07-19 (×2): 350 ug/h via INTRAVENOUS
  Administered 2015-07-19: 300 ug/h via INTRAVENOUS
  Administered 2015-07-20: 350 ug/h via INTRAVENOUS
  Administered 2015-07-20: 200 ug/h via INTRAVENOUS
  Administered 2015-07-21: 300 ug/h via INTRAVENOUS
  Administered 2015-07-21: 275 ug/h via INTRAVENOUS
  Filled 2015-07-16 (×15): qty 50

## 2015-07-16 MED ORDER — MIDAZOLAM HCL 2 MG/2ML IJ SOLN
1.0000 mg | Freq: Once | INTRAMUSCULAR | Status: AC
  Administered 2015-07-16: 2 mg via INTRAVENOUS

## 2015-07-16 MED ORDER — PRO-STAT SUGAR FREE PO LIQD
30.0000 mL | Freq: Two times a day (BID) | ORAL | Status: DC
  Administered 2015-07-16 – 2015-07-24 (×14): 30 mL
  Filled 2015-07-16 (×22): qty 30

## 2015-07-16 MED ORDER — FENTANYL BOLUS VIA INFUSION
50.0000 ug | INTRAVENOUS | Status: DC | PRN
  Administered 2015-07-18: 25 ug via INTRAVENOUS
  Administered 2015-07-18 (×2): 50 ug via INTRAVENOUS
  Administered 2015-07-19: 25 ug via INTRAVENOUS
  Administered 2015-07-19 (×3): 50 ug via INTRAVENOUS
  Administered 2015-07-19: 100 ug via INTRAVENOUS
  Administered 2015-07-20: 50 ug via INTRAVENOUS
  Filled 2015-07-16: qty 50

## 2015-07-16 NOTE — Progress Notes (Signed)
Nutrition Follow-up  DOCUMENTATION CODES:   Non-severe (moderate) malnutrition in context of chronic illness  INTERVENTION:    Initiate TF via Cortrak tube with Vital AF 1.2 at 25 ml/h and Prostat 30 ml BID on day 1; on day 2, increase to goal rate of 50 ml/h (1200 ml per day) to provide 1640 kcals, 120 gm protein, 973 ml free water daily.  NUTRITION DIAGNOSIS:   Malnutrition related to chronic illness as evidenced by mild depletion of muscle mass, mild depletion of body fat.  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 In the ED he was noted to have a significant leukocytosis, lactic acidosis, acute kidney injury, and elevated lipase consistent with pancreatitis and was therefore admitted to the internal medicine teaching service for further evaluation and care. He denies a history of alcohol use or hypertriglyceridemia. He had a cholecystectomy in the past and has had no problems since. He has had pancreatitis in the past.  Discussed patient in ICU rounds and with RN today. Patient required emergent intubation this AM due to worsening respiratory  failure. Cortrak tube was placed; RD to order TF. Cortrak tube tip in the distal stomach / pyloric region.  Patient is currently intubated on ventilator support MV: 8.3 L/min Temp (24hrs), Avg:97.7 F (36.5 C), Min:97.1 F (36.2 C), Max:98.4 F (36.9 C)   Diet Order:   NPO  Skin:  Reviewed, no issues  Last BM:  2/28  Height:   Ht Readings from Last 1 Encounters:  06/30/2015  (1.778 m)    Weight:   Wt Readings from Last 1 Encounters:  07/16/15 152 lb 1.9 oz (69 kg)    Ideal Body Weight:  75.5 kg  BMI:  Body mass index is 21.83 kg/(m^2).  Estimated Nutritional Needs:   Kcal:  1648  Protein:  115-130 gm  Fluid:  >/= 2 L  EDUCATION NEEDS:   Education needs addressed  Joaquin Courts, RD, LDN, CNSC Pager [phone] After Hours Pager [phone]

## 2015-07-16 NOTE — Progress Notes (Signed)
PULMONARY / CRITICAL CARE MEDICINE   Name: PRATHIK AMAN MRN: [id number] DOB: 1950-09-25    ADMISSION DATE:  07/09/2015 CONSULTATION DATE:  07/09/15  REFERRING MD:  Dr. Oswaldo Done /  Internal Medicine  CHIEF COMPLAINT:  Abdominal pain and vomiting   BRIEF PATIENT DESCRIPTION : The patient was admitted for acute on chronic nausea and vomiting. He has Crohn's disease. Also with significant comorbidities. 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 He was treated as drug-induced pancreatitis. GI and ID have been following. He had lactic acidosis. Had fluid resuscitation. Had resp distress on 2/21. Chest x-ray with bilateral infiltrates, new from admission. He went into respiratory distress. O2 sats 60% on non breather mask. Transferred to ICU.  ABG prior to transfer was 7.26, PCO2 33, PO2 48 on 100% nonrebreather mask. He received 80 mg IV Lasix. Patient is 12 L positive since admission.  SIGNIFICANT EVENTS:  07/04/2015 admitted for nausea vomiting, possible drug-induced pancreatitis 07/09/15 went into resp distress: O2 sats 60% on NRBM; transferred to ICU  07/12/15 marked fluid overload unable to wean off bipap  STUDIES : [name] Abd (2/17) >> Heterogeneous echogenicity of pancreas which could due pancreatic inflammation; small ascites; fatty liver 2D echo (05/2014) >> EF 50%, CHFpEF CT Abd/pelvis - negative for pancreatic necrosis or abscess or complication related to Crohn's disease. New moderate bilateral pleural effusions. New moderate volume of abdominal ascites. 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  Echo 2/26 - normal EF, G1DD  IMAGING : CXR (2/17) >> no active dse CXR (2/21) >> acute B infiltrates CXR 2/26 >> Improving B/L infiltrates.   CULTURES: Blood culture (2/21) >> GNR, Serratia BCx (2/22) >> NGTD  ANTIBIOTICS :  Clofazimine 2/17 >> 2/21 Azithromycin 2/17 >> 2/21 Tigecycline 2/17 -2/20 Vanc 2/21>> 2/22 Meropenem  2/21>>2/24 Ceftazidine 2/24>> 2/25 Ceftriaxone 2/25 >>  LINES:  R PICC --since 03/2015 at least (wife does not remember) [facility] 2/21>> Right ileostomy   SUBJECTIVE:  Back on Bipap again Central line removed and PICC replaced yesterday  Appears uncomfortable and delirious  VITAL SIGNS: BP 123/57 mmHg  Pulse 103  Temp(Src) 97.1 F (36.2 C) (Axillary)  Resp 22  Ht 5\' 10"  (1.778 m)  Wt 152 lb 1.9 oz (69 kg)  BMI 21.83 kg/m2  SpO2 96%  HEMODYNAMICS: CVP:  [0 mmHg-1 mmHg] 1 mmHg  VENTILATOR SETTINGS: Vent Mode:  [-]  FiO2 (%):  [60 %-80 %] 80 %on Bipap 12/6  80% FiO2.   INTAKE / OUTPUT: I/O last 3 completed shifts: In: 1746.3 [I.V.:363; IV Piggyback:650] Out: 3150 [Urine:2925; Stool:225]   PHYSICAL EXAMINATION:  General:  Awake, alert. No distress. Chronically ill-appearing Neuro:  No focal deficits. Awake and alert HEENT:  Bipap on face. Moist mucus membranes.  Cardiovascular:  RRR. S1, S2. No MRG  Lungs: Non labored breathing. No wheeze or crackles.  Abdomen:  Soft. + bowel sounds. Non tender. (+) Ostomy in R abd.  Musculoskeletal: +2 bilateral edema. Patient with chronic left hand osteomyelitis. Skin:  Warm and dry. No rash.  LABS:  BMET  Recent Labs Lab 07/15/15 0318 07/15/15 1312 07/16/15 0240  NA 146* 146* 150*  K 2.8* 3.2* 3.0*  CL 102 101 103  CO2 35* 34* 36*  BUN 30* 28* 30*  CREATININE 1.31* 1.34* 1.31*  GLUCOSE 125* 216* 93    Electrolytes  Recent Labs Lab 07/11/15 0627  07/14/15 0450 07/15/15 0318 07/15/15 1312 07/16/15 0240  CALCIUM 7.8*  < > 7.6* 7.6*  7.7* 7.8*  MG 1.9  --   --  1.6*  --  1.9  PHOS 3.0  < > 2.7 2.5  --  3.3  3.3  < > = values in this interval not displayed.  CBC  Recent Labs Lab 07/14/15 0450 07/15/15 0318 07/16/15 0240  WBC 17.6* 16.3* 19.4*  HGB 9.6* 9.5* 9.8*  HCT 30.3* 29.2* 30.9*  PLT 43* 52* 56*    Coag's  Recent Labs Lab 07/09/15 1234 07/09/15 1842 07/10/15 0016 07/10/15 0814  APTT 43*  95* 94* 88*  INR 2.10*  --   --   --     Sepsis Markers  Recent Labs Lab 07/09/15 1235 07/10/15 1308 07/11/15 0638  LATICACIDVEN 6.0* 3.6* 2.8*    ABG  Recent Labs Lab 07/09/15 1145 07/09/15 1535  PHART 7.266* 7.316*  PCO2ART 33.4* 35.0  PO2ART 47.9* 73.0*    Liver Enzymes  Recent Labs Lab 07/09/15 1233 07/10/15 1100 07/11/15 0627  07/14/15 0450 07/15/15 0318 07/16/15 0240  AST 68* 79* 62*  --   --   --   --   ALT 60 54 48  --   --   --   --   ALKPHOS 300* 218* 200*  --   --   --   --   BILITOT 1.8* 2.2* 1.6*  --   --   --   --   ALBUMIN 1.5* 1.4* 1.3*  < > 1.2* 1.2* 1.3*  < > = values in this interval not displayed.  Cardiac Enzymes  Recent Labs Lab 07/09/15 1233 07/09/15 1842 07/10/15 0016  TROPONINI 0.03 0.06* 0.06*    Glucose  Recent Labs Lab 07/15/15 0837 07/15/15 1132 07/15/15 1550 07/15/15 2010 07/15/15 2340 07/16/15 0323  GLUCAP 113* 110* 126* 88 93 86    Imaging Dg Chest Port 1 View  07/15/2015  CLINICAL DATA:  Line placement. EXAM: PORTABLE CHEST 1 VIEW COMPARISON:  Earlier today at 755 hours. FINDINGS: 1907 hours. Left internal jugular line unchanged. Right-sided PICC line versus less likely subclavian central line terminates at the low SVC versus cavoatrial junction. No pneumothorax. Patient rotated right. Cardiomegaly accentuated by AP portable technique. No pleural fluid. Interstitial edema is similar, moderate. Bibasilar airspace disease is not significantly changed. IMPRESSION: New right-sided line, terminating at the low SVC versus cavoatrial junction. No pneumothorax. Similar interstitial edema with bibasilar Airspace disease, likely atelectasis. Electronically Signed   By: Jeronimo Greaves M.D.   On: 07/15/2015 19:19   Dg Chest Port 1 View  07/15/2015  CLINICAL DATA:  Pulmonary edema EXAM: PORTABLE CHEST 1 VIEW COMPARISON:  07/14/2015 FINDINGS: Cardiomediastinal silhouette is stable. There is worsening in aeration with bilateral  airspace opacities and interstitial prominence consistent with worsening pulmonary edema. Left IJ central line with tip in right atrium is unchanged in position. There is no pneumothorax. Degenerative changes bilateral shoulders. IMPRESSION: Bilateral mild pulmonary edema with slight worsening from prior exam. Stable left IJ central line position. No pneumothorax. Electronically Signed   By: Natasha Mead M.D.   On: 07/15/2015 08:09    DISCUSSION: The patient was admitted with acute on chronic nausea and vomiting. He has Crohn's disease. Also with significant comorbidities. He has chronic left hand osteomyelitis (Mycobacteriun abscessus)  for which he is on chronic antibiotics. Since admission, he has been  treated as most likely drug-induced pancreatitis (Tigecycline).    On 2/21, pt had worsening resp status likely from volume overload. Chest x-ray with bilateral infiltrates, new from admission. Responding  to lasix.   ASSESSMENT / PLAN:  PULMONARY A: Acute hypoxemic respiratory failure secondary: Pulmonary edema, volume overload, ARDS Acutely worsening pulmonary edema >> worsened with initiation TPN again At risk for intubation  P:   Try to wean off Bipap again to HFNC. May need intubation today. Keep O2 saturations >90% Continue Lasix 80mg  BID CXR without significant change   CARDIOVASCULAR A:  Sinus tachycardia from underlying pulmonary edema  Patient with diastolic dysfunction based on 9604/5409 echo, EF nml. Not known to have CAD.  P:  Monitor on telemtry Serial troponins stable Repeat echo 2/26 - normal EF, G1DD. No change.  Continue Lopressor prn for diastolic HF, HR and pressures  RENAL A:   Acute kidney injury. - stable Metabolic acidosis. resolved Volume overload. P:   Lasix 80mg  BID Replace electrolytes as indicated KVO Monitor UOP; goal neg balance  GASTROINTESTINAL A:  Crohn's disease requiring steroids. Drug-induced pancreatitis (Tigecycline)  - Lipase  improved Chronic nausea, vomiting. History of antibiotic related diarrhea. Severe malnutrition P:   D/c TPN as volume status worsened when restarted Will try to advance diet today if off Bipap Start TFs with intubation Continue steroids, daily  HEMATOLOGIC A:   Thrombocytopenia, acute and chronic. Baseline was in the 140s. Possible drug induced. HIT neg. - Improving H/O left lower extremity DVT in October 2016. By history, unprovoked. He was on Xarelto outpatient. 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 Abx for 1 yr. Lactic acidosis - downtrending Serratia bacteremia -PICC on admission likely source P:   Abx as above. Narrowed to ceftriaxone as per ID ID following - drug holiday for M. abscessus IJ removed 2/27 - so continue Abx until 2/13. Central line removed and new PICC placed   ENDOCRINE A:  Chronic adrenal insufficiency.  P:   Continue IV steroids Monitor sugars SSI  NEUROLOGIC A:  Anxiety, chronic  P:   RASS goal: 0 Judicious use of benzos.  Ativan .5-1 mg prn for anxiety Restart home PO medications when taking PO (off Bipap)  FAMILY  - Updates: Updated wife at bedside.   Caryl Ada, DO 07/16/2015, 7:38 AM PGY-2, [facility]

## 2015-07-16 NOTE — Progress Notes (Signed)
Attempted to take patient off of bipap and placed on high flow nasal cannula.  Patient was agitated.  HR elevated to 150s.  Respiratory rate increased to 40s.  Sats dropped to low 50s.  Patient has become more confused and is trying to get out of bed.  Placed patient back on bipap with improvement in sats to 95%.  RN aware.  Will continue to monitor.

## 2015-07-16 NOTE — Procedures (Signed)
CORTRAK FEEDING TUBE PLACEMENT  Person Inserting Tube:  HOFFMAN, PAUL W Tube Type:  Cortrak - 43 inches Tube Location:  Right nare Initial Placement:  Postpyloric Cortrak Total Procedure Time:  12 Cortrak Secured At:  65 cm Procedure Comments:  Complications: none KUB ordered for placement verification.

## 2015-07-16 NOTE — Care Management Note (Signed)
Case Management Note  Patient Details  Name: Lee Klein MRN: [id number] Date of Birth: 15-Apr-1951  Subjective/Objective:      Pt transferred to ICU for respiratory distress requiring BIPAP     Action/Plan:    07/16/2015  Pt had to intubated today due to increase in resp distress  07/12/15 Pt remains on BIPAP - unable to wean, pt is on; TNA, IV lasix, IV solumedrol, IV antibiotics for serretia bacteremia.  CM will continue to monitor for disposition needs  07/09/15 Pt discharged home in December 2016 with [facility] for antibiotics (IV Tygacil).  Pt is from home at wife [name] (RN), pt also has home O2 with [facility].  CM contacted agency and informed of admit.  Expected Discharge Date:                  Expected Discharge Plan:  Home w Home Health Services  In-House Referral:     Discharge planning Services  CM Consult  Post Acute Care Choice:  Resumption of Svcs/PTA Provider Choice offered to:  Patient  DME Arranged:    DME Agency:     HH Arranged:  RN HH Agency:  [facility]  Status of Service:  In process, will continue to follow  Medicare Important Message Given:    Date Medicare IM Given:    Medicare IM give by:    Date Additional Medicare IM Given:    Additional Medicare Important Message give by:     If discussed at Long Length of Stay Meetings, dates discussed:    Additional Comments:  Cherylann Parr, RN 07/16/2015, 2:05 PM

## 2015-07-16 NOTE — Progress Notes (Signed)
Subjective:  Patient intubated due to increased work of breathing and confusion  Antibiotics:  Anti-infectives    Start     Dose/Rate Route Frequency Ordered Stop   07/13/15 2330  cefTRIAXone (ROCEPHIN) 2 g in dextrose 5 % 50 mL IVPB     2 g 100 mL/hr over 30 Minutes Intravenous Every 24 hours 07/13/15 1633     07/12/15 1400  cefTAZidime (FORTAZ) 2 g in dextrose 5 % 50 mL IVPB  Status:  Discontinued     2 g 100 mL/hr over 30 Minutes Intravenous 3 times per day 07/12/15 1118 07/13/15 1633   07/10/15 0400  vancomycin (VANCOCIN) IVPB 750 mg/150 ml premix  Status:  Discontinued     750 mg 150 mL/hr over 60 Minutes Intravenous Every 12 hours 07/09/15 1545 07/10/15 0904   07/09/15 1515  vancomycin (VANCOCIN) 2,000 mg in sodium chloride 0.9 % 500 mL IVPB     2,000 mg 250 mL/hr over 120 Minutes Intravenous  Once 07/09/15 1500 07/09/15 1846   07/09/15 1515  meropenem (MERREM) 1 g in sodium chloride 0.9 % 100 mL IVPB  Status:  Discontinued     1 g 200 mL/hr over 30 Minutes Intravenous 3 times per day 07/09/15 1500 07/12/15 1001   06/22/2015 2000  tigecycline (TYGACIL) 50 mg in sodium chloride 0.9 % 100 mL IVPB  Status:  Discontinued     50 mg 200 mL/hr over 30 Minutes Intravenous Every 12 hours 07/14/2015 1511 07/08/15 1217   07/04/2015 1600  dextrose 5 % 300 mL with azithromycin (ZITHROMAX) 600 mg infusion  Status:  Discontinued     150 mL/hr  Intravenous Continuous 06/25/2015 1521 07/03/2015 1525   06/27/2015 1600  dextrose 5 % 300 mL with azithromycin (ZITHROMAX) 600 mg infusion  Status:  Discontinued     150 mL/hr  Intravenous Every 24 hours 06/22/2015 1525 07/09/15 1500   07/07/2015 1530  azithromycin (ZITHROMAX) 500 mg in dextrose 5 % 250 mL IVPB  Status:  Discontinued     500 mg 250 mL/hr over 60 Minutes Intravenous Every 24 hours 07/12/2015 1517 06/21/2015 1521      Medications: Scheduled Meds: . antiseptic oral rinse  7 mL Mouth Rinse q12n4p  . cefTRIAXone (ROCEPHIN)  IV  2 g  Intravenous Q24H  . chlorhexidine  15 mL Mouth Rinse BID  . furosemide  80 mg Intravenous BID  . hydrocortisone sod succinate (SOLU-CORTEF) inj  20 mg Intravenous QPM  . hydrocortisone sod succinate (SOLU-CORTEF) inj  40 mg Intravenous QAC breakfast  . insulin aspart  0-9 Units Subcutaneous 6 times per day  . pantoprazole (PROTONIX) IV  40 mg Intravenous Q24H  . pneumococcal 23 valent vaccine  0.5 mL Intramuscular Tomorrow-1000  . potassium chloride  10 mEq Intravenous Q1 Hr x 4  . sodium chloride  1,000 mL Intravenous Once  . sodium chloride flush  3 mL Intravenous Q12H   Continuous Infusions: . dextrose 50 mL/hr at 07/16/15 1157  . fentaNYL infusion INTRAVENOUS 50 mcg/hr (07/16/15 1200)   PRN Meds:.fentaNYL, haloperidol lactate, HYDROmorphone (DILAUDID) injection, LORazepam, metoprolol, promethazine, sodium chloride flush, sodium chloride flush    Objective: Weight change: -8 lb 13.1 oz (-4 kg)  Intake/Output Summary (Last 24 hours) at 07/16/15 1256 Last data filed at 07/16/15 1200  Gross per 24 hour  Intake 843.34 ml  Output   1875 ml  Net -1031.66 ml   Blood pressure 127/80, pulse 112, temperature 97.4 F (36.3 C),  temperature source Oral, resp. rate 15, height 5\' 10"  (1.778 m), weight 152 lb 1.9 oz (69 kg), SpO2 100 %. Temp:  [97.1 F (36.2 C)-98.4 F (36.9 C)] 97.4 F (36.3 C) (02/28 1147) Pulse Rate:  [77-146] 112 (02/28 1230) Resp:  [14-35] 15 (02/28 1230) BP: (56-183)/(44-101) 127/80 mmHg (02/28 1230) SpO2:  [87 %-100 %] 100 % (02/28 1230) FiO2 (%):  [60 %-100 %] 60 % (02/28 1200) Weight:  [152 lb 1.9 oz (69 kg)] 152 lb 1.9 oz (69 kg) (02/28 0401)  Physical Exam: General: Intubated and sedated HEENT: anicteric sclera,  CVS tachy rate, normal r,  no murmur rubs or gallops Chest: Few coarse breath sounds anteriorly Abdomen: soft  Less tender Extremities:his surgical site is clean Skin: echymoses, edema diffusely, PICC site dressed, left IJ in place Neuro:  nonfocal  CBC: CBC Latest Ref Rng 07/16/2015 07/15/2015 07/14/2015  WBC 4.0 - 10.5 K/uL 19.4(H) 16.3(H) 17.6(H)  Hemoglobin 13.0 - 17.0 g/dL 1.6(X) 0.9(U) 0.4(V)  Hematocrit 39.0 - 52.0 % 30.9(L) 29.2(L) 30.3(L)  Platelets 150 - 400 K/uL 56(L) 52(L) 43(L)      BMET  Recent Labs  07/15/15 1312 07/16/15 0240  NA 146* 150*  K 3.2* 3.0*  CL 101 103  CO2 34* 36*  GLUCOSE 216* 93  BUN 28* 30*  CREATININE 1.34* 1.31*  CALCIUM 7.7* 7.8*     Liver Panel   Recent Labs  07/15/15 0318 07/16/15 0240  ALBUMIN 1.2* 1.3*       Sedimentation Rate No results for input(s): ESRSEDRATE in the last 72 hours. C-Reactive Protein No results for input(s): CRP in the last 72 hours.  Micro Results: Recent Results (from the past 720 hour(s))  Culture, blood (Routine X 2) w Reflex to ID Panel     Status: None   Collection Time: 07/09/15  1:30 PM  Result Value Ref Range Status   Specimen Description BLOOD RIGHT HAND  Final   Special Requests IN PEDIATRIC BOTTLE 1CC  Final   Culture  Setup Time   Final    GRAM NEGATIVE RODS AEROBIC BOTTLE ONLY CRITICAL RESULT CALLED TO, READ BACK BY AND VERIFIED WITH: F FLYNT,RN @0713  07/10/15 MKELLY    Culture SERRATIA MARCESCENS  Final   Report Status 07/12/2015 FINAL  Final   Organism ID, Bacteria SERRATIA MARCESCENS  Final      Susceptibility   Serratia marcescens - MIC*    CEFAZOLIN >=64 RESISTANT Resistant     CEFEPIME <=1 SENSITIVE Sensitive     CEFTAZIDIME <=1 SENSITIVE Sensitive     CEFTRIAXONE <=1 SENSITIVE Sensitive     CIPROFLOXACIN <=0.25 SENSITIVE Sensitive     GENTAMICIN <=1 SENSITIVE Sensitive     TRIMETH/SULFA <=20 SENSITIVE Sensitive     * SERRATIA MARCESCENS  Culture, blood (Routine X 2) w Reflex to ID Panel     Status: None   Collection Time: 07/09/15  1:40 PM  Result Value Ref Range Status   Specimen Description BLOOD RIGHT ARM  Final   Special Requests IN PEDIATRIC BOTTLE 2CC  Final   Culture  Setup Time   Final     GRAM NEGATIVE RODS AEROBIC BOTTLE ONLY CRITICAL RESULT CALLED TO, READ BACK BY AND VERIFIED WITH: F FLYNT,RN @0713  07/10/15 MKELLY    Culture   Final    SERRATIA MARCESCENS SUSCEPTIBILITIES PERFORMED ON PREVIOUS CULTURE WITHIN THE LAST 5 DAYS.    Report Status 07/12/2015 FINAL  Final  MRSA PCR Screening     Status: None  Collection Time: 07/09/15  2:02 PM  Result Value Ref Range Status   MRSA by PCR NEGATIVE NEGATIVE Final    Comment:        The GeneXpert MRSA Assay (FDA approved for NASAL specimens only), is one component of a comprehensive MRSA colonization surveillance program. It is not intended to diagnose MRSA infection nor to guide or monitor treatment for MRSA infections.   Culture, blood (Routine X 2) w Reflex to ID Panel     Status: None   Collection Time: 07/10/15  1:20 PM  Result Value Ref Range Status   Specimen Description BLOOD RIGHT HAND  Final   Special Requests IN PEDIATRIC BOTTLE .5CC  Final   Culture NO GROWTH 5 DAYS  Final   Report Status 07/15/2015 FINAL  Final  Culture, blood (Routine X 2) w Reflex to ID Panel     Status: None   Collection Time: 07/10/15  1:43 PM  Result Value Ref Range Status   Specimen Description BLOOD RIGHT HAND  Final   Special Requests IN PEDIATRIC BOTTLE 3CC  Final   Culture NO GROWTH 5 DAYS  Final   Report Status 07/15/2015 FINAL  Final    Studies/Results: Dg Chest Port 1 View  07/16/2015  CLINICAL DATA:  Status post ET tube placement today. EXAM: PORTABLE CHEST 1 VIEW COMPARISON:  Single view of the chest earlier today. FINDINGS: A new endotracheal tube is in place with the tip 1.6 cm above the carina. The tube could be withdrawn 1.5-2 cm for better positioning. Right PICC has been pulled back with tip now in the mid to lower superior vena cava. Bilateral airspace disease persists. No pneumothorax. Heart size is normal. IMPRESSION: ETT tip projects 1.6 cm above the carina. The tube could be withdrawn 1.5-2 cm for better  positioning. Right PICC has been withdrawn with the tip now in the lower superior vena cava. No change in bilateral airspace disease. Electronically Signed   By: Drusilla Kanner M.D.   On: 07/16/2015 10:02   Dg Chest Port 1 View  07/16/2015  CLINICAL DATA:  Shortness of breath. EXAM: PORTABLE CHEST 1 VIEW COMPARISON:  07/15/2015. 07/14/2015. 07/13/2015. CT abdomen 07/10/2015. FINDINGS: Right PICC line in stable position. Interim removal of left IJ line. Cardiomegaly with diffuse bilateral pulmonary infiltrates consistent with pulmonary edema again noted. No interim change. Low lung volumes with basilar atelectasis again noted, unchanged in appearance. Lucencies noted over the hemidiaphragms most likely related to underlying lung and ascites noted on prior CT. Finding unchanged. Small bilateral pleural effusions. No pneumothorax. IMPRESSION: 1. Interim removal of left IJ line. Right PICC line stable position. 2. Cardiomegaly was persistent unchanged bilateral pulmonary infiltrates consistent with pulmonary edema. Tiny pleural effusions cannot be excluded. Electronically Signed   By: Maisie Fus  Register   On: 07/16/2015 07:54   Dg Chest Port 1 View  07/15/2015  CLINICAL DATA:  Line placement. EXAM: PORTABLE CHEST 1 VIEW COMPARISON:  Earlier today at 755 hours. FINDINGS: 1907 hours. Left internal jugular line unchanged. Right-sided PICC line versus less likely subclavian central line terminates at the low SVC versus cavoatrial junction. No pneumothorax. Patient rotated right. Cardiomegaly accentuated by AP portable technique. No pleural fluid. Interstitial edema is similar, moderate. Bibasilar airspace disease is not significantly changed. IMPRESSION: New right-sided line, terminating at the low SVC versus cavoatrial junction. No pneumothorax. Similar interstitial edema with bibasilar Airspace disease, likely atelectasis. Electronically Signed   By: Jeronimo Greaves M.D.   On: 07/15/2015 19:19  Dg Chest Port 1  View  07/15/2015  CLINICAL DATA:  Pulmonary edema EXAM: PORTABLE CHEST 1 VIEW COMPARISON:  07/14/2015 FINDINGS: Cardiomediastinal silhouette is stable. There is worsening in aeration with bilateral airspace opacities and interstitial prominence consistent with worsening pulmonary edema. Left IJ central line with tip in right atrium is unchanged in position. There is no pneumothorax. Degenerative changes bilateral shoulders. IMPRESSION: Bilateral mild pulmonary edema with slight worsening from prior exam. Stable left IJ central line position. No pneumothorax. Electronically Signed   By: Natasha Mead M.D.   On: 07/15/2015 08:09      Assessment/Plan:  INTERVAL HISTORY:   07/09/15: patient with worsening abdominal pain, difficulty breathing transfer to ICU for NIPPV 07/10/15: Blood cultures with 2/2 GNR 07/11/15: PICC removed, CT without abscess or necroisis in pancreas 07/12/15: Blood culture showed Serratia yet again., Patient decompensated with pulmonary edema today requiring more aggressive diuresis  2/25--2/27: pt still being diuresed now off bipap 07/16/15: intubated for hypoxemic resp failure but wonder if he could have hypercapnea as well?    07/12/15 Principal Problem:   Acute pancreatitis Active Problems:   Crohn's disease (HCC)   Atypical mycobacterial infection of hand   AKI (acute kidney injury) (HCC)   PICC (peripherally inserted central catheter) in place   Chronic adrenal insufficiency (HCC)   Epigastric abdominal pain   Non-intractable cyclical vomiting with nausea   Anorexia   Abnormal loss of weight   Crohn's disease involving terminal ileum (HCC)   Crohn disease (HCC)   Elevated LFTs   Osteomyelitis of right wrist (HCC)   Osteomyelitis of left wrist (HCC)   Pyogenic arthritis of left wrist (HCC)   Malnutrition of moderate degree   Acute hypoxemic respiratory failure (HCC)   Sinus tachycardia (HCC)   Sepsis (HCC)   Acute pulmonary edema (HCC)   Lactic acidosis    History of small bowel obstruction   ARDS (adult respiratory distress syndrome) (HCC)   Septic shock (HCC)   PICC line infection   Gram negative sepsis (HCC)   Bacterial infection due to Serratia   Encounter for central line placement    Lee Klein is a 65 y.o. 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 He is admitted for possible pancreatitis with an elevated lipase in the setting of nausea and vomiting. We had stopped his tigecycline yesterday in hopes that this was potentially the culprit antibiotic for causing his nausea vomiting and abdominal pain. Unfortunately his abdominal pain worsened and he developed sepsis from GNR. CT shows possible pancreatic pseudo-cyst but no necrosis and no intra-abdominal abscesses He had recurrent Serratia bacteremia  #1 Gram negative sepsis with recurrent Serratia bacteremia likely due to PICC line never being removed during last admission  IJ is out that was when he was bacteremic and he has a new PICC line placed yesterday  I would continue the ceftriaxone he is on currently while he is still needing IV acess  AT MINIMUM I would give him 2 weeksof effective anti Serratia systemic antibiotics with today being day #1 (first day post IJ removal)  If he can get ALL lines out this could be completed with bioavailable ciprofloxacin  #2 Respiratory failure hypoxemic ? If also hypercapnic given confusion. There is not any apparent HCAP more pulmonary edema  #3Nausea and vomiting with pancreatitis. Hepatitis: --  going to watch the patient off his antimycobacterial drugs and hope we'll continue to improve hopefully won't need TPN for very long  #4 thrombocytopenia: stable  #5 Mycobacterium abscesses osteomyelitis and septic arthritis.: We are going to take a "holiday"  from his anti-Mycobacterium antibiotics for now. Hopefully if he recovers from his acute illness and we can then revisit whether to rechallenge him again with these antibiotics or observe him off of them.  Dr. Orvan Falconer is taking over the service tomorrow.    LOS: 11 days   Acey Lav 07/16/2015, 12:56 PM

## 2015-07-16 NOTE — Progress Notes (Signed)
Cortrak Tube Team Note:  Consult received to place Cortrak feeding tube.  Noted INR 2.1 Dr Kendrick Fries notified and wants to proceed with tube placement.  Tube placement documented in LDA.   Kendell Bane RD, LDN, CNSC [phone] Pager [phone] After Hours Pager

## 2015-07-16 NOTE — Procedures (Signed)
Intubation Procedure Note Lee Klein [id number] [birth year]  Procedure: Intubation Indications: Airway protection and maintenance  Procedure Details Consent: Risks of procedure as well as the alternatives and risks of each were explained to the (patient/caregiver).  Consent for procedure obtained. Time Out: Verified patient identification, verified procedure, site/side was marked, verified correct patient position, special equipment/implants available, medications/allergies/relevent history reviewed, required imaging and test results available.  Performed  Drugs Etomidate 20mg  Rocuronium 70mg  IV DL x 1 with MAC 3 blade Grade 1 view 8.0 ET tube passed through cords under direct visualization Placement confirmed with bilateral breath sounds, positive EtCO2 change and smoke in tube   Evaluation Hemodynamic Status: BP stable throughout; O2 sats: stable throughout Patient's Current Condition: stable Complications: No apparent complications Patient did tolerate procedure well. Chest X-ray ordered to verify placement.  CXR: pending.   [name] 07/16/2015

## 2015-07-16 NOTE — Telephone Encounter (Signed)
[name], the pt's husband wants to give Tammy, Dr. Patty Sermons nurse an update on Mr. Pitter. Please give her a phone call.   [name]'s ph# [phone] Thank you.

## 2015-07-16 NOTE — Care Management Note (Signed)
Case Management Note  Patient Details  Name: Lee Klein MRN: [id number] Date of Birth: 1950-10-16  Subjective/Objective:      Pt transferred to ICU for respiratory distress requiring BIPAP     Action/Plan:    07/16/2015  Pt had to be intubated today due to increased resp distress  07/12/15  Pt remains on BIPAP - unable to wean, pt is on; TNA, IV lasix, IV solumedrol, IV antibiotics for serretia bacteremia.  CM will continue to monitor for disposition needs  07/09/15 Pt discharged home in December 2016 with [facility] for antibiotics (IV Tygacil).  Pt is from home at wife [name] (RN), pt also has home O2 with [facility].  CM contacted agency and informed of admit.  Expected Discharge Date:                  Expected Discharge Plan:  Home w Home Health Services  In-House Referral:     Discharge planning Services  CM Consult  Post Acute Care Choice:  Resumption of Svcs/PTA Provider Choice offered to:  Patient  DME Arranged:    DME Agency:     HH Arranged:  RN HH Agency:  [facility]  Status of Service:  In process, will continue to follow  Medicare Important Message Given:    Date Medicare IM Given:    Medicare IM give by:    Date Additional Medicare IM Given:    Additional Medicare Important Message give by:     If discussed at Long Length of Stay Meetings, dates discussed:    Additional Comments:  Cherylann Parr, RN 07/16/2015, 12:09 PM

## 2015-07-17 LAB — POCT I-STAT 3, ART BLOOD GAS (G3+)
ACID-BASE EXCESS: 14 mmol/L — AB (ref 0.0–2.0)
BICARBONATE: 38.4 meq/L — AB (ref 20.0–24.0)
O2 SAT: 95 %
PO2 ART: 64 mmHg — AB (ref 80.0–100.0)
TCO2: 40 mmol/L (ref 0–100)
pCO2 arterial: 44.9 mmHg (ref 35.0–45.0)
pH, Arterial: 7.538 — ABNORMAL HIGH (ref 7.350–7.450)

## 2015-07-17 LAB — RENAL FUNCTION PANEL
ALBUMIN: 1.2 g/dL — AB (ref 3.5–5.0)
Anion gap: 10 (ref 5–15)
BUN: 33 mg/dL — AB (ref 6–20)
CALCIUM: 7.8 mg/dL — AB (ref 8.9–10.3)
CO2: 36 mmol/L — ABNORMAL HIGH (ref 22–32)
CREATININE: 1.51 mg/dL — AB (ref 0.61–1.24)
Chloride: 103 mmol/L (ref 101–111)
GFR calc Af Amer: 55 mL/min — ABNORMAL LOW (ref >60)
GFR, EST NON AFRICAN AMERICAN: 48 mL/min — AB (ref >60)
GLUCOSE: 123 mg/dL — AB (ref 65–99)
PHOSPHORUS: 3.1 mg/dL (ref 2.5–4.6)
Potassium: 3.3 mmol/L — ABNORMAL LOW (ref 3.5–5.1)
SODIUM: 149 mmol/L — AB (ref 135–145)

## 2015-07-17 LAB — GLUCOSE, CAPILLARY
GLUCOSE-CAPILLARY: 117 mg/dL — AB (ref 65–99)
GLUCOSE-CAPILLARY: 150 mg/dL — AB (ref 65–99)
Glucose-Capillary: 108 mg/dL — ABNORMAL HIGH (ref 65–99)
Glucose-Capillary: 118 mg/dL — ABNORMAL HIGH (ref 65–99)
Glucose-Capillary: 128 mg/dL — ABNORMAL HIGH (ref 65–99)
Glucose-Capillary: 113 mg/dL — ABNORMAL HIGH (ref 65–99)

## 2015-07-17 MED ORDER — PANTOPRAZOLE SODIUM 40 MG PO PACK
40.0000 mg | PACK | Freq: Every day | ORAL | Status: DC
  Administered 2015-07-17 – 2015-07-31 (×13): 40 mg
  Filled 2015-07-17 (×29): qty 20

## 2015-07-17 MED ORDER — ANTISEPTIC ORAL RINSE SOLUTION (CORINZ)
7.0000 mL | Freq: Four times a day (QID) | OROMUCOSAL | Status: DC
  Administered 2015-07-17 – 2015-07-31 (×56): 7 mL via OROMUCOSAL

## 2015-07-17 MED ORDER — METOPROLOL TARTRATE 1 MG/ML IV SOLN
2.5000 mg | Freq: Four times a day (QID) | INTRAVENOUS | Status: DC
  Administered 2015-07-17 – 2015-07-20 (×12): 2.5 mg via INTRAVENOUS
  Filled 2015-07-17 (×15): qty 5

## 2015-07-17 MED ORDER — HEPARIN SODIUM (PORCINE) 5000 UNIT/ML IJ SOLN
5000.0000 [IU] | Freq: Three times a day (TID) | INTRAMUSCULAR | Status: DC
  Administered 2015-07-17 – 2015-07-23 (×19): 5000 [IU] via SUBCUTANEOUS
  Filled 2015-07-17 (×23): qty 1

## 2015-07-17 MED ORDER — FREE WATER
200.0000 mL | Freq: Four times a day (QID) | Status: DC
  Administered 2015-07-17 – 2015-07-18 (×5): 200 mL

## 2015-07-17 MED ORDER — CHLORHEXIDINE GLUCONATE 0.12% ORAL RINSE (MEDLINE KIT)
15.0000 mL | Freq: Two times a day (BID) | OROMUCOSAL | Status: DC
  Administered 2015-07-17 – 2015-07-31 (×28): 15 mL via OROMUCOSAL

## 2015-07-17 MED ORDER — POTASSIUM CHLORIDE 10 MEQ/50ML IV SOLN
10.0000 meq | INTRAVENOUS | Status: AC
  Administered 2015-07-17 (×4): 10 meq via INTRAVENOUS
  Filled 2015-07-17 (×4): qty 50

## 2015-07-17 NOTE — Progress Notes (Signed)
[facility] attempted but unsuccessful, Dr. Kendrick Fries notified.

## 2015-07-17 NOTE — Progress Notes (Signed)
Patient ID: Lee Klein, male   DOB: 1950/12/08, 65 y.o.   MRN: [id number]         [facility]    Date of Admission:  06/30/2015   Total days of antibiotics 9        Day 5 ceftriaxone         Principal Problem:   Acute pancreatitis Active Problems:   Atypical mycobacterial infection of hand   Crohn's disease (HCC)   AKI (acute kidney injury) (HCC)   PICC (peripherally inserted central catheter) in place   Chronic adrenal insufficiency (HCC)   Epigastric abdominal pain   Non-intractable cyclical vomiting with nausea   Anorexia   Abnormal loss of weight   Crohn's disease involving terminal ileum (HCC)   Crohn disease (HCC)   Elevated LFTs   Osteomyelitis of right wrist (HCC)   Osteomyelitis of left wrist (HCC)   Pyogenic arthritis of left wrist (HCC)   Malnutrition of moderate degree   Acute hypoxemic respiratory failure (HCC)   Sinus tachycardia (HCC)   Sepsis (HCC)   Acute pulmonary edema (HCC)   Lactic acidosis   History of small bowel obstruction   ARDS (adult respiratory distress syndrome) (HCC)   Septic shock (HCC)   PICC line infection   Gram negative sepsis (HCC)   Bacterial infection due to Serratia   Encounter for central line placement   . antiseptic oral rinse  7 mL Mouth Rinse q12n4p  . antiseptic oral rinse  7 mL Mouth Rinse QID  . cefTRIAXone (ROCEPHIN)  IV  2 g Intravenous Q24H  . chlorhexidine gluconate  15 mL Mouth Rinse BID  . feeding supplement (PRO-STAT SUGAR FREE 64)  30 mL Per Tube BID  . furosemide  80 mg Intravenous BID  . hydrocortisone sod succinate (SOLU-CORTEF) inj  20 mg Intravenous QPM  . hydrocortisone sod succinate (SOLU-CORTEF) inj  40 mg Intravenous QAC breakfast  . insulin aspart  0-9 Units Subcutaneous 6 times per day  . pantoprazole (PROTONIX) IV  40 mg Intravenous Q24H  . pneumococcal 23 valent vaccine  0.5 mL Intramuscular Tomorrow-1000  . potassium chloride  10 mEq Intravenous Q1 Hr x 4  .  sodium chloride  1,000 mL Intravenous Once  . sodium chloride flush  3 mL Intravenous Q12H   Review of Systems: Review of Systems  Unable to perform ROS: intubated    Past Medical History  Diagnosis Date  . Crohn's disease (HCC)   . GERD (gastroesophageal reflux disease)   . Anxiety   . 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 PICC (peripherally inserted central catheter) in place     right arm  . Osteomyelitis of hand, left, acute (HCC)   . Hearing loss   . Pancreatitis 10/2014  . History of kidney stones   . History of pancreatitis 11/06/2014  . Mycobacterial infection     left hand/wrist  . Thin skin   . Septic arthritis of wrist, left (HCC)   . DVT (deep venous thrombosis) (HCC) 04/2015    left leg  . Pneumonia 04/2015  . Acute pancreatitis 07/08/2015  . AKI (acute kidney injury) (HCC) 07/08/2015  . PICC (peripherally inserted central catheter) in place     Social History  Substance Use Topics  . Smoking status: Former Smoker -- 30 years    Quit date: 05/31/2006  . Smokeless tobacco: Never Used  . Alcohol Use: 0.0 oz/week    0  Standard drinks or equivalent per week     Comment: rare    History reviewed. No pertinent family history. Allergies  Allergen Reactions  . Amikacin Other (See Comments)    CAUSED DEAFNESS  . Cefoxitin Nausea Only    ABD. PAIN  . Fish Allergy Nausea And Vomiting  . Iodine Nausea Only    JOINT PAIN  . Ivp Dye [Iodinated Diagnostic Agents] Nausea Only    JOINT PAIN  . Rifampin Nausea And Vomiting and Other (See Comments)    CHILLS  . Shellfish Allergy Swelling  . Vancomycin Other (See Comments)    Red Man Syndrome - was told that it was given to fast.  . Demerol Rash    OBJECTIVE: Filed Vitals:   07/17/15 0700 07/17/15 0742 07/17/15 0758 07/17/15 0900  BP: 143/87  173/106 128/73  Pulse: 101  123 92  Temp:  98.2 F (36.8 C)    TempSrc:  Oral    Resp: 13  14 14   Height:      Weight:      SpO2:  94%  91% 95%   Body mass index is 21.64 kg/(m^2).  Physical Exam  Constitutional:  He remains sedated and on the ventilator.  Cardiovascular: Regular rhythm.   No murmur heard. Tachycardic.  Pulmonary/Chest:  Clear anteriorly. He continues to have tachycardia and oxygen desaturation. Nurse reports that he is not requiring much suctioning.  Abdominal: Soft.  Musculoskeletal:  Both hands and restraint mitts.  Skin: No rash noted.    Lab Results Lab Results  Component Value Date   WBC 19.4* 07/16/2015   HGB 9.8* 07/16/2015   HCT 30.9* 07/16/2015   MCV 100.0 07/16/2015   PLT 56* 07/16/2015    Lab Results  Component Value Date   CREATININE 1.51* 07/17/2015   BUN 33* 07/17/2015   NA 149* 07/17/2015   K 3.3* 07/17/2015   CL 103 07/17/2015   CO2 36* 07/17/2015    Lab Results  Component Value Date   ALT 48 07/11/2015   AST 62* 07/11/2015   ALKPHOS 200* 07/11/2015   BILITOT 1.6* 07/11/2015     Microbiology: Recent Results (from the past 240 hour(s))  Culture, blood (Routine X 2) w Reflex to ID Panel     Status: None   Collection Time: 07/09/15  1:30 PM  Result Value Ref Range Status   Specimen Description BLOOD RIGHT HAND  Final   Special Requests IN PEDIATRIC BOTTLE 1CC  Final   Culture  Setup Time   Final    GRAM NEGATIVE RODS AEROBIC BOTTLE ONLY CRITICAL RESULT CALLED TO, READ BACK BY AND VERIFIED WITH: F FLYNT,RN @0713  07/10/15 MKELLY    Culture SERRATIA MARCESCENS  Final   Report Status 07/12/2015 FINAL  Final   Organism ID, Bacteria SERRATIA MARCESCENS  Final      Susceptibility   Serratia marcescens - MIC*    CEFAZOLIN >=64 RESISTANT Resistant     CEFEPIME <=1 SENSITIVE Sensitive     CEFTAZIDIME <=1 SENSITIVE Sensitive     CEFTRIAXONE <=1 SENSITIVE Sensitive     CIPROFLOXACIN <=0.25 SENSITIVE Sensitive     GENTAMICIN <=1 SENSITIVE Sensitive     TRIMETH/SULFA <=20 SENSITIVE Sensitive     * SERRATIA MARCESCENS  Culture, blood (Routine X 2) w Reflex to  ID Panel     Status: None   Collection Time: 07/09/15  1:40 PM  Result Value Ref Range Status   Specimen Description BLOOD RIGHT ARM  Final   Special  Requests IN PEDIATRIC BOTTLE 2CC  Final   Culture  Setup Time   Final    GRAM NEGATIVE RODS AEROBIC BOTTLE ONLY CRITICAL RESULT CALLED TO, READ BACK BY AND VERIFIED WITH: F FLYNT,RN  07/10/15 MKELLY    Culture   Final    SERRATIA MARCESCENS SUSCEPTIBILITIES PERFORMED ON PREVIOUS CULTURE WITHIN THE LAST 5 DAYS.    Report Status 07/12/2015 FINAL  Final  MRSA PCR Screening     Status: None   Collection Time: 07/09/15  2:02 PM  Result Value Ref Range Status   MRSA by PCR NEGATIVE NEGATIVE Final    Comment:        The GeneXpert MRSA Assay (FDA approved for NASAL specimens only), is one component of a comprehensive MRSA colonization surveillance program. It is not intended to diagnose MRSA infection nor to guide or monitor treatment for MRSA infections.   Culture, blood (Routine X 2) w Reflex to ID Panel     Status: None   Collection Time: 07/10/15  1:20 PM  Result Value Ref Range Status   Specimen Description BLOOD RIGHT HAND  Final   Special Requests IN PEDIATRIC BOTTLE .5CC  Final   Culture NO GROWTH 5 DAYS  Final   Report Status 07/15/2015 FINAL  Final  Culture, blood (Routine X 2) w Reflex to ID Panel     Status: None   Collection Time: 07/10/15  1:43 PM  Result Value Ref Range Status   Specimen Description BLOOD RIGHT HAND  Final   Special Requests IN PEDIATRIC BOTTLE 3CC  Final   Culture NO GROWTH 5 DAYS  Final   Report Status 07/15/2015 FINAL  Final     ASSESSMENT: He had recurrent Serratia bacteremia. My partner, Dr. Daiva Eves, recommended 2 more weeks of antibiotic therapy starting yesterday after removal of his potentially contaminated central line. He required intubation yesterday. Chest x-ray showed what appears to be pulmonary edema. He is currently not febrile. I will watch closely to see if he develops other  signs or symptoms of HCAP.  PLAN: 1. Continue ceftriaxone 13 more days through 07/29/2015  Cliffton Asters, MD [facility] [phone] pager   [phone] cell 07/17/2015, 10:12 AM

## 2015-07-17 NOTE — Progress Notes (Signed)
eLink Physician-Brief Progress Note Patient Name: Lee Klein DOB: [birth year] MRN: [id number]   Date of Service  07/17/2015  HPI/Events of Note  Multiple issues: 1. ABG on 60%/PRVC 15/TV 560/P 10 = 7.49/45/125/35 and 2. Needs ventilator orders written.   eICU Interventions  Will order: 1. Ventilator settings: 40%/PRVC 12/TV 560/P 10. 2. ABG at 5 AM.      Intervention Category Major Interventions: Respiratory failure - evaluation and management  [name] 07/17/2015, 2:17 AM

## 2015-07-17 NOTE — Telephone Encounter (Signed)
Talked with [name]. She gave a progress report on [place]. I have shared with Dr.rehman.

## 2015-07-17 NOTE — Telephone Encounter (Signed)
Patient was called,message left on her voice mail to call me back.

## 2015-07-17 NOTE — Progress Notes (Signed)
PULMONARY / CRITICAL CARE MEDICINE   Name: Lee Klein MRN: [id number] DOB: Mar 23, 1951    ADMISSION DATE:  2015-07-22 CONSULTATION DATE:  07/09/15  REFERRING MD:  Dr. Oswaldo Done /  Internal Medicine  CHIEF COMPLAINT:  Abdominal pain and vomiting   BRIEF PATIENT DESCRIPTION : The patient was admitted for acute on chronic nausea and vomiting. He has Crohn's disease. Also with significant comorbidities. 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 He was treated as drug-induced pancreatitis. GI and ID have been following. He had lactic acidosis. Had fluid resuscitation. Had resp distress on 2/21. Chest x-ray with bilateral infiltrates, new from admission. He went into respiratory distress. O2 sats 60% on non breather mask. Transferred to ICU.  ABG prior to transfer was 7.26, PCO2 33, PO2 48 on 100% nonrebreather mask. He received 80 mg IV Lasix. Patient is 12 L positive since admission.  He was intubated on 2/28 for prorgressive respiratory failure.  SIGNIFICANT EVENTS:  July 22, 2015 admitted for nausea vomiting, possible drug-induced pancreatitis 07/09/15 went into resp distress: O2 sats 60% on NRBM; transferred to ICU  07/12/15 marked fluid overload unable to wean off bipap 07/16/15 Intubated  STUDIES : [name] Abd (2/17) >> Heterogeneous echogenicity of pancreas which could due pancreatic inflammation; small ascites; fatty liver 2D echo (05/2014) >> EF 50%, CHFpEF CT Abd/pelvis - negative for pancreatic necrosis or abscess or complication related to Crohn's disease. New moderate bilateral pleural effusions. New moderate volume of abdominal ascites. 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  Echo 2/26 - normal EF, G1DD  IMAGING : CXR (2/17) >> no active dse CXR (2/21) >> acute B infiltrates CXR 2/26 >> Improving B/L infiltrates.   CULTURES: Blood culture (2/21) >> GNR, Serratia BCx (2/22) >> NGTD  ANTIBIOTICS :  Clofazimine 2/17 >> 2/21 Azithromycin  2/17 >> 2/21 Tigecycline 2/17 -2/20 Vanc 2/21>> 2/22 Meropenem 2/21>>2/24 Ceftazidine 2/24>> 2/25 Ceftriaxone 2/25 >>  LINES:  R PICC --removed LIJ 2/21>> 2/27 L PICC 2/27 >  Right ileostomy   SUBJECTIVE:  Intubated yesterday  VITAL SIGNS: BP 117/70 mmHg  Pulse 106  Temp(Src) 98.2 F (36.8 C) (Oral)  Resp 17  Ht 5\' 10"  (1.778 m)  Wt 68.4 kg (150 lb 12.7 oz)  BMI 21.64 kg/m2  SpO2 92%  HEMODYNAMICS:    VENTILATOR SETTINGS: Vent Mode:  [-] PRVC FiO2 (%):  [40 %-60 %] 50 % Set Rate:  [12 bmp-15 bmp] 12 bmp Vt Set:  [560 mL] 560 mL PEEP:  [10 cmH20] 10 cmH20 Plateau Pressure:  [25 cmH20-39 cmH20] 25 cmH20  INTAKE / OUTPUT: I/O last 3 completed shifts: In: 1945.5 [I.V.:1015.1; NG/GT:430.4; IV Piggyback:500] Out: 2630 [Urine:2430; Stool:200]   PHYSICAL EXAMINATION:  General: sedated on vent HENT: NCAT ETT in place PULM: surprisingly clear, vent supported breaths CV: RRR, no mgr GI: ostomy in place Derm: no real edema, bruising throughout Neuro: sedated on vent for my exam  LABS:  BMET  Recent Labs Lab 07/16/15 0240 07/16/15 1345 07/17/15 0400  NA 150* 149* 149*  K 3.0* 4.4 3.3*  CL 103 105 103  CO2 36* 35* 36*  BUN 30* 28* 33*  CREATININE 1.31* 1.47* 1.51*  GLUCOSE 93 153* 123*    Electrolytes  Recent Labs Lab 07/11/15 0627  07/15/15 0318  07/16/15 0240 07/16/15 1345 07/17/15 0400  CALCIUM 7.8*  < > 7.6*  < > 7.8* 7.6* 7.8*  MG 1.9  --  1.6*  --  1.9  --   --  PHOS 3.0  < > 2.5  --  3.3  3.3  --  3.1  < > = values in this interval not displayed.  CBC  Recent Labs Lab 07/14/15 0450 07/15/15 0318 07/16/15 0240  WBC 17.6* 16.3* 19.4*  HGB 9.6* 9.5* 9.8*  HCT 30.3* 29.2* 30.9*  PLT 43* 52* 56*    Coag's No results for input(s): APTT, INR in the last 168 hours.  Sepsis Markers  Recent Labs Lab 07/10/15 1308 07/11/15 0638  LATICACIDVEN 3.6* 2.8*    ABG  Recent Labs Lab 07/16/15 1310 07/17/15 0340  PHART 7.495*  7.538*  PCO2ART 45.5* 44.9  PO2ART 125* 64.0*    Liver Enzymes  Recent Labs Lab 07/11/15 0627  07/15/15 0318 07/16/15 0240 07/17/15 0400  AST 62*  --   --   --   --   ALT 48  --   --   --   --   ALKPHOS 200*  --   --   --   --   BILITOT 1.6*  --   --   --   --   ALBUMIN 1.3*  < > 1.2* 1.3* 1.2*  < > = values in this interval not displayed.  Cardiac Enzymes No results for input(s): TROPONINI, PROBNP in the last 168 hours.  Glucose  Recent Labs Lab 07/16/15 1138 07/16/15 1524 07/16/15 2008 07/16/15 2350 07/17/15 0350 07/17/15 0739  GLUCAP 68 131* 133* 117* 108* 118*    Imaging Dg Abd Portable 1v  07/16/2015  CLINICAL DATA:  NG tube placement EXAM: PORTABLE ABDOMEN - 1 VIEW COMPARISON:  07/10/2015 FINDINGS: Surgical clips are noted in right mid abdomen. There is normal small bowel gas pattern. Ileostomy is noted in right lower quadrant. There is NG tube with tip in distal stomach/ pyloric region. IMPRESSION: NG tube in place with tip in distal stomach/pyloric region. Electronically Signed   By: Natasha Mead M.D.   On: 07/16/2015 13:39    DISCUSSION: 65 y/o male with Crohn's disease, long standing protein calorie malnutrition, ostomy who was admitted on 2/21 with acute pancreatitis and has struggled with acute respiratory failure with hypoxemia likely due to ARDS and pulmonary edema.  Intubated 2/28.  ASSESSMENT / PLAN:  PULMONARY A:  Acute hypoxemic respiratory failure secondary: Pulmonary edema, ARDS Wife questions dysphagia  P:   Full vent support Titrate PEEP/FiO2 to maintain O2 saturation > 90% SLP after extubation  CARDIOVASCULAR A:  Diastolic dysfunction based on 2248/2500 echo, EF nml. Not known to have CAD.  P:  Hold lasix Continue Lopressor prn for HR and pressures Scheduled lopressor IV  RENAL A:   Acute kidney injury> 3/1 volume deplete Metabolic acidosis. resolved Hypokalemia P:   Hold lasix Monitor BMET and UOP Replace electrolytes as  needed   GASTROINTESTINAL A:  Crohn's disease requiring steroids Drug-induced pancreatitis (Tigecycline)  - Lipase improved Chronic nausea, vomiting History of antibiotic related diarrhea Severe malnutrition P:   Continue tube feedings Continue stress ulcer prophylaxis  HEMATOLOGIC A:   Thrombocytopenia, chronic, stable H/O left lower extremity DVT in October 2016. By history, unprovoked. He was on Xarelto outpatient. 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 Abx for 1 yr. Serratia bacteremia -PICC on admission likely  source P:   Abx as above. Narrowed to ceftriaxone as per ID ID following - drug holiday for M. abscessus IJ removed 2/27 - so continue Abx until 2/13.  ENDOCRINE A:  Chronic adrenal insufficiency  P:   Continue IV steroids Monitor sugars SSI  NEUROLOGIC A:  Anxiety, chronic Sedation needs for vent synchrony P:   RASS goal: -1 Fentanyl gtt  FAMILY  - Updates: Updated wife at bedside   My cc time 40 minutes  Heber Fairplay, MD [facility] Pager: [phone] Cell: [phone] After 3pm or if no response, call [phone]

## 2015-07-17 NOTE — Progress Notes (Signed)
eLink Physician-Brief Progress Note Patient Name: Lee Klein DOB: February 06, 1951 MRN: [id number]   Date of Service  07/17/2015  HPI/Events of Note  K+ = 3.3 and Creatinine = 1.51.  eICU Interventions  Will replace K+.     Intervention Category Intermediate Interventions: Electrolyte abnormality - evaluation and management  Sommer,Steven Eugene 07/17/2015, 6:04 AM

## 2015-07-17 DEATH — deceased

## 2015-07-18 ENCOUNTER — Inpatient Hospital Stay (HOSPITAL_COMMUNITY): Payer: Managed Care, Other (non HMO)

## 2015-07-18 DIAGNOSIS — E870 Hyperosmolality and hypernatremia: Secondary | ICD-10-CM | POA: Insufficient documentation

## 2015-07-18 LAB — GLUCOSE, CAPILLARY
GLUCOSE-CAPILLARY: 137 mg/dL — AB (ref 65–99)
GLUCOSE-CAPILLARY: 124 mg/dL — AB (ref 65–99)
GLUCOSE-CAPILLARY: 138 mg/dL — AB (ref 65–99)
Glucose-Capillary: 153 mg/dL — ABNORMAL HIGH (ref 65–99)
Glucose-Capillary: 128 mg/dL — ABNORMAL HIGH (ref 65–99)
Glucose-Capillary: 102 mg/dL — ABNORMAL HIGH (ref 65–99)

## 2015-07-18 LAB — CBC WITH DIFFERENTIAL/PLATELET
BASOS PCT: 0 %
Basophils Absolute: 0 10*3/uL (ref 0.0–0.1)
EOS PCT: 0 %
Eosinophils Absolute: 0 10*3/uL (ref 0.0–0.7)
HEMATOCRIT: 29.6 % — AB (ref 39.0–52.0)
HEMOGLOBIN: 9.1 g/dL — AB (ref 13.0–17.0)
LYMPHS ABS: 0.3 10*3/uL — AB (ref 0.7–4.0)
Lymphocytes Relative: 1 %
MCH: 31.5 pg (ref 26.0–34.0)
MCHC: 30.7 g/dL (ref 30.0–36.0)
MCV: 102.4 fL — AB (ref 78.0–100.0)
MONO ABS: 0.9 10*3/uL (ref 0.1–1.0)
MONOS PCT: 3 %
Neutro Abs: 28.5 10*3/uL — ABNORMAL HIGH (ref 1.7–7.7)
Neutrophils Relative %: 96 %
Platelets: 84 10*3/uL — ABNORMAL LOW (ref 150–400)
RBC: 2.89 MIL/uL — AB (ref 4.22–5.81)
RDW: 16 % — AB (ref 11.5–15.5)
WBC: 29.7 10*3/uL — ABNORMAL HIGH (ref 4.0–10.5)

## 2015-07-18 LAB — BASIC METABOLIC PANEL
Anion gap: 9 (ref 5–15)
BUN: 48 mg/dL — AB (ref 6–20)
CHLORIDE: 107 mmol/L (ref 101–111)
CO2: 34 mmol/L — AB (ref 22–32)
CREATININE: 2.05 mg/dL — AB (ref 0.61–1.24)
Calcium: 7.9 mg/dL — ABNORMAL LOW (ref 8.9–10.3)
GFR calc Af Amer: 38 mL/min — ABNORMAL LOW (ref >60)
GFR calc non Af Amer: 33 mL/min — ABNORMAL LOW (ref >60)
GLUCOSE: 149 mg/dL — AB (ref 65–99)
POTASSIUM: 4.2 mmol/L (ref 3.5–5.1)
Sodium: 150 mmol/L — ABNORMAL HIGH (ref 135–145)

## 2015-07-18 MED ORDER — SODIUM CHLORIDE 0.9 % IV BOLUS (SEPSIS)
500.0000 mL | Freq: Once | INTRAVENOUS | Status: AC
  Administered 2015-07-18: 500 mL via INTRAVENOUS

## 2015-07-18 MED ORDER — HYDROCORTISONE 1 % EX CREA
TOPICAL_CREAM | CUTANEOUS | Status: DC | PRN
  Administered 2015-07-18: 13:00:00 via TOPICAL
  Filled 2015-07-18: qty 28

## 2015-07-18 MED ORDER — LEVALBUTEROL HCL 0.63 MG/3ML IN NEBU: 0.6300 mg | INHALATION_SOLUTION | Freq: Three times a day (TID) | RESPIRATORY_TRACT | Status: DC

## 2015-07-18 MED ORDER — NYSTATIN 100000 UNIT/GM EX POWD
Freq: Two times a day (BID) | CUTANEOUS | Status: DC
  Administered 2015-07-18 – 2015-07-27 (×18): via TOPICAL
  Administered 2015-07-27: 1 g via TOPICAL
  Administered 2015-07-28: 22:00:00 via TOPICAL
  Administered 2015-07-28: 1 g via TOPICAL
  Administered 2015-07-29 – 2015-07-31 (×5): via TOPICAL
  Filled 2015-07-18 (×4): qty 15

## 2015-07-18 MED ORDER — FREE WATER
300.0000 mL | Status: DC
  Administered 2015-07-18 – 2015-07-21 (×14): 300 mL

## 2015-07-18 MED ORDER — LEVALBUTEROL HCL 0.63 MG/3ML IN NEBU
0.6300 mg | INHALATION_SOLUTION | Freq: Three times a day (TID) | RESPIRATORY_TRACT | Status: DC
  Administered 2015-07-18 – 2015-07-31 (×38): 0.63 mg via RESPIRATORY_TRACT
  Filled 2015-07-18 (×72): qty 3

## 2015-07-18 NOTE — Progress Notes (Signed)
PULMONARY / CRITICAL CARE MEDICINE   Name: Lee Klein MRN: [id number] DOB: 04/18/1951    ADMISSION DATE:  07/15/2015 CONSULTATION DATE:  07/09/15  REFERRING MD:  Dr. Oswaldo Done /  Internal Medicine  CHIEF COMPLAINT:  Abdominal pain and vomiting   BRIEF PATIENT DESCRIPTION : The patient was admitted for acute on chronic nausea and vomiting. He has Crohn's disease. Also with significant comorbidities. 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 He was treated as drug-induced pancreatitis. GI and ID have been following. He had lactic acidosis. Had fluid resuscitation. Had resp distress on 2/21. Chest x-ray with bilateral infiltrates, new from admission. He went into respiratory distress. O2 sats 60% on non breather mask. Transferred to ICU.  ABG prior to transfer was 7.26, PCO2 33, PO2 48 on 100% nonrebreather mask. He received 80 mg IV Lasix. He was intubated on 2/28 for prorgressive respiratory failure.  SIGNIFICANT EVENTS:  06/29/2015 admitted for nausea vomiting, possible drug-induced pancreatitis 07/09/15 went into resp distress: O2 sats 60% on NRBM; transferred to ICU  07/12/15 marked fluid overload unable to wean off bipap 07/16/15 Intubated  STUDIES : [name] Abd (2/17) >> Heterogeneous echogenicity of pancreas which could due pancreatic inflammation; small ascites; fatty liver 2D echo (05/2014) >> EF 50%, CHFpEF CT Abd/pelvis - negative for pancreatic necrosis or abscess or complication related to Crohn's disease. New moderate bilateral pleural effusions. New moderate volume of abdominal ascites. 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  Echo 2/26 - normal EF, G1DD  IMAGING : CXR (2/17) >> no active dse CXR (2/21) >> acute B infiltrates CXR 2/26 >> Improving B/L infiltrates.   CULTURES: Blood culture (2/21) >> GNR, Serratia BCx (2/22) >> NGTD  ANTIBIOTICS :  Clofazimine 2/17 >> 2/21 Azithromycin 2/17 >> 2/21 Tigecycline 2/17 -2/20 Vanc  2/21>> 2/22 Meropenem 2/21>>2/24 Ceftazidine 2/24>> 2/25 Ceftriaxone 2/25 >> plan through 3/13 per ID recs  LINES:  R PICC --removed LIJ 2/21>> 2/27 L PICC 2/27 >  Right ileostomy   SUBJECTIVE:  Oxygenation about the same Wife requesting Xopenex > says it helped him before  VITAL SIGNS: BP 181/60 mmHg  Pulse 130  Temp(Src) 97.6 F (36.4 C) (Oral)  Resp 17  Ht  (1.778 m)  Wt 69.7 kg (153 lb 10.6 oz)  BMI 22.05 kg/m2  SpO2 90%  HEMODYNAMICS:    VENTILATOR SETTINGS: Vent Mode:  [-] PRVC FiO2 (%):  [40 %-50 %] 50 % Set Rate:  [12 bmp] 12 bmp Vt Set:  [560 mL] 560 mL PEEP:  [10 cmH20] 10 cmH20 Plateau Pressure:  [23 cmH20-35 cmH20] 23 cmH20  INTAKE / OUTPUT: I/O last 3 completed shifts: In: 2704.7 [I.V.:1192.2; NG/GT:1212.5; IV Piggyback:300] Out: 2620 [Urine:1300; Stool:1320]   PHYSICAL EXAMINATION:  General: sedated on vent HENT: NCAT ETT in place PULM: clear, vent supported breaths CV: RRR, no mgr GI: ostomy in place Derm: no real edema, bruising throughout Neuro: sedated on vent for my exam  LABS:  BMET  Recent Labs Lab 07/16/15 1345 07/17/15 0400 07/18/15 0421  NA 149* 149* 150*  K 4.4 3.3* 4.2  CL 105 103 107  CO2 35* 36* 34*  BUN 28* 33* 48*  CREATININE 1.47* 1.51* 2.05*  GLUCOSE 153* 123* 149*    Electrolytes  Recent Labs Lab 07/15/15 0318  07/16/15 0240 07/16/15 1345 07/17/15 0400 07/18/15 0421  CALCIUM 7.6*  < > 7.8* 7.6* 7.8* 7.9*  MG 1.6*  --  1.9  --   --   --  PHOS 2.5  --  3.3  3.3  --  3.1  --   < > = values in this interval not displayed.  CBC  Recent Labs Lab 07/15/15 0318 07/16/15 0240 07/18/15 0421  WBC 16.3* 19.4* 29.7*  HGB 9.5* 9.8* 9.1*  HCT 29.2* 30.9* 29.6*  PLT 52* 56* 84*    Coag's No results for input(s): APTT, INR in the last 168 hours.  Sepsis Markers No results for input(s): LATICACIDVEN, PROCALCITON, O2SATVEN in the last 168 hours.  ABG  Recent Labs Lab 07/16/15 1310  07/17/15 0340  PHART 7.495* 7.538*  PCO2ART 45.5* 44.9  PO2ART 125* 64.0*    Liver Enzymes  Recent Labs Lab 07/15/15 0318 07/16/15 0240 07/17/15 0400  ALBUMIN 1.2* 1.3* 1.2*    Cardiac Enzymes No results for input(s): TROPONINI, PROBNP in the last 168 hours.  Glucose  Recent Labs Lab 07/17/15 1540 07/17/15 1929 07/17/15 2329 07/18/15 0322 07/18/15 0824 07/18/15 1220  GLUCAP 128* 113* 153* 137* 124* 128*    Imaging Dg Chest Port 1 View  07/18/2015  CLINICAL DATA:  Respiratory failure. EXAM: PORTABLE CHEST 1 VIEW COMPARISON:  07/16/2015. FINDINGS: Interim placement of feeding tube, its tip is below the left hemidiaphragm P Endotracheal tube tip 1.5 cm above carina, withdrawal of approximately 2 cm should be considered. PICC line stable position. Stable cardiomegaly. Diffuse bilateral pulmonary infiltrates remain with slight interim clearing. Tiny pleural effusions cannot be excluded. No pneumothorax IMPRESSION: 1. Interim placement of feeding tube, its tip is below left hemidiaphragm. Endotracheal tube tip again noted 1.5 cm above the carina. Proximal repositioning of approximately 2 cm again should be considered. Right PICC line in stable position. 2. Persistent bilateral pulmonary interstitial infiltrates with slight interim clearing. Tiny bilateral pleural effusions cannot be excluded . 3. Stable cardiomegaly. These results will be called to the ordering clinician or representative by the Radiologist Assistant, and communication documented in the PACS or zVision Dashboard. Electronically Signed   By: Maisie Fus  Register   On: 07/18/2015 07:13    DISCUSSION: 65 y/o male with Crohn's disease, long standing protein calorie malnutrition, ostomy who was admitted on 2/21 with acute pancreatitis and has struggled with acute respiratory failure with hypoxemia likely due to ARDS and pulmonary edema.  Intubated 2/28.  ASSESSMENT / PLAN:  PULMONARY A:  Acute hypoxemic respiratory  failure secondary: Pulmonary edema, ARDS Wife questions dysphagia Wife requests Xopenex P:   Full vent support Titrate PEEP/FiO2 to maintain O2 saturation > 90% (3/2 decrease PEEP) SLP after extubation Xopenex nebulized q8h  CARDIOVASCULAR A:  Diastolic dysfunction based on 1610/9604 echo, EF nml. Not known to have CAD  P:  Hold lasix Continue Lopressor prn for HR and pressures Scheduled lopressor IV  RENAL A:   Acute kidney injury> 3/1 volume deplete Hypernatremia P:   Give saline bolus today Increase free water via OG tube Monitor BMET and UOP Replace electrolytes as needed   GASTROINTESTINAL A:   Crohn's disease requiring chronic steroids Drug-induced pancreatitis (Tigecycline)  - Lipase improved Chronic nausea, vomiting History of antibiotic related diarrhea Severe malnutrition P:   Continue tube feedings Continue stress ulcer prophylaxis  HEMATOLOGIC A:   Thrombocytopenia, chronic, stable H/O left lower extremity DVT in October 2016. By history, unprovoked. He was on Xarelto outpatient. 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 Abx for 1 yr. Serratia bacteremia -PICC on admission likely source P:   per ID: continue ceftriaxone through 3/13; drug holiday for M. abscessus   ENDOCRINE A:  Chronic adrenal insufficiency  P:   Continue IV steroids Monitor sugars SSI  NEUROLOGIC A:  Anxiety, chronic Sedation needs for vent synchrony P:   RASS goal: -1 Fentanyl gtt  FAMILY  - Updates: Updated wife and son at bedside at length on 3/2   My cc time 33 minutes  Heber [place], MD [facility] Pager: [phone] Cell: [phone] After 3pm or if no  response, call [phone]

## 2015-07-18 NOTE — Progress Notes (Signed)
eLink Physician-Brief Progress Note Patient Name: Lee Klein DOB: 1951/03/13 MRN: [id number]   Date of Service  07/18/2015  HPI/Events of Note    eICU Interventions  Nystatin powder ordered     Intervention Category Minor Interventions: Routine modifications to care plan (e.g. PRN medications for pain, fever)  Caley Volkert S. 07/18/2015, 6:47 PM

## 2015-07-18 NOTE — Progress Notes (Signed)
Patient ID: Lee Klein, male   DOB: [birth year], 66 y.o.   MRN: [id number]         [facility]    Date of Admission:  July 16, 2015   Total days of antibiotics 10        Day 6 Ceftriaxone         Principal Problem:   Acute pancreatitis Active Problems:   Atypical mycobacterial infection of hand   Crohn's disease (HCC)   AKI (acute kidney injury) (HCC)   PICC (peripherally inserted central catheter) in place   Chronic adrenal insufficiency (HCC)   Epigastric abdominal pain   Non-intractable cyclical vomiting with nausea   Anorexia   Abnormal loss of weight   Crohn's disease involving terminal ileum (HCC)   Crohn disease (HCC)   Elevated LFTs   Osteomyelitis of right wrist (HCC)   Osteomyelitis of left wrist (HCC)   Pyogenic arthritis of left wrist (HCC)   Malnutrition of moderate degree   Acute hypoxemic respiratory failure (HCC)   Sinus tachycardia (HCC)   Sepsis (HCC)   Acute pulmonary edema (HCC)   Lactic acidosis   History of small bowel obstruction   ARDS (adult respiratory distress syndrome) (HCC)   Septic shock (HCC)   PICC line infection   Gram negative sepsis (HCC)   Bacterial infection due to Serratia   Encounter for central line placement   . antiseptic oral rinse  7 mL Mouth Rinse q12n4p  . antiseptic oral rinse  7 mL Mouth Rinse QID  . cefTRIAXone (ROCEPHIN)  IV  2 g Intravenous Q24H  . chlorhexidine gluconate  15 mL Mouth Rinse BID  . feeding supplement (PRO-STAT SUGAR FREE 64)  30 mL Per Tube BID  . free water  200 mL Per Tube Q6H  . heparin subcutaneous  5,000 Units Subcutaneous 3 times per day  . hydrocortisone sod succinate (SOLU-CORTEF) inj  20 mg Intravenous QPM  . hydrocortisone sod succinate (SOLU-CORTEF) inj  40 mg Intravenous QAC breakfast  . insulin aspart  0-9 Units Subcutaneous 6 times per day  . metoprolol  2.5 mg Intravenous 4 times per day  . pantoprazole sodium  40 mg Per Tube Daily  . pneumococcal 23  valent vaccine  0.5 mL Intramuscular Tomorrow-1000  . sodium chloride  1,000 mL Intravenous Once  . sodium chloride flush  3 mL Intravenous Q12H   Review of Systems: Review of Systems  Unable to perform ROS: intubated    Past Medical History  Diagnosis Date  . Crohn's disease (HCC)   . GERD (gastroesophageal reflux disease)   . Anxiety   . 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 PICC (peripherally inserted central catheter) in place     right arm  . Osteomyelitis of hand, left, acute (HCC)   . Hearing loss   . Pancreatitis 10/2014  . History of kidney stones   . History of pancreatitis 11/06/2014  . Mycobacterial infection     left hand/wrist  . Thin skin   . Septic arthritis of wrist, left (HCC)   . DVT (deep venous thrombosis) (HCC) 04/2015    left leg  . Pneumonia 04/2015  . Acute pancreatitis 07/08/2015  . AKI (acute kidney injury) (HCC) 07/08/2015  . PICC (peripherally inserted central catheter) in place     Social History  Substance Use Topics  . Smoking status: Former Smoker -- 30 years    Quit date: 05/31/2006  . Smokeless  tobacco: Never Used  . Alcohol Use: 0.0 oz/week    0 Standard drinks or equivalent per week     Comment: rare    History reviewed. No pertinent family history. Allergies  Allergen Reactions  . Amikacin Other (See Comments)    CAUSED DEAFNESS  . Cefoxitin Nausea Only    ABD. PAIN  . Fish Allergy Nausea And Vomiting  . Iodine Nausea Only    JOINT PAIN  . Ivp Dye [Iodinated Diagnostic Agents] Nausea Only    JOINT PAIN  . Rifampin Nausea And Vomiting and Other (See Comments)    CHILLS  . Shellfish Allergy Swelling  . Vancomycin Other (See Comments)    Red Man Syndrome - was told that it was given to fast.  . Demerol Rash    OBJECTIVE: Filed Vitals:   07/18/15 0809 07/18/15 0827 07/18/15 0900 07/18/15 1000  BP:   99/40 120/60  Pulse:   87 95  Temp:  98.4 F (36.9 C)    TempSrc:  Oral    Resp:    12 13  Height:      Weight:      SpO2: 88%  95% 93%   Body mass index is 22.05 kg/(m^2).  Physical Exam  Constitutional:  He remains on the ventilator. His nurse reports that he has been communicated with her when she uses messages on the dry erase board. His wife is at the bedside.  Eyes: Conjunctivae are normal.  Cardiovascular: Normal rate and regular rhythm.   No murmur heard. Pulmonary/Chest: Breath sounds normal. He has no wheezes. He has no rales.  Abdominal: Soft.  Musculoskeletal:  His left hand has some swelling over the dorsum of his hand but there is no evidence of any active infection of his hand or wrist.    Lab Results Lab Results  Component Value Date   WBC 29.7* 07/18/2015   HGB 9.1* 07/18/2015   HCT 29.6* 07/18/2015   MCV 102.4* 07/18/2015   PLT 84* 07/18/2015    Lab Results  Component Value Date   CREATININE 2.05* 07/18/2015   BUN 48* 07/18/2015   NA 150* 07/18/2015   K 4.2 07/18/2015   CL 107 07/18/2015   CO2 34* 07/18/2015    Lab Results  Component Value Date   ALT 48 07/11/2015   AST 62* 07/11/2015   ALKPHOS 200* 07/11/2015   BILITOT 1.6* 07/11/2015     Microbiology: Recent Results (from the past 240 hour(s))  Culture, blood (Routine X 2) w Reflex to ID Panel     Status: None   Collection Time: 07/09/15  1:30 PM  Result Value Ref Range Status   Specimen Description BLOOD RIGHT HAND  Final   Special Requests IN PEDIATRIC BOTTLE 1CC  Final   Culture  Setup Time   Final    GRAM NEGATIVE RODS AEROBIC BOTTLE ONLY CRITICAL RESULT CALLED TO, READ BACK BY AND VERIFIED WITH: F FLYNT,RN @0713  07/10/15 MKELLY    Culture SERRATIA MARCESCENS  Final   Report Status 07/12/2015 FINAL  Final   Organism ID, Bacteria SERRATIA MARCESCENS  Final      Susceptibility   Serratia marcescens - MIC*    CEFAZOLIN >=64 RESISTANT Resistant     CEFEPIME <=1 SENSITIVE Sensitive     CEFTAZIDIME <=1 SENSITIVE Sensitive     CEFTRIAXONE <=1 SENSITIVE Sensitive      CIPROFLOXACIN <=0.25 SENSITIVE Sensitive     GENTAMICIN <=1 SENSITIVE Sensitive     TRIMETH/SULFA <=20 SENSITIVE Sensitive     *  SERRATIA MARCESCENS  Culture, blood (Routine X 2) w Reflex to ID Panel     Status: None   Collection Time: 07/09/15  1:40 PM  Result Value Ref Range Status   Specimen Description BLOOD RIGHT ARM  Final   Special Requests IN PEDIATRIC BOTTLE 2CC  Final   Culture  Setup Time   Final    GRAM NEGATIVE RODS AEROBIC BOTTLE ONLY CRITICAL RESULT CALLED TO, READ BACK BY AND VERIFIED WITH: F FLYNT,RN  07/10/15 MKELLY    Culture   Final    SERRATIA MARCESCENS SUSCEPTIBILITIES PERFORMED ON PREVIOUS CULTURE WITHIN THE LAST 5 DAYS.    Report Status 07/12/2015 FINAL  Final  MRSA PCR Screening     Status: None   Collection Time: 07/09/15  2:02 PM  Result Value Ref Range Status   MRSA by PCR NEGATIVE NEGATIVE Final    Comment:        The GeneXpert MRSA Assay (FDA approved for NASAL specimens only), is one component of a comprehensive MRSA colonization surveillance program. It is not intended to diagnose MRSA infection nor to guide or monitor treatment for MRSA infections.   Culture, blood (Routine X 2) w Reflex to ID Panel     Status: None   Collection Time: 07/10/15  1:20 PM  Result Value Ref Range Status   Specimen Description BLOOD RIGHT HAND  Final   Special Requests IN PEDIATRIC BOTTLE .5CC  Final   Culture NO GROWTH 5 DAYS  Final   Report Status 07/15/2015 FINAL  Final  Culture, blood (Routine X 2) w Reflex to ID Panel     Status: None   Collection Time: 07/10/15  1:43 PM  Result Value Ref Range Status   Specimen Description BLOOD RIGHT HAND  Final   Special Requests IN PEDIATRIC BOTTLE 3CC  Final   Culture NO GROWTH 5 DAYS  Final   Report Status 07/15/2015 FINAL  Final     ASSESSMENT: He has some leukocytosis but otherwise seems to be responding to therapy for recurrent, IV associated Serratia bacteremia. I recommend continuing ceftriaxone  through 07/29/2015. His wife tells me that his abdominal pain and nausea had resolved before he was recently intubated. 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  PLAN: 1. Recommend continuing ceftriaxone through 07/29/2015 2. I will sign off now  Cliffton Asters, MD [facility] [phone] pager   [phone] cell 07/18/2015, 10:55 AM

## 2015-07-19 ENCOUNTER — Inpatient Hospital Stay (HOSPITAL_COMMUNITY): Payer: Managed Care, Other (non HMO)

## 2015-07-19 LAB — BASIC METABOLIC PANEL
Anion gap: 6 (ref 5–15)
BUN: 61 mg/dL — AB (ref 6–20)
CHLORIDE: 109 mmol/L (ref 101–111)
CO2: 31 mmol/L (ref 22–32)
CREATININE: 2.03 mg/dL — AB (ref 0.61–1.24)
Calcium: 7.9 mg/dL — ABNORMAL LOW (ref 8.9–10.3)
GFR, EST AFRICAN AMERICAN: 39 mL/min — AB (ref >60)
GFR, EST NON AFRICAN AMERICAN: 33 mL/min — AB (ref >60)
Glucose, Bld: 108 mg/dL — ABNORMAL HIGH (ref 65–99)
POTASSIUM: 3.8 mmol/L (ref 3.5–5.1)
SODIUM: 146 mmol/L — AB (ref 135–145)

## 2015-07-19 LAB — GLUCOSE, CAPILLARY
GLUCOSE-CAPILLARY: 93 mg/dL (ref 65–99)
Glucose-Capillary: 107 mg/dL — ABNORMAL HIGH (ref 65–99)
Glucose-Capillary: 97 mg/dL (ref 65–99)
Glucose-Capillary: 126 mg/dL — ABNORMAL HIGH (ref 65–99)
Glucose-Capillary: 105 mg/dL — ABNORMAL HIGH (ref 65–99)
Glucose-Capillary: 120 mg/dL — ABNORMAL HIGH (ref 65–99)

## 2015-07-19 MED ORDER — FLUCONAZOLE IN SODIUM CHLORIDE 200-0.9 MG/100ML-% IV SOLN
200.0000 mg | Freq: Once | INTRAVENOUS | Status: AC
  Administered 2015-07-19: 200 mg via INTRAVENOUS
  Filled 2015-07-19: qty 100

## 2015-07-19 MED ORDER — SODIUM CHLORIDE 0.9 % IV SOLN
INTRAVENOUS | Status: AC
  Administered 2015-07-20: 05:00:00 via INTRAVENOUS

## 2015-07-19 MED ORDER — CLOTRIMAZOLE 1 % EX CREA
TOPICAL_CREAM | Freq: Two times a day (BID) | CUTANEOUS | Status: DC
  Administered 2015-07-19 – 2015-07-26 (×15): via TOPICAL
  Administered 2015-07-26 – 2015-07-28 (×5): 1 via TOPICAL
  Administered 2015-07-29: 22:00:00 via TOPICAL
  Administered 2015-07-29: 1 via TOPICAL
  Administered 2015-07-30 – 2015-07-31 (×3): via TOPICAL
  Filled 2015-07-19 (×4): qty 15

## 2015-07-19 NOTE — Care Management Note (Signed)
Case Management Note  Patient Details  Name: JAIDIN SHKOLNIK MRN: [id number] Date of Birth: 14-Jan-1951  Subjective/Objective:      Pt transferred to ICU for respiratory distress requiring BIPAP     Action/Plan:    07/19/2015  Pt remains intubated with wife at bedside.    07/16/15 Pt had to intubated today due to increase in resp distress  07/12/15 Pt remains on BIPAP - unable to wean, pt is on; TNA, IV lasix, IV solumedrol, IV antibiotics for serretia bacteremia.  CM will continue to monitor for disposition needs  07/09/15 Pt discharged home in December 2016 with [facility] for antibiotics (IV Tygacil).  Pt is from home at wife [name] (RN), pt also has home O2 with [facility].  CM contacted agency and informed of admit.  Expected Discharge Date:                  Expected Discharge Plan:  Home w Home Health Services  In-House Referral:     Discharge planning Services  CM Consult  Post Acute Care Choice:  Resumption of Svcs/PTA Provider Choice offered to:  Patient  DME Arranged:    DME Agency:     HH Arranged:  RN HH Agency:  [facility]  Status of Service:  In process, will continue to follow  Medicare Important Message Given:    Date Medicare IM Given:    Medicare IM give by:    Date Additional Medicare IM Given:    Additional Medicare Important Message give by:     If discussed at Long Length of Stay Meetings, dates discussed:    Additional Comments:  Cherylann Parr, RN 07/19/2015, 1:11 PM

## 2015-07-19 NOTE — Progress Notes (Signed)
PULMONARY / CRITICAL CARE MEDICINE   Name: Lee Klein MRN: [id number] DOB: [birth year]    ADMISSION DATE:  07/02/2015 CONSULTATION DATE:  07/09/15  REFERRING MD:  Dr. Oswaldo Done /  Internal Medicine  CHIEF COMPLAINT:  Abdominal pain and vomiting   BRIEF PATIENT DESCRIPTION : The patient was admitted for acute on chronic nausea and vomiting. He has Crohn's disease. Also with significant comorbidities. 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 He was treated as drug-induced pancreatitis. GI and ID have been following. He had lactic acidosis. Had fluid resuscitation. Had resp distress on 2/21. Chest x-ray with bilateral infiltrates, new from admission. He went into respiratory distress. O2 sats 60% on non breather mask. Transferred to ICU.  ABG prior to transfer was 7.26, PCO2 33, PO2 48 on 100% nonrebreather mask. He received 80 mg IV Lasix. He was intubated on 2/28 for prorgressive respiratory failure.  SIGNIFICANT EVENTS:  07/02/2015 admitted for nausea vomiting, possible drug-induced pancreatitis 07/09/15 went into resp distress: O2 sats 60% on NRBM; transferred to ICU  07/12/15 marked fluid overload unable to wean off bipap 07/16/15 Intubated   STUDIES : [name] Abd (2/17) >> Heterogeneous echogenicity of pancreas which could due pancreatic inflammation; small ascites; fatty liver 2D echo (05/2014) >> EF 50%, CHFpEF CT Abd/pelvis - negative for pancreatic necrosis or abscess or complication related to Crohn's disease. New moderate bilateral pleural effusions. New moderate volume of abdominal ascites. 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  Echo 2/26 - normal EF, G1DD  IMAGING :   CULTURES: Blood culture (2/21) >> GNR, Serratia BCx (2/22) >> NGTD  ANTIBIOTICS :  Clofazimine 2/17 >> 2/21 Azithromycin 2/17 >> 2/21 Tigecycline 2/17 -2/20 Vanc 2/21>> 2/22 Meropenem 2/21>>2/24 Ceftazidine 2/24>> 2/25 Ceftriaxone 2/25 >> plan through 3/13  per ID recs  LINES:  R PICC --removed LIJ 2/21>> 2/27 L PICC 2/27 >  Right ileostomy   SUBJECTIVE:  Oxygenation unchanged Groin rash Renal function stable  VITAL SIGNS: BP 103/66 mmHg  Pulse 111  Temp(Src) 99 F (37.2 C) (Oral)  Resp 11  Ht 5\' 10"  (1.778 m)  Wt 68.6 kg (151 lb 3.8 oz)  BMI 21.70 kg/m2  SpO2 93%  HEMODYNAMICS:    VENTILATOR SETTINGS: Vent Mode:  [-] PRVC FiO2 (%):  [40 %-60 %] 60 % Set Rate:  [12 bmp] 12 bmp Vt Set:  [560 mL] 560 mL PEEP:  [8 cmH20] 8 cmH20 Plateau Pressure:  [21 cmH20-34 cmH20] 25 cmH20  INTAKE / OUTPUT: I/O last 3 completed shifts: In: 3901.1 [I.V.:1251.1; Other:10; NG/GT:2490; IV Piggyback:150] Out: 1475 [Urine:950; Stool:525]   PHYSICAL EXAMINATION:  General: sedated on vent HENT: NCAT ETT in place PULM: clear, vent supported breaths CV: RRR, no mgr GI: ostomy in place Derm: some arm edema, bruising throughout extensor surfaces, scrotum and inner thigh with erythematous rash Neuro: sedated on vent for my exam  LABS:  BMET  Recent Labs Lab 07/17/15 0400 07/18/15 0421 07/19/15 0421  NA 149* 150* 146*  K 3.3* 4.2 3.8  CL 103 107 109  CO2 36* 34* 31  BUN 33* 48* 61*  CREATININE 1.51* 2.05* 2.03*  GLUCOSE 123* 149* 108*    Electrolytes  Recent Labs Lab 07/15/15 0318  07/16/15 0240  07/17/15 0400 07/18/15 0421 07/19/15 0421  CALCIUM 7.6*  < > 7.8*  < > 7.8* 7.9* 7.9*  MG 1.6*  --  1.9  --   --   --   --   PHOS  2.5  --  3.3  3.3  --  3.1  --   --   < > = values in this interval not displayed.  CBC  Recent Labs Lab 07/15/15 0318 07/16/15 0240 07/18/15 0421  WBC 16.3* 19.4* 29.7*  HGB 9.5* 9.8* 9.1*  HCT 29.2* 30.9* 29.6*  PLT 52* 56* 84*    Coag's No results for input(s): APTT, INR in the last 168 hours.  Sepsis Markers No results for input(s): LATICACIDVEN, PROCALCITON, O2SATVEN in the last 168 hours.  ABG  Recent Labs Lab 07/16/15 1310 07/17/15 0340  PHART 7.495* 7.538*  PCO2ART  45.5* 44.9  PO2ART 125* 64.0*    Liver Enzymes  Recent Labs Lab 07/15/15 0318 07/16/15 0240 07/17/15 0400  ALBUMIN 1.2* 1.3* 1.2*    Cardiac Enzymes No results for input(s): TROPONINI, PROBNP in the last 168 hours.  Glucose  Recent Labs Lab 07/18/15 1527 07/18/15 2003 07/18/15 2325 07/19/15 0304 07/19/15 0734 07/19/15 1155  GLUCAP 138* 102* 93 107* 97 126*    Imaging No results found.  DISCUSSION: 65 y/o male with Crohn's disease, long standing protein calorie malnutrition, ostomy who was admitted on 2/21 with acute pancreatitis and has struggled with acute respiratory failure with hypoxemia likely due to ARDS and pulmonary edema.  Intubated 2/28.  ASSESSMENT / PLAN:  PULMONARY A:  Acute hypoxemic respiratory failure secondary: Pulmonary edema, ARDS Wife questions dysphagia Wife requests Xopenex P:   Continue full vent support Titrate PEEP/FiO2 to maintain O2 saturation > 90% (3/2 decrease PEEP) SLP after extubation Xopenex nebulized q8h  CARDIOVASCULAR A:  Diastolic dysfunction based on 1610/9604 echo, EF nml. Not known to have CAD  P:  Continue Lopressor prn for HR and pressures Scheduled lopressor IV  RENAL A:   Acute kidney injury> 3/3 volume deplete Hypernatremia P:   Gentle saline overnight then re-assess in morning Continue free water via OG tube Monitor BMET and UOP Replace electrolytes as needed   GASTROINTESTINAL A:   Crohn's disease requiring chronic steroids Drug-induced pancreatitis (Tigecycline)  - Lipase improved Chronic nausea, vomiting History of antibiotic related diarrhea Severe malnutrition P:   Continue tube feedings Continue stress ulcer prophylaxis  HEMATOLOGIC A:   Thrombocytopenia, chronic, stable H/O left lower extremity DVT in October 2016. By history, unprovoked. He was on Xarelto outpatient. 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 Abx for 1 yr. Serratia bacteremia -PICC on admission likely source Fungal rash groin P:   per ID: continue ceftriaxone through 3/13; drug holiday for M. abscessus Diflucan 1 dose Clotrimazole cream groin  ENDOCRINE A:  Chronic adrenal insufficiency  P:   Continue IV steroids Monitor sugars SSI  NEUROLOGIC A:  Anxiety, chronic Sedation needs for vent synchrony P:   RASS goal: -1 Fentanyl gtt  FAMILY  - Updates: Updated wife and son at bedside at length on 3/3    My cc time 37 minutes  Heber Chester, MD [facility] Pager: [phone] Cell: [phone] After 3pm or if no response, call [phone]

## 2015-07-19 NOTE — Progress Notes (Signed)
Nutrition Follow-up  DOCUMENTATION CODES:   Non-severe (moderate) malnutrition in context of chronic illness  INTERVENTION:    Continue Vital AF 1.2 at goal rate of 50 ml/h (1200 ml per day) with Prostat 30 ml BID to provide 1640 kcals, 120 gm protein, 973 ml free water daily.  NUTRITION DIAGNOSIS:   Malnutrition related to chronic illness as evidenced by mild depletion of muscle mass, mild depletion of body fat.  Ongoing  GOAL:   Patient will meet greater than or equal to 90% of their needs  Met  MONITOR:   Vent status, Labs, Weight trends, TF tolerance, Skin  ASSESSMENT:   Mr. 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 In the ED he was noted to have a significant leukocytosis, lactic acidosis, acute kidney injury, and elevated lipase consistent with pancreatitis and was therefore admitted to the internal medicine teaching service for further evaluation and care. He denies a history of alcohol use or hypertriglyceridemia. He had a cholecystectomy in the past and has had no problems since. He has had pancreatitis in the past.  Discussed patient in ICU rounds and with RN today. Patient is tolerating TF well via Cortrak tube at goal rate to meet 100% of nutrition needs. Labs reviewed: sodium elevated. Is receiving 300 ml free water flushes every 4 hours. CBG's: [phone]  Patient is currently  intubated on ventilator support MV: 7.5 L/min Temp (24hrs), Avg:98.5 F (36.9 C), Min:97.7 F (36.5 C), Max:99 F (37.2 C)  Propofol: none   Diet Order:   NPO  Skin:  Reviewed, no issues  Last BM:  3/3  Height:   Ht Readings from Last 1 Encounters:  07/02/2015 _0  (1.778 m)    Weight:   Wt Readings from Last 1 Encounters:  07/19/15 151 lb 3.8 oz (68.6 kg)    Ideal Body Weight:  75.5 kg  BMI:  Body mass index is 21.7 kg/(m^2).  Estimated Nutritional Needs:   Kcal:  1670  Protein:  115-130 gm  Fluid:  >/= 2 L  EDUCATION NEEDS:   Education needs addressed  Molli Barrows, [place], [place], [place] Pager [phone] After Hours Pager [phone]

## 2015-07-20 ENCOUNTER — Inpatient Hospital Stay (HOSPITAL_COMMUNITY): Payer: Managed Care, Other (non HMO)

## 2015-07-20 LAB — BASIC METABOLIC PANEL
Anion gap: 5 (ref 5–15)
BUN: 53 mg/dL — ABNORMAL HIGH (ref 6–20)
CHLORIDE: 109 mmol/L (ref 101–111)
CO2: 31 mmol/L (ref 22–32)
Calcium: 7.9 mg/dL — ABNORMAL LOW (ref 8.9–10.3)
Creatinine, Ser: 1.76 mg/dL — ABNORMAL HIGH (ref 0.61–1.24)
GFR calc non Af Amer: 40 mL/min — ABNORMAL LOW (ref >60)
GFR, EST AFRICAN AMERICAN: 46 mL/min — AB (ref >60)
Glucose, Bld: 87 mg/dL (ref 65–99)
POTASSIUM: 3.7 mmol/L (ref 3.5–5.1)
SODIUM: 145 mmol/L (ref 135–145)

## 2015-07-20 LAB — CBC WITH DIFFERENTIAL/PLATELET
Basophils Absolute: 0 10*3/uL (ref 0.0–0.1)
Basophils Relative: 0 %
EOS PCT: 0 %
Eosinophils Absolute: 0 10*3/uL (ref 0.0–0.7)
HEMATOCRIT: 24.8 % — AB (ref 39.0–52.0)
HEMOGLOBIN: 7.9 g/dL — AB (ref 13.0–17.0)
LYMPHS PCT: 4 %
Lymphs Abs: 1.1 10*3/uL (ref 0.7–4.0)
MCH: 32.2 pg (ref 26.0–34.0)
MCHC: 31.9 g/dL (ref 30.0–36.0)
MCV: 101.2 fL — ABNORMAL HIGH (ref 78.0–100.0)
MONOS PCT: 3 %
Monocytes Absolute: 0.8 10*3/uL (ref 0.1–1.0)
NEUTROS PCT: 93 %
Neutro Abs: 25.2 10*3/uL — ABNORMAL HIGH (ref 1.7–7.7)
Platelets: 86 10*3/uL — ABNORMAL LOW (ref 150–400)
RBC: 2.45 MIL/uL — AB (ref 4.22–5.81)
RDW: 15.7 % — ABNORMAL HIGH (ref 11.5–15.5)
WBC: 27.1 10*3/uL — AB (ref 4.0–10.5)

## 2015-07-20 LAB — GLUCOSE, CAPILLARY
GLUCOSE-CAPILLARY: 105 mg/dL — AB (ref 65–99)
GLUCOSE-CAPILLARY: 88 mg/dL (ref 65–99)
GLUCOSE-CAPILLARY: 87 mg/dL (ref 65–99)
GLUCOSE-CAPILLARY: 93 mg/dL (ref 65–99)
GLUCOSE-CAPILLARY: 113 mg/dL — AB (ref 65–99)
Glucose-Capillary: 117 mg/dL — ABNORMAL HIGH (ref 65–99)

## 2015-07-20 MED ORDER — MIDAZOLAM HCL 2 MG/2ML IJ SOLN
2.0000 mg | Freq: Once | INTRAMUSCULAR | Status: AC
  Administered 2015-07-20: 2 mg via INTRAVENOUS

## 2015-07-20 MED ORDER — METOPROLOL TARTRATE 1 MG/ML IV SOLN: 2.5000 mg | Freq: Four times a day (QID) | INTRAVENOUS | Status: DC | PRN

## 2015-07-20 MED ORDER — MIDAZOLAM HCL 2 MG/2ML IJ SOLN
INTRAMUSCULAR | Status: AC
  Filled 2015-07-20: qty 2

## 2015-07-20 MED ORDER — SODIUM CHLORIDE 0.9 % IV SOLN
2.0000 mg/h | INTRAVENOUS | Status: DC
  Administered 2015-07-20 – 2015-07-21 (×3): 2 mg/h via INTRAVENOUS
  Filled 2015-07-20 (×5): qty 10

## 2015-07-20 MED ORDER — MIDAZOLAM HCL 2 MG/2ML IJ SOLN
1.0000 mg | INTRAMUSCULAR | Status: DC | PRN
  Administered 2015-07-20: 2 mg via INTRAVENOUS
  Filled 2015-07-20: qty 2

## 2015-07-20 NOTE — Progress Notes (Signed)
MD ordered ABG for patient however two RTS stuck maximum amount of attempts and were both unsuccessful.  MD aware and will wean FIO2 based on oxygen saturation.  Will continue to monitor.

## 2015-07-20 NOTE — Progress Notes (Signed)
Nursing note: Patient intubated and sedated with HOB at 30 degrees.  Patient began to cough and noted tube feeds coming from mouth.  Small bore NGT originally placed by NP noted at 65cm secured to nostril and found to be at 18cm secured to nostril.  Immediately stopped tube feeds, suctioned patient orally and via ETTube aggressively and returned scant clear secretions from artificial airway and approximately of tube feeds from oral cavity.  Notified E-Link, continue to observe, and wait for new orders from physician.  Patient VSS.

## 2015-07-20 NOTE — Progress Notes (Signed)
PULMONARY / CRITICAL CARE MEDICINE   Name: Lee Klein MRN: [id number] DOB: 12-31-1950    ADMISSION DATE:  06/20/2015  CONSULTATION DATE: 07/09/15  REFERRING MD: Dr. Oswaldo Done / Internal Medicine  CHIEF COMPLAINT: Abdominal pain and vomiting   BRIEF PATIENT DESCRIPTION : The patient was admitted for acute on chronic nausea and vomiting. He has Crohn's disease. Also with significant comorbidities. 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 He was treated as drug-induced pancreatitis. GI and ID have been following. He had lactic acidosis. Had fluid resuscitation. Had resp distress on 2/21. Chest x-ray with bilateral infiltrates, new from admission. He went into respiratory distress. O2 sats 60% on non breather mask. Transferred to ICU. ABG prior to transfer was 7.26, PCO2 33, PO2 48 on 100% nonrebreather mask. He received 80 mg IV Lasix. He was intubated on 2/28 for prorgressive respiratory failure.  SIGNIFICANT EVENTS:  06/20/2015 admitted for nausea vomiting, possible drug-induced pancreatitis 07/09/15 went into resp distress: O2 sats 60% on NRBM; transferred to ICU  07/12/15 marked fluid overload unable to wean off bipap 07/16/15 Intubated   STUDIES : [name] Abd (2/17) >> Heterogeneous echogenicity of pancreas which could due pancreatic inflammation; small ascites; fatty liver 2D echo (05/2014) >> EF 50%, CHFpEF CT Abd/pelvis - negative for pancreatic necrosis or abscess or complication related to Crohn's disease. New moderate bilateral pleural effusions. New moderate volume of abdominal ascites. 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  Echo 2/26 - normal EF, G1DD  IMAGING :   CULTURES: Blood culture (2/21) >> GNR, Serratia BCx (2/22) >> NGTD  ANTIBIOTICS :  Clofazimine 2/17 >> 2/21 Azithromycin 2/17 >> 2/21 Tigecycline 2/17 -2/20 Vanc 2/21>> 2/22 Meropenem 2/21>>2/24 Ceftazidine 2/24>> 2/25 Ceftriaxone 2/25 >> plan through  3/13 per ID recs  LINES:  R PICC --removed LIJ 2/21>> 2/27 L PICC 2/27 >  Right ileostomy       SUBJECTIVE:  Remains on vent. Was agitated this am -- needed prn versed.   VITAL SIGNS: BP 125/52 mmHg  Pulse 99  Temp(Src) 98.4 F (36.9 C) (Oral)  Resp 13  Ht  (1.778 m)  Wt 153 lb 14.1 oz (69.8 kg)  BMI 22.08 kg/m2  SpO2 95%  HEMODYNAMICS:    VENTILATOR SETTINGS: Vent Mode:  [-] PRVC FiO2 (%):  [50 %-60 %] 60 % Set Rate:  [12 bmp] 12 bmp Vt Set:  [560 mL] 560 mL PEEP:  [8 cmH20-10 cmH20] 10 cmH20 Plateau Pressure:  [16 cmH20-28 cmH20] 25 cmH20  INTAKE / OUTPUT: I/O last 3 completed shifts: In: 41 [I.V.:2275; Other:10; NG/GT:2260; IV Piggyback:100] Out: 1340 [Urine:990; Stool:350]  PHYSICAL EXAMINATION:  General: sedated on vent HENT: NCAT ETT in place PULM: vent supported breaths. Bibasilar crackles.  CV: RRR, no mgr GI: ostomy in place Derm: some arm edema, bruising throughout extensor surfaces, scrotum and inner thigh with erythematous rash Neuro: sedated on vent for my exam  LABS:  BMET  Recent Labs Lab 07/18/15 0421 07/19/15 0421 07/20/15 0506  NA 150* 146* 145  K 4.2 3.8 3.7  CL 107 109 109  CO2 34* 31 31  BUN 48* 61* 53*  CREATININE 2.05* 2.03* 1.76*  GLUCOSE 149* 108* 87    Electrolytes  Recent Labs Lab 07/15/15 0318  07/16/15 0240  07/17/15 0400 07/18/15 0421 07/19/15 0421 07/20/15 0506  CALCIUM 7.6*  < > 7.8*  < > 7.8* 7.9* 7.9* 7.9*  MG 1.6*  --  1.9  --   --   --   --   --  PHOS 2.5  --  3.3  3.3  --  3.1  --   --   --   < > = values in this interval not displayed.  CBC  Recent Labs Lab 07/16/15 0240 07/18/15 0421 07/20/15 0506  WBC 19.4* 29.7* 27.1*  HGB 9.8* 9.1* 7.9*  HCT 30.9* 29.6* 24.8*  PLT 56* 84* 86*    Coag's No results for input(s): APTT, INR in the last 168 hours.  Sepsis Markers No results for input(s): LATICACIDVEN, PROCALCITON, O2SATVEN in the last 168 hours.  ABG  Recent  Labs Lab 07/16/15 1310 07/17/15 0340  PHART 7.495* 7.538*  PCO2ART 45.5* 44.9  PO2ART 125* 64.0*    Liver Enzymes  Recent Labs Lab 07/15/15 0318 07/16/15 0240 07/17/15 0400  ALBUMIN 1.2* 1.3* 1.2*    Cardiac Enzymes No results for input(s): TROPONINI, PROBNP in the last 168 hours.  Glucose  Recent Labs Lab 07/19/15 1155 07/19/15 1516 07/19/15 2038 07/19/15 2329 07/20/15 0324 07/20/15 0818  GLUCAP 126* 105* 120* 105* 88 87    Imaging Dg Chest Port 1 View  07/20/2015  CLINICAL DATA:  Acute respiratory failure with hypoxemia EXAM: PORTABLE CHEST - 1 VIEW COMPARISON:  07/18/2015 FINDINGS: The endotracheal tube remains low, tip less than 2 cm above carina. Feeding tube has been removed, nasogastric tube advanced into the stomach. Right arm PICC is stable. Moderate interstitial and alveolar opacities throughout both lungs are largely stable. No definite effusion. Heart size within normal limits for technique. No pneumothorax. Degenerative changes in bilateral shoulders. IMPRESSION: 1. Low endotracheal tube tip, less than 2 cm above carina. 2. Stable bilateral infiltrates or edema. Electronically Signed   By: Corlis Leak M.D.   On: 07/20/2015 09:22   Dg Abd Portable 1v  07/20/2015  CLINICAL DATA:  65 year old male with NG placement EXAM: PORTABLE ABDOMEN - 1 VIEW COMPARISON:  Radiograph dated 07/16/2015 FINDINGS: An enteric tube is partially visualized with tip in the left upper abdomen likely in the proximal stomach. A right lower quadrant ostomy is noted. There multiple surgical clips in the upper abdomen. A small metallic density is noted to the left of the L2 vertebra similar to prior study. There is no bowel dilatation. No free air identified. No radiopaque calculi. There bibasilar interstitial prominence and nodularity similar to prior study. There is degenerative changes of the spine predominantly at L2-L3. No acute fracture. IMPRESSION: Enteric tube with tip in the left upper  abdomen likely in the stomach. No evidence of bowel obstruction. Electronically Signed   By: Elgie Collard M.D.   On: 07/20/2015 02:21   DISCUSSION: 65 y/o male with Crohn's disease, long standing protein calorie malnutrition, ostomy who was admitted on 2/21 with acute pancreatitis and has struggled with acute respiratory failure with hypoxemia likely due to ARDS and pulmonary edema. Intubated 2/28.  ASSESSMENT / PLAN:  PULMONARY A:  Acute hypoxemic respiratory failure secondary: Pulmonary edema, ARDS Wife questions dysphagia Wife requests Xopenex P:  Continue full vent support Still on 60% FiO2 -- get abg -- If oxygenation better, try to dec FiO2. Difficult to wean as pt has agitation issues.  Titrate PEEP/FiO2 to maintain O2 saturation > 90% (3/2 decrease PEEP) SLP after extubation Xopenex nebulized q8h  CARDIOVASCULAR A:  Diastolic dysfunction based on 9562/1308 echo, EF nml. Not known to have CAD  P:  Continue Lopressor prn for HR and pressures Scheduled lopressor IV  RENAL A:  Acute kidney injury> 3/3 volume deplete Hypernatremia P:  Gentle saline overnight  then re-assess in morning >> pt with (+) balance. Will d/c IVF.  Continue free water via OG tube Monitor BMET and UOP Replace electrolytes as needed   GASTROINTESTINAL A:  Crohn's disease requiring chronic steroids Drug-induced pancreatitis (Tigecycline) - Lipase improved Chronic nausea, vomiting History of antibiotic related diarrhea Severe malnutrition P:  Continue tube feedings Continue stress ulcer prophylaxis  HEMATOLOGIC A:  Thrombocytopenia, chronic, stable H/O left lower extremity DVT in October 2016. By history, unprovoked. He was on Xarelto outpatient. 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 Abx for 1 yr. Serratia bacteremia -PICC on admission likely source Fungal rash groin P:  per ID: continue ceftriaxone through 3/13; drug holiday for M. abscessus Diflucan 1 dose Clotrimazole cream groin  ENDOCRINE A: Chronic adrenal insufficiency  P:  Continue IV steroids Monitor sugars SSI  NEUROLOGIC A: Anxiety, chronic Sedation needs for vent synchrony P:  RASS goal: -1 Fentanyl gtt.  Pt was agitated this am -- needed higher fentanyl. Will lower fentanyl dose to 200 mcg/hr. Start versded prn for agitation. May need precedex when weaning.   FAMILY  - Updates: Updated wife at bedside at length.  Critical Care time spent with this pt today was 30 minutes.    Pollie Meyer, MD Pulmonary and Critical Care Medicine [facility] Pager: [phone] After 3 pm or if no response, call [phone]  07/20/2015, 10:04 AM

## 2015-07-20 NOTE — Progress Notes (Signed)
RT called to pt room for desat in 80's. Pt suctioned and lavaged with little return. Recruitment maneuver preformed SPO2 100%. FiO2 increased to 50%. RT will continue to monitor

## 2015-07-20 NOTE — Progress Notes (Signed)
Pt got very agitated, asynchrony with ventilator, fighting vent, trying to pull on tube, O2 in 80s, RR in high 20s. Dr. Christene Slates called and notified. Versed drip order in place. Bolus Fentanyl from bag was given at this time. Waiting on medication

## 2015-07-21 ENCOUNTER — Inpatient Hospital Stay (HOSPITAL_COMMUNITY): Payer: Managed Care, Other (non HMO)

## 2015-07-21 DIAGNOSIS — J80 Acute respiratory distress syndrome: Secondary | ICD-10-CM

## 2015-07-21 LAB — CBC
HCT: 28.3 % — ABNORMAL LOW (ref 39.0–52.0)
Hemoglobin: 9.2 g/dL — ABNORMAL LOW (ref 13.0–17.0)
MCH: 33.2 pg (ref 26.0–34.0)
MCHC: 32.5 g/dL (ref 30.0–36.0)
MCV: 102.2 fL — ABNORMAL HIGH (ref 78.0–100.0)
PLATELETS: 100 10*3/uL — AB (ref 150–400)
RBC: 2.77 MIL/uL — ABNORMAL LOW (ref 4.22–5.81)
RDW: 15.4 % (ref 11.5–15.5)
WBC: 30.4 10*3/uL — ABNORMAL HIGH (ref 4.0–10.5)

## 2015-07-21 LAB — POCT I-STAT 3, ART BLOOD GAS (G3+)
ACID-BASE EXCESS: 5 mmol/L — AB (ref 0.0–2.0)
Bicarbonate: 29.3 mEq/L — ABNORMAL HIGH (ref 20.0–24.0)
O2 SAT: 97 %
PCO2 ART: 40.5 mmHg (ref 35.0–45.0)
PH ART: 7.466 — AB (ref 7.350–7.450)
Patient temperature: 98.6
TCO2: 30 mmol/L (ref 0–100)
pO2, Arterial: 89 mmHg (ref 80.0–100.0)

## 2015-07-21 LAB — GLUCOSE, CAPILLARY
GLUCOSE-CAPILLARY: 102 mg/dL — AB (ref 65–99)
GLUCOSE-CAPILLARY: 116 mg/dL — AB (ref 65–99)
GLUCOSE-CAPILLARY: 121 mg/dL — AB (ref 65–99)
Glucose-Capillary: 92 mg/dL (ref 65–99)
Glucose-Capillary: 114 mg/dL — ABNORMAL HIGH (ref 65–99)

## 2015-07-21 LAB — BASIC METABOLIC PANEL
Anion gap: 9 (ref 5–15)
BUN: 47 mg/dL — AB (ref 6–20)
CO2: 27 mmol/L (ref 22–32)
CREATININE: 1.51 mg/dL — AB (ref 0.61–1.24)
Calcium: 7.8 mg/dL — ABNORMAL LOW (ref 8.9–10.3)
Chloride: 104 mmol/L (ref 101–111)
GFR calc Af Amer: 55 mL/min — ABNORMAL LOW (ref >60)
GFR, EST NON AFRICAN AMERICAN: 48 mL/min — AB (ref >60)
Glucose, Bld: 117 mg/dL — ABNORMAL HIGH (ref 65–99)
Potassium: 3.3 mmol/L — ABNORMAL LOW (ref 3.5–5.1)
SODIUM: 140 mmol/L (ref 135–145)

## 2015-07-21 MED ORDER — SODIUM CHLORIDE 0.9 % IV SOLN: 3.0000 mg/h | INTRAVENOUS | Status: DC

## 2015-07-21 MED ORDER — SODIUM CHLORIDE 0.9 % IV SOLN
1.0000 mg/h | INTRAVENOUS | Status: DC
  Administered 2015-07-22: 2 mg/h via INTRAVENOUS
  Administered 2015-07-23 (×2): 8 mg/h via INTRAVENOUS
  Administered 2015-07-23: 4 mg/h via INTRAVENOUS
  Administered 2015-07-23 (×2): 2 mg/h via INTRAVENOUS
  Administered 2015-07-24: 8 mg/h via INTRAVENOUS
  Administered 2015-07-24 – 2015-07-25 (×6): 10 mg/h via INTRAVENOUS
  Filled 2015-07-21 (×14): qty 10

## 2015-07-21 MED ORDER — FENTANYL BOLUS VIA INFUSION
50.0000 ug | INTRAVENOUS | Status: DC | PRN
  Filled 2015-07-21: qty 50

## 2015-07-21 MED ORDER — FENTANYL CITRATE (PF) 100 MCG/2ML IJ SOLN
100.0000 ug | Freq: Once | INTRAMUSCULAR | Status: AC
  Administered 2015-07-21: 100 ug via INTRAVENOUS

## 2015-07-21 MED ORDER — CISATRACURIUM BOLUS VIA INFUSION
0.0500 mg/kg | Freq: Once | INTRAVENOUS | Status: AC
  Administered 2015-07-21: 3.5 mg via INTRAVENOUS
  Filled 2015-07-21: qty 4

## 2015-07-21 MED ORDER — MIDAZOLAM BOLUS VIA INFUSION
2.0000 mg | INTRAVENOUS | Status: DC | PRN
  Filled 2015-07-21: qty 2

## 2015-07-21 MED ORDER — MIDAZOLAM HCL 2 MG/2ML IJ SOLN
2.0000 mg | Freq: Once | INTRAMUSCULAR | Status: AC
  Administered 2015-07-21: 2 mg via INTRAVENOUS

## 2015-07-21 MED ORDER — FENTANYL CITRATE (PF) 100 MCG/2ML IJ SOLN
100.0000 ug | Freq: Once | INTRAMUSCULAR | Status: DC | PRN
  Filled 2015-07-21 (×3): qty 2

## 2015-07-21 MED ORDER — FUROSEMIDE 10 MG/ML IJ SOLN
20.0000 mg | Freq: Once | INTRAMUSCULAR | Status: AC
  Administered 2015-07-21: 20 mg via INTRAVENOUS
  Filled 2015-07-21: qty 2

## 2015-07-21 MED ORDER — ARTIFICIAL TEARS OP OINT
1.0000 "application " | TOPICAL_OINTMENT | Freq: Three times a day (TID) | OPHTHALMIC | Status: DC
  Administered 2015-07-21 – 2015-07-27 (×20): 1 via OPHTHALMIC
  Filled 2015-07-21: qty 3.5

## 2015-07-21 MED ORDER — MIDAZOLAM HCL 2 MG/2ML IJ SOLN: 2.0000 mg | Freq: Once | INTRAMUSCULAR | Status: DC | PRN

## 2015-07-21 MED ORDER — SODIUM CHLORIDE 0.9 % IV SOLN
3.0000 ug/kg/min | INTRAVENOUS | Status: DC
  Administered 2015-07-21 – 2015-07-27 (×8): 3 ug/kg/min via INTRAVENOUS
  Filled 2015-07-21 (×9): qty 20

## 2015-07-21 MED ORDER — SODIUM CHLORIDE 0.9 % IV SOLN
25.0000 ug/h | INTRAVENOUS | Status: DC
  Administered 2015-07-22: 250 ug/h via INTRAVENOUS
  Administered 2015-07-22: 275 ug/h via INTRAVENOUS
  Administered 2015-07-23 (×2): 300 ug/h via INTRAVENOUS
  Administered 2015-07-24 (×2): 400 ug/h via INTRAVENOUS
  Administered 2015-07-24: 300 ug/h via INTRAVENOUS
  Administered 2015-07-25 – 2015-07-26 (×5): 400 ug/h via INTRAVENOUS
  Administered 2015-07-27: 300 ug/h via INTRAVENOUS
  Administered 2015-07-27 (×2): 400 ug/h via INTRAVENOUS
  Administered 2015-07-28: 100 ug/h via INTRAVENOUS
  Administered 2015-07-28: 300 ug/h via INTRAVENOUS
  Administered 2015-07-30: 100 ug/h via INTRAVENOUS
  Administered 2015-07-31: 75 ug/h via INTRAVENOUS
  Filled 2015-07-21 (×21): qty 50

## 2015-07-21 NOTE — Procedures (Signed)
Arterial Catheter Insertion Procedure Note Lee Klein [id number] 02-11-1951  Procedure: Insertion of Arterial Catheter  Indications: Blood pressure monitoring and Frequent blood sampling  Procedure Details Consent: Risks of procedure as well as the alternatives and risks of each were explained to the (patient/caregiver).  Consent for procedure obtained. Time Out: Verified patient identification, verified procedure, site/side was marked, verified correct patient position, special equipment/implants available, medications/allergies/relevent history reviewed, required imaging and test results available.  Performed  Maximum sterile technique was used including antiseptics, cap, gloves, gown, hand hygiene, mask and sheet. Skin prep: Chlorhexidine; local anesthetic administered Single attempt under ultrasound guidance; pulsatile flow noted. A 20 gauge catheter was inserted into left femoral artery using Seldinger technique and sutured into place. A dressing was applied.   Evaluation Blood flow good; BP tracing good. Complications: No apparent complications.  Lee Sickle, MD Pulmonary and Critical Care 07/21/2015 7:48 PM

## 2015-07-21 NOTE — Progress Notes (Signed)
eLink Physician-Brief Progress Note Patient Name: Lee Klein DOB: 12/12/1950 MRN: [id number]   Date of Service  07/21/2015  HPI/Events of Note    eICU Interventions  ARDS orders for PEEP/FIO2     Intervention Category Major Interventions: Respiratory failure - evaluation and management  ALVA,RAKESH V. 07/21/2015, 4:10 PM

## 2015-07-21 NOTE — Progress Notes (Signed)
ETT pulled back 2cm per MD order. 

## 2015-07-21 NOTE — Progress Notes (Signed)
PULMONARY / CRITICAL CARE MEDICINE   Name: Lee Klein MRN: [id number] DOB: 1951/04/20    ADMISSION DATE:  07-21-2015 CONSULTATION DATE: 07/09/15  REFERRING MD: Dr. Oswaldo Done / Internal Medicine  CHIEF COMPLAINT: Abdominal pain and vomiting   BRIEF PATIENT DESCRIPTION : The patient was admitted for acute on chronic nausea and vomiting. He has Crohn's disease. Also with significant comorbidities. 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 He was treated as drug-induced pancreatitis. GI and ID have been following. He had lactic acidosis. Had fluid resuscitation. Had resp distress on 2/21. Chest x-ray with bilateral infiltrates, new from admission. He went into respiratory distress. O2 sats 60% on non breather mask. Transferred to ICU. ABG prior to transfer was 7.26, PCO2 33, PO2 48 on 100% nonrebreather mask. He received 80 mg IV Lasix. He was intubated on 2/28 for prorgressive respiratory failure.  SUBJECTIVE:  Worse resp status over 24 hrs. On 80% Fio2. More agitated.   VITAL SIGNS: BP 136/67 mmHg  Pulse 135  Temp(Src) 97.3 F (36.3 C) (Oral)  Resp 31  Ht  (1.778 m)  Wt 154 lb 12.2 oz (70.2 kg)  BMI 22.21 kg/m2  SpO2 95%   PHYSICAL EXAMINATION:  General: sedated on vent. Episodes of agitation.  HENT: NCAT ETT in place PULM: vent supported breaths. B crackles CV: RRR, no mgr GI: ostomy in place Derm: some arm edema, bruising throughout extensor surfaces, scrotum and inner thigh with erythematous rash Neuro: sedated on vent for my exam More edema today.    HEMODYNAMICS:    VENTILATOR SETTINGS: Vent Mode:  [-] PRVC FiO2 (%):  [40 %-80 %] 80 % Set Rate:  [12 bmp] 12 bmp Vt Set:  [560 mL] 560 mL PEEP:  [10 cmH20] 10 cmH20 Plateau Pressure:  [29 cmH20-38 cmH20] 38 cmH20  INTAKE / OUTPUT: I/O last 3 completed shifts: In: 4213 [I.V.:1863; NG/GT:2250; IV Piggyback:100] Out: 1995 [Urine:1170;  Stool:825]   LABS:  BMET  Recent Labs Lab 07/18/15 0421 07/19/15 0421 07/20/15 0506  NA 150* 146* 145  K 4.2 3.8 3.7  CL 107 109 109  CO2 34* 31 31  BUN 48* 61* 53*  CREATININE 2.05* 2.03* 1.76*  GLUCOSE 149* 108* 87    Electrolytes  Recent Labs Lab 07/15/15 0318  07/16/15 0240  07/17/15 0400 07/18/15 0421 07/19/15 0421 07/20/15 0506  CALCIUM 7.6*  < > 7.8*  < > 7.8* 7.9* 7.9* 7.9*  MG 1.6*  --  1.9  --   --   --   --   --   PHOS 2.5  --  3.3  3.3  --  3.1  --   --   --   < > = values in this interval not displayed.  CBC  Recent Labs Lab 07/16/15 0240 07/18/15 0421 07/20/15 0506  WBC 19.4* 29.7* 27.1*  HGB 9.8* 9.1* 7.9*  HCT 30.9* 29.6* 24.8*  PLT 56* 84* 86*    Coag's No results for input(s): APTT, INR in the last 168 hours.  Sepsis Markers No results for input(s): LATICACIDVEN, PROCALCITON, O2SATVEN in the last 168 hours.  ABG  Recent Labs Lab 07/16/15 1310 07/17/15 0340 07/21/15 0406  PHART 7.495* 7.538* 7.466*  PCO2ART 45.5* 44.9 40.5  PO2ART 125* 64.0* 89.0    Liver Enzymes  Recent Labs Lab 07/15/15 0318 07/16/15 0240 07/17/15 0400  ALBUMIN 1.2* 1.3* 1.2*    Cardiac Enzymes No results for input(s): TROPONINI, PROBNP in the last 168  hours.  Glucose  Recent Labs Lab 07/20/15 0818 07/20/15 1159 07/20/15 1610 07/20/15 2010 07/21/15 0011 07/21/15 0442  GLUCAP 87 93 113* 117* 116* 102*    Imaging No results found.   SIGNIFICANT EVENTS:  07/13/2015 admitted for nausea vomiting, possible drug-induced pancreatitis 07/09/15 went into resp distress: O2 sats 60% on NRBM; transferred to ICU  07/12/15 marked fluid overload unable to wean off bipap 07/16/15 Intubated   STUDIES : [name] Abd (2/17) >> Heterogeneous echogenicity of pancreas which could due pancreatic inflammation; small ascites; fatty liver 2D echo (05/2014) >> EF 50%, CHFpEF CT Abd/pelvis - negative for pancreatic necrosis or abscess or complication related to  Crohn's disease. New moderate bilateral pleural effusions. New moderate volume of abdominal ascites. 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  Echo 2/26 - normal EF, G1DD  IMAGING :   CULTURES: Blood culture (2/21) >> GNR, Serratia BCx (2/22) >> NGTD  ANTIBIOTICS :  Clofazimine 2/17 >> 2/21 Azithromycin 2/17 >> 2/21 Tigecycline 2/17 -2/20 Vanc 2/21>> 2/22 Meropenem 2/21>>2/24 Ceftazidine 2/24>> 2/25 Ceftriaxone 2/25 >> plan through 3/13 per ID recs  LINES:  R PICC --removed LIJ 2/21>> 2/27 L PICC 2/27 >  Right ileostomy    DISCUSSION: 65 y/o male with Crohn's disease, long standing protein calorie malnutrition, ostomy who was admitted on 2/21 with acute pancreatitis and has struggled with acute respiratory failure with hypoxemia likely due to ARDS and pulmonary edema. Intubated 2/28.  ASSESSMENT / PLAN:  PULMONARY A:  Acute hypoxemic respiratory failure secondary: Pulmonary edema, ARDS P:  Continue full vent support. Requiring higher FiO2 last 24 hrs. On 100% -- ABG 7.4/41/89. Now on 80%. Peep 10.  SLP after extubation Xopenex nebulized q8h May need trache?  Pt with worse resp status over 24 hrs. More Fio2. Requiring more sedation. Will paralyze x 48 hrs.   CARDIOVASCULAR A:  Diastolic dysfunction based on 4540/9811 echo, EF nml. Not known to have CAD  P:  Continue Lopressor prn for HR and pressures Scheduled lopressor IV  RENAL A:  Acute kidney injury Hypernatremia -- better Pulm edema P:  Will d/c free water. Lasix 20 mg IV x 1 >> CXR worse congestion.  Monitor BMET and UOP Replace electrolytes as needed   GASTROINTESTINAL A:  Crohn's disease requiring chronic steroids Drug-induced pancreatitis (Tigecycline) - Lipase improved Chronic nausea, vomiting History of antibiotic related diarrhea Severe malnutrition P:  Continue tube feedings Continue stress ulcer prophylaxis  HEMATOLOGIC A:  Thrombocytopenia,  chronic, stable H/O left lower extremity DVT in October 2016. By history, unprovoked. He was on Xarelto outpatient. 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 Abx for 1 yr. Serratia bacteremia -PICC on admission likely source Fungal rash groin P:  per ID: continue ceftriaxone through 3/13; drug holiday for M. abscessus Diflucan 1 dose Clotrimazole cream groin  ENDOCRINE A: Chronic adrenal insufficiency  P:  Continue IV steroids Monitor sugars SSI  NEUROLOGIC A: Anxiety, chronic Sedation needs for vent synchrony P:  RASS goal: -3 Will paralyze pt for next 24-48 hrs.    FAMILY  - Updates: Updated wife at bedside at length. Mentioned about trache and peg if not better soon.   Critical Care time spent with this pt today was 35 minutes.  Pollie Meyer, MD Pulmonary and Critical Care Medicine [facility] Pager: [phone] After 3 pm or if no response, call [phone]  07/21/2015, 8:36 AM

## 2015-07-21 NOTE — Progress Notes (Signed)
ABG was ordered by MD.  Patient is noted to be a difficult stick and also has restricted extremities on both arms.  Spoke with MD about possible femoral aline and MD stated that one might could be placed tonight.  Also spoke with Eston Esters, NP and stated that would pass along information.  Will continue to monitor.

## 2015-07-21 NOTE — Progress Notes (Signed)
Upon my assessment pt was very agitated, trying to pull tubings, O2 in 70s with any movement. Fentanyl was going at 400, Versed at 2.. Oxygen demand increased 80-100%. Pt was placed on Paralytic per protocol. TOF checked and documented, Bis 70 at the base, 30-40s at this time. Versed at 3, Fentanyl at 300. BP is WNL, Pts RASS -4,-5, as a  Goal. Family was given education about the procedure, risk factors and what to look for. All questions were answered. Pt is resting. Will continue to monitor

## 2015-07-22 ENCOUNTER — Inpatient Hospital Stay (HOSPITAL_COMMUNITY): Payer: Managed Care, Other (non HMO)

## 2015-07-22 DIAGNOSIS — K8500 Idiopathic acute pancreatitis without necrosis or infection: Secondary | ICD-10-CM

## 2015-07-22 LAB — GLUCOSE, CAPILLARY
GLUCOSE-CAPILLARY: 110 mg/dL — AB (ref 65–99)
GLUCOSE-CAPILLARY: 132 mg/dL — AB (ref 65–99)
GLUCOSE-CAPILLARY: 133 mg/dL — AB (ref 65–99)
Glucose-Capillary: 122 mg/dL — ABNORMAL HIGH (ref 65–99)
Glucose-Capillary: 123 mg/dL — ABNORMAL HIGH (ref 65–99)
Glucose-Capillary: 125 mg/dL — ABNORMAL HIGH (ref 65–99)

## 2015-07-22 LAB — POCT I-STAT 3, ART BLOOD GAS (G3+)
Acid-Base Excess: 3 mmol/L — ABNORMAL HIGH (ref 0.0–2.0)
Acid-Base Excess: 3 mmol/L — ABNORMAL HIGH (ref 0.0–2.0)
Acid-Base Excess: 3 mmol/L — ABNORMAL HIGH (ref 0.0–2.0)
BICARBONATE: 25.5 meq/L — AB (ref 20.0–24.0)
Bicarbonate: 29.9 mEq/L — ABNORMAL HIGH (ref 20.0–24.0)
Bicarbonate: 28.4 mEq/L — ABNORMAL HIGH (ref 20.0–24.0)
Bicarbonate: 27.8 mEq/L — ABNORMAL HIGH (ref 20.0–24.0)
O2 SAT: 91 %
O2 Saturation: 89 %
O2 Saturation: 98 %
O2 Saturation: 97 %
PCO2 ART: 57.2 mmHg — AB (ref 35.0–45.0)
PCO2 ART: 47.5 mmHg — AB (ref 35.0–45.0)
PCO2 ART: 43.4 mmHg (ref 35.0–45.0)
PH ART: 7.326 — AB (ref 7.350–7.450)
PH ART: 7.364 (ref 7.350–7.450)
PH ART: 7.383 (ref 7.350–7.450)
PH ART: 7.413 (ref 7.350–7.450)
PO2 ART: 67 mmHg — AB (ref 80.0–100.0)
PO2 ART: 58 mmHg — AB (ref 80.0–100.0)
Patient temperature: 98.3
Patient temperature: 98
Patient temperature: 98
Patient temperature: 98
TCO2: 32 mmol/L (ref 0–100)
TCO2: 27 mmol/L (ref 0–100)
TCO2: 30 mmol/L (ref 0–100)
TCO2: 29 mmol/L (ref 0–100)
pCO2 arterial: 44.5 mmHg (ref 35.0–45.0)
pO2, Arterial: 105 mmHg — ABNORMAL HIGH (ref 80.0–100.0)
pO2, Arterial: 94 mmHg (ref 80.0–100.0)

## 2015-07-22 LAB — CBC
HEMATOCRIT: 32 % — AB (ref 39.0–52.0)
Hemoglobin: 9.8 g/dL — ABNORMAL LOW (ref 13.0–17.0)
MCH: 31.2 pg (ref 26.0–34.0)
MCHC: 30.6 g/dL (ref 30.0–36.0)
MCV: 101.9 fL — ABNORMAL HIGH (ref 78.0–100.0)
Platelets: 110 10*3/uL — ABNORMAL LOW (ref 150–400)
RBC: 3.14 MIL/uL — ABNORMAL LOW (ref 4.22–5.81)
RDW: 15.6 % — AB (ref 11.5–15.5)
WBC: 32.6 10*3/uL — ABNORMAL HIGH (ref 4.0–10.5)

## 2015-07-22 LAB — BASIC METABOLIC PANEL
Anion gap: 9 (ref 5–15)
BUN: 53 mg/dL — AB (ref 6–20)
CALCIUM: 8.3 mg/dL — AB (ref 8.9–10.3)
CO2: 28 mmol/L (ref 22–32)
CREATININE: 1.77 mg/dL — AB (ref 0.61–1.24)
Chloride: 107 mmol/L (ref 101–111)
GFR calc non Af Amer: 39 mL/min — ABNORMAL LOW (ref >60)
GFR, EST AFRICAN AMERICAN: 46 mL/min — AB (ref >60)
Glucose, Bld: 122 mg/dL — ABNORMAL HIGH (ref 65–99)
Potassium: 4.3 mmol/L (ref 3.5–5.1)
Sodium: 144 mmol/L (ref 135–145)

## 2015-07-22 MED ORDER — NOREPINEPHRINE BITARTRATE 1 MG/ML IV SOLN
2.0000 ug/min | INTRAVENOUS | Status: DC
  Administered 2015-07-22: 10 ug/min via INTRAVENOUS
  Filled 2015-07-22: qty 16

## 2015-07-22 MED ORDER — ALBUMIN HUMAN 25 % IV SOLN
12.5000 g | Freq: Four times a day (QID) | INTRAVENOUS | Status: AC
  Administered 2015-07-22 – 2015-07-23 (×4): 12.5 g via INTRAVENOUS
  Filled 2015-07-22 (×4): qty 50

## 2015-07-22 MED ORDER — PHENYLEPHRINE HCL 10 MG/ML IJ SOLN
30.0000 ug/min | INTRAMUSCULAR | Status: DC
  Filled 2015-07-22: qty 1

## 2015-07-22 MED ORDER — HYDROCORTISONE NA SUCCINATE PF 100 MG IJ SOLR
50.0000 mg | Freq: Four times a day (QID) | INTRAMUSCULAR | Status: DC
  Administered 2015-07-22 – 2015-07-26 (×14): 50 mg via INTRAVENOUS
  Filled 2015-07-22: qty 1
  Filled 2015-07-22: qty 2
  Filled 2015-07-22 (×10): qty 1
  Filled 2015-07-22: qty 2
  Filled 2015-07-22 (×4): qty 1

## 2015-07-22 MED ORDER — NOREPINEPHRINE BITARTRATE 1 MG/ML IV SOLN
2.0000 ug/min | INTRAVENOUS | Status: DC
  Filled 2015-07-22: qty 4

## 2015-07-22 NOTE — Procedures (Signed)
Central Venous Catheter Insertion Procedure Note DEVARI KETTLEWELL [id number] 05-13-1951  Procedure: Insertion of Central Venous Catheter Indications: Assessment of intravascular volume, Drug and/or fluid administration and Frequent blood sampling  Procedure Details Consent: Risks of procedure as well as the alternatives and risks of each were explained to the (patient/caregiver).  Consent for procedure obtained. Time Out: Verified patient identification, verified procedure, site/side was marked, verified correct patient position, special equipment/implants available, medications/allergies/relevent history reviewed, required imaging and test results available.  Performed  Maximum sterile technique was used including antiseptics, cap, gloves, gown, hand hygiene, mask and sheet. Skin prep: Chlorhexidine; local anesthetic administered A antimicrobial bonded/coated triple lumen catheter was placed in the right internal jugular vein using the Seldinger technique.  Evaluation Blood flow good Complications: No apparent complications Patient did tolerate procedure well. Chest X-ray ordered to verify placement.  CXR: pending.  Bincy Varughese,AG-ACNP Pulmonary & Critical Care Bincy S Varughese 07/22/2015, 2:18 PM  Alyson Reedy, M.D. [facility] Pulmonary/Critical Care Medicine. Pager: [phone]. After hours pager: [phone].

## 2015-07-22 NOTE — Procedures (Signed)
Bronchoscopy Procedure Note JAREL FEULNER [id number] 21-Dec-1950  Procedure: Bronchoscopy Indications: Obtain specimens for culture and/or other diagnostic studies  Procedure Details Consent: Risks of procedure as well as the alternatives and risks of each were explained to the (patient/caregiver).  Consent for procedure obtained. Time Out: Verified patient identification, verified procedure, site/side was marked, verified correct patient position, special equipment/implants available, medications/allergies/relevent history reviewed, required imaging and test results available.  Performed  In preparation for procedure, patient was given 100% FiO2 and bronchoscope lubricated. Sedation: Benzodiazepines, Muscle relaxants and fentanyl  Airway entered and the following bronchi were examined: RUL, RML, RLL, LUL, LLL and Bronchi.   Airway was completely clear.  Sample from the RML. Bronchoscope removed.    Evaluation Hemodynamic Status: BP stable throughout; O2 sats: stable throughout Patient's Current Condition: stable Specimens:  Sent purulent fluid Complications: No apparent complications Patient did tolerate procedure well.   Koren Bound 07/22/2015

## 2015-07-22 NOTE — Progress Notes (Signed)
Spoke with Dr. Molli Knock regarding PICC insertion via left arm.   Concerns with infections in left hand that has required multiple surgeries and antibiotic therapy which is ongoing at this time, therefore I   do not feel it is appropriate to insert a PICC on left side due to ongoing infection.

## 2015-07-22 NOTE — Progress Notes (Signed)
New right internal jugular central venous catheter placed per Dr. Molli Knock. Keep Right PICC line for now per physician. Will re-access catheter removal later.

## 2015-07-22 NOTE — Progress Notes (Signed)
PULMONARY / CRITICAL CARE MEDICINE   Name: Lee Klein MRN: [id number] DOB: 10-06-1950    ADMISSION DATE:  06/27/2015 CONSULTATION DATE: 07/09/15  REFERRING MD: Dr. Oswaldo Done / Internal Medicine  CHIEF COMPLAINT: Abdominal pain and vomiting   BRIEF PATIENT DESCRIPTION : The patient was admitted for acute on chronic nausea and vomiting. He has Crohn's disease. Also with significant comorbidities. 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 He was treated as drug-induced pancreatitis. GI and ID have been following. He had lactic acidosis. Had fluid resuscitation. Had resp distress on 2/21. Chest x-ray with bilateral infiltrates, new from admission. He went into respiratory distress. O2 sats 60% on non breather mask. Transferred to ICU. ABG prior to transfer was 7.26, PCO2 33, PO2 48 on 100% nonrebreather mask. He received 80 mg IV Lasix. He was intubated on 2/28 for prorgressive respiratory failure.  SUBJECTIVE:  Worsening FiO2 and PEEP overnight.  VITAL SIGNS: BP 88/68 mmHg  Pulse 117  Temp(Src) 98.4 F (36.9 C) (Oral)  Resp 8  Ht 5\' 10"  (1.778 m)  Wt 72.2 kg (159 lb 2.8 oz)  BMI 22.84 kg/m2  SpO2 94%   PHYSICAL EXAMINATION:  General: sedated on vent. Episodes of agitation.  HENT: NCAT ETT in place PULM: vent supported breaths. B crackles CV: RRR, no mgr GI: ostomy in place Derm: some arm edema, bruising throughout extensor surfaces, scrotum and inner thigh with erythematous rash Neuro: sedated on vent for my exam More edema today.    HEMODYNAMICS:    VENTILATOR SETTINGS: Vent Mode:  [-] PRVC FiO2 (%):  [60 %-80 %] 60 % Set Rate:  [20 bmp] 20 bmp Vt Set:  [450 mL] 450 mL PEEP:  [10 cmH20] 10 cmH20 Plateau Pressure:  [35 cmH20-41 cmH20] 36 cmH20  INTAKE / OUTPUT: I/O last 3 completed shifts: In: 3247 [I.V.:1310.5; NG/GT:1836.5; IV Piggyback:100] Out: 2560 [Urine:1660; Stool:900]   LABS:  BMET  Recent Labs Lab  07/20/15 0506 07/21/15 1054 07/22/15 0452  NA 145 140 144  K 3.7 3.3* 4.3  CL 109 104 107  CO2 31 27 28   BUN 53* 47* 53*  CREATININE 1.76* 1.51* 1.77*  GLUCOSE 87 117* 122*   Electrolytes  Recent Labs Lab 07/16/15 0240  07/17/15 0400  07/20/15 0506 07/21/15 1054 07/22/15 0452  CALCIUM 7.8*  < > 7.8*  < > 7.9* 7.8* 8.3*  MG 1.9  --   --   --   --   --   --   PHOS 3.3  3.3  --  3.1  --   --   --   --   < > = values in this interval not displayed.  CBC  Recent Labs Lab 07/20/15 0506 07/21/15 1054 07/22/15 0452  WBC 27.1* 30.4* 32.6*  HGB 7.9* 9.2* 9.8*  HCT 24.8* 28.3* 32.0*  PLT 86* 100* 110*   Coag's No results for input(s): APTT, INR in the last 168 hours.  Sepsis Markers No results for input(s): LATICACIDVEN, PROCALCITON, O2SATVEN in the last 168 hours.  ABG  Recent Labs Lab 07/17/15 0340 07/21/15 0406 07/22/15 0730  PHART 7.538* 7.466* 7.326*  PCO2ART 44.9 40.5 57.2*  PO2ART 64.0* 89.0 67.0*    Liver Enzymes  Recent Labs Lab 07/16/15 0240 07/17/15 0400  ALBUMIN 1.3* 1.2*    Cardiac Enzymes No results for input(s): TROPONINI, PROBNP in the last 168 hours.  Glucose  Recent Labs Lab 07/21/15 0442 07/21/15 0756 07/21/15 1147 07/21/15 1610 07/22/15 0045  07/22/15 0416  GLUCAP 102* 92 114* 121* 123* 122*    Imaging Dg Chest Port 1 View  07/22/2015  CLINICAL DATA:  Respiratory failure. EXAM: PORTABLE CHEST 1 VIEW COMPARISON:  07/21/2015. FINDINGS: Endotracheal tube has been partially withdrawn, its tip is in good anatomic position 4.5 cm above the carina. NG tube, right PICC line in stable position. Heart size normal. Diffuse bilateral pulmonary infiltrates consistent with bilateral pulmonary edema and/or pneumonia. Partial clearing from prior exam. No pleural effusion or pneumothorax . IMPRESSION: 1. Endotracheal tube has been partially withdrawn, its tip is in good anatomic position 4.5 cm above the carina. NG tube and right PICC line in  stable position. 2. Persistent but partially clearing diffuse bilateral pulmonary edema and/or pneumonia. Electronically Signed   By: Maisie Fus  Register   On: 07/22/2015 07:13     SIGNIFICANT EVENTS:  07/01/2015 admitted for nausea vomiting, possible drug-induced pancreatitis 07/09/15 went into resp distress: O2 sats 60% on NRBM; transferred to ICU  07/12/15 marked fluid overload unable to wean off bipap 07/16/15 Intubated   STUDIES : [name] Abd (2/17) >> Heterogeneous echogenicity of pancreas which could due pancreatic inflammation; small ascites; fatty liver 2D echo (05/2014) >> EF 50%, CHFpEF CT Abd/pelvis - negative for pancreatic necrosis or abscess or complication related to Crohn's disease. New moderate bilateral pleural effusions. New moderate volume of abdominal ascites. 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  Echo 2/26 - normal EF, G1DD  IMAGING :   CULTURES: Blood culture (2/21) >> GNR, Serratia BCx (2/22) >> NGTD  ANTIBIOTICS :  Clofazimine 2/17 >> 2/21 Azithromycin 2/17 >> 2/21 Tigecycline 2/17 -2/20 Vanc 2/21>> 2/22 Meropenem 2/21>>2/24 Ceftazidine 2/24>> 2/25 Ceftriaxone 2/25 >> plan through 3/13 per ID recs  LINES:  R PICC --removed PICC 3 lumens 3/6>>> LIJ 2/21>> 2/27 L PICC 2/27 > 3/6 Right ileostomy  ETT 2/28>>>  DISCUSSION: 65 y/o male with Crohn's disease, long standing protein calorie malnutrition, ostomy who was admitted on 2/21 with acute pancreatitis and has struggled with acute respiratory failure with hypoxemia likely due to ARDS and pulmonary edema. Intubated 2/28.  ASSESSMENT / PLAN:  PULMONARY A:  Acute hypoxemic respiratory failure secondary: Pulmonary edema, ARDS ?aspiration. P: Continue full vent support. Requiring higher FiO2 last 24 hrs. On 100% -- ABG 7.4/41/89. Now on 80%. Peep 10.  SLP after extubation Xopenex nebulized q8h May need trach? Bronch today to remove plugs. Pt with worse resp status over 24 hrs. More  FiO2. Requiring more sedation. Will paralyze x 24 more hrs.   CARDIOVASCULAR A:  Diastolic dysfunction based on 1610/9604 echo, EF nml. Not known to have CAD  P:  Continue Lopressor prn for HR and pressures Scheduled lopressor IV Levophed and neo for BP support. Hold lasix for now.  RENAL A:  Acute kidney injury Hypernatremia -- better Pulm edema P:  D/C free water. Hold lasix. Monitor BMET and UOP Replace electrolytes as needed  GASTROINTESTINAL A:  Crohn's disease requiring chronic steroids Drug-induced pancreatitis (Tigecycline) - Lipase improved Chronic nausea, vomiting History of antibiotic related diarrhea Severe malnutrition P:  Continue tube feedings Continue stress ulcer prophylaxis  HEMATOLOGIC A:  Thrombocytopenia, chronic, stable H/O left lower extremity DVT in October 2016. By history, unprovoked. He was on Xarelto outpatient. 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 Abx for 1 yr. Serratia bacteremia -PICC on admission likely source Fungal rash groin P:  Per ID: continue ceftriaxone through 3/13; drug holiday for M. abscessus Diflucan 1 dose complete Clotrimazole cream groin  ENDOCRINE A: Chronic adrenal insufficiency  P:  Continue IV steroids Monitor sugars SSI  NEUROLOGIC A: Anxiety, chronic Sedation needs for vent synchrony P:  RASS goal: -3 Maintain paralyzed.  FAMILY  - Updates: Updated wife and daughter at bedside at length.  The patient is critically ill with multiple organ systems failure and requires high complexity decision making for assessment and support, frequent evaluation and titration of  therapies, application of advanced monitoring technologies and extensive interpretation of multiple databases.   Critical Care Time devoted to patient care services described in this note is  35  Minutes. This time reflects time of care of this signee Dr Koren Bound. This critical care time does not reflect procedure time, or teaching time or supervisory time of PA/NP/Med student/Med Resident etc but could involve care discussion time.  Alyson Reedy, M.D. [facility] Pulmonary/Critical Care Medicine. Pager: [phone]. After hours pager: [phone].  07/22/2015, 11:33 AM

## 2015-07-22 NOTE — Progress Notes (Signed)
eLink Physician-Brief Progress Note Patient Name: Lee Klein DOB: [birth year] MRN: [id number]   Date of Service  07/22/2015  HPI/Events of Note  Camera check on patient with ongoing septic shock. Still requiring low-dose vasopressor support to maintain mean arterial pressure. Had extensive conversation with patient's family at bedside regarding multiple clinical questions. Reviewed his serum lab tests which do show mild acute renal failure but overall stability of BUN/creatinine. Did discuss his overall poor nutritional status and need for adequate nutrition to improve oncotic pressure and minimize third spacing of fluid.  eICU Interventions  Continuing current plan of care.     Intervention Category Major Interventions: Shock - evaluation and management  Lawanda Cousins 07/22/2015, 5:00 PM

## 2015-07-22 NOTE — Progress Notes (Signed)
UR Completed. Tanise Russman, RN, BSN.  [phone] 

## 2015-07-22 NOTE — Progress Notes (Signed)
Nutrition Follow-up / Consult  DOCUMENTATION CODES:   Non-severe (moderate) malnutrition in context of chronic illness  INTERVENTION:    TPN dosing per Pharmacy to meet nutrition needs as able.  Recommend replace Cortrak tube in post-pyloric region and resume TF.  NUTRITION DIAGNOSIS:   Malnutrition related to chronic illness as evidenced by mild depletion of muscle mass, mild depletion of body fat.  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 In the ED he was noted to have a significant leukocytosis, lactic acidosis, acute kidney injury, and elevated lipase consistent with pancreatitis and was therefore admitted to the internal medicine teaching service for further evaluation and care. He denies a history of alcohol use or hypertriglyceridemia. He had a cholecystectomy in the past and has had no problems since. He has had pancreatitis in the past.  Patient has been receiving TF via NGT with Vital AF 1.2 at 20 ml/h. Vomited today, so TF is off. Plans to resume TPN. Cortrak tube placed on 2/28 with tip in the distal stomach / pyloric region. Patient was tolerating TF well. RN  reports that Cortrak tube tip was in mouth on Friday, so it was removed and an OGT was placed. Abdominal scan on 3/4 showed OGT in the left upper abdomen, likely in the stomach. Suspect Cortrak tube needs to be replaced in the post-pyloric region for better TF tolerance.   Diet Order:   NPO  Skin:  Reviewed, no issues  Last BM:  3/3  Height:   Ht Readings from Last 1 Encounters:  05/28/15 5\' 10"  (1.778 m)    Weight:   Wt Readings from Last 1 Encounters:  07/22/15 159 lb 2.8 oz (72.2 kg)    Ideal Body Weight:  75.5 kg  BMI:  Body mass index is 22.84 kg/(m^2).  Estimated Nutritional Needs:   Kcal:  1670  Protein:  115-130 gm  Fluid:  >/= 2 L  EDUCATION NEEDS:   Education needs addressed  Joaquin Courts, RD, LDN, CNSC Pager [phone] After Hours Pager [phone]

## 2015-07-22 NOTE — Progress Notes (Signed)
PARENTERAL NUTRITION CONSULT NOTE - INITIAL  Pharmacy Consult for TPN Indication: Intolerance to TFs  Allergies  Allergen Reactions  . Amikacin Other (See Comments)    CAUSED DEAFNESS  . Cefoxitin Nausea Only    ABD. PAIN  . Fish Allergy Nausea And Vomiting  . Iodine Nausea Only    JOINT PAIN  . Ivp Dye [Iodinated Diagnostic Agents] Nausea Only    JOINT PAIN  . Rifampin Nausea And Vomiting and Other (See Comments)    CHILLS  . Shellfish Allergy Swelling  . Vancomycin Other (See Comments)    Red Man Syndrome - was told that it was given to fast.  . Demerol Rash    Patient Measurements: Height: 5\' 10"  (177.8 cm) Weight: 159 lb 2.8 oz (72.2 kg) IBW/kg (Calculated) : 73 Weight from 04/2015 = 69 kg  Vital Signs: Temp: 98 F (36.7 C) (03/06 1225) Temp Source: Oral (03/06 1225) BP: 78/45 mmHg (03/06 1200) Pulse Rate: 96 (03/06 1245) Intake/Output from previous day: 03/05 0701 - 03/06 0700 In: 1704 [I.V.:1017.5; NG/GT:636.5; IV Piggyback:50] Out: 1650 [Urine:1100; Stool:550] Intake/Output from this shift: Total I/O In: 293 [I.V.:198; NG/GT:95] Out: 280 [Urine:280]  Labs:  Recent Labs  07/20/15 0506 07/21/15 1054 07/22/15 0452  WBC 27.1* 30.4* 32.6*  HGB 7.9* 9.2* 9.8*  HCT 24.8* 28.3* 32.0*  PLT 86* 100* 110*     Recent Labs  07/20/15 0506 07/21/15 1054 07/22/15 0452  NA 145 140 144  K 3.7 3.3* 4.3  CL 109 104 107  CO2 31 27 28   GLUCOSE 87 117* 122*  BUN 53* 47* 53*  CREATININE 1.76* 1.51* 1.77*  CALCIUM 7.9* 7.8* 8.3*   Estimated Creatinine Clearance: 44.2 mL/min (by C-G formula based on Cr of 1.77).    Recent Labs  07/22/15 0045 07/22/15 0416 07/22/15 0743  GLUCAP 123* 122* 110*    Medical History: Past Medical History  Diagnosis Date  . Crohn's disease (HCC)   . GERD (gastroesophageal reflux disease)   . Anxiety   . 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 PICC (peripherally inserted central catheter)  in place     right arm  . Osteomyelitis of hand, left, acute (HCC)   . Hearing loss   . Pancreatitis 10/2014  . History of kidney stones   . History of pancreatitis 11/06/2014  . Mycobacterial infection     left hand/wrist  . Thin skin   . Septic arthritis of wrist, left (HCC)   . DVT (deep venous thrombosis) (HCC) 04/2015    left leg  . Pneumonia 04/2015  . Acute pancreatitis 07/08/2015  . AKI (acute kidney injury) (HCC) 07/08/2015  . PICC (peripherally inserted central catheter) in place     Insulin Requirements in the past 24 hours:  2 units of SSI  Assessment: 3 YOM with a history of Crohn's disease s/p ileostomy presented on 07/14/2015 with intermittent epigastric abdominal pain, cramping, and vomiting for 2 months PTA. Per documentation, patient is a poor candidate for surgical J-tube placement and while awaiting for antibiotic changes and resolution of GI symptoms for adequate PO intake, pharmacy consulted to manage TPN for nutritional support. 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 TPN was given from 2/20 to 2/24 and then held several times before being stopped on 2/28 due to worsening fluid status. TFs tried over  past few days and is not tolerating. Pharmacy re-consulted to dose TPN  GI: hx Crohn's / GERD / pancreatitis. TPN stopped on 2/27 due to volume overload. NG tube placed with intubation. Had been on Vital AF 1.2 past few days but not tolerating well. Albumin was low at 1.2 on 3/1. Stool O/P , PPI per tube  Endo: CBGs controlled (90-120s) on SSI. hydrocortisone  Lytes: wnl. No Mg and Phos since 2/28. Follow up labs on 3/7  Renal: SCr 1.77, CrCl ~55ml/min. UOP ok at 0.72ml/kg/hr  Pulm: Intubated on 2/28. FiO2 at 60%  Cards: BP soft and HR 90s. Levo gtt at 29mcg/min  Hepatobil: No LFTs over past week. Follow up tomorrow am labs  Neuro: confused and  agitated on 2/28 so intubated. Fentanyl @ 250, Versed @ 2 and Nimbex gtts @ 3 (TOF 2/4), GCS 3, CPOT 0, RASS -4  ID: [id number] abx for serratia bacteremia/sepsis. Afebrile, WBC up to 32.6  Best Practices: Hep [place]  TPN Access: Attempting to put in new central line today  TPN start date: 2/20 >> 2/24; resumed 2/24 >> 2.27; resumed 3/7 >>  Current Nutrition:  No diet orders Vital AF 1.2 stopped today  Nutritional Goals:  KCal: 1670 Protein: 115-130 g Fluid: > 2 L per day  Plan:  Order TPN labs and RN orders Plan to start TPN tomorrow due to not having baseline labs and order given after cutoff time today Monitor fluid status F/U dietician assessment   Enzo Bi, PharmD, BCPS Clinical Pharmacist Pager [phone] 07/22/2015 1:09 PM

## 2015-07-23 ENCOUNTER — Inpatient Hospital Stay (HOSPITAL_COMMUNITY): Payer: Managed Care, Other (non HMO)

## 2015-07-23 ENCOUNTER — Encounter (HOSPITAL_COMMUNITY): Admission: EM | Disposition: E | Payer: Self-pay | Source: Home / Self Care | Attending: Pulmonary Disease

## 2015-07-23 ENCOUNTER — Inpatient Hospital Stay (HOSPITAL_COMMUNITY): Payer: Managed Care, Other (non HMO) | Admitting: Certified Registered Nurse Anesthetist

## 2015-07-23 DIAGNOSIS — I9789 Other postprocedural complications and disorders of the circulatory system, not elsewhere classified: Secondary | ICD-10-CM

## 2015-07-23 HISTORY — PX: ENDARTERECTOMY: SHX5162

## 2015-07-23 LAB — CBC
HEMATOCRIT: 24.2 % — AB (ref 39.0–52.0)
HEMOGLOBIN: 7.7 g/dL — AB (ref 13.0–17.0)
MCH: 31.4 pg (ref 26.0–34.0)
MCHC: 31.8 g/dL (ref 30.0–36.0)
MCV: 98.8 fL (ref 78.0–100.0)
Platelets: 137 10*3/uL — ABNORMAL LOW (ref 150–400)
RBC: 2.45 MIL/uL — AB (ref 4.22–5.81)
RDW: 15.5 % (ref 11.5–15.5)
WBC: 24.6 10*3/uL — ABNORMAL HIGH (ref 4.0–10.5)

## 2015-07-23 LAB — POCT I-STAT 3, ART BLOOD GAS (G3+)
ACID-BASE EXCESS: 2 mmol/L (ref 0.0–2.0)
Acid-Base Excess: 3 mmol/L — ABNORMAL HIGH (ref 0.0–2.0)
BICARBONATE: 26.4 meq/L — AB (ref 20.0–24.0)
Bicarbonate: 27.3 mEq/L — ABNORMAL HIGH (ref 20.0–24.0)
O2 SAT: 92 %
O2 Saturation: 92 %
PCO2 ART: 38.5 mmHg (ref 35.0–45.0)
PH ART: 7.455 — AB (ref 7.350–7.450)
PO2 ART: 58 mmHg — AB (ref 80.0–100.0)
Patient temperature: 97
TCO2: 28 mmol/L (ref 0–100)
TCO2: 28 mmol/L (ref 0–100)
pCO2 arterial: 40 mmHg (ref 35.0–45.0)
pH, Arterial: 7.427 (ref 7.350–7.450)
pO2, Arterial: 61 mmHg — ABNORMAL LOW (ref 80.0–100.0)

## 2015-07-23 LAB — CBC WITH DIFFERENTIAL/PLATELET
BASOS ABS: 0 10*3/uL (ref 0.0–0.1)
BASOS PCT: 0 %
EOS PCT: 0 %
Eosinophils Absolute: 0 10*3/uL (ref 0.0–0.7)
HEMATOCRIT: 23.5 % — AB (ref 39.0–52.0)
HEMOGLOBIN: 7.6 g/dL — AB (ref 13.0–17.0)
Lymphocytes Relative: 5 %
Lymphs Abs: 1.1 10*3/uL (ref 0.7–4.0)
MCH: 31.9 pg (ref 26.0–34.0)
MCHC: 32.3 g/dL (ref 30.0–36.0)
MCV: 98.7 fL (ref 78.0–100.0)
MONO ABS: 0.7 10*3/uL (ref 0.1–1.0)
MONOS PCT: 3 %
Neutro Abs: 18 10*3/uL — ABNORMAL HIGH (ref 1.7–7.7)
Neutrophils Relative %: 91 %
Platelets: 97 10*3/uL — ABNORMAL LOW (ref 150–400)
RBC: 2.38 MIL/uL — ABNORMAL LOW (ref 4.22–5.81)
RDW: 16 % — ABNORMAL HIGH (ref 11.5–15.5)
WBC: 19.8 10*3/uL — ABNORMAL HIGH (ref 4.0–10.5)

## 2015-07-23 LAB — COMPREHENSIVE METABOLIC PANEL
ALBUMIN: 1.9 g/dL — AB (ref 3.5–5.0)
ALK PHOS: 146 U/L — AB (ref 38–126)
ALT: 37 U/L (ref 17–63)
AST: 30 U/L (ref 15–41)
Anion gap: 11 (ref 5–15)
BUN: 63 mg/dL — AB (ref 6–20)
CALCIUM: 8.3 mg/dL — AB (ref 8.9–10.3)
CHLORIDE: 108 mmol/L (ref 101–111)
CO2: 25 mmol/L (ref 22–32)
CREATININE: 1.95 mg/dL — AB (ref 0.61–1.24)
GFR calc Af Amer: 41 mL/min — ABNORMAL LOW (ref >60)
GFR calc non Af Amer: 35 mL/min — ABNORMAL LOW (ref >60)
GLUCOSE: 121 mg/dL — AB (ref 65–99)
Potassium: 4.3 mmol/L (ref 3.5–5.1)
SODIUM: 144 mmol/L (ref 135–145)
Total Bilirubin: 0.9 mg/dL (ref 0.3–1.2)
Total Protein: 4 g/dL — ABNORMAL LOW (ref 6.5–8.1)

## 2015-07-23 LAB — PHOSPHORUS: Phosphorus: 4.3 mg/dL (ref 2.5–4.6)

## 2015-07-23 LAB — DIFFERENTIAL
Basophils Absolute: 0 10*3/uL (ref 0.0–0.1)
Basophils Relative: 0 %
EOS ABS: 0 10*3/uL (ref 0.0–0.7)
EOS PCT: 0 %
LYMPHS ABS: 1 10*3/uL (ref 0.7–4.0)
LYMPHS PCT: 4 %
MONOS PCT: 4 %
Monocytes Absolute: 0.9 10*3/uL (ref 0.1–1.0)
Neutro Abs: 22.7 10*3/uL — ABNORMAL HIGH (ref 1.7–7.7)
Neutrophils Relative %: 92 %

## 2015-07-23 LAB — GLUCOSE, CAPILLARY
GLUCOSE-CAPILLARY: 126 mg/dL — AB (ref 65–99)
GLUCOSE-CAPILLARY: 104 mg/dL — AB (ref 65–99)
GLUCOSE-CAPILLARY: 109 mg/dL — AB (ref 65–99)
GLUCOSE-CAPILLARY: 90 mg/dL (ref 65–99)
Glucose-Capillary: 100 mg/dL — ABNORMAL HIGH (ref 65–99)
Glucose-Capillary: 103 mg/dL — ABNORMAL HIGH (ref 65–99)

## 2015-07-23 LAB — TRIGLYCERIDES: TRIGLYCERIDES: 87 mg/dL (ref <150)

## 2015-07-23 LAB — PROTIME-INR
INR: 1.56 — ABNORMAL HIGH (ref 0.00–1.49)
Prothrombin Time: 18.7 seconds — ABNORMAL HIGH (ref 11.6–15.2)

## 2015-07-23 LAB — PREALBUMIN: Prealbumin: 8 mg/dL — ABNORMAL LOW (ref 18–38)

## 2015-07-23 LAB — APTT: aPTT: 30 seconds (ref 24–37)

## 2015-07-23 LAB — TROPONIN I
Troponin I: 0.03 ng/mL (ref <0.031)
Troponin I: <0.03 ng/mL (ref <0.031)

## 2015-07-23 LAB — MAGNESIUM: Magnesium: 2 mg/dL (ref 1.7–2.4)

## 2015-07-23 LAB — FIBRINOGEN: Fibrinogen: 262 mg/dL (ref 204–475)

## 2015-07-23 SURGERY — ENDARTERECTOMY, CAROTID
Laterality: Right

## 2015-07-23 MED ORDER — LABETALOL HCL 5 MG/ML IV SOLN
20.0000 mg | Freq: Once | INTRAVENOUS | Status: AC
  Administered 2015-07-23 (×2): 10 mg via INTRAVENOUS

## 2015-07-23 MED ORDER — LIDOCAINE-EPINEPHRINE (PF) 1 %-1:200000 IJ SOLN
INTRAMUSCULAR | Status: AC
  Filled 2015-07-23: qty 30

## 2015-07-23 MED ORDER — ARTIFICIAL TEARS OP OINT
TOPICAL_OINTMENT | OPHTHALMIC | Status: AC
  Filled 2015-07-23: qty 7

## 2015-07-23 MED ORDER — PHENYLEPHRINE 40 MCG/ML (10ML) SYRINGE FOR IV PUSH (FOR BLOOD PRESSURE SUPPORT)
PREFILLED_SYRINGE | INTRAVENOUS | Status: AC
  Filled 2015-07-23: qty 20

## 2015-07-23 MED ORDER — ROCURONIUM BROMIDE 100 MG/10ML IV SOLN
INTRAVENOUS | Status: DC | PRN
  Administered 2015-07-23: 30 mg via INTRAVENOUS
  Administered 2015-07-23: 50 mg via INTRAVENOUS
  Administered 2015-07-24: 20 mg via INTRAVENOUS
  Administered 2015-07-24: 30 mg via INTRAVENOUS

## 2015-07-23 MED ORDER — LABETALOL HCL 5 MG/ML IV SOLN
INTRAVENOUS | Status: AC
  Administered 2015-07-23: 10 mg via INTRAVENOUS
  Filled 2015-07-23: qty 4

## 2015-07-23 MED ORDER — SODIUM CHLORIDE 0.9 % IV SOLN
1.0000 g | Freq: Two times a day (BID) | INTRAVENOUS | Status: DC
Start: 2015-07-23 — End: 2015-07-27
  Administered 2015-07-23 – 2015-07-27 (×7): 1 g via INTRAVENOUS
  Filled 2015-07-23 (×9): qty 1

## 2015-07-23 MED ORDER — FENTANYL CITRATE (PF) 100 MCG/2ML IJ SOLN
INTRAMUSCULAR | Status: DC | PRN
Start: 2015-07-23 — End: 2015-07-24
  Administered 2015-07-23: 50 ug via INTRAVENOUS
  Administered 2015-07-23: 100 ug via INTRAVENOUS
  Administered 2015-07-24 (×4): 25 ug via INTRAVENOUS

## 2015-07-23 MED ORDER — MIDAZOLAM HCL 2 MG/2ML IJ SOLN
INTRAMUSCULAR | Status: AC
  Filled 2015-07-23: qty 2

## 2015-07-23 MED ORDER — LIDOCAINE HCL (CARDIAC) 20 MG/ML IV SOLN: INTRAVENOUS | Status: DC | PRN

## 2015-07-23 MED ORDER — ARTIFICIAL TEARS OP OINT
TOPICAL_OINTMENT | OPHTHALMIC | Status: DC | PRN
  Administered 2015-07-23: 1 via OPHTHALMIC

## 2015-07-23 MED ORDER — LACTATED RINGERS IV SOLN
INTRAVENOUS | Status: DC | PRN
  Administered 2015-07-23: 23:00:00 via INTRAVENOUS

## 2015-07-23 MED ORDER — MIDAZOLAM HCL 5 MG/5ML IJ SOLN
INTRAMUSCULAR | Status: DC | PRN
  Administered 2015-07-23: 2 mg via INTRAVENOUS

## 2015-07-23 MED ORDER — FAT EMULSION 20 % IV EMUL
240.0000 mL | INTRAVENOUS | Status: AC
  Filled 2015-07-23: qty 250

## 2015-07-23 MED ORDER — SODIUM CHLORIDE 0.9 % IV SOLN
INTRAVENOUS | Status: DC | PRN
  Administered 2015-07-23: 23:00:00 via INTRAVENOUS

## 2015-07-23 MED ORDER — ROCURONIUM BROMIDE 50 MG/5ML IV SOLN
INTRAVENOUS | Status: AC
  Filled 2015-07-23: qty 2

## 2015-07-23 MED ORDER — TRACE MINERALS CR-CU-MN-SE-ZN 10-1000-500-60 MCG/ML IV SOLN
INTRAVENOUS | Status: AC
  Filled 2015-07-23: qty 960

## 2015-07-23 MED ORDER — FENTANYL CITRATE (PF) 250 MCG/5ML IJ SOLN
INTRAMUSCULAR | Status: AC
  Filled 2015-07-23: qty 5

## 2015-07-23 SURGICAL SUPPLY — 44 items
ADH SKN CLS APL DERMABOND .7 (GAUZE/BANDAGES/DRESSINGS) ×1
BANDAGE ELASTIC 4 VELCRO ST LF (GAUZE/BANDAGES/DRESSINGS) IMPLANT
CANISTER SUCTION 2500CC (MISCELLANEOUS) ×3 IMPLANT
CLIP TI MEDIUM 6 (CLIP) ×3 IMPLANT
CLIP TI WIDE RED SMALL 6 (CLIP) ×3 IMPLANT
DECANTER SPIKE VIAL GLASS SM (MISCELLANEOUS) ×1 IMPLANT
DERMABOND ADVANCED (GAUZE/BANDAGES/DRESSINGS) ×2
DERMABOND ADVANCED .7 DNX12 (GAUZE/BANDAGES/DRESSINGS) ×1 IMPLANT
ELECT REM PT RETURN 9FT ADLT (ELECTROSURGICAL) ×3
ELECTRODE REM PT RTRN 9FT ADLT (ELECTROSURGICAL) ×1 IMPLANT
GAUZE SPONGE 4X4 12PLY STRL (GAUZE/BANDAGES/DRESSINGS) IMPLANT
GLOVE BIO SURGEON STRL SZ 6.5 (GLOVE) ×4 IMPLANT
GLOVE BIO SURGEONS STRL SZ 6.5 (GLOVE) ×2
GLOVE BIOGEL PI IND STRL 6.5 (GLOVE) ×1 IMPLANT
GLOVE BIOGEL PI IND STRL 7.0 (GLOVE) IMPLANT
GLOVE BIOGEL PI IND STRL 7.5 (GLOVE) ×1 IMPLANT
GLOVE BIOGEL PI INDICATOR 6.5 (GLOVE) ×2
GLOVE BIOGEL PI INDICATOR 7.0 (GLOVE) ×2
GLOVE BIOGEL PI INDICATOR 7.5 (GLOVE) ×6
GLOVE SURG SS PI 7.5 STRL IVOR (GLOVE) ×9 IMPLANT
GOWN STRL REUS W/ TWL LRG LVL3 (GOWN DISPOSABLE) ×2 IMPLANT
GOWN STRL REUS W/ TWL XL LVL3 (GOWN DISPOSABLE) ×1 IMPLANT
GOWN STRL REUS W/TWL LRG LVL3 (GOWN DISPOSABLE) ×9
GOWN STRL REUS W/TWL XL LVL3 (GOWN DISPOSABLE) ×3
HEMOSTAT SNOW SURGICEL 2X4 (HEMOSTASIS) ×2 IMPLANT
KIT BASIN OR (CUSTOM PROCEDURE TRAY) ×3 IMPLANT
KIT ROOM TURNOVER OR (KITS) ×3 IMPLANT
LIQUID BAND (GAUZE/BANDAGES/DRESSINGS) ×1 IMPLANT
NDL HYPO 25GX1X1/2 BEV (NEEDLE) ×1 IMPLANT
NEEDLE HYPO 25GX1X1/2 BEV (NEEDLE) IMPLANT
NS IRRIG 1000ML POUR BTL (IV SOLUTION) ×3 IMPLANT
PACK CAROTID (CUSTOM PROCEDURE TRAY) ×2 IMPLANT
PACK CV ACCESS (CUSTOM PROCEDURE TRAY) ×1 IMPLANT
PAD ARMBOARD 7.5X6 YLW CONV (MISCELLANEOUS) ×6 IMPLANT
SUT ETHILON 3 0 PS 1 (SUTURE) IMPLANT
SUT PROLENE 5 0 C 1 24 (SUTURE) ×6 IMPLANT
SUT PROLENE 6 0 BV (SUTURE) ×7 IMPLANT
SUT VIC AB 2-0 CT1 27 (SUTURE) ×3
SUT VIC AB 2-0 CT1 TAPERPNT 27 (SUTURE) IMPLANT
SUT VIC AB 3-0 SH 27 (SUTURE) ×6
SUT VIC AB 3-0 SH 27X BRD (SUTURE) ×2 IMPLANT
SUT VICRYL 4-0 PS2 18IN ABS (SUTURE) ×2 IMPLANT
UNDERPAD 30X30 INCONTINENT (UNDERPADS AND DIAPERS) ×3 IMPLANT
WATER STERILE IRR 1000ML POUR (IV SOLUTION) ×3 IMPLANT

## 2015-07-23 NOTE — Progress Notes (Signed)
eLink Physician-Brief Progress Note Patient Name: Lee Klein DOB: 1950/12/26 MRN: [id number]   Date of Service  02-Aug-2015  HPI/Events of Note  Asked to review EKG for myonecrosis. EKG reveals nonspecific ST abnormality, therefore, is not diagnostic.   eICU Interventions  Will cycle Troponin.     Intervention Category Intermediate Interventions: Other:  Lenell Antu 02-Aug-2015, 6:31 AM

## 2015-07-23 NOTE — Progress Notes (Signed)
Pharmacy Antibiotic Note  Lee Klein is a 65 y.o. male admitted on 06/29/2015 now with serratia bacteremia.  Pharmacy has been consulted for meropenem dosing due to concern for inducible b-lactamase production by serratia. WBC down to 24.6, Afebrile.   Plan: Start meropenem 1 gm IV Q 12 hours  Monitor CBC, renal fx, cultures and clinical progress  Height: 5\' 10"  (177.8 cm) Weight: 156 lb 1.4 oz (70.8 kg) IBW/kg (Calculated) : 73  Temp (24hrs), Avg:98.6 F (37 C), Min:98 F (36.7 C), Max:98.8 F (37.1 C)   Recent Labs Lab 07/18/15 0421 07/19/15 0421 07/20/15 0506 07/21/15 1054 07/22/15 0452 Aug 07, 2015 0306  WBC 29.7*  --  27.1* 30.4* 32.6* 24.6*  CREATININE 2.05* 2.03* 1.76* 1.51* 1.77* 1.95*    Estimated Creatinine Clearance: 39.3 mL/min (by C-G formula based on Cr of 1.95).    Allergies  Allergen Reactions  . Amikacin Other (See Comments)    CAUSED DEAFNESS  . Cefoxitin Nausea Only    ABD. PAIN  . Fish Allergy Nausea And Vomiting  . Iodine Nausea Only    JOINT PAIN  . Ivp Dye [Iodinated Diagnostic Agents] Nausea Only    JOINT PAIN  . Rifampin Nausea And Vomiting and Other (See Comments)    CHILLS  . Shellfish Allergy Swelling  . Vancomycin Other (See Comments)    Red Man Syndrome - was told that it was given to fast.  . Demerol Rash    Antimicrobials this admission: Clofazimine and azith stopped 2/21 Vanc 2/21 >> 2/22 Merrem 2/21 >> 2/24; 3/7>>  Fortaz 2/24 >>2/25 CTX 2/25 >> 3/7 Fluc x1 3/3  Dose adjustments this admission: None   Microbiology results: 2/21 UA - clean 2/21 BCx: serratia (R to ancef) 2/22 repeat BCx: negative 2/21 MRSA PCR neg 3/6 BAL>>NGTD   Thank you for allowing pharmacy to be a part of this patient's care.  Lee Klein 2015-08-07 10:54 AM

## 2015-07-23 NOTE — Progress Notes (Signed)
eLink Physician-Brief Progress Note Patient Name: Lee Klein DOB: [birth year] MRN: [id number]   Date of Service  13-Aug-2015  HPI/Events of Note  Lengthy discussion with vascular surgeon on call. I also spoke with the patient's wife at bedside  Via camera. Patient's right central venous catheter is actually going into the arterial system. This appears to be right above the clavicle. Wife was updated regarding this as well as the need for removal and potential need for surgical intervention tonight to remove the catheter safely and also close off the insertion sites. Vascular surgery will assess the patient at bedside once labs have returned. I have spoken with the wife requesting that she remain a bedside until his arrival.  eICU Interventions  1. Holding on initiation of TPN to allow for use of double lumen PICC 2. Stat CBC, INR, PTT, & fibrinogen 3. Stat type and screen        Lawanda Cousins 08/13/15, 8:43 PM

## 2015-07-23 NOTE — Progress Notes (Signed)
PULMONARY / CRITICAL CARE MEDICINE   Name: Lee Klein MRN: [id number] DOB: 1950-10-18    ADMISSION DATE:  07/12/2015 CONSULTATION DATE: 07/09/15  REFERRING MD: Dr. Oswaldo Done / Internal Medicine  CHIEF COMPLAINT: Abdominal pain and vomiting   BRIEF PATIENT DESCRIPTION : The patient was admitted for acute on chronic nausea and vomiting. He has Crohn's disease. Also with significant comorbidities. 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 He was treated as drug-induced pancreatitis. GI and ID have been following. He had lactic acidosis. Had fluid resuscitation. Had resp distress on 2/21. Chest x-ray with bilateral infiltrates, new from admission. He went into respiratory distress. O2 sats 60% on non breather mask. Transferred to ICU. ABG prior to transfer was 7.26, PCO2 33, PO2 48 on 100% nonrebreather mask. He received 80 mg IV Lasix. He was intubated on 2/28 for prorgressive respiratory failure.  SUBJECTIVE:  Worsening FiO2 and PEEP overnight.  VITAL SIGNS: BP 160/81 mmHg  Pulse 127  Temp(Src) 98.5 F (36.9 C) (Oral)  Resp 30  Ht 5\' 10"  (1.778 m)  Wt 70.8 kg (156 lb 1.4 oz)  BMI 22.40 kg/m2  SpO2 97%   PHYSICAL EXAMINATION:  General: sedated on vent. Episodes of agitation.  HENT: NCAT ETT in place PULM: vent supported breaths. B crackles CV: RRR, no mgr GI: ostomy in place Derm: some arm edema, bruising throughout extensor surfaces, scrotum and inner thigh with erythematous rash Neuro: sedated on vent for my exam More edema today.    HEMODYNAMICS:    VENTILATOR SETTINGS: Vent Mode:  [-] PRVC FiO2 (%):  [60 %-80 %] 60 % Set Rate:  [30 bmp] 30 bmp Vt Set:  [450 mL] 450 mL PEEP:  [8 cmH20-10 cmH20] 8 cmH20 Plateau Pressure:  [37 cmH20-45 cmH20] 38 cmH20  INTAKE / OUTPUT: I/O last 3 completed shifts: In: 3368.3 [I.V.:2316.8; NG/GT:801.5; IV Piggyback:250] Out: 1710 [Urine:1160; Stool:550]   LABS:  BMET  Recent  Labs Lab 07/21/15 1054 07/22/15 0452 Aug 15, 2015 0306  NA 140 144 144  K 3.3* 4.3 4.3  CL 104 107 108  CO2 27 28 25   BUN 47* 53* 63*  CREATININE 1.51* 1.77* 1.95*  GLUCOSE 117* 122* 121*   Electrolytes  Recent Labs Lab 07/17/15 0400  07/21/15 1054 07/22/15 0452 Aug 15, 2015 0306  CALCIUM 7.8*  < > 7.8* 8.3* 8.3*  MG  --   --   --   --  2.0  PHOS 3.1  --   --   --  4.3  < > = values in this interval not displayed.  CBC  Recent Labs Lab 07/21/15 1054 07/22/15 0452 08-15-15 0306  WBC 30.4* 32.6* 24.6*  HGB 9.2* 9.8* 7.7*  HCT 28.3* 32.0* 24.2*  PLT 100* 110* 137*   Coag's No results for input(s): APTT, INR in the last 168 hours.  Sepsis Markers No results for input(s): LATICACIDVEN, PROCALCITON, O2SATVEN in the last 168 hours.  ABG  Recent Labs Lab 07/22/15 1810 Aug 15, 2015 0330 08-15-15 0859  PHART 7.413 7.427 7.455*  PCO2ART 43.4 40.0 38.5  PO2ART 94.0 61.0* 58.0*    Liver Enzymes  Recent Labs Lab 07/17/15 0400 08/15/15 0306  AST  --  30  ALT  --  37  ALKPHOS  --  146*  BILITOT  --  0.9  ALBUMIN 1.2* 1.9*    Cardiac Enzymes  Recent Labs Lab 2015/08/15 0640  TROPONINI <0.03    Glucose  Recent Labs Lab 07/22/15 1218 07/22/15 1530 07/22/15 1903  07/22/15 2314 07/18/2015 0326 08/05/2015 0834  GLUCAP 125* 132* 133* 126* 104* 100*    Imaging Dg Chest Port 1 View  08/01/2015  CLINICAL DATA:  Intubation. EXAM: PORTABLE CHEST 1 VIEW COMPARISON:  07/22/2015.  05/09/2015. FINDINGS: Endotracheal tube, NG tube, right IJ line, right PICC line in stable position . Stable fullness of the superior mediastinum. When the patient is clinically capable a PA and lateral chest x-ray suggested for further evaluation . Stable heart size. Diffuse bilateral pulmonary infiltrates/ edema again noted. Slight worsening on the left noted. Small pleural effusions cannot be excluded. No pneumothorax. IMPRESSION: 1.  Lines and tubes in stable position. 2. Stable fullness of the  superior mediastinum, possibly vascular. However when the patient is clinically capable a PA and lateral chest x-ray suggested for further evaluation. 3. Diffuse bilateral pulmonary infiltrates/edema again noted. Slight worsening on the left. Small bilateral pleural effusions cannot be excluded. 4.  Cardiomegaly again noted. Electronically Signed   By: Maisie Fus  Register   On: 07/27/2015 07:19   Dg Chest Port 1 View  07/22/2015  CLINICAL DATA:  Status post central line placement EXAM: PORTABLE CHEST 1 VIEW COMPARISON:  07/22/2015 FINDINGS: Cardiac shadow is stable. The endotracheal tube, right-sided PICC line and nasogastric catheter are stable. A new right jugular central line is seen in the mid superior vena cava. No pneumothorax is noted. Diffuse bilateral pulmonary edema is again seen and stable. IMPRESSION: Status post right central line without pneumothorax. The remainder of the exam is stable. Electronically Signed   By: Alcide Clever M.D.   On: 07/22/2015 15:30     SIGNIFICANT EVENTS:  16-Jul-2015 admitted for nausea vomiting, possible drug-induced pancreatitis 07/09/15 went into resp distress: O2 sats 60% on NRBM; transferred to ICU  07/12/15 marked fluid overload unable to wean off bipap 07/16/15 Intubated 3/7 attempted to get off paralytics with significant desaturation and asynchrony   STUDIES : [name] Abd (2/17) >> Heterogeneous echogenicity of pancreas which could due pancreatic inflammation; small ascites; fatty liver 2D echo (05/2014) >> EF 50%, CHFpEF CT Abd/pelvis - negative for pancreatic necrosis or abscess or complication related to Crohn's disease. New moderate bilateral pleural effusions. New moderate volume of abdominal ascites. 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  Echo 2/26 - normal EF, G1DD  IMAGING :   CULTURES: Blood culture (2/21) >> GNR, Serratia BCx (2/22) >> NGTD  ANTIBIOTICS :  Clofazimine 2/17 >> 2/21 Azithromycin 2/17 >> 2/21 Tigecycline 2/17  -2/20 Vanc 2/21>> 2/22 Meropenem 2/21>>2/24>>>3/7>>> Ceftazidine 2/24>> 2/25 Ceftriaxone 2/25 >> 3/7  LINES:  R PICC --removed PICC 3 lumens 3/6>>> LIJ 2/21>> 2/27 L PICC 2/27 > 3/6 Right ileostomy  ETT 2/28>>> R IJ TLC 3/6>>>  DISCUSSION: 65 y/o male with Crohn's disease, long standing protein calorie malnutrition, ostomy who was admitted on 2/21 with acute pancreatitis and has struggled with acute respiratory failure with hypoxemia likely due to ARDS and pulmonary edema. Intubated 2/28.  ASSESSMENT/PLAN:  PULMONARY A:  Acute hypoxemic respiratory failure secondary: Pulmonary edema, ARDS ?aspiration. P: Continue full vent support. Requiring higher FiO2 last 24 hrs. On 100% -- ABG 7.4/41/89. Now on 80%. Peep 10.  Reparalyze Xopenex nebulized q8h May need trach, family discussing.  CARDIOVASCULAR A:  Diastolic dysfunction based on 9147/8295 echo, EF nml. Not known to have CAD  P:  D/C lopressor. Levophed and neo for BP support. Hold lasix for now.  RENAL A:  Acute kidney injury Hypernatremia -- better Pulm edema P:  D/C free water. Hold lasix.  Monitor BMET and UOP Replace electrolytes as needed  GASTROINTESTINAL A:  Crohn's disease requiring chronic steroids Drug-induced pancreatitis (Tigecycline) - Lipase improved Chronic nausea, vomiting History of antibiotic related diarrhea Severe malnutrition P:  D/C tube feedings TPN to start tonight. Continue stress ulcer prophylaxis  HEMATOLOGIC A:  Thrombocytopenia, chronic, stable H/O left lower extremity DVT in October 2016. By history, unprovoked. He was on Xarelto outpatient. 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 Abx for 1 yr. Serratia bacteremia -PICC on admission likely source Fungal rash groin P:  Per ID:  D/C rocephin. Restart merem. Diflucan 1 dose complete Clotrimazole cream groin  ENDOCRINE A: Chronic adrenal insufficiency  P:  Continue IV steroids Monitor sugars SSI  NEUROLOGIC A: Anxiety, chronic Sedation needs for vent synchrony P:  RASS goal: -3 Maintain paralyzed another 24 hours.  FAMILY  - Updates: Updated wife and daughter at bedside at length.  The patient is critically ill with multiple organ systems failure and requires high complexity decision making for assessment and support, frequent evaluation and titration of therapies, application of advanced monitoring technologies and extensive interpretation of multiple databases.   Critical Care Time devoted to patient care services described in this note is  35  Minutes. This time reflects time of care of this signee Dr Koren Bound. This critical care time does not reflect procedure time, or teaching time or supervisory time of PA/NP/Med student/Med Resident etc but could involve care discussion time.  Alyson Reedy, M.D. [facility] Pulmonary/Critical Care Medicine. Pager: [phone]. After hours pager: [phone].  07/22/2015, 10:08 AM

## 2015-07-23 NOTE — Progress Notes (Signed)
PARENTERAL NUTRITION CONSULT NOTE - INITIAL  Pharmacy Consult for TPN Indication: Intolerance to TFs  Allergies  Allergen Reactions  . Amikacin Other (See Comments)    CAUSED DEAFNESS  . Cefoxitin Nausea Only    ABD. PAIN  . Fish Allergy Nausea And Vomiting  . Iodine Nausea Only    JOINT PAIN  . Ivp Dye [Iodinated Diagnostic Agents] Nausea Only    JOINT PAIN  . Rifampin Nausea And Vomiting and Other (See Comments)    CHILLS  . Shellfish Allergy Swelling  . Vancomycin Other (See Comments)    Red Man Syndrome - was told that it was given to fast.  . Demerol Rash    Patient Measurements: Height:  (177.8 cm) Weight: 156 lb 1.4 oz (70.8 kg) IBW/kg (Calculated) : 73 Weight from 04/2015 = 69 kg  Vital Signs: Temp: 98.5 F (36.9 C) (03/07 0327) Temp Source: Oral (03/07 0327) BP: 105/60 mmHg (03/07 0800) Pulse Rate: 73 (03/07 0800) Intake/Output from previous day: 03/06 0701 - 03/07 0700 In: 2217 [I.V.:1802; NG/GT:215; IV Piggyback:200] Out: 985 [Urine:785; Stool:200] Intake/Output from this shift: Total I/O In: -  Out: 150 [Urine:150]  Labs:  Recent Labs  07/21/15 1054 07/22/15 0452 08/12/2015 0306  WBC 30.4* 32.6* 24.6*  HGB 9.2* 9.8* 7.7*  HCT 28.3* 32.0* 24.2*  PLT 100* 110* 137*     Recent Labs  07/21/15 1054 07/22/15 0452 08/07/2015 0306  NA 140 144 144  K 3.3* 4.3 4.3  CL 104 107 108  CO2 GLUCOSE 117* 122* 121*  BUN 47* 53* 63*  CREATININE 1.51* 1.77* 1.95*  CALCIUM 7.8* 8.3* 8.3*  MG  --   --  2.0  PHOS  --   --  4.3  PROT  --   --  4.0*  ALBUMIN  --   --  1.9*  AST  --   --  30  ALT  --   --  37  ALKPHOS  --   --  146*  BILITOT  --   --  0.9  PREALBUMIN  --   --  8.0*  TRIG  --   --  87   Estimated Creatinine Clearance: 39.3 mL/min (by C-G formula based on Cr of 1.95).    Recent Labs  07/22/15 2314 08/12/2015 0326 08/13/2015 0834  GLUCAP 126* 104* 100*    Medical History: Past Medical History  Diagnosis Date  .  Crohn's disease (HCC)   . GERD (gastroesophageal reflux disease)   . Anxiety   . 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 PICC (peripherally inserted central catheter) in place     right arm  . Osteomyelitis of hand, left, acute (HCC)   . Hearing loss   . Pancreatitis 10/2014  . History of kidney stones   . History of pancreatitis 11/06/2014  . Mycobacterial infection     left hand/wrist  . Thin skin   . Septic arthritis of wrist, left (HCC)   . DVT (deep venous thrombosis) (HCC) 04/2015    left leg  . Pneumonia 04/2015  . Acute pancreatitis 07/08/2015  . AKI (acute kidney injury) (HCC) 07/08/2015  . PICC (peripherally inserted central catheter) in place     Insulin Requirements in the past 24 hours:  5 units of SSI  Assessment: 49 YOM with a history of Crohn's disease s/p ileostomy presented on 06/26/2015 with intermittent epigastric abdominal pain, cramping, and vomiting for 2 months PTA.  Per documentation, patient is a poor candidate for surgical J-tube placement and while awaiting for antibiotic changes and resolution of GI symptoms for adequate PO intake, pharmacy consulted to manage TPN for nutritional support. 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 TPN was given from 2/20 to 2/24 and then held several times before being stopped on 2/28 due to worsening fluid status. TFs tried over past few days and is not tolerating. Pharmacy re-consulted to dose TPN  GI: hx Crohn's / GERD / pancreatitis. TPN stopped on 2/27 due to volume overload. NG tube placed with intubation. Had been on Vital AF 1.2 past few days but not tolerating well. Albumin low at 1.9. Prealbumin low at 8.0. Stool O/P , PPI per tube  Endo: CBGs controlled (100-130s) on SSI. hydrocortisone  Lytes: wnl. Mg and Phos ok. CoCa 9.9. CaxPhos < 55.  Renal: SCr 1.77, CrCl  ~66ml/min. UOP ok at 0.5 ml/kg/hr  Pulm: Intubated on 2/28. FiO2 at 60%  Cards: BP soft and HR 90s. Levo and Neo gtt off  Hepatobil: LFTs wnl. TBilil 0.9. Triglycerides ok at 87.  Neuro: confused and agitated on 2/28 so intubated. Fentanyl @ 300, Versed @ 3 and Nimbex gtts @ 3 (TOF 2/4), GCS 3, CPOT 0, RASS -4  ID: [id number] abx for serratia bacteremia/sepsis. Afebrile, WBC down to 24.6  Best Practices: Hep [employer]  TPN Access: Attempting to put in new central line today  TPN start date: 2/20 >> 2/24; resumed 2/24 >> 2.27; resumed 3/7 >>  Current Nutrition:  No diet orders Vital AF 1.2 stopped 3/6  Nutritional Goals: (Per RD on 3/6) KCal: 1670 Protein: 115-130 g Fluid: > 2 L per day  Plan:  Restart Clinimix E 5/15 at 40 ml/hr Restart IVFE at 10 ml/hr (Had already been on TPN in the ICU last week) This provides 48 g of protein and 1162 kcals meeting 40% of protein and 70% of kcal needs Continue MVI and trace elements in TPN Continue sensitive SSI and adjust as needed Monitor TPN labs, fluid status, BMet tomorrow  RD recommends restarting TFs by replacing Cortrak tube in post-pyloric region to help with intolerance. Consider retrial of post-pyloric TFs  Enzo Bi, PharmD, [facility] Clinical Pharmacist Pager [phone] 08/21/15 8:53 AM

## 2015-07-23 NOTE — Progress Notes (Signed)
Nursing note: 0600 - bedside cardiac monitor with noted change from NSR to Junctional with minimal ST changes.  Notified E-Link with EKG and cardiac enzymes obtained.  Called spouse to notify of changes.

## 2015-07-23 NOTE — Consult Note (Signed)
Consult Note  Patient name: Lee Klein MRN: [id number] DOB: Sep 27, 1950 Sex: male  Consulting Physician:  Dr. Jamison Neighbor  Reason for Consult:  Chief Complaint  Patient presents with  . Abdominal Pain    HISTORY OF PRESENT ILLNESS: The patient was admitted for acute on chronic nausea and vomiting. He has Crohn's disease. Also with significant comorbidities. 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 He was treated as drug-induced pancreatitis. GI and ID have been following. He had lactic acidosis. Had fluid resuscitation. Had resp distress on 2/21. Chest x-ray with bilateral infiltrates, new from admission. He went into respiratory distress.  The patient is currently intubated and sedated.  He had a triple-lumen catheter placed yesterday.  Central venous pressure was found to be elevated.  Tracings earlier today revealed an arterial waveform.  A CT scan shows that the catheter is in the arterial system.  I have been asked to assist with removal.  Past Medical History  Diagnosis Date  . Crohn's disease (HCC)   . GERD (gastroesophageal reflux disease)   . Anxiety   . 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 PICC (peripherally inserted central catheter) in place     right arm  . Osteomyelitis of hand, left, acute (HCC)   . Hearing loss   . Pancreatitis 10/2014  . History of kidney stones   . History of pancreatitis 11/06/2014  . Mycobacterial infection     left hand/wrist  . Thin skin   . Septic arthritis of wrist, left (HCC)   . DVT (deep venous thrombosis) (HCC) 04/2015    left leg  . Pneumonia 04/2015  . Acute pancreatitis 07/08/2015  . AKI (acute kidney injury) (HCC) 07/08/2015  . PICC (peripherally inserted central catheter) in place     Past Surgical History  Procedure Laterality Date  . Colonoscopy    . Appendectomy  ~ 2009  . Ileostomy closure N/A 10/23/2013    Procedure: ILEOSTOMY TAKEDOWN,  REPAIR OF PARASTOMAL HERNIA.;  Surgeon: Cherylynn Ridges, MD;  Location: [facility];  Service: General;  Laterality: N/A;  . Wrist arthroscopy with debridement Left 05/24/2014    Procedure: LEFT ARTHROSCOPY WRIST CULTURE/BIOPSY DEBRIDEMENT;  Surgeon: Cindee Salt, MD;  Location: [facility];  Service: Orthopedics;  Laterality: Left;  . Esophagogastroduodenoscopy N/A 06/15/2014    Procedure: ESOPHAGOGASTRODUODENOSCOPY (EGD);  Surgeon: Malissa Hippo, MD;  Location: [facility];  Service: Endoscopy;  Laterality: N/A;  . Cholecystectomy  ~ 2008  . Parastomal hernia repair  10/2013  . Incision and drainage abscess Left 06/22/2014    Procedure: INCISION, DEBRIDEMENT AND IRRIGATION LEFT HAND/WRIST;  Surgeon: Cindee Salt, MD;  Location: [facility];  Service: Orthopedics;  Laterality: Left;  . Incision and drainage of wound Left 07/27/2014    Procedure: IRRIGATION AND DEBRIDEMENT WOUND;  Surgeon: Cindee Salt, MD;  Location: [facility];  Service: Orthopedics;  Laterality: Left;  . Incision and drainage abscess Left 08/09/2014    Procedure: INCISION AND DRAINAGE LEFT WRIST;  Surgeon: Cindee Salt, MD;  Location: [facility];  Service: Orthopedics;  Laterality: Left;  . Incision and drainage abscess Left 09/05/2014    Procedure: INCISION AND DRAINAGE LEFT HAND;  Surgeon: Cindee Salt, MD;  Location: [facility];  Service: Orthopedics;  Laterality: Left;  . I&d extremity Left 10/02/2014    Procedure: IRRIGATION AND DEBRIDEMENT  LEFT HAND;  Surgeon: Cindee Salt, MD;  Location: [facility];  Service: Orthopedics;  Laterality: Left;  . I&d extremity Left 10/12/2014    Procedure: MINOR IRRIGATION AND DEBRIDEMENT LEFT HAND;  Surgeon: Cindee Salt, MD;  Location: [facility];  Service: Orthopedics;  Laterality: Left;  . Incision and drainage abscess Left 11/12/2014    Procedure: INCISION AND DRAINAGE ABSCESS LEFT HAND;  Surgeon: Cindee Salt,  MD;  Location: [facility];  Service: Orthopedics;  Laterality: Left;  . Incision and drainage Left 11/27/2014    Procedure: INCISION AND DRAINAGE LEFT HAND;  Surgeon: Cindee Salt, MD;  Location: [facility];  Service: Orthopedics;  Laterality: Left;  . Tonsillectomy  1963  . Colon surgery  ~ 2010    for a colonic stricture at the anastomosis from a colostomy reversal/notes 10/03/2013  . Parastomal hernia repair  11/02/2013  . Orif distal radius fracture Right 06/29/2006  . Ileostomy    . Ileoscopy  07/02/2010  . I&d extremity Left 01/01/2015    Procedure: IRRIGATION AND DEBRIDEMENT LEFT HAND/WRIST;  Surgeon: Cindee Salt, MD;  Location: [facility];  Service: Orthopedics;  Laterality: Left;    Social History   Social History  . Marital Status: Married    Spouse Name: N/A  . Number of Children: N/A  . Years of Education: N/A   Occupational History  . Not on file.   Social History Main Topics  . Smoking status: Former Smoker -- 30 years    Quit date: 05/31/2006  . Smokeless tobacco: Never Used  . Alcohol Use: 0.0 oz/week    0 Standard drinks or equivalent per week     Comment: rare  . Drug Use: No  . Sexual Activity:    Partners: Female    Copy: None   Other Topics Concern  . Not on file   Social History Narrative    History reviewed. No pertinent family history.  Allergies as of 2015/07/06 - Review Complete 07/06/2015  Allergen Reaction Noted  . Amikacin Other (See Comments) 11/08/2014  . Cefoxitin Nausea Only 11/12/2014  . Fish allergy Nausea And Vomiting 10/11/2013  . Iodine Nausea Only 11/06/2014  . Ivp dye [iodinated diagnostic agents] Nausea Only 06/01/2011  . Rifampin Nausea And Vomiting and Other (See Comments) 06/19/2014  . Shellfish allergy Swelling 10/23/2013  . Vancomycin Other (See Comments) 01/15/2015  . Demerol Rash 06/01/2011    No current facility-administered medications on file prior to  encounter.   Current Outpatient Prescriptions on File Prior to Encounter  Medication Sig Dispense Refill  . ALPRAZolam (XANAX) 0.5 MG tablet TAKE 1 TABLET BY MOUTH three times  A DAY AS NEEDED FOR ANXIETY (Patient taking differently: Take 0.5 mg by mouth 2 (two) times daily. Morning and Afternoon) 90 tablet 2  . Ascorbic Acid (VITAMIN C) 1000 MG tablet Take 1,000 mg by mouth daily.    Marland Kitchen azithromycin (ZITHROMAX) 600 MG tablet TAKE 1 TABLET (600 MG TOTAL) BY MOUTH DAILY. 30 tablet 10  . Calcium Carbonate-Vitamin D (CALCIUM 600 + D PO) Take 1 tablet by mouth daily at 12 noon.     . CLOFAZIMINE PO Take 2 tablets by mouth at bedtime.     . feeding supplement, ENSURE ENLIVE, (ENSURE ENLIVE) LIQD Take 237 mLs by mouth 3 (three) times daily. (Patient taking differently: Take 237 mLs by mouth 2 (two) times daily between meals. ) 237 mL 12  . folic acid (FOLVITE) 1  MG tablet Take 1 mg by mouth daily.    Marland Kitchen HYDROcodone-acetaminophen (NORCO) 10-325 MG tablet Take 1 tablet by mouth every 6 (six) hours as needed. (Patient taking differently: Take 1 tablet by mouth every 6 (six) hours as needed for moderate pain or severe pain. ) 120 tablet 0  . loperamide (IMODIUM A-D) 2 MG tablet Take 8 mg by mouth 2 (two) times daily.     . Multiple Vitamins-Minerals (MULTIVITAMIN PO) Take 1 tablet by mouth daily.     Marland Kitchen nystatin (MYCOSTATIN/NYSTOP) 100000 UNIT/GM POWD Apply 1 g topically 3 (three) times a week. PRN  1  . pantoprazole (PROTONIX) 40 MG tablet Take 1 tablet (40 mg total) by mouth daily before breakfast. 90 tablet 3  . predniSONE (DELTASONE) 5 MG tablet Take 2 tablets (10 mg total) by mouth daily with breakfast. 180 tablet 0  . promethazine (PHENERGAN) 25 MG tablet Take 1 tablet (25 mg total) by mouth as needed for nausea or vomiting. 60 tablet 2  . tigecycline 50 mg in sodium chloride 0.9 % 100 mL Inject 50 mg into the vein every 12 (twelve) hours. 1 vial 0  . XARELTO 20 MG TABS tablet Take 20 mg by mouth daily  with supper.   5  . Zinc 50 MG CAPS Take 1 capsule by mouth daily.     Marland Kitchen azithromycin (ZITHROMAX) 600 MG tablet Take 1 tablet (600 mg total) by mouth daily. (Patient not taking: Reported on 07/13/2015) 7 tablet 0  . levalbuterol (XOPENEX) 1.25 MG/0.5ML nebulizer solution Take 1.25 mg by nebulization every 4 (four) hours as needed for wheezing or shortness of breath. (Patient not taking: Reported on 06/23/2015) 1 each 12  . mirtazapine (REMERON SOL-TAB) 15 MG disintegrating tablet Take 1 tablet (15 mg total) by mouth at bedtime. (Patient not taking: Reported on 07/14/2015) 30 tablet 11     REVIEW OF SYSTEMS: Unable to obtain, the patient is intubated and sedated  PHYSICAL EXAMINATION: General: The patient appears their stated age.  Vital signs are BP 111/51 mmHg  Pulse 63  Temp(Src) 98.2 F (36.8 C) (Oral)  Resp 30  Ht 5\' 10"  (1.778 m)  Wt 156 lb 1.4 oz (70.8 kg)  BMI 22.40 kg/m2  SpO2 100% Pulmonary: Intubated HEENT:  No gross abnormalities Abdomen: Soft and non-tender .  Ostomy in place Musculoskeletal: There are no major deformities.   Neurologic: No focal weakness or paresthesias are detected, Skin: Edema and bruising throughout . Psychiatric: The patient has normal affect. Cardiovascular: There is a regular rate and rhythm without significant murmur appreciated.  Diagnostic Studies: I have reviewed his CT scan which shows a catheter within the arterial circulation    Assessment:  Catheter and right carotid artery Plan: I discussed with the wife was at the bedside that this catheter needs to be removed.  After reviewing the CT scan independently and with radiology, because the scan is without contrast, it is difficult to determine the exact location where the catheter enters the artery.  I think the best course of action is to take this out in the operating room through a supraclavicular incision, rather than put the catheter out at the bedside.  I discussed the risks and  benefits with the wife and she agrees.  We will proceed urgently to the operating room.     Jorge Ny, M.D. [facility] Office: [phone] Pager:  [phone]

## 2015-07-23 NOTE — Progress Notes (Signed)
eLink Physician-Brief Progress Note Patient Name: Lee Klein DOB: Jul 15, 1950 MRN: [id number]   Date of Service  07/25/2015  HPI/Events of Note  Notified by nurse that patient's right IJ CVL has an arterial waveform. Line was placed 3/6. Reviewed Portable CXR. Shows tip of CVL and PICC line are 2cm from each other.   eICU Interventions  1. Stat Chest CT & Neck CT w/o contrast 2. Vascular Surgery Consult pending result 3. Do not use IJ Central Line for now     Intervention Category Major Interventions: Other:  Lawanda Cousins 07/17/2015, 6:30 PM

## 2015-07-23 NOTE — Progress Notes (Signed)
 fentanyl pulled out of pyxis under this patient's name by mistake for another patient. Other patient had same order and fentanyl was scanned under that patient and given correctly.

## 2015-07-23 NOTE — Progress Notes (Signed)
Into room with IV team nurse. Pulsatile blood flow noted in R IJ. CVP label changed to ABP and blood pressure noted on monitor 95/55. Dr. Jamison Neighbor (e-md) notified of questionable placement of this line. Advised to "leave it as is" until PA/NP or MD can come to bedside. Patient stable at this time will continue to closely monitor.  Transducer in place for ABP.

## 2015-07-23 NOTE — Anesthesia Preprocedure Evaluation (Signed)
Anesthesia Evaluation  Patient identified by MRN, date of birth, ID band Patient unresponsive  General Assessment Comment:Pt intubated and sedated.Preop documentation limited or incomplete due to emergent nature of procedure.  Airway Mallampati: Intubated       Dental   Pulmonary pneumonia, former smoker,     + decreased breath sounds      Cardiovascular + Peripheral Vascular Disease   Rhythm:regular Rate:Normal     Neuro/Psych    GI/Hepatic GERD  ,H/o pancreatitis   Endo/Other    Renal/GU ARFRenal disease     Musculoskeletal  (+) Arthritis ,   Abdominal   Peds  Hematology  (+) Blood dyscrasia, anemia ,   Anesthesia Other Findings   Reproductive/Obstetrics                             Anesthesia Physical Anesthesia Plan  ASA: IV  Anesthesia Plan: General   Post-op Pain Management:    Induction: Intravenous  Airway Management Planned: Oral ETT  Additional Equipment:   Intra-op Plan:   Post-operative Plan: Post-operative intubation/ventilation  Informed Consent: I have reviewed the patients History and Physical, chart, labs and discussed the procedure including the risks, benefits and alternatives for the proposed anesthesia with the patient or authorized representative who has indicated his/her understanding and acceptance.     Plan Discussed with: CRNA, Anesthesiologist and Surgeon  Anesthesia Plan Comments:         Anesthesia Quick Evaluation

## 2015-07-24 ENCOUNTER — Inpatient Hospital Stay (HOSPITAL_COMMUNITY): Payer: Managed Care, Other (non HMO)

## 2015-07-24 ENCOUNTER — Encounter (HOSPITAL_COMMUNITY): Payer: Self-pay | Admitting: Surgery

## 2015-07-24 ENCOUNTER — Telehealth (INDEPENDENT_AMBULATORY_CARE_PROVIDER_SITE_OTHER): Payer: Self-pay | Admitting: Internal Medicine

## 2015-07-24 LAB — PREPARE PLATELET PHERESIS: Unit division: 0

## 2015-07-24 LAB — TYPE AND SCREEN
ABO/RH(D): A POS
Antibody Screen: NEGATIVE
UNIT DIVISION: 0
UNIT DIVISION: 0

## 2015-07-24 LAB — CBC
HCT: 29 % — ABNORMAL LOW (ref 39.0–52.0)
HEMOGLOBIN: 9.4 g/dL — AB (ref 13.0–17.0)
MCH: 30.8 pg (ref 26.0–34.0)
MCHC: 32.4 g/dL (ref 30.0–36.0)
MCV: 95.1 fL (ref 78.0–100.0)
PLATELETS: 106 10*3/uL — AB (ref 150–400)
RBC: 3.05 MIL/uL — ABNORMAL LOW (ref 4.22–5.81)
RDW: 16.7 % — AB (ref 11.5–15.5)
WBC: 20.4 10*3/uL — ABNORMAL HIGH (ref 4.0–10.5)

## 2015-07-24 LAB — BASIC METABOLIC PANEL
ANION GAP: 6 (ref 5–15)
BUN: 56 mg/dL — AB (ref 6–20)
CALCIUM: 8 mg/dL — AB (ref 8.9–10.3)
CO2: 27 mmol/L (ref 22–32)
CREATININE: 1.53 mg/dL — AB (ref 0.61–1.24)
Chloride: 113 mmol/L — ABNORMAL HIGH (ref 101–111)
GFR calc Af Amer: 55 mL/min — ABNORMAL LOW (ref >60)
GFR, EST NON AFRICAN AMERICAN: 47 mL/min — AB (ref >60)
GLUCOSE: 100 mg/dL — AB (ref 65–99)
Potassium: 3.7 mmol/L (ref 3.5–5.1)
Sodium: 146 mmol/L — ABNORMAL HIGH (ref 135–145)

## 2015-07-24 LAB — PREPARE FRESH FROZEN PLASMA
UNIT DIVISION: 0
Unit division: 0

## 2015-07-24 LAB — POCT I-STAT 3, ART BLOOD GAS (G3+)
Acid-Base Excess: 1 mmol/L (ref 0.0–2.0)
Bicarbonate: 27.3 mEq/L — ABNORMAL HIGH (ref 20.0–24.0)
O2 SAT: 93 %
PCO2 ART: 48.7 mmHg — AB (ref 35.0–45.0)
Patient temperature: 97.6
TCO2: 29 mmol/L (ref 0–100)
pH, Arterial: 7.355 (ref 7.350–7.450)
pO2, Arterial: 70 mmHg — ABNORMAL LOW (ref 80.0–100.0)

## 2015-07-24 LAB — GLUCOSE, CAPILLARY
GLUCOSE-CAPILLARY: 50 mg/dL — AB (ref 65–99)
GLUCOSE-CAPILLARY: 110 mg/dL — AB (ref 65–99)
Glucose-Capillary: 97 mg/dL (ref 65–99)
Glucose-Capillary: 126 mg/dL — ABNORMAL HIGH (ref 65–99)

## 2015-07-24 LAB — MAGNESIUM: MAGNESIUM: 2.1 mg/dL (ref 1.7–2.4)

## 2015-07-24 LAB — PHOSPHORUS: PHOSPHORUS: 4.9 mg/dL — AB (ref 2.5–4.6)

## 2015-07-24 MED ORDER — DEXTROSE 50 % IV SOLN
INTRAVENOUS | Status: AC
  Administered 2015-07-24: 50 mL
  Filled 2015-07-24: qty 50

## 2015-07-24 MED ORDER — HEPARIN SODIUM (PORCINE) 5000 UNIT/ML IJ SOLN
5000.0000 [IU] | Freq: Three times a day (TID) | INTRAMUSCULAR | Status: DC
  Administered 2015-07-24 – 2015-07-30 (×16): 5000 [IU] via SUBCUTANEOUS
  Filled 2015-07-24 (×19): qty 1

## 2015-07-24 MED ORDER — DEXTROSE 5 % IV SOLN
10.0000 mg/kg | Freq: Three times a day (TID) | INTRAVENOUS | Status: AC
  Administered 2015-07-24 – 2015-07-28 (×14): 730 mg via INTRAVENOUS
  Filled 2015-07-24 (×15): qty 14.6

## 2015-07-24 MED ORDER — LABETALOL HCL 5 MG/ML IV SOLN
10.0000 mg | Freq: Once | INTRAVENOUS | Status: AC
  Administered 2015-07-24: 10 mg via INTRAVENOUS

## 2015-07-24 MED ORDER — TORSEMIDE 20 MG PO TABS
20.0000 mg | ORAL_TABLET | Freq: Three times a day (TID) | ORAL | Status: AC
  Administered 2015-07-24 (×2): 20 mg
  Filled 2015-07-24 (×2): qty 1

## 2015-07-24 MED ORDER — ALBUMIN HUMAN 25 % IV SOLN
25.0000 g | Freq: Four times a day (QID) | INTRAVENOUS | Status: AC
  Administered 2015-07-24 – 2015-07-25 (×4): 25 g via INTRAVENOUS
  Filled 2015-07-24: qty 100
  Filled 2015-07-24: qty 50
  Filled 2015-07-24: qty 100
  Filled 2015-07-24: qty 50
  Filled 2015-07-24: qty 100

## 2015-07-24 MED ORDER — HEMOSTATIC AGENTS (NO CHARGE) OPTIME
TOPICAL | Status: DC | PRN
  Administered 2015-07-24: 1 via TOPICAL

## 2015-07-24 MED ORDER — TORSEMIDE 20 MG/2ML IV SOLN
20.0000 mg | Freq: Three times a day (TID) | INTRAVENOUS | Status: DC
  Filled 2015-07-24: qty 2

## 2015-07-24 MED ORDER — LABETALOL HCL 5 MG/ML IV SOLN
INTRAVENOUS | Status: AC
  Filled 2015-07-24: qty 4

## 2015-07-24 MED ORDER — ROCURONIUM BROMIDE 50 MG/5ML IV SOLN
INTRAVENOUS | Status: AC
  Filled 2015-07-24: qty 1

## 2015-07-24 MED ORDER — FUROSEMIDE 10 MG/ML IJ SOLN
10.0000 mg/h | INTRAVENOUS | Status: DC
  Administered 2015-07-24: 10 mg/h via INTRAVENOUS
  Filled 2015-07-24 (×2): qty 25

## 2015-07-24 MED ORDER — POTASSIUM CHLORIDE 10 MEQ/50ML IV SOLN
10.0000 meq | INTRAVENOUS | Status: AC
  Administered 2015-07-24 (×2): 10 meq via INTRAVENOUS
  Filled 2015-07-24 (×2): qty 50

## 2015-07-24 MED ORDER — SODIUM CHLORIDE 0.9 % IV SOLN
INTRAVENOUS | Status: DC | PRN
  Administered 2015-07-24: 500 mL

## 2015-07-24 MED ORDER — DEXTROSE 50 % IV SOLN
INTRAVENOUS | Status: AC
  Filled 2015-07-24: qty 50

## 2015-07-24 MED ORDER — DEXTROSE 50 % IV SOLN
25.0000 mL | Freq: Once | INTRAVENOUS | Status: AC
  Administered 2015-07-24: 25 mL via INTRAVENOUS
  Filled 2015-07-24: qty 50

## 2015-07-24 MED ORDER — METOLAZONE 10 MG PO TABS
10.0000 mg | ORAL_TABLET | Freq: Every day | ORAL | Status: AC
  Administered 2015-07-24: 10 mg via ORAL
  Filled 2015-07-24: qty 1

## 2015-07-24 MED ORDER — 0.9 % SODIUM CHLORIDE (POUR BTL) OPTIME
TOPICAL | Status: DC | PRN
  Administered 2015-07-24: 1000 mL

## 2015-07-24 NOTE — Progress Notes (Signed)
eLink Physician-Brief Progress Note Patient Name: DENSON NICCOLI DOB: Jan 13, 1951 MRN: [id number]   Date of Service  07/24/2015  HPI/Events of Note  Sudden increase in PIP and plateau pressure  eICU Interventions  STAT CXR to R/O PTX     Intervention Category Major Interventions: Respiratory failure - evaluation and management  Billy Fischer 07/24/2015, 11:13 PM

## 2015-07-24 NOTE — Progress Notes (Signed)
RT advanced ETT 2 cm per MD order. Found at 22 and advanced to 24 post bronchoscopy.

## 2015-07-24 NOTE — Telephone Encounter (Signed)
Dr.Rehman is aware of [name] Condition. I will forward this to him as a update.

## 2015-07-24 NOTE — Progress Notes (Signed)
PULMONARY / CRITICAL CARE MEDICINE   Name: RONDO KOEL MRN: [id number] DOB: 1959/10/30    ADMISSION DATE:  06/19/2015 CONSULTATION DATE: 07/09/15  REFERRING MD: Dr. Oswaldo Done / Internal Medicine  CHIEF COMPLAINT: Abdominal pain and vomiting   BRIEF PATIENT DESCRIPTION : The patient was admitted for acute on chronic nausea and vomiting. He has Crohn's disease. Also with significant comorbidities. 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 He was treated as drug-induced pancreatitis. GI and ID have been following. He had lactic acidosis. Had fluid resuscitation. Had resp distress on 2/21. Chest x-ray with bilateral infiltrates, new from admission. He went into respiratory distress. O2 sats 60% on non breather mask. Transferred to ICU. ABG prior to transfer was 7.26, PCO2 33, PO2 48 on 100% nonrebreather mask. He received 80 mg IV Lasix. He was intubated on 2/28 for prorgressive respiratory failure.  SUBJECTIVE:  Worsening FiO2 and PEEP overnight.  VITAL SIGNS: BP 131/61 mmHg  Pulse 68  Temp(Src) 97.6 F (36.4 C) (Oral)  Resp 34  Ht 5\' 10"  (1.778 m)  Wt 72.5 kg (159 lb 13.3 oz)  BMI 22.93 kg/m2  SpO2 91%   PHYSICAL EXAMINATION:  General: sedated on vent. Paralyzed.  HENT: NCAT ETT in place PULM: vent supported breaths. B crackles CV: RRR, no mgr GI: ostomy in place Derm: some arm edema, bruising throughout extensor surfaces, scrotum and inner thigh with erythematous rash Neuro: sedated on vent for my exam More edema today.    HEMODYNAMICS: CVP:  [1 mmHg-6 mmHg] 2 mmHg  VENTILATOR SETTINGS: Vent Mode:  [-] PRVC FiO2 (%):  [50 %-100 %] 50 % Set Rate:  [30 bmp-34 bmp] 34 bmp Vt Set:  [400 mL-450 mL] 400 mL PEEP:  [8 cmH20-10 cmH20] 8 cmH20 Plateau Pressure:  [38 cmH20-50 cmH20] 38 cmH20  INTAKE / OUTPUT: I/O last 3 completed shifts: In: 5181.4 [I.V.:3430.4; Blood:1321; NG/GT:180; IV Piggyback:250] Out: 2010 [Urine:1710;  Stool:200; Blood:100]   LABS:  BMET  Recent Labs Lab 07/22/15 0452 08/13/2015 0306 07/24/15 0427  NA 144 144 146*  K 4.3 4.3 3.7  CL 107 108 113*  CO2 28 25 27   BUN 53* 63* 56*  CREATININE 1.77* 1.95* 1.53*  GLUCOSE 122* 121* 100*   Electrolytes  Recent Labs Lab 07/22/15 0452 08/09/2015 0306 07/24/15 0427  CALCIUM 8.3* 8.3* 8.0*  MG  --  2.0 2.1  PHOS  --  4.3 4.9*    CBC  Recent Labs Lab 07/19/2015 0306 07/30/2015 2045 07/24/15 0427  WBC 24.6* 19.8* 20.4*  HGB 7.7* 7.6* 9.4*  HCT 24.2* 23.5* 29.0*  PLT 137* 97* 106*   Coag's  Recent Labs Lab 07/22/2015 2045  APTT 30  INR 1.56*    Sepsis Markers No results for input(s): LATICACIDVEN, PROCALCITON, O2SATVEN in the last 168 hours.  ABG  Recent Labs Lab 08/02/2015 0330 08/07/2015 0859 07/24/15 0419  PHART 7.427 7.455* 7.355  PCO2ART 40.0 38.5 48.7*  PO2ART 61.0* 58.0* 70.0*    Liver Enzymes  Recent Labs Lab 08/15/2015 0306  AST 30  ALT 37  ALKPHOS 146*  BILITOT 0.9  ALBUMIN 1.9*    Cardiac Enzymes  Recent Labs Lab 08/09/2015 0640 07/19/2015 1234 07/25/2015 1905  TROPONINI <0.03 0.03 <0.03    Glucose  Recent Labs Lab 08/15/2015 0834 07/24/2015 1206 07/29/2015 1516 08/07/2015 2034 07/24/15 0147 07/24/15 0353  GLUCAP 100* 109* 103* 90 50* 110*    Imaging Ct Soft Tissue Neck Wo Contrast  07/24/2015  CLINICAL  DATA:  Evaluate catheter placement. Hypoxia. Possible arterial placement. EXAM: CT NECK WITHOUT CONTRAST TECHNIQUE: Multidetector CT imaging of the neck was performed following the standard protocol without intravenous contrast. COMPARISON:  CT chest reported separately. FINDINGS: Study done without contrast. A RIGHT supraclavicular catheter has been inserted and enters in the RIGHT subclavian artery after passing through the vein. The catheter terminates in the ascending aorta. There is a RIGHT supraclavicular neck hematoma, approximately 3 x 4 cm cross-section. Active arterial extravasation is  not excluded on this noncontrast exam. The patient is intubated. Orogastric tube is in esophagus. Airway is midline. There is carotid atherosclerosis. There is cervical spondylosis. No worrisome osseous lesions. Negative intracranial compartment. No sinus or mastoid disease of significance. IMPRESSION: RIGHT subclavian vein catheter has entered the RIGHT subclavian artery and lies with its tip in the ascending aorta. RIGHT supraclavicular neck hematoma, 3 x 4 cm cross-section approximately. Active extravasation not excluded on this noncontrast exam. These findings were discussed with the referring provider by Dr. Eppie Gibson at 7:57 p.m. Electronically Signed   By: Elsie Stain M.D.   On: 08/01/2015 20:01   Ct Chest Wo Contrast  07/29/2015  CLINICAL DATA:  Acute respiratory failure with hypoxia. Evaluate internal jugular line position which is pulsatile. EXAM: CT CHEST WITHOUT CONTRAST TECHNIQUE: Multidetector CT imaging of the chest was performed following the standard protocol without IV contrast. COMPARISON:  None. FINDINGS: Mediastinum/Lymph Nodes: No masses or pathologically enlarged lymph nodes identified on this un-enhanced exam. Endotracheal tube, nasogastric tube, and feeding tube are all seen in appropriate position. A right subclavian central venous catheter is seen with tip in the mid to distal SVC. No evidence of pneumothorax. A right internal jugular central venous catheter is seen in the intra-arterial location, with tip near the anterior wall of the ascending thoracic aorta. No evidence of mediastinal hematoma, although mild pneumomediastinum is demonstrated. No evidence cardiomegaly or pericardial effusion. Lungs/Pleura: Small bilateral pleural effusions are seen. Diffuse bilateral airspace disease is consistent with edema or ARDS. Upper abdomen: No acute findings. Musculoskeletal: No chest wall mass or suspicious bone lesions identified. Diffuse chest wall edema also noted. IMPRESSION: Right internal  jugular central venous catheter is intra-arterial in location, with tip near the anterior wall of the ascending thoracic aorta. Mild pneumomediastinum noted, however no evidence of mediastinal hematoma, pneumothorax, or pericardial effusion. Small bilateral pleural effusions and diffuse bilateral airspace disease, which may be due to diffuse edema or ARDS. Diffuse chest wall edema also noted. Critical Value/emergent results were called by telephone at the time of interpretation on 07/24/2015 at 7:52 pm to Dr. Lawanda Cousins , who verbally acknowledged these results. Electronically Signed   By: Myles Rosenthal M.D.   On: 07/21/2015 19:58   Dg Chest Port 1 View  07/24/2015  CLINICAL DATA:  Shortness of breath. EXAM: PORTABLE CHEST 1 VIEW COMPARISON:  CT 07/22/2015.  Chest x-ray 07/20/2015 and 07/22/2015. FINDINGS: Interim removal of right IJ catheter which was noted to be an intra-arterial location. Interim placement of left IJ catheter, its tip is at the confluence of the brachiocephalic vein and superior vena cava. Interim placement of feeding tube, its tip is below the left hemidiaphragm. Endotracheal tube, NG tube in stable position. Mild pneumomediastinum best demonstrated by prior CT. Stable mild mediastinal fullness. Heart size is stable. Stable diffuse bilateral pulmonary infiltrates. Stable small pleural effusions. No pneumothorax. IMPRESSION: 1. Interim removal of right IJ line which was demonstrated to be in an intra-arterial location. Interim placement of a left  IJ line, its tip is at the brachiocephalic SVC confluence. Interim placement of a feeding tube, its tip is below the left hemidiaphragm. Endotracheal tube, NG tube, right PICC line in stable position. 2. Persistent diffuse bilateral pulmonary infiltrates/edema. Tiny stable bilateral pleural effusions. 3. Mild pneumomediastinum best demonstrated by prior CT. No pneumothorax. 4. Stable cardiomegaly. Electronically Signed   By: Maisie Fus  Register   On:  07/24/2015 07:21   Dg Abd Portable 1v  08-14-2015  CLINICAL DATA:  Evaluate feeding tube placement. EXAM: PORTABLE ABDOMEN - 1 VIEW COMPARISON:  07/19/2015 FINDINGS: Enteric tube passes through the stomach. Tip projects in the expected location of the 2nd to 3rd portion of the duodenum. There is adequate length of the tube to allow the tip to progress with peristalsis to the ligament of Treitz. There is also a new nasogastric tube with its tip in the mid stomach. IMPRESSION: Enteric feeding tube metallic tip projects at the junction of the second and third portions of the duodenum. Electronically Signed   By: Amie Portland M.D.   On: 2015/08/14 16:56   SIGNIFICANT EVENTS:  07/13/2015 admitted for nausea vomiting, possible drug-induced pancreatitis 07/09/15 went into resp distress: O2 sats 60% on NRBM; transferred to ICU  07/12/15 marked fluid overload unable to wean off bipap 07/16/15 Intubated 3/7 attempted to get off paralytics with significant desaturation and asynchrony  STUDIES : [name] Abd (2/17) >> Heterogeneous echogenicity of pancreas which could due pancreatic inflammation; small ascites; fatty liver 2D echo (05/2014) >> EF 50%, CHFpEF CT Abd/pelvis - negative for pancreatic necrosis or abscess or complication related to Crohn's disease. New moderate bilateral pleural effusions. New moderate volume of abdominal ascites. 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  Echo 2/26 - normal EF, G1DD  IMAGING :   CULTURES: Blood culture (2/21) >> GNR, Serratia BCx (2/22) >> NGTD  ANTIBIOTICS :  Clofazimine 2/17 >> 2/21 Azithromycin 2/17 >> 2/21 Tigecycline 2/17 -2/20 Vanc 2/21>> 2/22 Meropenem 2/21>>2/24>>>3/7>>> Ceftazidine 2/24>> 2/25 Ceftriaxone 2/25 >> 3/7  LINES:  R PICC --removed PICC 3 lumens 3/6>>> LIJ 2/21>> 2/27 L PICC 2/27 > 3/6 Right ileostomy  ETT 2/28>>> R IJ TLC 3/6>>>  DISCUSSION: 65 y/o male with Crohn's disease, long standing protein calorie  malnutrition, ostomy who was admitted on 2/21 with acute pancreatitis and has struggled with acute respiratory failure with hypoxemia likely due to ARDS and pulmonary edema. Intubated 2/28.  ASSESSMENT/PLAN:  PULMONARY A:  Acute hypoxemic respiratory failure secondary: Pulmonary edema, ARDS ?aspiration. P: Continue full vent support.  Paralyze x24 more hours. Xopenex nebulized q8h Will begin active diureses today, if improves and renal function is acceptable then will trach Friday, if renal function fails then will recommend proceeding with comfort.  CARDIOVASCULAR A:  Diastolic dysfunction based on 1610/9604 echo, EF nml. Not known to have CAD  P:  D/C lopressor. Levophed and neo for BP support. Lasix as below.  RENAL A:  Acute kidney injury Hypernatremia -- better Pulm edema P:  D/C free water. Lasix 10 mg/hr IV drip x24 hours. Zaroxolyn 10 mg PO x1. Albumin 25% 25 gm IV q6 hours x4 doses. Monitor BMET and UOP Replace electrolytes as needed  GASTROINTESTINAL A:  Crohn's disease requiring chronic steroids Drug-induced pancreatitis (Tigecycline) - Lipase improved Chronic nausea, vomiting History of antibiotic related diarrhea Severe malnutrition P:  Trickle feeds today, if not tolerated then TPN in AM. D/C TPN. Continue stress ulcer prophylaxis  HEMATOLOGIC A:  Thrombocytopenia, chronic, stable H/O left lower extremity DVT in October  2016. By history, unprovoked. He was on Xarelto outpatient. 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 Abx for 1 yr. Serratia bacteremia -PICC on admission likely source Fungal rash groin P:  Per ID:  D/Ced  rocephin. Continue merem. Diflucan 1 dose complete Clotrimazole cream groin  ENDOCRINE A: Chronic adrenal insufficiency  P:  Continue IV steroids Monitor sugars SSI  NEUROLOGIC A: Anxiety, chronic Sedation needs for vent synchrony P:  RASS goal: -3 Maintain paralyzed another 24 hours.  FAMILY  - Updates: Updated wife and daughter at bedside at length.  The patient is critically ill with multiple organ systems failure and requires high complexity decision making for assessment and support, frequent evaluation and titration of therapies, application of advanced monitoring technologies and extensive interpretation of multiple databases.   Critical Care Time devoted to patient care services described in this note is  35  Minutes. This time reflects time of care of this signee Dr Koren Bound. This critical care time does not reflect procedure time, or teaching time or supervisory time of PA/NP/Med student/Med Resident etc but could involve care discussion time.  Alyson Reedy, M.D. [facility] Pulmonary/Critical Care Medicine. Pager: [phone]. After hours pager: [phone].  07/24/2015, 10:16 AM

## 2015-07-24 NOTE — Transfer of Care (Signed)
Immediate Anesthesia Transfer of Care Note  Patient: Lee Klein  Procedure(s) Performed: Procedure(s): Removal of Subclavian Artery Catheter; Primary Closure of Subclavian Artery; Primary Closure of Internal Jugular Vein.  (Right)  Patient Location: ICU  Anesthesia Type:General  Level of Consciousness: sedated and Patient remains intubated per anesthesia plan  Airway & Oxygen Therapy: Patient remains intubated per anesthesia plan and Patient placed on Ventilator (see vital sign flow sheet for setting)  Post-op Assessment: Report given to RN and Post -op Vital signs reviewed and stable  Post vital signs: Reviewed and stable  Last Vitals:  Filed Vitals:   07/28/2015 1830 08/03/2015 2035  BP: 111/51   Pulse: 63   Temp:  36.8 C  Resp: 30     Complications: No apparent anesthesia complications

## 2015-07-24 NOTE — Progress Notes (Addendum)
Nutrition Follow-up  DOCUMENTATION CODES:   Non-severe (moderate) malnutrition in context of chronic illness  INTERVENTION:    Resume TF via Cortrak tube (tip in 2nd-3rd portion of the duodenum) with Vital AF 1.2 at 25 ml/h increase by 10 ml every 4 hours to goal rate of 65 ml/h to provide 1872 kcals, 117 gm protein, 1265 ml free water daily.   If TF not tolerated, resume TPN.  NUTRITION DIAGNOSIS:   Malnutrition related to chronic illness as evidenced by mild depletion of muscle mass, mild depletion of body fat.  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 In the ED he was noted to have a significant leukocytosis, lactic acidosis, acute kidney injury, and elevated lipase consistent with pancreatitis and was therefore admitted to the internal medicine teaching service for further evaluation and care. He denies a history of alcohol use or hypertriglyceridemia. He had a cholecystectomy in the past and has had no problems since. He has had pancreatitis in the past.  IJ tube was removed and replaced yesterday evening, therefore, TPN has been off. Cortrak tube was placed yesterday with tip in the 2nd-3rd portion of the duodenum. OGT  still in place. Spoke with CCM physician, okay to start TF via Cortrak tube. Will hold off on TPN for today.  Patient remains intubated on ventilator support MV: 13.4 L/min Temp (24hrs), Avg:97.9 F (36.6 C), Min:97.6 F (36.4 C), Max:98.2 F (36.8 C)  Propofol: none   Diet Order:  TPN (CLINIMIX-E) Adult  Skin:  Reviewed, no issues  Last BM:  3/8 (ileostomy)  Height:   Ht Readings from Last 1 Encounters:  05/28/15  (1.778 m)    Weight:   Wt Readings from Last 1 Encounters:  07/24/15 159 lb 13.3 oz (72.5 kg)    Ideal Body Weight:  75.5 kg  BMI:  Body mass index is 22.93 kg/(m^2).  Estimated Nutritional Needs:   Kcal:  1823  Protein:  115-130 gm  Fluid:  >/= 2 L  EDUCATION NEEDS:   Education needs addressed  Joaquin Courts, RD, LDN, CNSC Pager [phone] After Hours Pager [phone]

## 2015-07-24 NOTE — Progress Notes (Signed)
PARENTERAL NUTRITION CONSULT NOTE - FOLLOW UP  Pharmacy Consult:  TPN Indication: Intolerance to EN  Allergies  Allergen Reactions  . Amikacin Other (See Comments)    CAUSED DEAFNESS  . Cefoxitin Nausea Only    ABD. PAIN  . Fish Allergy Nausea And Vomiting  . Iodine Nausea Only    JOINT PAIN  . Ivp Dye [Iodinated Diagnostic Agents] Nausea Only    JOINT PAIN  . Rifampin Nausea And Vomiting and Other (See Comments)    CHILLS  . Shellfish Allergy Swelling  . Vancomycin Other (See Comments)    Red Man Syndrome - was told that it was given to fast.  . Demerol Rash    Patient Measurements: Height:  (177.8 cm) Weight: 159 lb 13.3 oz (72.5 kg) IBW/kg (Calculated) : 73 Weight from 04/2015 = 69 kg  Vital Signs: Temp: 97.6 F (36.4 C) (03/08 0355) Temp Source: Oral (03/08 0355) BP: 179/85 mmHg (03/08 0630) Pulse Rate: 95 (03/08 0630) Intake/Output from previous day: 03/07 0701 - 03/08 0700 In: 3624.7 [I.V.:2143.7; Blood:1321; NG/GT:60; IV Piggyback:100] Out: 1455 [Urine:1355; Blood:100]  Labs:  Recent Labs  07/24/2015 0306 07/22/2015 2045 07/24/15 0427  WBC 24.6* 19.8* 20.4*  HGB 7.7* 7.6* 9.4*  HCT 24.2* 23.5* 29.0*  PLT 137* 97* 106*  APTT  --  30  --   INR  --  1.56*  --      Recent Labs  07/22/15 0452 07/21/2015 0306 07/24/15 0427  NA 144 144 146*  K 4.3 4.3 3.7  CL 107 108 113*  CO2 GLUCOSE 122* 121* 100*  BUN 53* 63* 56*  CREATININE 1.77* 1.95* 1.53*  CALCIUM 8.3* 8.3* 8.0*  MG  --  2.0 2.1  PHOS  --  4.3 4.9*  PROT  --  4.0*  --   ALBUMIN  --  1.9*  --   AST  --  30  --   ALT  --  37  --   ALKPHOS  --  146*  --   BILITOT  --  0.9  --   PREALBUMIN  --  8.0*  --   TRIG  --  87  --    Estimated Creatinine Clearance: 51.3 mL/min (by C-G formula based on Cr of 1.53).    Recent Labs  07/17/2015 2034 07/24/15 0147 07/24/15 0353  GLUCAP 90 50* 110*     Insulin Requirements in the past 24 hours:  1 unit of SSI (TPN was not  started)  Assessment: 62 YOM with a history of Crohn's disease s/p ileostomy presented on July 10, 2015 with intermittent epigastric abdominal pain, cramping, and vomiting for 2 months PTA. Per documentation, patient is a poor candidate for surgical J-tube placement and while awaiting for antibiotic changes and resolution of GI symptoms for adequate PO intake, pharmacy consulted to manage TPN for nutritional support. 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 TPN was given from 2/20 to 2/24 and then held several times before being stopped on 2/27 due to worsening fluid status. TFs tried over past few days and patient is not tolerating. Pharmacy re-consulted to manage TPN on 07/24/15; however, TPN was not resumed due to improper location of central line  GI: hx Crohn's / GERD / pancreatitis. NG tube placed with intubation and had been on Vital AF 1.2, now not tolerating well. Prealbumin low  at 8.0. Stool O/P , PPI per tube Endo: AI on hydrocortisone.  No hx DM - hypoglycemic given no nutrition (on stress steroid) Lytes: elevated Na/CL 146/113, Phos 4.9 (Ca x Phos = 47, goal < 55) Renal: SCr improved to 1.53, BUN mildly elevated at 56 - good UOP Pulm: intubated on 2/28 - FiO2 at 60%, Xopenex nebs Cards: EF 60-65% - BP elevated, HR mostly controlled, CVP 3 AC/Heme: Xarelto PTA for hx LLE DVT dx 02/2015, completed treatment course. HIT negative Hepatobil: LFTs wnl. TBilil 0.9. Triglycerides ok at 87. Neuro: confused and agitated on 2/28 so intubated. Fentanyl/Versed/Nimbex - GCS 3, CPOT 0, RASS -4, TOG ID: Abx D#[id number] for serratia bacteremia/sepsis, PTA abx for M.absessus osteo of hand held - afebrile, WBC up 20.4 Best Practices: heparin SQ, MC, lacrilube TPN Access: TPN start date: 2/20 >> 2/24; resumed 2/26 >> 2/27  Current Nutrition:  None  Nutritional Goals: 1600-1700 kCal and 115-130  gm protein per day   Plan:  - Hold TPN today per CCM as patient to initiate post-pyloric tube feeding - F/U TF tolerance in AM to d/c TPN consult   Aileena Iglesia D. Laney Potash, PharmD, BCPS Pager:  [phone] 07/24/2015, 11:07 AM

## 2015-07-24 NOTE — Telephone Encounter (Signed)
Skeet Simmer, a Home Health Nurse with [facility] left a message in regards to Mr. Perezperez. She spoke to his wife last night and she wanted Dr. Karilyn Cota to be updated concerning his condition. He's been in [facility] since February 17th and he's not doing well. He's been on a ventilator since last week. He initially went to the hospital due to having stomach issues and Providers think it's due to his chronic long-term use of antibiotics. If you have questions for Bonita Quin, please feel free to contact her.  Linda's ph# [phone] Thank you.

## 2015-07-24 NOTE — Anesthesia Postprocedure Evaluation (Signed)
Anesthesia Post Note  Patient: Lee Klein  Procedure(s) Performed: Procedure(s) (LRB): Removal of Subclavian Artery Catheter; Primary Closure of Subclavian Artery; Primary Closure of Internal Jugular Vein.  (Right)  Patient location during evaluation: ICU Anesthesia Type: General Level of consciousness: sedated and patient remains intubated per anesthesia plan Pain management: pain level controlled Respiratory status: patient remains intubated per anesthesia plan Cardiovascular status: stable Anesthetic complications: no    Last Vitals:  Filed Vitals:   07/24/15 0430 07/24/15 0500  BP: 140/63   Pulse: 70 71  Temp:    Resp: 34 34    Last Pain:  Filed Vitals:   07/24/15 0500  PainSc: Asleep                 Jef Futch S

## 2015-07-24 NOTE — Procedures (Signed)
Bronchoscopy Procedure Note Lee Klein [id number] 1950/11/29  Procedure: Bronchoscopy Indications: Obtain specimens for culture and/or other diagnostic studies  Procedure Details Consent: Risks of procedure as well as the alternatives and risks of each were explained to the (patient/caregiver).  Consent for procedure obtained. Time Out: Verified patient identification, verified procedure, site/side was marked, verified correct patient position, special equipment/implants available, medications/allergies/relevent history reviewed, required imaging and test results available.  Performed  In preparation for procedure, patient was given 100% FiO2 and bronchoscope lubricated. Sedation: Versed, fentanyl and nimbex.  Airway entered and the following bronchi were examined: RUL, RML, RLL, LUL, LLL and Bronchi.   BAL from right and left obtained separately. Small plaques noted R>L. Bronchoscope removed.    Evaluation Hemodynamic Status: BP stable throughout; O2 sats: stable throughout Patient's Current Condition: stable Specimens:  Sent purulent fluid Complications: No apparent complications Patient did tolerate procedure well.   Lee Klein 07/24/2015

## 2015-07-24 NOTE — Progress Notes (Addendum)
Pharmacy Antibiotic Note  Lee Klein is a 65 y.o. male admitted on 07/08/2015 now with serratia bacteremia.  Currently on meropenem dosing due to concern for inducible b-lactamase production by serratia. Despite multiple days of targeted therapy, WBC remains elevated at 20.4, Afebrile. CrCl ~ 50 mL/min. Pharmacy consulted to start acyclovir for r/o HSV bronchitis  Plan: Start acyclovir 10 mg/kg (730 mg) Q 8 hours  Continue meropenem 1 gm IV Q 12 hours  Monitor CBC, renal fx, cultures and clinical progress  Height: 5\' 10"  (177.8 cm) Weight: 159 lb 13.3 oz (72.5 kg) IBW/kg (Calculated) : 73  Temp (24hrs), Avg:97.8 F (36.6 C), Min:97.5 F (36.4 C), Max:98.2 F (36.8 C)   Recent Labs Lab 07/20/15 0506 07/21/15 1054 07/22/15 0452 08-13-2015 0306 Aug 13, 2015 2045 07/24/15 0427  WBC 27.1* 30.4* 32.6* 24.6* 19.8* 20.4*  CREATININE 1.76* 1.51* 1.77* 1.95*  --  1.53*    Estimated Creatinine Clearance: 51.3 mL/min (by C-G formula based on Cr of 1.53).    Allergies  Allergen Reactions  . Amikacin Other (See Comments)    CAUSED DEAFNESS  . Cefoxitin Nausea Only    ABD. PAIN  . Fish Allergy Nausea And Vomiting  . Iodine Nausea Only    JOINT PAIN  . Ivp Dye [Iodinated Diagnostic Agents] Nausea Only    JOINT PAIN  . Rifampin Nausea And Vomiting and Other (See Comments)    CHILLS  . Shellfish Allergy Swelling  . Vancomycin Other (See Comments)    Red Man Syndrome - was told that it was given to fast.  . Demerol Rash    Antimicrobials this admission: Clofazimine and azith stopped 2/21 Vanc 2/21 >> 2/22 Merrem 2/21 >> 2/24; 3/7>>  Fortaz 2/24 >>2/25 CTX 2/25 >> 3/7 Fluc x1 3/3 3/8 Acyclovir>>  Dose adjustments this admission: None   Microbiology results: 2/21 UA - clean 2/21 BCx: serratia (R to ancef) 2/22 repeat BCx: negative 2/21 MRSA PCR neg 3/6 BAL>>NGTD   Thank you for allowing pharmacy to be a part of this patient's care.  Vinnie Level, PharmD.,  BCPS Clinical Pharmacist Pager [phone]

## 2015-07-24 NOTE — Progress Notes (Signed)
  Progress Note    07/24/2015 8:36 AM 1 Day Post-Op  Subjective:  Intubated/sedated  afebrile  Filed Vitals:   07/24/15 0730 07/24/15 0800  BP: 150/76 154/73  Pulse: 74 78  Temp:    Resp: 0 34    Physical Exam: Incisions:  Clean and dry with extensive ecchymosis; no hematoma Extremities:  monophasic right radial doppler signal and a faint right ulnar signal; bilateral arms cool to touch   CBC    Component Value Date/Time   WBC 20.4* 07/24/2015 0427   RBC 3.05* 07/24/2015 0427   RBC 1.97* 07/12/2014 1515   HGB 9.4* 07/24/2015 0427   HCT 29.0* 07/24/2015 0427   PLT 106* 07/24/2015 0427   MCV 95.1 07/24/2015 0427   MCH 30.8 07/24/2015 0427   MCHC 32.4 07/24/2015 0427   RDW 16.7* 07/24/2015 0427   LYMPHSABS 1.1 08/13/2015 2045   MONOABS 0.7 07/18/2015 2045   EOSABS 0.0 08/11/2015 2045   BASOSABS 0.0 07/22/2015 2045    BMET    Component Value Date/Time   NA 146* 07/24/2015 0427   K 3.7 07/24/2015 0427   CL 113* 07/24/2015 0427   CO2 27 07/24/2015 0427   GLUCOSE 100* 07/24/2015 0427   BUN 56* 07/24/2015 0427   CREATININE 1.53* 07/24/2015 0427   CALCIUM 8.0* 07/24/2015 0427   GFRNONAA 47* 07/24/2015 0427   GFRAA 55* 07/24/2015 0427    INR    Component Value Date/Time   INR 1.56* 07/21/2015 2045     Intake/Output Summary (Last 24 hours) at 07/24/15 0836 Last data filed at 07/24/15 0800  Gross per 24 hour  Intake 3708.36 ml  Output   1380 ml  Net 2328.36 ml     Assessment:  65 y.o. male is s/p:  #1: Removal of right subclavian artery catheter #2. Primary closure of right subclavian arteriotomy #3. Primary closure of right internal jugular veinotomy  1 Day Post-Op  Plan: -pt incision without hematoma but does have extensive ecchymosis Right arm is cold, however this is bilateral-there is a monophasic right radial doppler signal and a faint right ulnar signal -hgb improved from last pm after transfusion    Doreatha Massed, PA-C [facility] [phone] 07/24/2015 8:36 AM    I agree with the above.  Incision ok.  Doppler radial and ulnar signal on left  Wells Kierre Hintz

## 2015-07-24 NOTE — Progress Notes (Signed)
eLink Physician-Brief Progress Note Patient Name: Lee Klein DOB: [birth year] MRN: [id number]   Date of Service  07/24/2015  HPI/Events of Note  Called d/t Pplat = 42 now. ABG on TV 450 and PRVC 30 = 7.45/38/58  eICU Interventions  Will decrease TV to 400 mL and increase PRVC rate to 34. Recheck ABG at 5 AM.     Intervention Category Major Interventions: Respiratory failure - evaluation and management  [name] 07/24/2015, 3:24 AM

## 2015-07-24 NOTE — Progress Notes (Signed)
   07/24/15 1551  Clinical Encounter Type  Visited With Family;Health care provider  Visit Type Initial;Social support;Critical Care  Referral From Patient   Chaplain responded to a request to assist the patient's wife find temporary housing while her husband is in the hospital. Patient's wife is particularly concerned about travel this weekend with potential snow. Chaplain answered a few questions and referred her to our department secretary who could answer her questions more fully and help those accommodations get in place. Chaplain support available as needed.   Alda Ponder, Chaplain 07/24/2015 3:53 PM

## 2015-07-24 NOTE — Procedures (Signed)
Bedside Bronchoscopy Procedure Note Lee Klein [id number] [birth year]  Procedure: Bronchoscopy Indications: Diagnostic evaluation of the airways and Obtain specimens for culture and/or other diagnostic studies  Procedure Details: ET Tube Size: ET Tube secured at lip (cm): Bite block in place: Yes In preparation for procedure, Patient hyper-oxygenated with 100 % FiO2 Airway entered and the following bronchi were examined: RUL, RML, RLL, LUL, LLL and Bronchi.   Bronchoscope removed.  , Patient placed back on 100% FiO2 at conclusion of procedure.    Evaluation BP 126/62 mmHg  Pulse 71  Temp(Src) 97.5 F (36.4 C) (Oral)  Resp 0  Ht  (1.778 m)  Wt 159 lb 13.3 oz (72.5 kg)  BMI 22.93 kg/m2  SpO2 91% Breath Sounds:Clear O2 sats: stable throughout Patient's Current Condition: stable Specimens:  Sent serosanguinous fluid Complications: No apparent complications Patient did tolerate procedure well.   [name] 07/24/2015, 2:35 PM

## 2015-07-24 NOTE — Op Note (Signed)
    Patient name: KALVYN SECKINGER MRN: [id number] DOB: [birth year] Sex: male  07/24/2015 Pre-operative Diagnosis: Catheter in right carotid artery Post-operative diagnosis:  Catheter in right subclavian artery Surgeon:  Durene Cal Assistants:  S. Rhyne Procedure:   #1:  Removal of right subclavian artery catheter   #2.  Primary closure of right subclavian arteriotomy   #3.  Primary closure of right internal jugular veinotomy Anesthesia:  General Blood Loss:  See anesthesia record Specimens:  None  Findings:  Catheter went through and through the right internal jugular vein and was in the subclavian artery just beyond the vertebral artery origin  Indications:  The patient is critically ill in the ICU.  I triple lumen catheter was inadvertently placed into the artery, confirmed with arterial tracings.  A CT scan was done which after I reviewed, I felt it was best to remove the catheter in the OR for fear of not being able to compress the artery and get hemostasis once the catheter was removed.  This was discussed with his wife and she agreed  Procedure:  The patient was identified in the holding area and taken to [facility] OR ROOM 16  The patient was then placed supine on the table. general anesthesia was administered.  The patient was prepped and draped in the usual sterile fashion.  A time out was called and antibiotics were administered.  A transverse supra-clavicular incision was made beginning just lateral to the sternal notch for about 4 cm in length.  The platysma muscle was divided.  I serparated the 2 heads of the SCM muscle and dissected out the internal jugular vein.  I then mobilized the common carotid artery.  I then traced the catheter from the skin insertion site into the IJ vein.  It was a through and through puncture.  I then proceeded with dissection of the catheter deep into the incision.  I never saw the vagus nerve due to the hematoma.  I was eventually able to find the subclavian  artery and dissected this out, identifying the vertebral artery.  The catheter entered the subclavian artery on teh anterior surperior surface, just beyond the vertebral artery origin.  Once visualization was adequate, I placed a pledegeted 5-0 prolene suture in a U-shape configuration around the catheter insertion site.  The catheter was removed and the suture was secured, closing the arteriotomy site.  It was hemostatic.  I then removed the catheter from the IJ vein and closed both veinotomy sites with 6-0 prolene.  The wound was irrigated.  Once hemostasis was satisfactory, the SCM muscle was re-approximated with 2-0 vicryl.  The platysma was closed with 3-0 vicryl, and the skin was closed with 4-0 vicryl, followed by dermabond.  A skin tear above the incision was closed with dermabond.  There were no immediate complications.   Disposition:  To PACU in stable condition.   Juleen China, M.D. [facility] [place] Office: [phone] Pager:  [phone]

## 2015-07-25 ENCOUNTER — Inpatient Hospital Stay (HOSPITAL_COMMUNITY): Payer: Managed Care, Other (non HMO)

## 2015-07-25 LAB — BLOOD GAS, ARTERIAL
ACID-BASE EXCESS: 6.4 mmol/L — AB (ref 0.0–2.0)
Bicarbonate: 32.2 mEq/L — ABNORMAL HIGH (ref 20.0–24.0)
DRAWN BY: 454021
FIO2: 0.5
MECHVT: 400 mL
O2 SAT: 88.7 %
PATIENT TEMPERATURE: 98.6
PEEP: 8 cmH2O
PH ART: 7.323 — AB (ref 7.350–7.450)
RATE: 34 resp/min
TCO2: 34.2 mmol/L (ref 0–100)
pCO2 arterial: 63.9 mmHg (ref 35.0–45.0)
pO2, Arterial: 61.9 mmHg — ABNORMAL LOW (ref 80.0–100.0)

## 2015-07-25 LAB — GLUCOSE, CAPILLARY
GLUCOSE-CAPILLARY: 158 mg/dL — AB (ref 65–99)
GLUCOSE-CAPILLARY: 155 mg/dL — AB (ref 65–99)
Glucose-Capillary: 119 mg/dL — ABNORMAL HIGH (ref 65–99)
Glucose-Capillary: 119 mg/dL — ABNORMAL HIGH (ref 65–99)
Glucose-Capillary: 154 mg/dL — ABNORMAL HIGH (ref 65–99)
Glucose-Capillary: 160 mg/dL — ABNORMAL HIGH (ref 65–99)

## 2015-07-25 LAB — COMPREHENSIVE METABOLIC PANEL
ALBUMIN: 3 g/dL — AB (ref 3.5–5.0)
ALT: 36 U/L (ref 17–63)
AST: 32 U/L (ref 15–41)
Alkaline Phosphatase: 111 U/L (ref 38–126)
Anion gap: 11 (ref 5–15)
BILIRUBIN TOTAL: 0.9 mg/dL (ref 0.3–1.2)
BUN: 54 mg/dL — AB (ref 6–20)
CHLORIDE: 108 mmol/L (ref 101–111)
CO2: 31 mmol/L (ref 22–32)
CREATININE: 1.46 mg/dL — AB (ref 0.61–1.24)
Calcium: 8.3 mg/dL — ABNORMAL LOW (ref 8.9–10.3)
GFR calc Af Amer: 58 mL/min — ABNORMAL LOW (ref >60)
GFR, EST NON AFRICAN AMERICAN: 50 mL/min — AB (ref >60)
GLUCOSE: 113 mg/dL — AB (ref 65–99)
POTASSIUM: 3.1 mmol/L — AB (ref 3.5–5.1)
Sodium: 150 mmol/L — ABNORMAL HIGH (ref 135–145)
Total Protein: 4.7 g/dL — ABNORMAL LOW (ref 6.5–8.1)

## 2015-07-25 LAB — CULTURE, BAL-QUANTITATIVE W GRAM STAIN
Colony Count: NO GROWTH
Culture: NO GROWTH

## 2015-07-25 LAB — CBC
HCT: 27.3 % — ABNORMAL LOW (ref 39.0–52.0)
HEMOGLOBIN: 9 g/dL — AB (ref 13.0–17.0)
MCH: 32.3 pg (ref 26.0–34.0)
MCHC: 33 g/dL (ref 30.0–36.0)
MCV: 97.8 fL (ref 78.0–100.0)
Platelets: 82 10*3/uL — ABNORMAL LOW (ref 150–400)
RBC: 2.79 MIL/uL — AB (ref 4.22–5.81)
RDW: 18.2 % — ABNORMAL HIGH (ref 11.5–15.5)
WBC: 13.7 10*3/uL — ABNORMAL HIGH (ref 4.0–10.5)

## 2015-07-25 LAB — CULTURE, BAL-QUANTITATIVE

## 2015-07-25 LAB — PHOSPHORUS: Phosphorus: 5.3 mg/dL — ABNORMAL HIGH (ref 2.5–4.6)

## 2015-07-25 LAB — MAGNESIUM: MAGNESIUM: 2.1 mg/dL (ref 1.7–2.4)

## 2015-07-25 MED ORDER — VECURONIUM BROMIDE 10 MG IV SOLR
10.0000 mg | Freq: Once | INTRAVENOUS | Status: DC
Start: 2015-07-25 — End: 2015-07-27
  Filled 2015-07-25: qty 10

## 2015-07-25 MED ORDER — ALBUMIN HUMAN 25 % IV SOLN
25.0000 g | Freq: Four times a day (QID) | INTRAVENOUS | Status: AC
  Administered 2015-07-25 – 2015-07-26 (×4): 25 g via INTRAVENOUS
  Filled 2015-07-25 (×3): qty 100
  Filled 2015-07-25: qty 50

## 2015-07-25 MED ORDER — FREE WATER
250.0000 mL | Freq: Four times a day (QID) | Status: DC
  Administered 2015-07-25 (×3): 250 mL

## 2015-07-25 MED ORDER — DEXTROSE 5 % IV SOLN
10.0000 mg/h | INTRAVENOUS | Status: AC
  Administered 2015-07-25: 10 mg/h via INTRAVENOUS
  Filled 2015-07-25: qty 25

## 2015-07-25 MED ORDER — POTASSIUM CHLORIDE 20 MEQ/15ML (10%) PO SOLN
40.0000 meq | Freq: Three times a day (TID) | ORAL | Status: AC
  Administered 2015-07-25 (×2): 40 meq
  Filled 2015-07-25 (×2): qty 30

## 2015-07-25 MED ORDER — PROPOFOL 500 MG/50ML IV EMUL
5.0000 ug/kg/min | Freq: Once | INTRAVENOUS | Status: DC
  Filled 2015-07-25: qty 50

## 2015-07-25 MED ORDER — FENTANYL CITRATE (PF) 100 MCG/2ML IJ SOLN: 200.0000 ug | Freq: Once | INTRAMUSCULAR | Status: DC

## 2015-07-25 MED ORDER — ETOMIDATE 2 MG/ML IV SOLN
40.0000 mg | Freq: Once | INTRAVENOUS | Status: DC
  Filled 2015-07-25 (×2): qty 20

## 2015-07-25 MED ORDER — POTASSIUM CHLORIDE 20 MEQ/15ML (10%) PO SOLN
30.0000 meq | ORAL | Status: AC
  Administered 2015-07-25 (×2): 30 meq
  Filled 2015-07-25 (×2): qty 30

## 2015-07-25 MED ORDER — MIDAZOLAM HCL 2 MG/2ML IJ SOLN: 4.0000 mg | Freq: Once | INTRAMUSCULAR | Status: DC

## 2015-07-25 MED ORDER — METOLAZONE 10 MG PO TABS
10.0000 mg | ORAL_TABLET | Freq: Every day | ORAL | Status: AC
  Administered 2015-07-25: 10 mg via ORAL
  Filled 2015-07-25: qty 1

## 2015-07-25 NOTE — Progress Notes (Signed)
PULMONARY / CRITICAL CARE MEDICINE   Name: Lee Klein MRN: [id number] DOB: 06-28-1950    ADMISSION DATE:  2015-07-12 CONSULTATION DATE: 07/09/15  REFERRING MD: Dr. Oswaldo Done / Internal Medicine  CHIEF COMPLAINT: Abdominal pain and vomiting   BRIEF PATIENT DESCRIPTION : The patient was admitted for acute on chronic nausea and vomiting. He has Crohn's disease. Also with significant comorbidities. 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 He was treated as drug-induced pancreatitis. GI and ID have been following. He had lactic acidosis. Had fluid resuscitation. Had resp distress on 2/21. Chest x-ray with bilateral infiltrates, new from admission. He went into respiratory distress. O2 sats 60% on non breather mask. Transferred to ICU. ABG prior to transfer was 7.26, PCO2 33, PO2 48 on 100% nonrebreather mask. He received 80 mg IV Lasix. He was intubated on 2/28 for prorgressive respiratory failure.  SUBJECTIVE:  Worsening FiO2 and PEEP overnight.  VITAL SIGNS: BP 142/68 mmHg  Pulse 110  Temp(Src) 98.4 F (36.9 C) (Oral)  Resp 34  Ht 5\' 10"  (1.778 m)  Wt 70.3 kg (154 lb 15.7 oz)  BMI 22.24 kg/m2  SpO2 94%   PHYSICAL EXAMINATION:  General: sedated on vent. Paralyzed.  HENT: NCAT ETT in place PULM: vent supported breaths. B crackles CV: RRR, no mgr GI: ostomy in place Derm: some arm edema, bruising throughout extensor surfaces, scrotum and inner thigh with erythematous rash Neuro: sedated on vent for my exam More edema today.    HEMODYNAMICS: CVP:  [1 mmHg-11 mmHg] 11 mmHg  VENTILATOR SETTINGS: Vent Mode:  [-] PRVC FiO2 (%):  [45 %-60 %] 45 % Set Rate:  [34 bmp-35 bmp] 34 bmp Vt Set:  [400 mL] 400 mL PEEP:  [8 cmH20] 8 cmH20 Plateau Pressure:  [41 cmH20-43 cmH20] 43 cmH20  INTAKE / OUTPUT: I/O last 3 completed shifts: In: 6211.1 [I.V.:3004.7; Blood:1321; NG/GT:791.7; IV Piggyback:1093.8] Out: 5880 [Urine:5480; Stool:300;  Blood:100]   LABS:  BMET  Recent Labs Lab 07/29/2015 0306 07/24/15 0427 07/25/15 0415  NA 144 146* 150*  K 4.3 3.7 3.1*  CL 108 113* 108  CO2 25 27 31   BUN 63* 56* 54*  CREATININE 1.95* 1.53* 1.46*  GLUCOSE 121* 100* 113*   Electrolytes  Recent Labs Lab 07/30/2015 0306 07/24/15 0427 07/25/15 0415  CALCIUM 8.3* 8.0* 8.3*  MG 2.0 2.1 2.1  PHOS 4.3 4.9* 5.3*    CBC  Recent Labs Lab 07/19/2015 2045 07/24/15 0427 07/25/15 0415  WBC 19.8* 20.4* 13.7*  HGB 7.6* 9.4* 9.0*  HCT 23.5* 29.0* 27.3*  PLT 97* 106* 82*   Coag's  Recent Labs Lab 07/26/2015 2045  APTT 30  INR 1.56*    Sepsis Markers No results for input(s): LATICACIDVEN, PROCALCITON, O2SATVEN in the last 168 hours.  ABG  Recent Labs Lab 08/15/2015 0859 07/24/15 0419 07/25/15 0405  PHART 7.455* 7.355 7.323*  PCO2ART 38.5 48.7* 63.9*  PO2ART 58.0* 70.0* 61.9*    Liver Enzymes  Recent Labs Lab 07/18/2015 0306 07/25/15 0415  AST 30 32  ALT 37 36  ALKPHOS 146* 111  BILITOT 0.9 0.9  ALBUMIN 1.9* 3.0*    Cardiac Enzymes  Recent Labs Lab 08/13/2015 0640 08/08/2015 1234 07/24/2015 1905  TROPONINI <0.03 0.03 <0.03    Glucose  Recent Labs Lab 07/24/15 0353 07/24/15 1525 07/24/15 2001 07/24/15 2335 07/25/15 0412 07/25/15 0732  GLUCAP 110* 97 126* 160* 119* 158*    Imaging Dg Chest Port 1 View  07/25/2015  CLINICAL  DATA:  Intubation. EXAM: PORTABLE CHEST 1 VIEW COMPARISON:  07/24/2015.  07/24/2015.  CT 07/22/2015. FINDINGS: Endotracheal tube, left IJ line, NG tube, feeding tube, right PICC line in stable position. Previously identified pneumomediastinum best identified by CT of 08/15/2015 . Stable cardiomegaly. Diffuse bilateral airspace disease, unchanged. Stable mild prominence of the superior mediastinum. Stable biapical pleural thickening. No pneumothorax. IMPRESSION: 1. Lines and tubes in stable position. 2. Diffuse unchanged bilateral airspace disease. Electronically Signed   By: Maisie Fus   Register   On: 07/25/2015 07:09   Dg Chest Port 1 View  07/24/2015  CLINICAL DATA:  Respiratory failure EXAM: PORTABLE CHEST 1 VIEW COMPARISON:  07/24/2015 FINDINGS: Endotracheal tube in good position. Left-sided central venous catheter in the left innominate vein. Right PICC tip in the SVC. NG tube in the stomach. Feeding tube also in the stomach. No pneumothorax Diffuse bilateral airspace disease is unchanged. IMPRESSION: Support lines remain in stable position. Diffuse bilateral airspace disease unchanged. Electronically Signed   By: Marlan Palau M.D.   On: 07/24/2015 23:36   SIGNIFICANT EVENTS:  07/18/15 admitted for nausea vomiting, possible drug-induced pancreatitis 07/09/15 went into resp distress: O2 sats 60% on NRBM; transferred to ICU  07/12/15 marked fluid overload unable to wean off bipap 07/16/15 Intubated 3/7 attempted to get off paralytics with significant desaturation and asynchrony  STUDIES : [name] Abd (2/17) >> Heterogeneous echogenicity of pancreas which could due pancreatic inflammation; small ascites; fatty liver 2D echo (05/2014) >> EF 50%, CHFpEF CT Abd/pelvis - negative for pancreatic necrosis or abscess or complication related to Crohn's disease. New moderate bilateral pleural effusions. New moderate volume of abdominal ascites. 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  Echo 2/26 - normal EF, G1DD  IMAGING :   CULTURES: Blood culture (2/21) >> GNR, Serratia BCx (2/22) >> NGTD  ANTIBIOTICS :  Clofazimine 2/17 >> 2/21 Azithromycin 2/17 >> 2/21 Tigecycline 2/17 -2/20 Vanc 2/21>> 2/22 Meropenem 2/21>>2/24>>>3/7>>> Ceftazidine 2/24>> 2/25 Ceftriaxone 2/25 >> 3/7  LINES:  R PICC --removed PICC 3 lumens 3/6>>> LIJ 2/21>> 2/27 L PICC 2/27 > 3/6 Right ileostomy  ETT 2/28>>> R IJ TLC 3/6>>>  DISCUSSION: 65 y/o male with Crohn's disease, long standing protein calorie malnutrition, ostomy who was admitted on 2/21 with acute pancreatitis and has  struggled with acute respiratory failure with hypoxemia likely due to ARDS and pulmonary edema. Intubated 2/28.  ASSESSMENT/PLAN:  PULMONARY A:  Acute hypoxemic respiratory failure secondary: Pulmonary edema, ARDS ?aspiration. P: Continue full vent support.  Paralyze x24 more hours. Xopenex nebulized q8h Continue active diureses for one more day.  CARDIOVASCULAR A:  Diastolic dysfunction based on 1610/9604 echo, EF nml. Not known to have CAD  P:  D/C lopressor. Levophed and neo for BP support. Lasix as below.  RENAL A:  Acute kidney injury Hypernatremia -- better Pulm edema P:  D/C free water. Lasix 10 mg/hr IV drip x24 hours. Zaroxolyn 10 mg PO x1. Albumin 25% 25 gm IV q6 hours x4 doses. Monitor BMET and UOP Replace electrolytes as needed  GASTROINTESTINAL A:  Crohn's disease requiring chronic steroids Drug-induced pancreatitis (Tigecycline) - Lipase improved Chronic nausea, vomiting History of antibiotic related diarrhea Severe malnutrition P:  Continue TF. D/C TPN. Continue stress ulcer prophylaxis.  HEMATOLOGIC A:  Thrombocytopenia, chronic, stable H/O left lower extremity DVT in October 2016. By history, unprovoked. He was on Xarelto outpatient. 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 Abx for 1 yr. Serratia bacteremia -PICC on admission likely source Fungal rash groin P:  Per ID:  D/Ced rocephin. Continue merem. Diflucan 1 dose complete Clotrimazole cream groin  ENDOCRINE A: Chronic adrenal insufficiency  P:  Continue stress dose steroids, chronically on steroids. Monitor sugars SSI  NEUROLOGIC A: Anxiety,  chronic Sedation needs for vent synchrony P:  RASS goal: -3 Maintain paralyzed another 24 hours.  FAMILY  - Updates: Updated wife at bedside at length.  Patient is improving some, will place trach in AM if continues to improve, in the meantime continue active diureses.  The patient is critically ill with multiple organ systems failure and requires high complexity decision making for assessment and support, frequent evaluation and titration of therapies, application of advanced monitoring technologies and extensive interpretation of multiple databases.   Critical Care Time devoted to patient care services described in this note is  35  Minutes. This time reflects time of care of this signee Dr Koren Bound. This critical care time does not reflect procedure time, or teaching time or supervisory time of PA/NP/Med student/Med Resident etc but could involve care discussion time.  Alyson Reedy, M.D. [facility] Pulmonary/Critical Care Medicine. Pager: [phone]. After hours pager: [phone].  07/25/2015, 9:53 AM

## 2015-07-25 NOTE — Progress Notes (Signed)
[facility] ADULT ICU REPLACEMENT PROTOCOL FOR AM LAB REPLACEMENT ONLY  The patient does apply for the [facility] Adult ICU Electrolyte Replacment Protocol based on the criteria listed below:   1. Is GFR >/= 40 ml/min? Yes.    Patient's GFR today is 50 2. Is urine output >/= 0.5 ml/kg/hr for the last 6 hours? Yes.   Patient's UOP is 1.90 ml/kg/hr 3. Is BUN < 60 mg/dL? Yes.    Patient's BUN today is 54 4. Abnormal electrolyte  K 3.1 5. Ordered repletion with: per protocol 6. If a panic level lab has been reported, has the CCM MD in charge been notified? Yes.  .   Physician:  Gaylyn Rong McEachran 07/25/2015 6:01 AM

## 2015-07-25 NOTE — Progress Notes (Signed)
UR Completed. Malayshia All, RN, BSN.  [phone] 

## 2015-07-25 NOTE — Progress Notes (Signed)
CRITICAL VALUE ALERT  Critical value received:  PCO2 63.9   Date of notification:  07/25/15  Time of notification:  0415  Critical value read back:Yes.    Nurse who received alert:  Annabell Sabal RN  MD notified (1st page):  PA Rutherford Guys  Time of first page:  0630

## 2015-07-25 NOTE — Progress Notes (Signed)
eLink Physician-Brief Progress Note Patient Name: Lee Klein DOB: 1951/05/02 MRN: [id number]   Date of Service  07/25/2015  HPI/Events of Note  Notified by bedside nurse that patient is now borderline hypotensive on Lasix drip which was started today. Approximately 3 L diuresis per nurse report.Systolic blood pressure 90 mmHg & mean arterial pressure 61.CVP 3.  eICU Interventions  Stopping Lasix infusion at this time.     Intervention Category Intermediate Interventions: Hypotension - evaluation and management  Lawanda Cousins 07/25/2015, 8:06 PM

## 2015-07-25 NOTE — Progress Notes (Signed)
[facility] Progress Note  Subjective  - POD #2  Intubated/sedated  Objective Filed Vitals:   07/25/15 1000 07/25/15 1100  BP: 157/79 135/73  Pulse: 121 118  Temp:    Resp: 34 34    Intake/Output Summary (Last 24 hours) at 07/25/15 1138 Last data filed at 07/25/15 1102  Gross per 24 hour  Intake 3745.94 ml  Output   6275 ml  Net -2529.06 ml    Right upper chest incisions clean and intact. No hematoma. Extensive ecchymosis Right hand is warm and well perfused.   Assessment/Planning: 65 y.o. male is s/p:  #1: Removal of right subclavian artery catheter #2. Primary closure of right subclavian arteriotomy #3. Primary closure of right internal jugular veinotomy   2 Days Post-Op   Incisions clean. No hematoma. Right hand well perfused.   Lee Klein 07/25/2015 11:38 AM --  Laboratory CBC    Component Value Date/Time   WBC 13.7* 07/25/2015 0415   HGB 9.0* 07/25/2015 0415   HCT 27.3* 07/25/2015 0415   PLT 82* 07/25/2015 0415    BMET    Component Value Date/Time   NA 150* 07/25/2015 0415   K 3.1* 07/25/2015 0415   CL 108 07/25/2015 0415   CO2 31 07/25/2015 0415   GLUCOSE 113* 07/25/2015 0415   BUN 54* 07/25/2015 0415   CREATININE 1.46* 07/25/2015 0415   CALCIUM 8.3* 07/25/2015 0415   GFRNONAA 50* 07/25/2015 0415   GFRAA 58* 07/25/2015 0415    COAG Lab Results  Component Value Date   INR 1.56* 08/01/2015   INR 2.10* 07/09/2015   No results found for: PTT  Antibiotics Anti-infectives    Start     Dose/Rate Route Frequency Ordered Stop   07/24/15 1430  acyclovir (ZOVIRAX) 730 mg in dextrose 5 % 150 mL IVPB     10 mg/kg  73 kg (Ideal) 164.6 mL/hr over 60 Minutes Intravenous 3 times per day 07/24/15 1405     07/29/2015 1200  meropenem (MERREM) 1 g in sodium chloride 0.9 % 100 mL IVPB     1 g 200 mL/hr over 30 Minutes Intravenous Every 12 hours 07/22/2015 1053     07/19/15 1300  fluconazole (DIFLUCAN) IVPB 200 mg     200  mg 100 mL/hr over 60 Minutes Intravenous  Once 07/19/15 1248 07/19/15 1443   07/13/15 2330  cefTRIAXone (ROCEPHIN) 2 g in dextrose 5 % 50 mL IVPB  Status:  Discontinued     2 g 100 mL/hr over 30 Minutes Intravenous Every 24 hours 07/13/15 1633 07/19/2015 1053   07/12/15 1400  cefTAZidime (FORTAZ) 2 g in dextrose 5 % 50 mL IVPB  Status:  Discontinued     2 g 100 mL/hr over 30 Minutes Intravenous 3 times per day 07/12/15 1118 07/13/15 1633   07/10/15 0400  vancomycin (VANCOCIN) IVPB 750 mg/150 ml premix  Status:  Discontinued     750 mg 150 mL/hr over 60 Minutes Intravenous Every 12 hours 07/09/15 1545 07/10/15 0904   07/09/15 1515  vancomycin (VANCOCIN) 2,000 mg in sodium chloride 0.9 % 500 mL IVPB     2,000 mg 250 mL/hr over 120 Minutes Intravenous  Once 07/09/15 1500 07/09/15 1846   07/09/15 1515  meropenem (MERREM) 1 g in sodium chloride 0.9 % 100 mL IVPB  Status:  Discontinued     1 g 200 mL/hr over 30 Minutes Intravenous 3 times per day 07/09/15 1500 07/12/15 1001   07/03/2015 2000  tigecycline (TYGACIL)  50 mg in sodium chloride 0.9 % 100 mL IVPB  Status:  Discontinued     50 mg 200 mL/hr over 30 Minutes Intravenous Every 12 hours 06/28/2015 1511 07/08/15 1217   07/09/2015 1600  dextrose 5 % 300 mL with azithromycin (ZITHROMAX) 600 mg infusion  Status:  Discontinued     150 mL/hr  Intravenous Continuous 07/12/2015 1521 06/29/2015 1525   06/23/2015 1600  dextrose 5 % 300 mL with azithromycin (ZITHROMAX) 600 mg infusion  Status:  Discontinued     150 mL/hr  Intravenous Every 24 hours 06/24/2015 1525 07/09/15 1500   07/10/2015 1530  azithromycin (ZITHROMAX) 500 mg in dextrose 5 % 250 mL IVPB  Status:  Discontinued     500 mg 250 mL/hr over 60 Minutes Intravenous Every 24 hours 06/22/2015 1517 07/14/2015 1521       Maris Berger, PA-C [facility] Office: [phone] Pager: [phone] 07/25/2015 11:38 AM

## 2015-07-26 ENCOUNTER — Inpatient Hospital Stay (HOSPITAL_COMMUNITY): Payer: Managed Care, Other (non HMO)

## 2015-07-26 DIAGNOSIS — T17408A Unspecified foreign body in trachea causing other injury, initial encounter: Secondary | ICD-10-CM | POA: Insufficient documentation

## 2015-07-26 DIAGNOSIS — R0689 Other abnormalities of breathing: Secondary | ICD-10-CM | POA: Insufficient documentation

## 2015-07-26 LAB — BASIC METABOLIC PANEL
ANION GAP: 8 (ref 5–15)
BUN: 64 mg/dL — ABNORMAL HIGH (ref 6–20)
CALCIUM: 8.7 mg/dL — AB (ref 8.9–10.3)
CO2: 36 mmol/L — ABNORMAL HIGH (ref 22–32)
Chloride: 110 mmol/L (ref 101–111)
Creatinine, Ser: 1.36 mg/dL — ABNORMAL HIGH (ref 0.61–1.24)
GFR, EST NON AFRICAN AMERICAN: 54 mL/min — AB (ref >60)
Glucose, Bld: 110 mg/dL — ABNORMAL HIGH (ref 65–99)
POTASSIUM: 3 mmol/L — AB (ref 3.5–5.1)
SODIUM: 154 mmol/L — AB (ref 135–145)

## 2015-07-26 LAB — CBC
HEMATOCRIT: 23.6 % — AB (ref 39.0–52.0)
HEMOGLOBIN: 7.5 g/dL — AB (ref 13.0–17.0)
MCH: 32.2 pg (ref 26.0–34.0)
MCHC: 31.8 g/dL (ref 30.0–36.0)
MCV: 101.3 fL — ABNORMAL HIGH (ref 78.0–100.0)
Platelets: 76 10*3/uL — ABNORMAL LOW (ref 150–400)
RBC: 2.33 MIL/uL — ABNORMAL LOW (ref 4.22–5.81)
RDW: 18.2 % — ABNORMAL HIGH (ref 11.5–15.5)
WBC: 11.8 10*3/uL — AB (ref 4.0–10.5)

## 2015-07-26 LAB — BLOOD GAS, ARTERIAL
Acid-Base Excess: 11.2 mmol/L — ABNORMAL HIGH (ref 0.0–2.0)
BICARBONATE: 36.8 meq/L — AB (ref 20.0–24.0)
DRAWN BY: 252031
FIO2: 0.5
MECHVT: 400 mL
O2 SAT: 92.5 %
PATIENT TEMPERATURE: 98.6
PCO2 ART: 65.5 mmHg — AB (ref 35.0–45.0)
PEEP: 8 cmH2O
PH ART: 7.368 (ref 7.350–7.450)
RATE: 34 resp/min
TCO2: 38.8 mmol/L (ref 0–100)
pO2, Arterial: 68.7 mmHg — ABNORMAL LOW (ref 80.0–100.0)

## 2015-07-26 LAB — GLUCOSE, CAPILLARY
GLUCOSE-CAPILLARY: 67 mg/dL (ref 65–99)
GLUCOSE-CAPILLARY: 93 mg/dL (ref 65–99)
GLUCOSE-CAPILLARY: 127 mg/dL — AB (ref 65–99)
Glucose-Capillary: 91 mg/dL (ref 65–99)
Glucose-Capillary: 108 mg/dL — ABNORMAL HIGH (ref 65–99)
Glucose-Capillary: 128 mg/dL — ABNORMAL HIGH (ref 65–99)
Glucose-Capillary: 110 mg/dL — ABNORMAL HIGH (ref 65–99)
Glucose-Capillary: 110 mg/dL — ABNORMAL HIGH (ref 65–99)
Glucose-Capillary: 120 mg/dL — ABNORMAL HIGH (ref 65–99)

## 2015-07-26 LAB — PHOSPHORUS: Phosphorus: 3.6 mg/dL (ref 2.5–4.6)

## 2015-07-26 LAB — MAGNESIUM: Magnesium: 1.9 mg/dL (ref 1.7–2.4)

## 2015-07-26 MED ORDER — SODIUM CHLORIDE 0.9 % IV SOLN
1.0000 mg/h | INTRAVENOUS | Status: DC
  Administered 2015-07-26: 7 mg/h via INTRAVENOUS
  Administered 2015-07-26: 10 mg/h via INTRAVENOUS
  Administered 2015-07-27: 8 mg/h via INTRAVENOUS
  Administered 2015-07-27: 10 mg/h via INTRAVENOUS
  Administered 2015-07-28: 8 mg/h via INTRAVENOUS
  Administered 2015-07-29: 1 mg/h via INTRAVENOUS
  Filled 2015-07-26 (×6): qty 20

## 2015-07-26 MED ORDER — FREE WATER
250.0000 mL | Status: DC
  Administered 2015-07-26 – 2015-07-27 (×4): 250 mL

## 2015-07-26 MED ORDER — METOLAZONE 10 MG PO TABS
10.0000 mg | ORAL_TABLET | Freq: Once | ORAL | Status: AC
  Administered 2015-07-26: 10 mg via ORAL
  Filled 2015-07-26: qty 1

## 2015-07-26 MED ORDER — ETOMIDATE 2 MG/ML IV SOLN
INTRAVENOUS | Status: AC
Start: 2015-07-26 — End: 2015-07-27
  Filled 2015-07-26: qty 10

## 2015-07-26 MED ORDER — ALBUMIN HUMAN 25 % IV SOLN
25.0000 g | Freq: Four times a day (QID) | INTRAVENOUS | Status: AC
  Administered 2015-07-26 – 2015-07-27 (×4): 25 g via INTRAVENOUS
  Filled 2015-07-26: qty 100
  Filled 2015-07-26 (×3): qty 50
  Filled 2015-07-26 (×2): qty 100

## 2015-07-26 MED ORDER — FUROSEMIDE 10 MG/ML IJ SOLN
10.0000 mg/h | INTRAVENOUS | Status: AC
  Administered 2015-07-26 – 2015-07-27 (×2): 10 mg/h via INTRAVENOUS
  Filled 2015-07-26 (×2): qty 25

## 2015-07-26 MED ORDER — HYDROCORTISONE NA SUCCINATE PF 100 MG IJ SOLR
20.0000 mg | Freq: Two times a day (BID) | INTRAMUSCULAR | Status: DC
  Administered 2015-07-26 – 2015-07-29 (×6): 20 mg via INTRAVENOUS
  Filled 2015-07-26 (×6): qty 0.4

## 2015-07-26 MED ORDER — POTASSIUM CHLORIDE 20 MEQ/15ML (10%) PO SOLN
40.0000 meq | Freq: Three times a day (TID) | ORAL | Status: AC
  Administered 2015-07-26 (×2): 40 meq
  Filled 2015-07-26 (×2): qty 30

## 2015-07-26 MED ORDER — MAGNESIUM SULFATE 2 GM/50ML IV SOLN
2.0000 g | Freq: Once | INTRAVENOUS | Status: AC
  Administered 2015-07-26: 2 g via INTRAVENOUS
  Filled 2015-07-26 (×2): qty 50

## 2015-07-26 NOTE — Procedures (Signed)
Percutaneous Tracheostomy Placement  Consent from family.  Patient sedated, paralyzed and position.  Placed on 100% FiO2 and RR matched.  Area cleaned and draped.  Lidocaine/epi injected.  Skin incision done followed by blunt dissection.  Trachea palpated then punctured, catheter passed and visualized bronchoscopically.  Wire placed and visualized.  Catheter removed.  Airway then crushed and dilated.  Size 6 cuffed shiley trach placed and visualized bronchoscopically well above carina.  Good volume returns.  Patient tolerated the procedure well without complications.  Minimal blood loss.  CXR ordered and pending.  Wesam G. Yacoub, M.D. [place] Pulmonary/Critical Care Medicine. Pager: [phone]. After hours pager: [phone]. 

## 2015-07-26 NOTE — Procedures (Signed)
Procedure done by P Babcock ACNP-BC. At first bronch was introduce through ET tube and structures of tracheal rings, carina identified for operator of tracheostomy who was Dr Molli Knock. Light of bronch passed through trachea and skin for indentification of tracheal rings for tracheostomy puncture. While entering the airway a large foreign body with the appearance of aspirated large sample of dried skin (most likely from the oral cavity) was identified. We were concerned that should this advance further it could easily result in obstructed airway. Using the alligator forceps we removed this without difficulty. After this, under bronchoscopy guidance,  ET tube was pulled back sufficiently and very carefully. The ET tube was  pulled back enough to give room for tracheostomy operator and yet at same time to to ensure a secured airway. After this was accomplished, bronchoscope was withdrawn into the ET tube. After this,  Dr Molli Knock then performed tracheostomy under video visual provided by flexible video bronchoscopy. Followng introduction of tracheostomy,  the bronchoscope was removed from ET tube and introduced through tracheostomy. Correct position of tracheostomy was ensured, with enough room between carina and distal tracheostomy and no evidence of bleeding. The bronchoscope was then withdrawn. Respiratory therapist was then instructed to remove the ET tube.  Dr Molli Knock  then proceeded to complete the tracheostomy with stay sutures   No complications   Simonne Martinet ACNP-BC [facility] Pulmonary/Critical Care Pager # [phone] OR # [phone] if no answer  Alyson Reedy, M.D. [facility] Pulmonary/Critical Care Medicine. Pager: [phone]. After hours pager: [phone].

## 2015-07-26 NOTE — Procedures (Signed)
Bedside Tracheostomy Insertion Procedure Note   Patient Details:   Name: Lee Klein DOB: 07/19/1950 MRN: [id number]  Procedure: Tracheostomy  Pre Procedure Assessment: ET Tube Size:8.0 ET Tube secured at lip (cm):23 Bite block in place: Yes Breath Sounds: Clear  Post Procedure Assessment: BP 172/82 mmHg  Pulse 104  Temp(Src) 97.6 F (36.4 C) (Oral)  Resp 34  Ht 5\' 10"  (1.778 m)  Wt 154 lb 8.7 oz (70.1 kg)  BMI 22.17 kg/m2  SpO2 96% O2 sats: stable throughout Complications: No apparent complications Patient did tolerate procedure well Tracheostomy Brand:Shiley Tracheostomy Style:Cuffed Tracheostomy Size: 6.00 Tracheostomy Secured SHF:[id number] Tracheostomy Placement Confirmation:Trach cuff visualized and in place and Chest X ray ordered for placement    Loletta Specter 07/26/2015, 1:16 PM

## 2015-07-26 NOTE — Progress Notes (Signed)
PULMONARY / CRITICAL CARE MEDICINE   Name: Lee Klein MRN: [id number] DOB: [birth year]    ADMISSION DATE:  07/15/2015 CONSULTATION DATE: 07/09/15  REFERRING MD: Dr. Oswaldo Done / Internal Medicine  CHIEF COMPLAINT: Abdominal pain and vomiting   BRIEF PATIENT DESCRIPTION : The patient was admitted for acute on chronic nausea and vomiting. He has Crohn's disease. Also with significant comorbidities. 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 He was treated as drug-induced pancreatitis. GI and ID have been following. He had lactic acidosis. Had fluid resuscitation. Had resp distress on 2/21. Chest x-ray with bilateral infiltrates, new from admission. He went into respiratory distress. O2 sats 60% on non breather mask. Transferred to ICU. ABG prior to transfer was 7.26, PCO2 33, PO2 48 on 100% nonrebreather mask. He received 80 mg IV Lasix. He was intubated on 2/28 for prorgressive respiratory failure.  SUBJECTIVE:  Improving FiO2 and PEEP.  VITAL SIGNS: BP 142/68 mmHg  Pulse 103  Temp(Src) 97.6 F (36.4 C) (Oral)  Resp 34  Ht 5\' 10"  (1.778 m)  Wt 70.1 kg (154 lb 8.7 oz)  BMI 22.17 kg/m2  SpO2 94%   PHYSICAL EXAMINATION:  General: sedated on vent. Paralyzed.  HENT: NCAT ETT in place PULM: vent supported breaths. B crackles CV: RRR, no mgr GI: ostomy in place Derm: some arm edema, bruising throughout extensor surfaces, scrotum and inner thigh with erythematous rash Neuro: sedated on vent for my exam More edema today.    HEMODYNAMICS: CVP:  [3 mmHg] 3 mmHg  VENTILATOR SETTINGS: Vent Mode:  [-] PRVC FiO2 (%):  [45 %-50 %] 50 % Set Rate:  [34 bmp-35 bmp] 34 bmp Vt Set:  [400 mL] 400 mL PEEP:  [8 cmH20] 8 cmH20 Plateau Pressure:  [27 cmH20-41 cmH20] 40 cmH20  INTAKE / OUTPUT: I/O last 3 completed shifts: In: 4990.1 [I.V.:2459.7; Other:170; NG/GT:1116.7; IV Piggyback:1243.8] Out: 7905 [Urine:7305;  Stool:600]   LABS:  BMET  Recent Labs Lab 07/24/15 0427 07/25/15 0415 07/26/15 0517  NA 146* 150* 154*  K 3.7 3.1* 3.0*  CL 113* 108 110  CO2 27 31 36*  BUN 56* 54* 64*  CREATININE 1.53* 1.46* 1.36*  GLUCOSE 100* 113* 110*   Electrolytes  Recent Labs Lab 07/24/15 0427 07/25/15 0415 07/26/15 0517  CALCIUM 8.0* 8.3* 8.7*  MG 2.1 2.1 1.9  PHOS 4.9* 5.3* 3.6   CBC  Recent Labs Lab 07/24/15 0427 07/25/15 0415 07/26/15 0517  WBC 20.4* 13.7* 11.8*  HGB 9.4* 9.0* 7.5*  HCT 29.0* 27.3* 23.6*  PLT 106* 82* 76*   Coag's  Recent Labs Lab 2015/07/25 2045  APTT 30  INR 1.56*   Sepsis Markers No results for input(s): LATICACIDVEN, PROCALCITON, O2SATVEN in the last 168 hours.  ABG  Recent Labs Lab 07/24/15 0419 07/25/15 0405 07/26/15 0300  PHART 7.355 7.323* 7.368  PCO2ART 48.7* 63.9* 65.5*  PO2ART 70.0* 61.9* 68.7*   Liver Enzymes  Recent Labs Lab 07/25/2015 0306 07/25/15 0415  AST 30 32  ALT 37 36  ALKPHOS 146* 111  BILITOT 0.9 0.9  ALBUMIN 1.9* 3.0*   Cardiac Enzymes  Recent Labs Lab 07-25-15 0640 2015/07/25 1234 2015/07/25 1905  TROPONINI <0.03 0.03 <0.03    Glucose  Recent Labs Lab 07/25/15 1108 07/25/15 1517 07/25/15 2031 07/25/15 2329 07/26/15 0353 07/26/15 0707  GLUCAP 155* 154* 119* 127* 108* 128*    Imaging Dg Chest Port 1 View  07/26/2015  CLINICAL DATA:  Intubation. EXAM: PORTABLE CHEST 1  VIEW COMPARISON:  07/25/2015.  CT 08/13/2015. FINDINGS: Endotracheal tube, NG tube, feeding tube, left IJ line, right PICC line in stable position.Stable mild mediastinal prominence. Previously identified mild pneumomediastinum best identified by prior CT of 08/16/2015. Stable cardiomegaly. Diffuse bilateral airspace disease slightly improved from prior exam. Small bilateral pleural effusions cannot be excluded. No definite pneumothorax. Biapical pleural thickening again noted. Linear opacity over the right apex most likely related to pleural  thickening, attention to this area on subsequent chest x-rays to exclude subtle pneumothorax. IMPRESSION: 1. Lines and tubes in stable position. 2. Persistent but improving bilateral airspace disease. Small bilateral pleural effusions cannot be excluded. 3. Biapical pleural thickening again noted. Linear density noted over the right apex most likely related to pleural thickening. Attention to this region on subsequent chest x-rays to exclude subtle pneumothorax suggested. Electronically Signed   By: Maisie Fus  Register   On: 07/26/2015 07:28   SIGNIFICANT EVENTS:  07/12/2015 admitted for nausea vomiting, possible drug-induced pancreatitis 07/09/15 went into resp distress: O2 sats 60% on NRBM; transferred to ICU  07/12/15 marked fluid overload unable to wean off bipap 07/16/15 Intubated 3/7 attempted to get off paralytics with significant desaturation and asynchrony  STUDIES : [name] Abd (2/17) >> Heterogeneous echogenicity of pancreas which could due pancreatic inflammation; small ascites; fatty liver 2D echo (05/2014) >> EF 50%, CHFpEF CT Abd/pelvis - negative for pancreatic necrosis or abscess or complication related to Crohn's disease. New moderate bilateral pleural effusions. New moderate volume of abdominal ascites. 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  Echo 2/26 - normal EF, G1DD  IMAGING :   CULTURES: Blood culture (2/21) >> GNR, Serratia BCx (2/22) >> NGTD  ANTIBIOTICS :  Clofazimine 2/17 >> 2/21 Azithromycin 2/17 >> 2/21 Tigecycline 2/17 -2/20 Vanc 2/21>> 2/22 Meropenem 2/21>>2/24>>>3/7>>> Ceftazidine 2/24>> 2/25 Ceftriaxone 2/25 >> 3/7  LINES:  R PICC --removed PICC 3 lumens 3/6>>> LIJ 2/21>> 2/27 L PICC 2/27 > 3/6 Right ileostomy  ETT 2/28>>> R IJ TLC 3/6>>>3/8 L IJ TLC 3/8>>>  DISCUSSION: 65 y/o male with Crohn's disease, long standing protein calorie malnutrition, ostomy who was admitted on 2/21 with acute pancreatitis and has struggled with acute  respiratory failure with hypoxemia likely due to ARDS and pulmonary edema. Intubated 2/28.  ASSESSMENT/PLAN:  PULMONARY A:  Acute hypoxemic respiratory failure secondary: Pulmonary edema, ARDS ?aspiration. P: Continue full vent support.  Paralyze x24 more hours. Xopenex nebulized q8h Continue active diureses for one more day.  CARDIOVASCULAR A:  Diastolic dysfunction based on 1610/9604 echo, EF nml. Not known to have CAD  P:  D/C lopressor. D/C Levophed and Neo. Lasix as below.  RENAL A:  Acute kidney injury Hypernatremia -- better Pulm edema P:  Free water increase 250 ml q4 hours. Lasix 10 mg/hr IV drip x24 hours. Zaroxolyn 10 mg PO x1. Albumin 25% 25 gm IV q6 hours x4 doses. Monitor BMET and UOP. Replace electrolytes as needed.  GASTROINTESTINAL A:  Crohn's disease requiring chronic steroids Drug-induced pancreatitis (Tigecycline) - Lipase improved Chronic nausea, vomiting History of antibiotic related diarrhea Severe malnutrition P:  Continue TF. D/C TPN. Continue stress ulcer prophylaxis.  HEMATOLOGIC A:  Thrombocytopenia, chronic, stable H/O left lower extremity DVT in October 2016. By history, unprovoked. He was on Xarelto outpatient. Bilateral venous dopplers of legs neg for DVT Leukocytosis  P:  Continue prophylactic heparin since plts >50k.  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 Abx for 1 yr. Serratia bacteremia -PICC on admission likely source Fungal rash groin P:  Per ID:  D/Ced rocephin. Continue merem. Diflucan 1 dose complete Clotrimazole cream groin Acyclovir 3/8>>>3/12.  ENDOCRINE A: Chronic adrenal insufficiency  P:  Decrease Hydrocortisone to 20 mg IV BID. Monitor sugars SSI  NEUROLOGIC A: Anxiety,  chronic Sedation needs for vent synchrony P:  RASS goal: -5 Maintain paralyzed another 24 hours.  FAMILY  - Updates: Family updated at length bedside.  The patient is critically ill with multiple organ systems failure and requires high complexity decision making for assessment and support, frequent evaluation and titration of therapies, application of advanced monitoring technologies and extensive interpretation of multiple databases.   Critical Care Time devoted to patient care services described in this note is  35  Minutes. This time reflects time of care of this signee Dr Koren Bound. This critical care time does not reflect procedure time, or teaching time or supervisory time of PA/NP/Med student/Med Resident etc but could involve care discussion time.  Alyson Reedy, M.D. [facility] Pulmonary/Critical Care Medicine. Pager: [phone]. After hours pager: [phone].  07/26/2015, 9:38 AM

## 2015-07-26 NOTE — Progress Notes (Signed)
eLink Physician-Brief Progress Note Patient Name: Lee Klein DOB: 1950-08-28 MRN: [id number]   Date of Service  07/26/2015  HPI/Events of Note  Hypokalemia  eICU Interventions  Potassium replaced     Intervention Category Intermediate Interventions: Electrolyte abnormality - evaluation and management  Jerrad Mendibles 07/26/2015, 6:24 AM

## 2015-07-26 NOTE — Progress Notes (Signed)
Pharmacy Antibiotic Note  Lee Klein is a 65 y.o. male admitted on 06/28/2015 now with serratia bacteremia.  Currently on meropenem dosing due to concern for inducible b-lactamase production by serratia. Despite multiple days of targeted therapy, WBC improving, Afebrile. CrCl ~ 50 mL/min. Acyclovir for possible HSV infection.  Plan: Start acyclovir 10 mg/kg (730 mg) Q 8 hours - to end 3/12 Continue meropenem 1 gm IV Q 12 hours - f/u deescalation Monitor CBC, renal fx, cultures and clinical progress  Height: 5\' 10"  (177.8 cm) Weight: 154 lb 8.7 oz (70.1 kg) IBW/kg (Calculated) : 73  Temp (24hrs), Avg:98.6 F (37 C), Min:97.6 F (36.4 C), Max:99.5 F (37.5 C)   Recent Labs Lab 07/22/15 0452 08/09/2015 0306 07/18/2015 2045 07/24/15 0427 07/25/15 0415 07/26/15 0517  WBC 32.6* 24.6* 19.8* 20.4* 13.7* 11.8*  CREATININE 1.77* 1.95*  --  1.53* 1.46* 1.36*    Estimated Creatinine Clearance: 55.8 mL/min (by C-G formula based on Cr of 1.36).    Allergies  Allergen Reactions  . Amikacin Other (See Comments)    CAUSED DEAFNESS  . Cefoxitin Nausea Only    ABD. PAIN  . Fish Allergy Nausea And Vomiting  . Iodine Nausea Only    JOINT PAIN  . Ivp Dye [Iodinated Diagnostic Agents] Nausea Only    JOINT PAIN  . Rifampin Nausea And Vomiting and Other (See Comments)    CHILLS  . Shellfish Allergy Swelling  . Vancomycin Other (See Comments)    Red Man Syndrome - was told that it was given to fast.  . Demerol Rash    Antimicrobials this admission: Clofazimine and azith stopped 2/21 Vanc 2/21 >> 2/22 Merrem 2/21 >> 2/24; 3/7>>  Fortaz 2/24 >>2/25 CTX 2/25 >> 3/7 Fluc x1 3/3 3/8 Acyclovir>>  Dose adjustments this admission: None   Microbiology results: 2/21 UA - clean 2/21 BCx: serratia (R to ancef) 2/22 repeat BCx: negative 2/21 MRSA PCR neg 3/6 BAL>> ngtd  3/8 BAL: ngtd  Thank you for allowing pharmacy to be a part of this patient's care.    Agapito Games, PharmD,  BCPS Clinical Pharmacist 07/26/2015 1:53 PM

## 2015-07-26 NOTE — Care Management Note (Signed)
Case Management Note  Patient Details  Name: Lee Klein MRN: [id number] Date of Birth: 01/17/1951  Subjective/Objective:      Pt transferred to ICU for respiratory distress requiring BIPAP     Action/Plan:    07/26/2015  Pt trached earlier today remains on vent.  CM spoke with Physician Advisor; pt is appropriate at this time for [facility].  CM spoke with family and provided choice - wife chose [facility].  [facility] liaison to meet with family Monday.  [facility]ACH referral requested  07/19/15  Pt remains intubated with wife at bedside.    07/16/15 Pt had to intubated today due to increase in resp distress  07/12/15 Pt remains on BIPAP - unable to wean, pt is on; TNA, IV lasix, IV solumedrol, IV antibiotics for serretia bacteremia.  CM will continue to monitor for disposition needs  07/09/15 Pt discharged home in December 2016 with [facility] for antibiotics (IV Tygacil).  Pt is from home at wife [name] (RN), pt also has home O2 with [facility].  CM contacted agency and informed of admit.  Expected Discharge Date:                  Expected Discharge Plan:  Home w Home Health Services  In-House Referral:     Discharge planning Services  CM Consult  Post Acute Care Choice:  Resumption of Svcs/PTA Provider Choice offered to:  Patient  DME Arranged:    DME Agency:     HH Arranged:  RN HH Agency:  [facility]  Status of Service:  In process, will continue to follow  Medicare Important Message Given:    Date Medicare IM Given:    Medicare IM give by:    Date Additional Medicare IM Given:    Additional Medicare Important Message give by:     If discussed at Long Length of Stay Meetings, dates discussed:    Additional Comments:  Cherylann Parr, RN 07/26/2015, 3:17 PM

## 2015-07-27 ENCOUNTER — Inpatient Hospital Stay (HOSPITAL_COMMUNITY): Payer: Managed Care, Other (non HMO)

## 2015-07-27 ENCOUNTER — Telehealth (INDEPENDENT_AMBULATORY_CARE_PROVIDER_SITE_OTHER): Payer: Self-pay | Admitting: Internal Medicine

## 2015-07-27 DIAGNOSIS — J939 Pneumothorax, unspecified: Secondary | ICD-10-CM | POA: Insufficient documentation

## 2015-07-27 LAB — BASIC METABOLIC PANEL
ANION GAP: 9 (ref 5–15)
BUN: 61 mg/dL — AB (ref 6–20)
CALCIUM: 9.4 mg/dL (ref 8.9–10.3)
CO2: 44 mmol/L — ABNORMAL HIGH (ref 22–32)
Chloride: 104 mmol/L (ref 101–111)
Creatinine, Ser: 1.14 mg/dL (ref 0.61–1.24)
GFR calc Af Amer: >60 mL/min (ref >60)
GLUCOSE: 137 mg/dL — AB (ref 65–99)
POTASSIUM: 2.4 mmol/L — AB (ref 3.5–5.1)
SODIUM: 157 mmol/L — AB (ref 135–145)

## 2015-07-27 LAB — BLOOD GAS, ARTERIAL
Acid-Base Excess: 15.7 mmol/L — ABNORMAL HIGH (ref 0.0–2.0)
Bicarbonate: 41.8 mEq/L — ABNORMAL HIGH (ref 20.0–24.0)
DRAWN BY: 404151
FIO2: 0.5
MECHVT: 400 mL
O2 SAT: 94.1 %
PATIENT TEMPERATURE: 97.5
PCO2 ART: 71.9 mmHg — AB (ref 35.0–45.0)
PEEP: 8 cmH2O
PO2 ART: 76.1 mmHg — AB (ref 80.0–100.0)
RATE: 34 resp/min
TCO2: 44.1 mmol/L (ref 0–100)
pH, Arterial: 7.379 (ref 7.350–7.450)

## 2015-07-27 LAB — CBC
HCT: 29.5 % — ABNORMAL LOW (ref 39.0–52.0)
Hemoglobin: 9 g/dL — ABNORMAL LOW (ref 13.0–17.0)
MCH: 31.1 pg (ref 26.0–34.0)
MCHC: 30.5 g/dL (ref 30.0–36.0)
MCV: 102.1 fL — ABNORMAL HIGH (ref 78.0–100.0)
Platelets: 82 10*3/uL — ABNORMAL LOW (ref 150–400)
RBC: 2.89 MIL/uL — ABNORMAL LOW (ref 4.22–5.81)
RDW: 18.1 % — ABNORMAL HIGH (ref 11.5–15.5)
WBC: 14.9 10*3/uL — ABNORMAL HIGH (ref 4.0–10.5)

## 2015-07-27 LAB — MAGNESIUM: MAGNESIUM: 2.3 mg/dL (ref 1.7–2.4)

## 2015-07-27 LAB — CULTURE, BAL-QUANTITATIVE W GRAM STAIN
Colony Count: NO GROWTH
Colony Count: NO GROWTH
Culture: NO GROWTH

## 2015-07-27 LAB — GLUCOSE, CAPILLARY
GLUCOSE-CAPILLARY: 137 mg/dL — AB (ref 65–99)
GLUCOSE-CAPILLARY: 148 mg/dL — AB (ref 65–99)
Glucose-Capillary: 139 mg/dL — ABNORMAL HIGH (ref 65–99)
Glucose-Capillary: 142 mg/dL — ABNORMAL HIGH (ref 65–99)
Glucose-Capillary: 112 mg/dL — ABNORMAL HIGH (ref 65–99)

## 2015-07-27 LAB — CULTURE, BAL-QUANTITATIVE: CULTURE: NO GROWTH

## 2015-07-27 LAB — PHOSPHORUS: Phosphorus: 2.5 mg/dL (ref 2.5–4.6)

## 2015-07-27 MED ORDER — POTASSIUM CHLORIDE 20 MEQ/15ML (10%) PO SOLN
40.0000 meq | ORAL | Status: AC
  Administered 2015-07-27 (×3): 40 meq
  Filled 2015-07-27 (×4): qty 30

## 2015-07-27 MED ORDER — LIDOCAINE HCL (PF) 1 % IJ SOLN
INTRAMUSCULAR | Status: AC
  Administered 2015-07-27: 5 mL
  Filled 2015-07-27: qty 5

## 2015-07-27 MED ORDER — SODIUM CHLORIDE 0.9 % IV SOLN
1.0000 g | Freq: Three times a day (TID) | INTRAVENOUS | Status: AC
  Administered 2015-07-27 – 2015-07-29 (×8): 1 g via INTRAVENOUS
  Filled 2015-07-27 (×8): qty 1

## 2015-07-27 MED ORDER — METOPROLOL TARTRATE 25 MG/10 ML ORAL SUSPENSION
12.5000 mg | Freq: Two times a day (BID) | ORAL | Status: DC
  Administered 2015-07-27 – 2015-07-28 (×3): 12.5 mg
  Filled 2015-07-27 (×5): qty 5

## 2015-07-27 MED ORDER — FREE WATER
300.0000 mL | Status: DC
  Administered 2015-07-27 – 2015-07-31 (×24): 300 mL

## 2015-07-27 NOTE — Procedures (Signed)
Lee Klein [id number] 1950/06/19  Procedure: Right chest tube insertion  Indication: Right pneumothorax  Done in ICU.  Consent from wife over phone.  Given 5 mg 1% lidocaine.  Incision made at 5th right rib space mid axillary line.  Blunt dissection to intercostal muscle, and then punctured into intercostal space and pleural space.  Finger sweep performed and no adhesion.  About 150 ml of serosanginous fluid from opening.  Inserted #28Fr chest tube to 17 cm.  Connected to suction -20 cm.  No immediate complications.  Post-procedure CXR pending.  Coralyn Helling, MD [facility] Pulmonary/Critical Care 07/27/2015, 8:25 AM Pager:  [phone] After 3pm call: [phone]

## 2015-07-27 NOTE — Telephone Encounter (Signed)
Call returned talk with Keneth's wife [name].

## 2015-07-27 NOTE — Telephone Encounter (Signed)
Ms. Elita Quick Gobert's call was returned. I am aware that then he is quite ill with multiple problems. He was hospitalized on July 18, 2015 remains critically ill. I encouraged her to to call if she has any questions or concerns. She is very thankful other treatment that her husband is receiving. I hope he has full recovery and can return to his baseline status.

## 2015-07-27 NOTE — Progress Notes (Signed)
Per Dr. Molli Knock stop Nimbex drip.

## 2015-07-27 NOTE — Progress Notes (Signed)
PULMONARY / CRITICAL CARE MEDICINE   Name: SYE SCHROEPFER MRN: [id number] DOB: Jan 10, 1951    ADMISSION DATE:  2015/07/11 CONSULTATION DATE: 07/09/15  REFERRING MD: Dr. Oswaldo Done / Internal Medicine  CHIEF COMPLAINT: Abdominal pain and vomiting   BRIEF PATIENT DESCRIPTION : The patient was admitted for acute on chronic nausea and vomiting. He has Crohn's disease. Also with significant comorbidities. 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 He was treated as drug-induced pancreatitis. GI and ID have been following. He had lactic acidosis. Had fluid resuscitation. Had resp distress on 2/21. Chest x-ray with bilateral infiltrates, new from admission. He went into respiratory distress. O2 sats 60% on non breather mask. Transferred to ICU. ABG prior to transfer was 7.26, PCO2 33, PO2 48 on 100% nonrebreather mask. He received 80 mg IV Lasix. He was intubated on 2/28 for prorgressive respiratory failure.  SUBJECTIVE:  Trach placed yesterday  Small right sided PTX noted this am CXR  Remains on paralytics.  Neg bal with diuresis    VITAL SIGNS: BP 169/99 mmHg  Pulse 127  Temp(Src) 97.5 F (36.4 C) (Oral)  Resp 34  Ht 5\' 10"  (1.778 m)  Wt 154 lb 8.7 oz (70.1 kg)  BMI 22.17 kg/m2  SpO2 95%   PHYSICAL EXAMINATION:  General: sedated on vent. Paralyzed.  HENT: NCAT, Trach midline, dsg scant dry blood PULM: vent supported breaths. B crackles CV: tachy , no m/r/g GI: ostomy in place Derm: some arm edema, bruising throughout extensor surfaces, scrotum and inner thigh , forearms Bruising along neck w/ hematoma right supraclavicular area  Neuro: sedated/on paralytics     HEMODYNAMICS: CVP:  [8 mmHg] 8 mmHg  VENTILATOR SETTINGS: Vent Mode:  [-] PRVC FiO2 (%):  [50 %] 50 % Set Rate:  [34 bmp] 34 bmp Vt Set:  [400 mL] 400 mL PEEP:  [8 cmH20] 8 cmH20 Plateau Pressure:  [30 cmH20-40 cmH20] 35 cmH20  INTAKE / OUTPUT: I/O last 3 completed  shifts: In: 4830.2 [I.V.:2326; Other:350; NG/GT:875; IV Piggyback:1279.2] Out: 7505 [Urine:7080; Stool:425]   LABS:  BMET  Recent Labs Lab 07/25/15 0415 07/26/15 0517 07/27/15 0533  NA 150* 154* 157*  K 3.1* 3.0* 2.4*  CL 108 110 104  CO2 31 36* 44*  BUN 54* 64* 61*  CREATININE 1.46* 1.36* 1.14  GLUCOSE 113* 110* 137*   Electrolytes  Recent Labs Lab 07/25/15 0415 07/26/15 0517 07/27/15 0533  CALCIUM 8.3* 8.7* 9.4  MG 2.1 1.9 2.3  PHOS 5.3* 3.6 2.5   CBC  Recent Labs Lab 07/25/15 0415 07/26/15 0517 07/27/15 0533  WBC 13.7* 11.8* 14.9*  HGB 9.0* 7.5* 9.0*  HCT 27.3* 23.6* 29.5*  PLT 82* 76* 82*   Coag's  Recent Labs Lab 07/29/2015 2045  APTT 30  INR 1.56*   Sepsis Markers No results for input(s): LATICACIDVEN, PROCALCITON, O2SATVEN in the last 168 hours.  ABG  Recent Labs Lab 07/25/15 0405 07/26/15 0300 07/27/15 0437  PHART 7.323* 7.368 7.379  PCO2ART 63.9* 65.5* 71.9*  PO2ART 61.9* 68.7* 76.1*   Liver Enzymes  Recent Labs Lab 08/11/2015 0306 07/25/15 0415  AST 30 32  ALT 37 36  ALKPHOS 146* 111  BILITOT 0.9 0.9  ALBUMIN 1.9* 3.0*   Cardiac Enzymes  Recent Labs Lab 08/06/2015 0640 08/03/2015 1234 07/17/2015 1905  TROPONINI <0.03 0.03 <0.03    Glucose  Recent Labs Lab 07/26/15 0707 07/26/15 1119 07/26/15 1552 07/26/15 2000 07/26/15 2325 07/27/15 0321  GLUCAP 128* 110* 110*  120* 137* 139*    Imaging Dg Chest Port 1 View  07/27/2015  CLINICAL DATA:  ETT. EXAM: PORTABLE CHEST 1 VIEW COMPARISON:  July 26, 2015 FINDINGS: The tracheostomy tube is in good position as is the left-sided central line. A feeding tube terminates below today's film. There is a right sided pneumothorax measuring 15 mm in thickness near the apex. There is lucency in the soft tissues of the base of the neck as well as in the superior mediastinum to the left. These findings suggest mediastinal air as well as air in the soft tissues of the neck. A right PICC  line is stable. The cardiomediastinal silhouette is unchanged. Bilateral diffuse opacities persist but are slightly less prominent. However, there is opacity in the right base which is low more focal in the interval. More focal opacity in the left base is stable. No other changes. IMPRESSION: 1. 15 mm right-sided pneumothorax not appreciated previously. 2. Suggested mediastinal air with extension into the soft tissues of the neck. 3. Bilateral pulmonary opacities persist as described above. The opacity in the right base is a little more prominent in the interval. Findings called to the patient's nurse, Mr. Toy Cookey Electronically Signed   By: Gerome Sam III M.D   On: 07/27/2015 07:17   Dg Chest Port 1 View  07/26/2015  CLINICAL DATA:  Tracheostomy tube placement.  Respiratory failure. EXAM: PORTABLE CHEST 1 VIEW COMPARISON:  07/26/2015 and prior radiographs FINDINGS: Cardiomediastinal silhouette is unchanged. A tracheostomy tube is identified without definite complicating features. An endotracheal tube and NG tube have been removed. Small bore feeding tube entering the stomach, right PICC line with tip overlying the lower SVC, and left IJ central venous catheter with tip overlying the left brachiocephalic vein again noted. Diffuse bilateral airspace opacities are unchanged. There is no evidence of pneumothorax. IMPRESSION: Tracheostomy tube placement without definite complicating features. Endotracheal and NG tubes removed. Unchanged bilateral airspace opacities. Electronically Signed   By: Harmon Pier M.D.   On: 07/26/2015 13:50   SIGNIFICANT EVENTS:  07/01/2015 admitted for nausea vomiting, possible drug-induced pancreatitis 07/09/15 went into resp distress: O2 sats 60% on NRBM; transferred to ICU  07/12/15 marked fluid overload unable to wean off bipap 07/16/15 Intubated 3/7 attempted to get off paralytics with significant desaturation and asynchrony 3/11 Right sided PTX>Chest tube   STUDIES : [name]  Abd (2/17) >> Heterogeneous echogenicity of pancreas which could due pancreatic inflammation; small ascites; fatty liver 2D echo (05/2014) >> EF 50%, CHFpEF CT Abd/pelvis - negative for pancreatic necrosis or abscess or complication related to Crohn's disease. New moderate bilateral pleural effusions. New moderate volume of abdominal ascites. 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  Echo 2/26 - normal EF, G1DD  IMAGING :   CULTURES: Blood culture (2/21) >> GNR, Serratia BCx (2/22) >> NGTD  ANTIBIOTICS :  Clofazimine 2/17 >> 2/21 Azithromycin 2/17 >> 2/21 Tigecycline 2/17 -2/20 Vanc 2/21>> 2/22 Meropenem 2/21>>2/24>>>3/7>>> Ceftazidine 2/24>> 2/25 Ceftriaxone 2/25 >> 3/7 Acyclovir 3/8 >>  LINES:  R PICC --removed PICC 3 lumens 3/6>>> LIJ 2/21>> 2/27 L PICC 2/27 > 3/6 Right ileostomy  ETT 2/28>>>3/10 R IJ TLC 3/6>>>3/8 L IJ TLC 3/8>>> Trach 3/10  R Chest tube 3/11  >>  DISCUSSION: 65 y/o male with Crohn's disease, long standing protein calorie malnutrition, ostomy who was admitted on 2/21 with acute pancreatitis and has struggled with acute respiratory failure with hypoxemia likely due to ARDS and pulmonary edema. Intubated 2/28.  ASSESSMENT/PLAN:  PULMONARY A:  Acute hypoxemic respiratory failure secondary: Pulmonary edema, ARDS ?aspiration. >Paralytics started 3/5  Right PTX  P: Continue full vent support.  Xopenex nebulized q8h Continue active diureses for one more day. Place chest tube   CARDIOVASCULAR A:  Diastolic dysfunction based on 8119/1478 echo, EF nml. Not known to have CAD HTN   P:  Continue  Lasix  Add metoprolol    RENAL A:  Acute kidney injury Hypernatremia >Na tr up  Hypokalemia  Pulm edema P:  Free water increase 300 q4 hours. Lasix 10 mg/hr IV drip x24 hours. Monitor BMET and UOP. Replace electrolytes as needed. K+ replaced   GASTROINTESTINAL A:  Crohn's disease requiring chronic  steroids Drug-induced pancreatitis (Tigecycline) - Lipase improved Chronic nausea, vomiting History of antibiotic related diarrhea Severe malnutrition P:  Continue TF.  Continue stress ulcer prophylaxis.  HEMATOLOGIC A:  Thrombocytopenia, chronic, stable H/O left lower extremity DVT in October 2016. By history, unprovoked. He was on Xarelto outpatient. Bilateral venous dopplers of legs neg for DVT Leukocytosis  P:  Continue prophylactic heparin since plts >50k.  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 Abx for 1 yr. Serratia bacteremia -PICC on admission likely source Fungal rash groin P:  Per ID:   Continue merem. Clotrimazole cream groin Acyclovir 3/8>>>3/12.  ENDOCRINE A: Chronic adrenal insufficiency  P:  Cont cortisone to 20 mg IV BID. Monitor sugars SSI  NEUROLOGIC A: Anxiety, chronic Sedation needs for vent synchrony P:  RASS goal: -5 Maintain paralyzed another 24 hours.  FAMILY  - Updates: wife called per nurse for chest tube consent , will speak with on arrival to unit.    Tammy Parrett NP-C  [facility]   07/27/2015, 7:37 AM   Attending Note:  65 year old male with ARDS and PNA from influenza, prolonged hospitalization.  Trached on 3/10.  No PEG.  PTX overnight requiring CT placement.  On exam crepitus noted.  Lungs remain coarse bilaterally but diuresed very well overnight and renal function is normal at this point.  Electrolytes replaced.  Will d/c further diureses after this lasix drip is complete and will stop nimbex today and evaluate.  Continue sedation however.  Anticipate if paralytics stopped today goes well that we will begin weaning trials in AM.  No family bedside to update today.  The patient is critically ill  with multiple organ systems failure and requires high complexity decision making for assessment and support, frequent evaluation and titration of therapies, application of advanced monitoring technologies and extensive interpretation of multiple databases.   Critical Care Time devoted to patient care services described in this note is  35  Minutes. This time reflects time of care of this signee Dr Koren Bound. This critical care time does not reflect procedure time, or teaching time or supervisory time of PA/NP/Med student/Med Resident etc but could involve care discussion time.  Alyson Reedy, M.D. [facility] Pulmonary/Critical Care Medicine. Pager: [phone]. After hours pager: [phone].

## 2015-07-27 NOTE — Progress Notes (Signed)
eLink Physician-Brief Progress Note Patient Name: Lee Klein DOB: 01/02/1951 MRN: [id number]   Date of Service  07/27/2015  HPI/Events of Note  Hypokalemia  eICU Interventions  Potassium replaced     Intervention Category Intermediate Interventions: Electrolyte abnormality - evaluation and management  DETERDING,ELIZABETH 07/27/2015, 6:47 AM

## 2015-07-27 NOTE — Progress Notes (Signed)
Upon morning report RN noted subcutaneous air right side neck, Radiologist then called with report of right side pneumothotrax. CCM Dr. Craige Cotta notified and wife notified as well, a telephone consent was obtained. Dr. Craige Cotta is now at the bedside for chest tube placement. Will continue to monitor closely.  Corliss Skains RN

## 2015-07-28 ENCOUNTER — Inpatient Hospital Stay (HOSPITAL_COMMUNITY): Payer: Managed Care, Other (non HMO)

## 2015-07-28 LAB — BLOOD GAS, ARTERIAL
ACID-BASE EXCESS: 21.7 mmol/L — AB (ref 0.0–2.0)
Bicarbonate: 47.4 mEq/L — ABNORMAL HIGH (ref 20.0–24.0)
DRAWN BY: 40415
FIO2: 0.5
MECHVT: 400 mL
O2 Saturation: 96.2 %
PEEP: 8 cmH2O
Patient temperature: 98.6
RATE: 34 resp/min
TCO2: 49.4 mmol/L (ref 0–100)
pCO2 arterial: 66.8 mmHg (ref 35.0–45.0)
pH, Arterial: 7.465 — ABNORMAL HIGH (ref 7.350–7.450)
pO2, Arterial: 86.1 mmHg (ref 80.0–100.0)

## 2015-07-28 LAB — BASIC METABOLIC PANEL
ANION GAP: 13 (ref 5–15)
BUN: 64 mg/dL — ABNORMAL HIGH (ref 6–20)
CALCIUM: 9 mg/dL (ref 8.9–10.3)
CO2: 47 mmol/L — AB (ref 22–32)
Chloride: 98 mmol/L — ABNORMAL LOW (ref 101–111)
Creatinine, Ser: 1.01 mg/dL (ref 0.61–1.24)
Glucose, Bld: 124 mg/dL — ABNORMAL HIGH (ref 65–99)
Potassium: 2.9 mmol/L — ABNORMAL LOW (ref 3.5–5.1)
Sodium: 158 mmol/L — ABNORMAL HIGH (ref 135–145)

## 2015-07-28 LAB — CBC
HEMATOCRIT: 32.1 % — AB (ref 39.0–52.0)
Hemoglobin: 9.5 g/dL — ABNORMAL LOW (ref 13.0–17.0)
MCH: 30.1 pg (ref 26.0–34.0)
MCHC: 29.6 g/dL — ABNORMAL LOW (ref 30.0–36.0)
MCV: 101.6 fL — ABNORMAL HIGH (ref 78.0–100.0)
Platelets: 82 10*3/uL — ABNORMAL LOW (ref 150–400)
RBC: 3.16 MIL/uL — ABNORMAL LOW (ref 4.22–5.81)
RDW: 17.3 % — AB (ref 11.5–15.5)
WBC: 16.6 10*3/uL — AB (ref 4.0–10.5)

## 2015-07-28 LAB — PHOSPHORUS: PHOSPHORUS: 1.5 mg/dL — AB (ref 2.5–4.6)

## 2015-07-28 LAB — GLUCOSE, CAPILLARY
GLUCOSE-CAPILLARY: 124 mg/dL — AB (ref 65–99)
GLUCOSE-CAPILLARY: 129 mg/dL — AB (ref 65–99)
GLUCOSE-CAPILLARY: 131 mg/dL — AB (ref 65–99)
GLUCOSE-CAPILLARY: 138 mg/dL — AB (ref 65–99)
Glucose-Capillary: 111 mg/dL — ABNORMAL HIGH (ref 65–99)
Glucose-Capillary: 159 mg/dL — ABNORMAL HIGH (ref 65–99)
Glucose-Capillary: 147 mg/dL — ABNORMAL HIGH (ref 65–99)

## 2015-07-28 LAB — MAGNESIUM: MAGNESIUM: 2 mg/dL (ref 1.7–2.4)

## 2015-07-28 MED ORDER — METOPROLOL TARTRATE 25 MG/10 ML ORAL SUSPENSION
25.0000 mg | Freq: Two times a day (BID) | ORAL | Status: DC
  Administered 2015-07-29 – 2015-07-31 (×5): 25 mg
  Filled 2015-07-28 (×9): qty 10

## 2015-07-28 MED ORDER — SODIUM CHLORIDE 0.9 % IV BOLUS (SEPSIS)
1000.0000 mL | Freq: Once | INTRAVENOUS | Status: AC
  Administered 2015-07-28: 1000 mL via INTRAVENOUS

## 2015-07-28 MED ORDER — POTASSIUM CHLORIDE 10 MEQ/50ML IV SOLN
10.0000 meq | INTRAVENOUS | Status: AC
Start: 2015-07-28 — End: 2015-07-28
  Administered 2015-07-28 (×4): 10 meq via INTRAVENOUS
  Filled 2015-07-28 (×4): qty 50

## 2015-07-28 MED ORDER — ACETAZOLAMIDE SODIUM 500 MG IJ SOLR
250.0000 mg | Freq: Four times a day (QID) | INTRAMUSCULAR | Status: DC
  Administered 2015-07-28 (×2): 250 mg via INTRAVENOUS
  Filled 2015-07-28 (×3): qty 250

## 2015-07-28 NOTE — Progress Notes (Signed)
PULMONARY / CRITICAL CARE MEDICINE   Name: Lee Klein MRN: [id number] DOB: 16-Jan-1951    ADMISSION DATE:  07/04/2015 CONSULTATION DATE: 07/09/15  REFERRING MD: Dr. Oswaldo Done / Internal Medicine  CHIEF COMPLAINT: Abdominal pain and vomiting   BRIEF PATIENT DESCRIPTION : The patient was admitted for acute on chronic nausea and vomiting. He has Crohn's disease. Also with significant comorbidities. 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 He was treated as drug-induced pancreatitis. GI and ID have been following. He had lactic acidosis. Had fluid resuscitation. Had resp distress on 2/21. Chest x-ray with bilateral infiltrates, new from admission. He went into respiratory distress. O2 sats 60% on non breather mask. Transferred to ICU. ABG prior to transfer was 7.26, PCO2 33, PO2 48 on 100% nonrebreather mask. He received 80 mg IV Lasix. He was intubated on 2/28 for prorgressive respiratory failure.  SUBJECTIVE:  Trach placed yesterday  Small right sided PTX noted this am CXR  Remains on paralytics.  Neg bal with diuresis    VITAL SIGNS: BP 119/79 mmHg  Pulse 128  Temp(Src) 99.8 F (37.7 C) (Oral)  Resp 28  Ht  (1.778 m)  Wt 70.1 kg (154 lb 8.7 oz)  BMI 22.17 kg/m2  SpO2 95%   PHYSICAL EXAMINATION:  General: sedated on vent. Paralyzed.  HENT: NCAT, Trach midline, dsg scant dry blood PULM: vent supported breaths. B crackles CV: tachy , no m/r/g GI: ostomy in place Derm: some arm edema, bruising throughout extensor surfaces, scrotum and inner thigh , forearms Bruising along neck w/ hematoma right supraclavicular area  Neuro: sedated/on paralytics     HEMODYNAMICS:    VENTILATOR SETTINGS: Vent Mode:  [-] PRVC FiO2 (%):  [50 %] 50 % Set Rate:  [34 bmp] 34 bmp Vt Set:  [400 mL] 400 mL PEEP:  [8 cmH20] 8 cmH20 Plateau Pressure:  [31 cmH20] 31 cmH20  INTAKE / OUTPUT: I/O last 3 completed shifts: In: 5490.2 [I.V.:2036.4;  NG/GT:2130.8; IV Piggyback:1323] Out: 7455 [Urine:6650; Stool:500; Chest Tube:305]   LABS:  BMET  Recent Labs Lab 07/26/15 0517 07/27/15 0533 07/28/15 0716  NA 154* 157* 158*  K 3.0* 2.4* 2.9*  CL 110 104 98*  CO2 36* 44* 47*  BUN 64* 61* 64*  CREATININE 1.36* 1.14 1.01  GLUCOSE 110* 137* 124*   Electrolytes  Recent Labs Lab 07/26/15 0517 07/27/15 0533 07/28/15 0716  CALCIUM 8.7* 9.4 9.0  MG 1.9 2.3 2.0  PHOS 3.6 2.5 1.5*   CBC  Recent Labs Lab 07/26/15 0517 07/27/15 0533 07/28/15 0716  WBC 11.8* 14.9* 16.6*  HGB 7.5* 9.0* 9.5*  HCT 23.6* 29.5* 32.1*  PLT 76* 82* 82*   Coag's  Recent Labs Lab 08/07/2015 2045  APTT 30  INR 1.56*   Sepsis Markers No results for input(s): LATICACIDVEN, PROCALCITON, O2SATVEN in the last 168 hours.  ABG  Recent Labs Lab 07/26/15 0300 07/27/15 0437 07/28/15 0410  PHART 7.368 7.379 7.465*  PCO2ART 65.5* 71.9* 66.8*  PO2ART 68.7* 76.1* 86.1   Liver Enzymes  Recent Labs Lab 08/09/2015 0306 07/25/15 0415  AST 30 32  ALT 37 36  ALKPHOS 146* 111  BILITOT 0.9 0.9  ALBUMIN 1.9* 3.0*   Cardiac Enzymes  Recent Labs Lab 08/05/2015 0640 08/01/2015 1234 07/28/2015 1905  TROPONINI <0.03 0.03 <0.03    Glucose  Recent Labs Lab 07/27/15 1208 07/27/15 1606 07/27/15 2016 07/28/15 0046 07/28/15 0346 07/28/15 0911  GLUCAP 148* 112* 129* 124* 111* 159*  Imaging Dg Chest Port 1 View  07/28/2015  CLINICAL DATA:  Pneumothorax EXAM: PORTABLE CHEST 1 VIEW COMPARISON:  07/27/2015 FINDINGS: Stable right chest tube. Small right apical/basilar pneumothorax, unchanged. Associated subcutaneous emphysema overlying the right chest wall/ supraclavicular region. Suspected pneumomediastinum. Tracheostomy in satisfactory position. Mild patchy opacities in the bilateral lungs, including in the left mid lung and bilateral lung bases, grossly unchanged. No pleural effusion. The heart is normal in size. Right arm PICC terminates in the  mid SVC. Left IJ venous catheter terminates in the left brachiocephalic vein. Enteric tube looped in proximal stomach. IMPRESSION: Stable right chest tube. Small right apical/basilar pneumothorax, unchanged. Associated subcutaneous emphysema and suspected pneumomediastinum. Additional stable ancillary findings, as above. Electronically Signed   By: Charline Bills M.D.   On: 07/28/2015 08:01   SIGNIFICANT EVENTS:  06/30/2015 admitted for nausea vomiting, possible drug-induced pancreatitis 07/09/15 went into resp distress: O2 sats 60% on NRBM; transferred to ICU  07/12/15 marked fluid overload unable to wean off bipap 07/16/15 Intubated 3/7 attempted to get off paralytics with significant desaturation and asynchrony 3/11 Right sided PTX>Chest tube   STUDIES : [name] Abd (2/17) >> Heterogeneous echogenicity of pancreas which could due pancreatic inflammation; small ascites; fatty liver 2D echo (05/2014) >> EF 50%, CHFpEF CT Abd/pelvis - negative for pancreatic necrosis or abscess or complication related to Crohn's disease. New moderate bilateral pleural effusions. New moderate volume of abdominal ascites. 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  Echo 2/26 - normal EF, G1DD  IMAGING :   CULTURES: Blood culture (2/21) >> GNR, Serratia BCx (2/22) >> NGTD  ANTIBIOTICS :  Clofazimine 2/17 >> 2/21 Azithromycin 2/17 >> 2/21 Tigecycline 2/17 -2/20 Vanc 2/21>> 2/22 Meropenem 2/21>>2/24>>>3/7>>> Ceftazidine 2/24>> 2/25 Ceftriaxone 2/25 >> 3/7 Acyclovir 3/8 >>  LINES:  R PICC --removed PICC 3 lumens 3/6>>> LIJ 2/21>> 2/27 L PICC 2/27 > 3/6 Right ileostomy  ETT 2/28>>>3/10 R IJ TLC 3/6>>>3/8 L IJ TLC 3/8>>> Trach 3/10  R Chest tube 3/11  >>  DISCUSSION: 65 y/o male with Crohn's disease, long standing protein calorie malnutrition, ostomy who was admitted on 2/21 with acute pancreatitis and has struggled with acute respiratory failure with hypoxemia likely due to ARDS and  pulmonary edema. Intubated 2/28.  ASSESSMENT/PLAN:  PULMONARY A:  Acute hypoxemic respiratory failure secondary: Pulmonary edema, ARDS ?aspiration. >Paralytics started 3/5  Right PTX  P: Continue full vent support.  Xopenex nebulized q8h Continue active diureses for one more day. Place chest tube to suction. Decrease PEEP to 5 and FiO2 to 40%. Begin weaning trials in AM.  CARDIOVASCULAR A:  Diastolic dysfunction based on 1610/9604 echo, EF nml. Not known to have CAD HTN   P:  Acetazolamide for diureses and alkalosis. Add metoprolol PO  RENAL A:  Acute kidney injury Hypernatremia >Na tr up  Hypokalemia  Pulm edema P:  Free water increase 300 q4 hours. Acetazolamide for diureses and alkalosis. Monitor BMET and UOP. Replace electrolytes as needed. K+ replaced.  GASTROINTESTINAL A:  Crohn's disease requiring chronic steroids Drug-induced pancreatitis (Tigecycline) - Lipase improved Chronic nausea, vomiting History of antibiotic related diarrhea Severe malnutrition P:  Continue TF. Continue stress ulcer prophylaxis.  HEMATOLOGIC A:  Thrombocytopenia, chronic, stable H/O left lower extremity DVT in October 2016. By history, unprovoked. He was on Xarelto outpatient. Bilateral venous dopplers of legs neg for DVT Leukocytosis  P:  Continue prophylactic heparin since plts >50k.  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 Abx for 1 yr. Serratia bacteremia -PICC on admission likely source Fungal rash groin P:  Per ID:  Continue merem stop on 3/13. Clotrimazole cream groin Acyclovir 3/8>>>3/12.  ENDOCRINE A: Chronic adrenal insufficiency  P:  Cont hydrocortisone to 20 mg IV BID. Monitor sugars SSI  NEUROLOGIC A: Anxiety, chronic Sedation  needs for vent synchrony P:  RASS goal: -5 Sedation fentanyl and versed drips. Off paralytics.  FAMILY  - Updates: wife called per nurse for chest tube consent , will speak with on arrival to unit.   The patient is critically ill with multiple organ systems failure and requires high complexity decision making for assessment and support, frequent evaluation and titration of therapies, application of advanced monitoring technologies and extensive interpretation of multiple databases.   Critical Care Time devoted to patient care services described in this note is  35  Minutes. This time reflects time of care of this signee Dr Koren Bound. This critical care time does not reflect procedure time, or teaching time or supervisory time of PA/NP/Med student/Med Resident etc but could involve care discussion time.  Alyson Reedy, M.D. [facility] Pulmonary/Critical Care Medicine. Pager: [phone]. After hours pager: [phone].

## 2015-07-28 NOTE — Progress Notes (Signed)
eLink Physician-Brief Progress Note Patient Name: Lee Klein DOB: 1951-01-12 MRN: [id number]   Date of Service  07/28/2015  HPI/Events of Note  Hypotension - BP = 79/49. CVP = 4.  eICU Interventions  Will order: 1. Bolus with 0.9 NaCl 1 liter IV over 1 hour now. 2. Hold scheduled Lopressor dose. 3. Hold scheduled Diamox dose until re-evaluated by primary team in AM.     Intervention Category Major Interventions: Hypotension - evaluation and management  Lenell Antu 07/28/2015, 9:44 PM

## 2015-07-28 NOTE — Progress Notes (Signed)
CCM changed patients vent settings from [facility] to [facility]. Patients sats started to drop 1.5 hours later down to 84. RT suctioned patient and then had to increase FIO2 to 60%. RT talked to Dr Molli Knock and we decided patient would go back to [facility]. RT will continue to monitor.

## 2015-07-29 ENCOUNTER — Inpatient Hospital Stay (HOSPITAL_COMMUNITY): Payer: Managed Care, Other (non HMO)

## 2015-07-29 DIAGNOSIS — R1010 Upper abdominal pain, unspecified: Secondary | ICD-10-CM

## 2015-07-29 LAB — POCT I-STAT 3, ART BLOOD GAS (G3+)
ACID-BASE EXCESS: 15 mmol/L — AB (ref 0.0–2.0)
Acid-Base Excess: 17 mmol/L — ABNORMAL HIGH (ref 0.0–2.0)
BICARBONATE: 44.1 meq/L — AB (ref 20.0–24.0)
Bicarbonate: 44.9 mEq/L — ABNORMAL HIGH (ref 20.0–24.0)
O2 SAT: 96 %
O2 Saturation: 91 %
PO2 ART: 66 mmHg — AB (ref 80.0–100.0)
Patient temperature: 98.2
TCO2: 47 mmol/L (ref 0–100)
TCO2: 47 mmol/L (ref 0–100)
pCO2 arterial: 76.8 mmHg (ref 35.0–45.0)
pCO2 arterial: 81.4 mmHg (ref 35.0–45.0)
pH, Arterial: 7.373 (ref 7.350–7.450)
pH, Arterial: 7.341 — ABNORMAL LOW (ref 7.350–7.450)
pO2, Arterial: 89 mmHg (ref 80.0–100.0)

## 2015-07-29 LAB — BLOOD GAS, ARTERIAL
Acid-Base Excess: 17 mmol/L — ABNORMAL HIGH (ref 0.0–2.0)
Bicarbonate: 43.5 mEq/L — ABNORMAL HIGH (ref 20.0–24.0)
DRAWN BY: 40415
FIO2: 0.4
O2 SAT: 91.4 %
PCO2 ART: 81.1 mmHg — AB (ref 35.0–45.0)
PEEP: 8 cmH2O
PO2 ART: 68.1 mmHg — AB (ref 80.0–100.0)
Patient temperature: 98.6
RATE: 34 resp/min
TCO2: 45.9 mmol/L (ref 0–100)
VT: 400 mL
pH, Arterial: 7.349 — ABNORMAL LOW (ref 7.350–7.450)

## 2015-07-29 LAB — CBC
HCT: 31.3 % — ABNORMAL LOW (ref 39.0–52.0)
Hemoglobin: 9.2 g/dL — ABNORMAL LOW (ref 13.0–17.0)
MCH: 30.9 pg (ref 26.0–34.0)
MCHC: 29.4 g/dL — ABNORMAL LOW (ref 30.0–36.0)
MCV: 105 fL — AB (ref 78.0–100.0)
PLATELETS: 70 10*3/uL — AB (ref 150–400)
RBC: 2.98 MIL/uL — ABNORMAL LOW (ref 4.22–5.81)
RDW: 17 % — AB (ref 11.5–15.5)
WBC: 15.5 10*3/uL — AB (ref 4.0–10.5)

## 2015-07-29 LAB — BASIC METABOLIC PANEL
ANION GAP: 7 (ref 5–15)
ANION GAP: 10 (ref 5–15)
BUN: 68 mg/dL — ABNORMAL HIGH (ref 6–20)
BUN: 65 mg/dL — AB (ref 6–20)
CALCIUM: 8.4 mg/dL — AB (ref 8.9–10.3)
CO2: 44 mmol/L — ABNORMAL HIGH (ref 22–32)
CO2: 42 mmol/L — ABNORMAL HIGH (ref 22–32)
Calcium: 8.7 mg/dL — ABNORMAL LOW (ref 8.9–10.3)
Chloride: 103 mmol/L (ref 101–111)
Chloride: 98 mmol/L — ABNORMAL LOW (ref 101–111)
Creatinine, Ser: 0.94 mg/dL (ref 0.61–1.24)
Creatinine, Ser: 0.78 mg/dL (ref 0.61–1.24)
GLUCOSE: 141 mg/dL — AB (ref 65–99)
Glucose, Bld: 120 mg/dL — ABNORMAL HIGH (ref 65–99)
POTASSIUM: 3.6 mmol/L (ref 3.5–5.1)
Potassium: 3 mmol/L — ABNORMAL LOW (ref 3.5–5.1)
SODIUM: 154 mmol/L — AB (ref 135–145)
SODIUM: 150 mmol/L — AB (ref 135–145)

## 2015-07-29 LAB — GLUCOSE, CAPILLARY
GLUCOSE-CAPILLARY: 129 mg/dL — AB (ref 65–99)
Glucose-Capillary: 112 mg/dL — ABNORMAL HIGH (ref 65–99)
Glucose-Capillary: 135 mg/dL — ABNORMAL HIGH (ref 65–99)
Glucose-Capillary: 139 mg/dL — ABNORMAL HIGH (ref 65–99)
Glucose-Capillary: 185 mg/dL — ABNORMAL HIGH (ref 65–99)

## 2015-07-29 LAB — PROTIME-INR
INR: 1.6 — AB (ref 0.00–1.49)
PROTHROMBIN TIME: 19.1 s — AB (ref 11.6–15.2)

## 2015-07-29 LAB — APTT: APTT: 60 s — AB (ref 24–37)

## 2015-07-29 LAB — PHOSPHORUS: Phosphorus: 2.2 mg/dL — ABNORMAL LOW (ref 2.5–4.6)

## 2015-07-29 LAB — MAGNESIUM: Magnesium: 1.9 mg/dL (ref 1.7–2.4)

## 2015-07-29 LAB — PNEUMOCYSTIS JIROVECI SMEAR BY DFA
Pneumocystis jiroveci Ag: NEGATIVE
Pneumocystis jiroveci Ag: NEGATIVE

## 2015-07-29 MED ORDER — HYDROCORTISONE NA SUCCINATE PF 100 MG IJ SOLR
40.0000 mg | Freq: Every day | INTRAMUSCULAR | Status: DC
  Filled 2015-07-29: qty 1
  Filled 2015-07-29: qty 0.8

## 2015-07-29 MED ORDER — POTASSIUM PHOSPHATES 15 MMOLE/5ML IV SOLN
10.0000 mmol | Freq: Once | INTRAVENOUS | Status: AC
Start: 2015-07-29 — End: 2015-07-29
  Administered 2015-07-29: 10 mmol via INTRAVENOUS
  Filled 2015-07-29: qty 3.33

## 2015-07-29 MED ORDER — POTASSIUM CHLORIDE 20 MEQ/15ML (10%) PO SOLN
40.0000 meq | Freq: Once | ORAL | Status: AC
  Administered 2015-07-29: 40 meq
  Filled 2015-07-29: qty 30

## 2015-07-29 MED ORDER — HYDROCORTISONE NA SUCCINATE PF 100 MG IJ SOLR: 20.0000 mg | Freq: Every day | INTRAMUSCULAR | Status: DC

## 2015-07-29 MED ORDER — POTASSIUM CHLORIDE 10 MEQ/50ML IV SOLN
10.0000 meq | INTRAVENOUS | Status: DC
Start: 2015-07-29 — End: 2015-07-29
  Filled 2015-07-29 (×4): qty 50

## 2015-07-29 MED ORDER — DEXTROSE 5 % IV SOLN
INTRAVENOUS | Status: DC
  Administered 2015-07-29: 40 mL via INTRAVENOUS
  Administered 2015-07-31: 75 mL via INTRAVENOUS
  Administered 2015-07-31: 13:00:00 via INTRAVENOUS

## 2015-07-29 MED ORDER — MAGNESIUM SULFATE 2 GM/50ML IV SOLN
2.0000 g | Freq: Once | INTRAVENOUS | Status: AC
  Administered 2015-07-29: 2 g via INTRAVENOUS
  Filled 2015-07-29: qty 50

## 2015-07-29 MED ORDER — LIDOCAINE HCL (PF) 1 % IJ SOLN
INTRAMUSCULAR | Status: AC
  Filled 2015-07-29: qty 5

## 2015-07-29 MED ORDER — VITAL AF 1.2 CAL PO LIQD
1000.0000 mL | ORAL | Status: DC
  Administered 2015-07-29 – 2015-07-31 (×3): 1000 mL
  Filled 2015-07-29: qty 1000

## 2015-07-29 MED ORDER — HYDROCORTISONE NA SUCCINATE PF 100 MG IJ SOLR
20.0000 mg | Freq: Once | INTRAMUSCULAR | Status: AC
Start: 2015-07-29 — End: 2015-07-29
  Administered 2015-07-29: 20 mg via INTRAVENOUS
  Filled 2015-07-29: qty 2

## 2015-07-29 NOTE — Consult Note (Signed)
WOC wound consult note Reason for Consult: trach site skin breakdown Wound type: HAMDRPI (hospital acquired medical device related pressure injury_ Pressure Ulcer POA: No Measurement: unable to assess today due to bleeding  Wound bed: unable to assess today due to bleeding  Drainage (amount, consistency, odor) has recently have some constant bleeding from the site, now has been controlled but patient has very fragile, friable skin Periwound: oozing, hematoma  Dressing procedure/placement/frequency: Bedside nursing implemented silicone foam which would have been my dressing of choice, soft and protects skin from further injury. Added additional foam to the upper chin where he has an additional abrasion noted.  His skin is so friable right now that I do not think it is advisable to remove the dressings.  I will ask that the dressing remain in place for at least the next 2 days to see if the oozing will stop.   WOC will follow up closely in the next 2 days to reassess.    Helane Briceno [place] RN,CWOCN [phone]

## 2015-07-29 NOTE — Progress Notes (Addendum)
PULMONARY / CRITICAL CARE MEDICINE   Name: Lee Klein MRN: [id number] DOB: 1951/05/17    ADMISSION DATE:  07/02/2015 CONSULTATION DATE: 07/09/15  REFERRING MD: Dr. Oswaldo Done / Internal Medicine  CHIEF COMPLAINT: Abdominal pain and vomiting   BRIEF PATIENT DESCRIPTION : The patient was admitted for acute on chronic nausea and vomiting. He has Crohn's disease. Also with significant comorbidities. 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 He was treated as drug-induced pancreatitis. GI and ID have been following. He had lactic acidosis. Had fluid resuscitation. Had resp distress on 2/21. Chest x-ray with bilateral infiltrates, new from admission. He went into respiratory distress. O2 sats 60% on non breather mask. Transferred to ICU. ABG prior to transfer was 7.26, PCO2 33, PO2 48 on 100% nonrebreather mask. He received 80 mg IV Lasix. He was intubated on 2/28 for prorgressive respiratory failure.  SUBJECTIVE:  crepitus worse PTX now left   VITAL SIGNS: BP 128/73 mmHg  Pulse 98  Temp(Src) 97.8 F (36.6 C) (Axillary)  Resp 34  Ht 5\' 10"  (1.778 m)  Wt 63.8 kg (140 lb 10.5 oz)  BMI 20.18 kg/m2  SpO2 97%   PHYSICAL EXAMINATION:  General: sedated on vent HENT: NCAT, Trach midline, dsg scant dry blood, creptitis neck uppe rleft chest new PULM: coarse, reduced CV: tachy , no m/r/g GI: ostomy in place Derm: some arm edema, bruising throughout extensor surfaces Neuro: sedated rass -3, responds to pain  HEMODYNAMICS: CVP:  [3 mmHg-4 mmHg] 3 mmHg  VENTILATOR SETTINGS: Vent Mode:  [-] PRVC FiO2 (%):  [40 %-50 %] 40 % Set Rate:  [34 bmp] 34 bmp Vt Set:  [400 mL] 400 mL PEEP:  [8 cmH20] 8 cmH20 Plateau Pressure:  [31 cmH20-34 cmH20] 32 cmH20  INTAKE / OUTPUT: I/O last 3 completed shifts: In: 7059.5 [I.V.:854.1; Other:80; NG/GT:3775; IV Piggyback:2350.4] Out: 4245 [Urine:3195; Stool:725; Chest Tube:325]   LABS:  BMET  Recent  Labs Lab 07/27/15 0533 07/28/15 0716 07/29/15 0437  NA 157* 158* 154*  K 2.4* 2.9* 3.0*  CL 104 98* 103  CO2 44* 47* 44*  BUN 61* 64* 68*  CREATININE 1.14 1.01 0.94  GLUCOSE 137* 124* 141*   Electrolytes  Recent Labs Lab 07/27/15 0533 07/28/15 0716 07/29/15 0437  CALCIUM 9.4 9.0 8.4*  MG 2.3 2.0 1.9  PHOS 2.5 1.5* 2.2*   CBC  Recent Labs Lab 07/27/15 0533 07/28/15 0716 07/29/15 0437  WBC 14.9* 16.6* 15.5*  HGB 9.0* 9.5* 9.2*  HCT 29.5* 32.1* 31.3*  PLT 82* 82* 70*   Coag's  Recent Labs Lab 08/16/2015 2045  APTT 30  INR 1.56*   Sepsis Markers No results for input(s): LATICACIDVEN, PROCALCITON, O2SATVEN in the last 168 hours.  ABG  Recent Labs Lab 07/28/15 0410 07/29/15 0400 07/29/15 0413  PHART 7.465* 7.349* 7.373  PCO2ART 66.8* 81.1* 76.8*  PO2ART 86.1 68.1* 66.0*   Liver Enzymes  Recent Labs Lab 08/16/2015 0306 07/25/15 0415  AST 30 32  ALT 37 36  ALKPHOS 146* 111  BILITOT 0.9 0.9  ALBUMIN 1.9* 3.0*   Cardiac Enzymes  Recent Labs Lab 08/07/2015 0640 08/03/2015 1234 08/09/2015 1905  TROPONINI <0.03 0.03 <0.03    Glucose  Recent Labs Lab 07/28/15 0911 07/28/15 1256 07/28/15 1532 07/28/15 2015 07/29/15 0009 07/29/15 0406  GLUCAP 159* 147* 138* 131* 139* 129*    Imaging Dg Chest Port 1 View  07/29/2015  ADDENDUM REPORT: 07/29/2015 08:04 ADDENDUM: The original report was by Dr.  Gaylyn Rong. The following addendum is by Dr. Gaylyn Rong: Critical Value/emergent results were called by telephone at the time of interpretation on 07/29/2015 at 7:47 am to Dr. Micah Flesher, who verbally acknowledged these results. Electronically Signed   By: Gaylyn Rong M.D.   On: 07/29/2015 08:04  07/29/2015  CLINICAL DATA:  Respiratory failure. EXAM: PORTABLE CHEST 1 VIEW COMPARISON:  07/28/2015 FINDINGS: Tracheostomy tube projects over the tracheal air shadow. Feeding tube extends down into the stomach. Left IJ line tip: brachiocephalic  confluence. Right-sided PICC line tip: Right mediastinal margin, probably in the SVC given the degree of rightward rotation. Right-sided chest tube in place, with subcutaneous emphysema tracking from the right neck down along the right chest wall. Suspected 15% right apical pneumothorax. Bilateral airspace opacities minimally worse on the left side compared to prior. Subcutaneous emphysema on the left. Possible 15% left pneumothorax. IMPRESSION: 1. Stable 15% right apical pneumothorax with right-sided chest tube in place. 2. Subcutaneous emphysema on both the right and the left sides, new low on the left, with questionable 15% left apical pneumothorax. This could be confirmed with CT if clinically warranted. 3. Mildly increased airspace opacity on the left. Electronically Signed: By: Gaylyn Rong M.D. On: 07/29/2015 07:29   SIGNIFICANT EVENTS:  06/20/2015 admitted for nausea vomiting, possible drug-induced pancreatitis 07/09/15 went into resp distress: O2 sats 60% on NRBM; transferred to ICU  07/12/15 marked fluid overload unable to wean off bipap 07/16/15 Intubated 3/7 attempted to get off paralytics with significant desaturation and asynchrony 3/11 Right sided PTX>Chest tube  3/13- left PTX, chest tube placed   STUDIES : [name] Abd (2/17) >> Heterogeneous echogenicity of pancreas which could due pancreatic inflammation; small ascites; fatty liver 2D echo (05/2014) >> EF 50%, CHFpEF CT Abd/pelvis - negative for pancreatic necrosis or abscess or complication related to Crohn's disease. New moderate bilateral pleural effusions. New moderate volume of abdominal ascites. 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  Echo 2/26 - normal EF, G1DD  IMAGING :   CULTURES: Blood culture (2/21) >> GNR, Serratia BCx (2/22) >> NGTD  ANTIBIOTICS :  Clofazimine 2/17 >> 2/21 Azithromycin 2/17 >> 2/21 Tigecycline 2/17 -2/20 Vanc 2/21>> 2/22 Meropenem 2/21>>2/24>>>3/7>>> Ceftazidine 2/24>>  2/25 Ceftriaxone 2/25 >> 3/7 Acyclovir 3/8 >>  LINES:  R PICC --removed PICC 3 lumens 3/6>>> LIJ 2/21>> 2/27 L PICC 2/27 > 3/6 Right ileostomy  ETT 2/28>>>3/10 R IJ TLC 3/6>>>3/8 L IJ TLC 3/8>>> Trach 3/10  R Chest tube 3/11  >>  DISCUSSION: 65 y/o male with Crohn's disease, long standing protein calorie malnutrition, ostomy who was admitted on 2/21 with acute pancreatitis and has struggled with acute respiratory failure with hypoxemia likely due to ARDS and pulmonary edema. Intubated 2/28.  ASSESSMENT/PLAN:  PULMONARY A:  Acute hypoxemic respiratory failure secondary: Pulmonary edema, ARDS ?aspiration. ARDS Right PTX  left PTX P: Continue full vent support.  Xopenex nebulized q8h Place chest tube to suction increase to 40 rt given remaingin PTX Place Chest tube left Decrease PEEP to 3, with PTX noted After chest tube, consider SBT Keep same MV Increase O2 for now on vent prior to chest tube placement pcxr to follow Slight neg balance goals  CARDIOVASCULAR A:  Diastolic dysfunction based on 3435/6861 echo, EF nml. Not known to have CAD HTN   P:  Acetazolamide dc metoprolol PO  RENAL A:  Acute kidney injury Hypernatremia  Hypokalemia  P:  Free water Add d5w 40 cc/hr bmet q12h  k supp  GASTROINTESTINAL  A:  Crohn's disease requiring chronic steroids Drug-induced pancreatitis (Tigecycline) - Lipase improved Chronic nausea, vomiting History of antibiotic related diarrhea Severe malnutrition P:  Continue TF Continue stress ulcer prophylaxis. No TPN as able  HEMATOLOGIC A:  Thrombocytopenia, chronic, stable H/O left lower extremity DVT in October 2016. By history, unprovoked. He was on Xarelto outpatient. 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 Abx for 1 yr. Serratia bacteremia -PICC on admission likely source Fungal rash groin P:  Per ID:  Continue merem stop on 3/13. Clotrimazole cream groin Acyclovir 3/8>>>3/12.  ENDOCRINE A: Chronic adrenal insufficiency  P:  Cont hydrocortisone to 20 mg IV BID to home regimen 20 daily Monitor sugars SSI  NEUROLOGIC A: Anxiety, chronic Sedation needs for vent synchrony P:  RASS goal: -1 Sedation fentanyl and versed drips, after chest tube, re evalaute needs  Ccm time 35 min   Mcarthur Rossetti. Tyson Alias, MD, FACP Pgr: [phone] [facility]   I have had extensive discussions with family wife. We discussed patients current circumstances and organ failures. We also discussed patient's prior wishes under circumstances such as this. Family has decided to NOT perform resuscitation if arrest but to continue current medical support for now.

## 2015-07-29 NOTE — Progress Notes (Signed)
UR Completed. Estellar Cadena, RN, BSN.  [phone] 

## 2015-07-29 NOTE — Progress Notes (Signed)
eLink Physician-Brief Progress Note Patient Name: Lee Klein DOB: 1951-04-29 MRN: [id number]    eLink Physician Progress Note and Electrolyte Replacement  Patient Name: Lee Klein DOB: 1950-10-11 MRN: [id number]  Date of Service  07/29/2015   HPI/Events of Note    Recent Labs Lab 07/25/15 0415 07/26/15 0517 07/27/15 0533 07/28/15 0716 07/29/15 0437  NA 150* 154* 157* 158* 154*  K 3.1* 3.0* 2.4* 2.9* 3.0*  CL 108 110 104 98* 103  CO2 31 36* 44* 47* 44*  GLUCOSE 113* 110* 137* 124* 141*  BUN 54* 64* 61* 64* 68*  CREATININE 1.46* 1.36* 1.14 1.01 0.94  CALCIUM 8.3* 8.7* 9.4 9.0 8.4*  MG 2.1 1.9 2.3 2.0 1.9  PHOS 5.3* 3.6 2.5 1.5* 2.2*    Estimated Creatinine Clearance: 73.5 mL/min (by C-G formula based on Cr of 0.94).  Intake/Output      03/12 0701 - 03/13 0700   I.V. (mL/kg) 347.5 (5.4)   Other 70   NG/GT 2655   IV Piggyback 1829.2   Total Intake(mL/kg) 4901.7 (76.8)   Urine (mL/kg/hr) 2005 (1.3)   Stool 625 (0.4)   Chest Tube 215 (0.1)   Total Output 2845   Net +2056.7        - I/O DETAILED x 24h    Total I/O In: 2800.3 [I.V.:95.7; Other:65; NG/GT:1275; IV Piggyback:1364.6] Out: 1325 [Urine:1045; Stool:200; Chest Tube:80] - I/O THIS SHIFT    ASSESSMENT Hypokalemia Hypomagnesemia Hypophosphatemia   eICURN Interventions  Replete all 3   ASSESSMENT: MAJOR ELECTROLYTE      Dr. Kalman Shan, M.D., [facility].C.P Pulmonary and Critical Care Medicine Staff Physician [facility] [place] [facility] Pager: [phone], If no answer or between  15:00h - 7:00h: call [phone]  07/29/2015 5:34 AM      Intervention Category Major Interventions: Electrolyte abnormality - evaluation and management  Lezlie Ritchey 07/29/2015, 5:34 AM

## 2015-07-29 NOTE — Procedures (Signed)
Left chest tube  Indication: increased crepitous, PTX on pcxr, tachy, emergent  Pre op DX; left ptx Post op : left PTX  chloraprpep to 6th ICS Lido 10 cc 1% injected 1.2 cm vertical incision Dissection to space through muslce Through with closed kelly huige gush air out Adhesions lossed off wall with finger Placed 28 fr ct at 14 cm easily Tidal WNL Blood loss less 1 cc  Mcarthur Rossetti. Tyson Alias, MD, FACP Pgr: [phone] [place] [facility]   Updated wife of needed emergent , she agreed

## 2015-07-30 ENCOUNTER — Inpatient Hospital Stay (HOSPITAL_COMMUNITY): Payer: Managed Care, Other (non HMO)

## 2015-07-30 ENCOUNTER — Ambulatory Visit (INDEPENDENT_AMBULATORY_CARE_PROVIDER_SITE_OTHER): Payer: Managed Care, Other (non HMO) | Admitting: Internal Medicine

## 2015-07-30 LAB — BASIC METABOLIC PANEL
ANION GAP: 6 (ref 5–15)
BUN: 60 mg/dL — ABNORMAL HIGH (ref 6–20)
CALCIUM: 8.2 mg/dL — AB (ref 8.9–10.3)
CO2: 38 mmol/L — ABNORMAL HIGH (ref 22–32)
CREATININE: 0.68 mg/dL (ref 0.61–1.24)
Chloride: 103 mmol/L (ref 101–111)
Glucose, Bld: 123 mg/dL — ABNORMAL HIGH (ref 65–99)
Potassium: 5.3 mmol/L — ABNORMAL HIGH (ref 3.5–5.1)
SODIUM: 147 mmol/L — AB (ref 135–145)

## 2015-07-30 LAB — COMPREHENSIVE METABOLIC PANEL
ALBUMIN: 2.3 g/dL — AB (ref 3.5–5.0)
ALK PHOS: 111 U/L (ref 38–126)
ALT: 36 U/L (ref 17–63)
ANION GAP: 8 (ref 5–15)
AST: 41 U/L (ref 15–41)
BILIRUBIN TOTAL: 1.4 mg/dL — AB (ref 0.3–1.2)
BUN: 62 mg/dL — ABNORMAL HIGH (ref 6–20)
CALCIUM: 8.5 mg/dL — AB (ref 8.9–10.3)
CO2: 41 mmol/L — AB (ref 22–32)
CREATININE: 0.69 mg/dL (ref 0.61–1.24)
Chloride: 101 mmol/L (ref 101–111)
GFR calc non Af Amer: >60 mL/min (ref >60)
GLUCOSE: 130 mg/dL — AB (ref 65–99)
Potassium: 3.2 mmol/L — ABNORMAL LOW (ref 3.5–5.1)
Sodium: 150 mmol/L — ABNORMAL HIGH (ref 135–145)
TOTAL PROTEIN: 4.1 g/dL — AB (ref 6.5–8.1)

## 2015-07-30 LAB — POCT I-STAT 3, ART BLOOD GAS (G3+)
Acid-Base Excess: 18 mmol/L — ABNORMAL HIGH (ref 0.0–2.0)
BICARBONATE: 44.3 meq/L — AB (ref 20.0–24.0)
O2 Saturation: 96 %
PCO2 ART: 67.6 mmHg — AB (ref 35.0–45.0)
PO2 ART: 83 mmHg (ref 80.0–100.0)
TCO2: 46 mmol/L (ref 0–100)
pH, Arterial: 7.423 (ref 7.350–7.450)

## 2015-07-30 LAB — GLUCOSE, CAPILLARY
GLUCOSE-CAPILLARY: 125 mg/dL — AB (ref 65–99)
GLUCOSE-CAPILLARY: 134 mg/dL — AB (ref 65–99)
GLUCOSE-CAPILLARY: 136 mg/dL — AB (ref 65–99)
GLUCOSE-CAPILLARY: 138 mg/dL — AB (ref 65–99)
Glucose-Capillary: 112 mg/dL — ABNORMAL HIGH (ref 65–99)
Glucose-Capillary: 132 mg/dL — ABNORMAL HIGH (ref 65–99)

## 2015-07-30 LAB — CBC WITH DIFFERENTIAL/PLATELET
BASOS ABS: 0 10*3/uL (ref 0.0–0.1)
BASOS PCT: 0 %
EOS ABS: 0.2 10*3/uL (ref 0.0–0.7)
Eosinophils Relative: 1 %
HCT: 29.5 % — ABNORMAL LOW (ref 39.0–52.0)
Hemoglobin: 8.7 g/dL — ABNORMAL LOW (ref 13.0–17.0)
Lymphocytes Relative: 8 %
Lymphs Abs: 1.1 10*3/uL (ref 0.7–4.0)
MCH: 30.3 pg (ref 26.0–34.0)
MCHC: 29.5 g/dL — AB (ref 30.0–36.0)
MCV: 102.8 fL — ABNORMAL HIGH (ref 78.0–100.0)
MONO ABS: 1.1 10*3/uL — AB (ref 0.1–1.0)
MONOS PCT: 8 %
NEUTROS ABS: 11.7 10*3/uL — AB (ref 1.7–7.7)
Neutrophils Relative %: 83 %
PLATELETS: 63 10*3/uL — AB (ref 150–400)
RBC: 2.87 MIL/uL — ABNORMAL LOW (ref 4.22–5.81)
RDW: 16.2 % — AB (ref 11.5–15.5)
WBC: 14.2 10*3/uL — ABNORMAL HIGH (ref 4.0–10.5)

## 2015-07-30 LAB — FIBRINOGEN: Fibrinogen: 280 mg/dL (ref 204–475)

## 2015-07-30 MED ORDER — FENTANYL CITRATE (PF) 100 MCG/2ML IJ SOLN
50.0000 ug | INTRAMUSCULAR | Status: DC
  Administered 2015-07-30 – 2015-07-31 (×5): 50 ug via INTRAVENOUS
  Filled 2015-07-30 (×3): qty 2

## 2015-07-30 MED ORDER — POTASSIUM CHLORIDE 20 MEQ/15ML (10%) PO SOLN
40.0000 meq | ORAL | Status: AC
  Administered 2015-07-30 (×3): 40 meq
  Filled 2015-07-30 (×3): qty 30

## 2015-07-30 MED ORDER — LORAZEPAM 2 MG/ML IJ SOLN
1.0000 mg | Freq: Two times a day (BID) | INTRAMUSCULAR | Status: DC
  Administered 2015-07-30 – 2015-07-31 (×3): 1 mg via INTRAVENOUS
  Filled 2015-07-30 (×3): qty 1

## 2015-07-30 MED ORDER — ADULT MULTIVITAMIN W/MINERALS CH
1.0000 | ORAL_TABLET | Freq: Every day | ORAL | Status: DC
  Administered 2015-07-30 – 2015-07-31 (×2): 1
  Filled 2015-07-30 (×3): qty 1

## 2015-07-30 MED ORDER — PREDNISONE 10 MG PO TABS
10.0000 mg | ORAL_TABLET | Freq: Every day | ORAL | Status: DC
  Administered 2015-07-30 – 2015-07-31 (×2): 10 mg
  Filled 2015-07-30 (×3): qty 1

## 2015-07-30 NOTE — Progress Notes (Signed)
PULMONARY / CRITICAL CARE MEDICINE   Name: Lee Klein MRN: [id number] DOB: July 10, 1950    ADMISSION DATE:  07/13/2015 CONSULTATION DATE: 07/09/15  REFERRING MD: Dr. Oswaldo Done / Internal Medicine  CHIEF COMPLAINT: Abdominal pain and vomiting   BRIEF PATIENT DESCRIPTION : The patient was admitted for acute on chronic nausea and vomiting. He has Crohn's disease. Also with significant comorbidities. 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 He was treated as drug-induced pancreatitis. GI and ID have been following. He had lactic acidosis. Had fluid resuscitation. Had resp distress on 2/21. Chest x-ray with bilateral infiltrates, new from admission. He went into respiratory distress. O2 sats 60% on non breather mask. Transferred to ICU. ABG prior to transfer was 7.26, PCO2 33, PO2 48 on 100% nonrebreather mask. He received 80 mg IV Lasix. He was intubated on 2/28 for prorgressive respiratory failure.  SUBJECTIVE:  Low K  Less responsive to Rn this am   VITAL SIGNS: BP 112/63 mmHg  Pulse 107  Temp(Src) 98.7 F (37.1 C) (Oral)  Resp 35  Ht 5\' 10"  (1.778 m)  Wt 65.8 kg (145 lb 1 oz)  BMI 20.81 kg/m2  SpO2 98%   PHYSICAL EXAMINATION:  General: less responsive HENT: NCAT, Trach midline, dsg scant dry blood, creptitis left chest  PULM: coarse, reduced CV: s1 s2 mild tachy , no m/r/g GI: ostomy in place Derm: some arm edema, bruising throughout extensor surfaces Neuro: sedated rass -4, pup left 4, rt 3, reactive, no movement to pain, cough present  HEMODYNAMICS:    VENTILATOR SETTINGS: Vent Mode:  [-] PRVC FiO2 (%):  [40 %-50 %] 50 % Set Rate:  [34 bmp] 34 bmp Vt Set:  [400 mL] 400 mL PEEP:  [5 cmH20] 5 cmH20 Plateau Pressure:  [26 cmH20-39 cmH20] 39 cmH20  INTAKE / OUTPUT: I/O last 3 completed shifts: In: 8243.6 [I.V.:1207; Other:75; NG/GT:5305; IV Piggyback:1656.6] Out: 4615 [Urine:2835; Stool:1200; Chest  Tube:580]   LABS:  BMET  Recent Labs Lab 07/29/15 0437 07/29/15 2047 07/30/15 0506  NA 154* 150* 150*  K 3.0* 3.6 3.2*  CL 103 98* 101  CO2 44* 42* 41*  BUN 68* 65* 62*  CREATININE 0.94 0.78 0.69  GLUCOSE 141* 120* 130*   Electrolytes  Recent Labs Lab 07/27/15 0533 07/28/15 0716 07/29/15 0437 07/29/15 2047 07/30/15 0506  CALCIUM 9.4 9.0 8.4* 8.7* 8.5*  MG 2.3 2.0 1.9  --   --   PHOS 2.5 1.5* 2.2*  --   --    CBC  Recent Labs Lab 07/28/15 0716 07/29/15 0437 07/30/15 0506  WBC 16.6* 15.5* 14.2*  HGB 9.5* 9.2* 8.7*  HCT 32.1* 31.3* 29.5*  PLT 82* 70* 63*   Coag's  Recent Labs Lab 08/11/2015 2045 07/29/15 1114  APTT 30 60*  INR 1.56* 1.60*   Sepsis Markers No results for input(s): LATICACIDVEN, PROCALCITON, O2SATVEN in the last 168 hours.  ABG  Recent Labs Lab 07/29/15 0400 07/29/15 0413 07/29/15 1416  PHART 7.349* 7.373 7.341*  PCO2ART 81.1* 76.8* 81.4*  PO2ART 68.1* 66.0* 89.0   Liver Enzymes  Recent Labs Lab 07/25/15 0415 07/30/15 0506  AST 32 41  ALT 36 36  ALKPHOS 111 111  BILITOT 0.9 1.4*  ALBUMIN 3.0* 2.3*   Cardiac Enzymes  Recent Labs Lab 08/14/2015 1234 08/01/2015 1905  TROPONINI 0.03 <0.03    Glucose  Recent Labs Lab 07/29/15 0406 07/29/15 1225 07/29/15 1526 07/29/15 1947 07/30/15 0006 07/30/15 0318  GLUCAP 129* 185*  135* 112* 125* 112*    Imaging Dg Chest Port 1 View  07/30/2015  CLINICAL DATA:  Shortness of breath. EXAM: PORTABLE CHEST 1 VIEW COMPARISON:  July 29, 2015 FINDINGS: Bilateral chest tubes remain. Tracheostomy catheter tip is 5.6 cm above the carina. Feeding tube tip is below the diaphragm. Right-sided central catheter tip is in the superior vena cava. Left jugular catheter tip is most likely at the junction of the left jugular and innominate veins. Small apical pneumothoraces appear essentially stable compared to 1 day prior. The pneumothorax on the right does extend along the superolateral aspect  of the right hemithorax, stable. There is soft tissue air bilaterally, marginally less compared to 1 day prior. There is interstitial edema bilaterally, stable. Patchy airspace opacity in the right base is stable. There is no new edema or airspace opacity. The heart is borderline enlarged with pulmonary vascularity within normal limits. No adenopathy evident. IMPRESSION: Small apical pneumothoraces persist without change. No change in chest tube positions. Other tubes and catheters as described. There is interstitial edema with patchy alveolar opacity in the right base, likely edema, although superimposed pneumonia cannot be excluded. No change in cardiac silhouette. Slightly less subcutaneous air is noted compared to 1 day prior. Electronically Signed   By: Bretta Bang III M.D.   On: 07/30/2015 07:21   Dg Chest Port 1 View  07/29/2015  CLINICAL DATA:  Bilateral pneumothoraces. New left chest tube placement. EXAM: PORTABLE CHEST 1 VIEW 9:09 a.m. COMPARISON:  07/29/2015 at 4:07 a.m. and 07/28/2015 FINDINGS: There is increased subcutaneous emphysema on the left. The left-sided chest tube in good position. Diminished small left apical pneumothorax. Right-sided chest tube remains in good position. Slight decrease in right apical pneumothorax. Feeding tube below the diaphragm. Left jugular vein catheter tip at the junction of the superior vena cava and left innominate vein. Tracheostomy tube in good position. Increased hazy infiltrate throughout the right lung. Left midzone infiltrate unchanged. Heart size and vascularity are normal. IMPRESSION: 1. Slight decrease in bilateral apical pneumothoraces. Chest tubes in good position. 2. Increasing diffuse hazy infiltrate in the right lung. 3. Stable hazy infiltrate in the left lung. Electronically Signed   By: Francene Boyers M.D.   On: 07/29/2015 09:47   SIGNIFICANT EVENTS:  06/20/2015 admitted for nausea vomiting, possible drug-induced pancreatitis 07/09/15 went  into resp distress: O2 sats 60% on NRBM; transferred to ICU  07/12/15 marked fluid overload unable to wean off bipap 07/16/15 Intubated 3/7 attempted to get off paralytics with significant desaturation and asynchrony 3/11 Right sided PTX>Chest tube  3/13- left PTX, chest tube placed  3/14- less responsove  STUDIES : [name] Abd (2/17) >> Heterogeneous echogenicity of pancreas which could due pancreatic inflammation; small ascites; fatty liver 2D echo (05/2014) >> EF 50%, CHFpEF CT Abd/pelvis - negative for pancreatic necrosis or abscess or complication related to Crohn's disease. New moderate bilateral pleural effusions. New moderate volume of abdominal ascites. 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  Echo 2/26 - normal EF, G1DD  IMAGING :   CULTURES: Blood culture (2/21) >> GNR, Serratia BCx (2/22) >> NGTD  ANTIBIOTICS :  Clofazimine 2/17 >> 2/21 Azithromycin 2/17 >> 2/21 Tigecycline 2/17 -2/20 Vanc 2/21>> 2/22 Meropenem 2/21>>2/24>>>3/7>>>stop date 3/13 Ceftazidine 2/24>> 2/25 Ceftriaxone 2/25 >> 3/7 Acyclovir 3/8 >>>off  LINES:  R PICC --removed PICC 3 lumens 3/6>>> LIJ 2/21>> 2/27 L PICC 2/27 > 3/6 Right ileostomy  ETT 2/28>>>3/10 R IJ TLC 3/6>>>3/8 L IJ TLC 3/8>>> Trach 3/10  R Chest tube 3/11  >> Left chest tube 3/13>>>  DISCUSSION: 65 y/o male with Crohn's disease, long standing protein calorie malnutrition, ostomy who was admitted on 2/21 with acute pancreatitis and has struggled with acute respiratory failure with hypoxemia likely due to ARDS and pulmonary edema. Intubated 2/28.  ASSESSMENT/PLAN:  PULMONARY A:  Acute hypoxemic respiratory failure secondary: Pulmonary edema, ARDS ?aspiration. ARDS Right PTX  left PTX P: No leaks on cT, ptx noted, keep both at suction for now at 20  Requires pcxr follow up in am  Get abg with current neurostatus after abg, consider SBT attempt, cpap 5 ps5, goal 2 hrs  CARDIOVASCULAR A:  Diastolic  dysfunction based on 2016/2017 echo, EF nml. Not known to have CAD HTN   P:  metoprolol PO, tolerating  RENAL A:  Acute kidney injury Hypernatremia  Hypokalemia  P:  Free water keep Add d5w 40 cc/hr, increase 75 for na correction further with neurostatus bmet q12h  k supp Likely for lasix soon for lungs  GASTROINTESTINAL A:  Crohn's disease requiring chronic steroids Drug-induced pancreatitis (Tigecycline) - Lipase improved Chronic nausea, vomiting History of antibiotic related diarrhea Severe malnutrition P:  Continue TF Continue stress ulcer prophylaxis.  HEMATOLOGIC A:  Thrombocytopenia, chronic, stable, dilution H/O left lower extremity DVT in October 2016. By history, unprovoked. He was on Xarelto outpatient. 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 Abx for 1 yr. Serratia bacteremia -PICC on admission likely source Fungal rash groin P:  Clotrimazole cream groin  ENDOCRINE A: Chronic adrenal insufficiency  P:  Cont hydrocortisone to pred Monitor sugars SSI as on d5  NEUROLOGIC A: Anxiety, chronic Sedation needs for vent synchrony Less responsive, encephalaopthy P:  RASS goal: -1 Dc all sedation and observe for now May need CT head Ensure not stop all benzo , narcs as likely to WD Correct Na  Ccm time 35 min   Mcarthur Rossetti. Tyson Alias, MD, FACP Pgr: [phone] [place] [facility]

## 2015-07-30 NOTE — Progress Notes (Signed)
Patient being transported to MRI

## 2015-07-30 NOTE — Progress Notes (Signed)
eLink Physician-Brief Progress Note Patient Name: EASTIN WHITELOW DOB: 25-Jun-1950 MRN: [id number]   Date of Service  07/30/2015  HPI/Events of Note  K+ = 5.3 and Creatinine = 0.68.  eICU Interventions  K+ should drift down given good renal function.      Intervention Category Major Interventions: Electrolyte abnormality - evaluation and management  Shyia Fillingim Eugene 07/30/2015, 10:36 PM

## 2015-07-30 NOTE — Progress Notes (Signed)
eLink Physician-Brief Progress Note Patient Name: Lee Klein DOB: [birth year] MRN: [id number]   Date of Service  07/30/2015  HPI/Events of Note  Family request Ammonia level drawn.   eICU Interventions  Will order Ammonia level.      Intervention Category Intermediate Interventions: Other:  Lenell Antu 07/30/2015, 9:51 PM

## 2015-07-30 NOTE — Care Management (Addendum)
Contacted [employer] per request regarding pt status; left voicemail.  Liaison returned voicemail - needs met.

## 2015-07-30 NOTE — Progress Notes (Signed)
CT scheduled for 5:30pm

## 2015-07-30 NOTE — Progress Notes (Signed)
Patient transported to CT and back to room 2M04 without any apparent complications. RT will continue to monitor.

## 2015-07-31 ENCOUNTER — Encounter (HOSPITAL_COMMUNITY): Payer: Self-pay | Admitting: Surgery

## 2015-07-31 ENCOUNTER — Inpatient Hospital Stay (HOSPITAL_COMMUNITY): Payer: Managed Care, Other (non HMO)

## 2015-07-31 LAB — CBC WITH DIFFERENTIAL/PLATELET
Basophils Absolute: 0 10*3/uL (ref 0.0–0.1)
Basophils Relative: 0 %
EOS ABS: 0.3 10*3/uL (ref 0.0–0.7)
EOS PCT: 2 %
HCT: 28.5 % — ABNORMAL LOW (ref 39.0–52.0)
Hemoglobin: 8.6 g/dL — ABNORMAL LOW (ref 13.0–17.0)
LYMPHS ABS: 1 10*3/uL (ref 0.7–4.0)
Lymphocytes Relative: 8 %
MCH: 32.2 pg (ref 26.0–34.0)
MCHC: 30.2 g/dL (ref 30.0–36.0)
MCV: 106.7 fL — ABNORMAL HIGH (ref 78.0–100.0)
MONO ABS: 1.3 10*3/uL — AB (ref 0.1–1.0)
MONOS PCT: 10 %
Neutro Abs: 10.7 10*3/uL — ABNORMAL HIGH (ref 1.7–7.7)
Neutrophils Relative %: 80 %
PLATELETS: 72 10*3/uL — AB (ref 150–400)
RBC: 2.67 MIL/uL — ABNORMAL LOW (ref 4.22–5.81)
RDW: 16.2 % — AB (ref 11.5–15.5)
WBC: 13.3 10*3/uL — ABNORMAL HIGH (ref 4.0–10.5)

## 2015-07-31 LAB — GLUCOSE, CAPILLARY
GLUCOSE-CAPILLARY: 115 mg/dL — AB (ref 65–99)
GLUCOSE-CAPILLARY: 142 mg/dL — AB (ref 65–99)
Glucose-Capillary: 111 mg/dL — ABNORMAL HIGH (ref 65–99)
Glucose-Capillary: 106 mg/dL — ABNORMAL HIGH (ref 65–99)

## 2015-07-31 LAB — BASIC METABOLIC PANEL
Anion gap: 7 (ref 5–15)
BUN: 65 mg/dL — AB (ref 6–20)
CHLORIDE: 98 mmol/L — AB (ref 101–111)
CO2: 40 mmol/L — ABNORMAL HIGH (ref 22–32)
CREATININE: 0.77 mg/dL (ref 0.61–1.24)
Calcium: 8.4 mg/dL — ABNORMAL LOW (ref 8.9–10.3)
GFR calc Af Amer: >60 mL/min (ref >60)
GFR calc non Af Amer: >60 mL/min (ref >60)
Glucose, Bld: 123 mg/dL — ABNORMAL HIGH (ref 65–99)
Potassium: 5 mmol/L (ref 3.5–5.1)
SODIUM: 145 mmol/L (ref 135–145)

## 2015-07-31 LAB — AMMONIA: Ammonia: 18 umol/L (ref 9–35)

## 2015-07-31 MED ORDER — HYDROCORTISONE NA SUCCINATE PF 100 MG IJ SOLR
50.0000 mg | Freq: Four times a day (QID) | INTRAMUSCULAR | Status: DC
  Filled 2015-07-31 (×3): qty 1

## 2015-07-31 MED ORDER — MORPHINE BOLUS VIA INFUSION
5.0000 mg | INTRAVENOUS | Status: DC | PRN
  Filled 2015-07-31: qty 20

## 2015-07-31 MED ORDER — LORAZEPAM 2 MG/ML IJ SOLN
5.0000 mg/h | INTRAVENOUS | Status: DC
  Administered 2015-07-31: 5 mg/h via INTRAVENOUS
  Filled 2015-07-31: qty 25

## 2015-07-31 MED ORDER — MORPHINE SULFATE 25 MG/ML IV SOLN
10.0000 mg/h | INTRAVENOUS | Status: DC
  Administered 2015-07-31: 2 mg/h via INTRAVENOUS
  Administered 2015-07-31 (×2): 5 mg/h via INTRAVENOUS
  Administered 2015-07-31: 2 mg/h via INTRAVENOUS
  Administered 2015-07-31: 5 mg/h via INTRAVENOUS
  Filled 2015-07-31: qty 10

## 2015-07-31 MED ORDER — PRO-STAT SUGAR FREE PO LIQD
30.0000 mL | Freq: Every day | ORAL | Status: DC
  Filled 2015-07-31: qty 30

## 2015-07-31 MED ORDER — LEVALBUTEROL HCL 0.63 MG/3ML IN NEBU: 0.6300 mg | INHALATION_SOLUTION | Freq: Four times a day (QID) | RESPIRATORY_TRACT | Status: DC | PRN

## 2015-07-31 MED ORDER — LORAZEPAM BOLUS VIA INFUSION
2.0000 mg | INTRAVENOUS | Status: DC | PRN
  Filled 2015-07-31: qty 5

## 2015-07-31 MED ORDER — SODIUM CHLORIDE 0.9 % IV BOLUS (SEPSIS)
500.0000 mL | Freq: Once | INTRAVENOUS | Status: AC
  Administered 2015-07-31: 500 mL via INTRAVENOUS

## 2015-08-01 ENCOUNTER — Ambulatory Visit: Payer: Managed Care, Other (non HMO) | Admitting: Infectious Disease

## 2015-08-02 ENCOUNTER — Telehealth: Payer: Self-pay

## 2015-08-02 NOTE — Telephone Encounter (Signed)
On 08-07-2015 I received a death certificate from [facility] (faxed). The death certificate is for cremation. The patient is a patient of Doctor Tyson Alias. The death certificate will be taken to [facility] ([id number]) this am for signature. On 08/07/15 I received the death certificate back from Doctor Tyson Alias. I got the death certificate ready and called the funeral home to let them know I was faxing the death certificate to the funeral home per their request.

## 2015-08-05 LAB — LEGIONELLA CULTURE

## 2015-08-06 ENCOUNTER — Telehealth: Payer: Self-pay

## 2015-08-06 LAB — LEGIONELLA CULTURE

## 2015-08-06 NOTE — Telephone Encounter (Signed)
On 08/06/2015 I received a death certificate from [facility] (orginal). The death certificate is for cremation. The patient is a patient of Doctor Tyson Alias. The death certificate will be taken to [facility] ([id number]) next Monday due to the fact that Doctor Tyson Alias is on vacation until then and he signed the faxed copy. On August 21, 2015 I received the death certificate back from Doctor Tyson Alias. I got the death certificate ready and called the funeral home to let them know the death certificate was being mailed to the [facility] per their request.

## 2015-08-13 LAB — LEGIONELLA CULTURE

## 2015-08-17 NOTE — Progress Notes (Addendum)
PULMONARY / CRITICAL CARE MEDICINE   Name: Lee Klein MRN: [id number] DOB: 1951/05/06    ADMISSION DATE:  06/23/2015 CONSULTATION DATE: 07/09/15  REFERRING MD: Dr. Oswaldo Done / Internal Medicine  CHIEF COMPLAINT: Abdominal pain and vomiting   BRIEF PATIENT DESCRIPTION : The patient was admitted for acute on chronic nausea and vomiting. He has Crohn's disease. Also with significant comorbidities. 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 He was treated as drug-induced pancreatitis. GI and ID have been following. He had lactic acidosis. Had fluid resuscitation. Had resp distress on 2/21. Chest x-ray with bilateral infiltrates, new from admission. He went into respiratory distress. O2 sats 60% on non breather mask. Transferred to ICU. ABG prior to transfer was 7.26, PCO2 33, PO2 48 on 100% nonrebreather mask. He received 80 mg IV Lasix. He was intubated on 2/28 for prorgressive respiratory failure.  SUBJECTIVE:  CT done, neg acute Increased RR  VITAL SIGNS: BP 84/48 mmHg  Pulse 91  Temp(Src) 100 F (37.8 C) (Oral)  Resp 34  Ht  (1.778 m)  Wt 64.3 kg (141 lb 12.1 oz)  BMI 20.34 kg/m2  SpO2 100%   PHYSICAL EXAMINATION:  General: less responsive HENT: NCAT, Trach midline, dsg scant dry blood, creptitis left chest mild PULM: coarse ronchi moderate, reduced rt base CV: s1 s2 mild tachy , no m/r/g GI: ostomy in place Derm: some arm edema same, bruising throughout extensor surfaces Neuro: rass -4, fent  HEMODYNAMICS:    VENTILATOR SETTINGS: Vent Mode:  [-] PRVC FiO2 (%):  [40 %] 40 % Set Rate:  [28 bmp] 28 bmp Vt Set:  [400 mL] 400 mL PEEP:  [5 cmH20] 5 cmH20 Plateau Pressure:  [26 cmH20-31 cmH20] 30 cmH20  INTAKE / OUTPUT: I/O last 3 completed shifts: In: 7681.9 [I.V.:2371.9; NG/GT:5310] Out: 4890 [Urine:1990; Stool:2300; Chest Tube:600]   LABS:  BMET  Recent Labs Lab 07/30/15 0506 07/30/15 2155 08-20-2015 0557  NA  150* 147* 145  K 3.2* 5.3* 5.0  CL 101 103 98*  CO2 41* 38* 40*  BUN 62* 60* 65*  CREATININE 0.69 0.68 0.77  GLUCOSE 130* 123* 123*   Electrolytes  Recent Labs Lab 07/27/15 0533 07/28/15 0716 07/29/15 0437  07/30/15 0506 07/30/15 2155 Aug 20, 2015 0557  CALCIUM 9.4 9.0 8.4*  < > 8.5* 8.2* 8.4*  MG 2.3 2.0 1.9  --   --   --   --   PHOS 2.5 1.5* 2.2*  --   --   --   --   < > = values in this interval not displayed. CBC  Recent Labs Lab 07/29/15 0437 07/30/15 0506 08-20-2015 0557  WBC 15.5* 14.2* 13.3*  HGB 9.2* 8.7* 8.6*  HCT 31.3* 29.5* 28.5*  PLT 70* 63* 72*   Coag's  Recent Labs Lab 07/29/15 1114  APTT 60*  INR 1.60*   Sepsis Markers No results for input(s): LATICACIDVEN, PROCALCITON, O2SATVEN in the last 168 hours.  ABG  Recent Labs Lab 07/29/15 0413 07/29/15 1416 07/30/15 0925  PHART 7.373 7.341* 7.423  PCO2ART 76.8* 81.4* 67.6*  PO2ART 66.0* 89.0 83.0   Liver Enzymes  Recent Labs Lab 07/25/15 0415 07/30/15 0506  AST 32 41  ALT 36 36  ALKPHOS 111 111  BILITOT 0.9 1.4*  ALBUMIN 3.0* 2.3*   Cardiac Enzymes No results for input(s): TROPONINI, PROBNP in the last 168 hours.  Glucose  Recent Labs Lab 07/30/15 0731 07/30/15 1126 07/30/15 1703 08-20-2015 0008 August 20, 2015 0334 20-Aug-2015  0750  GLUCAP 138* 134* 132* 115* 111* 106*    Imaging Ct Head Wo Contrast  07/30/2015  CLINICAL DATA:  Followup of stroke. EXAM: CT HEAD WITHOUT CONTRAST TECHNIQUE: Contiguous axial images were obtained from the base of the skull through the vertex without intravenous contrast. COMPARISON:  None. FINDINGS: Sinuses/Soft tissues: Subcutaneous emphysema about the right-side of the neck, incompletely imaged. Example image 1/series 3. New since 03/07/7. This is likely secondary to a right-sided pneumothorax, when correlated with today's chest radiograph. Mucosal thickening of left maxillary sinus. Right larger than left mastoid effusion. Intracranial: Mild cerebral  atrophy for age. Mild low density in the periventricular white matter likely related to small vessel disease. No cortically based infarct. No hemorrhage, hydrocephalus, intra-axial, or extra-axial fluid collection. IMPRESSION: 1. No evidence of large vessel, cortically based infarct. 2. No complication such as hemorrhage identified. 3.  Cerebral atrophy and small vessel ischemic change. 4. Bilateral mastoid effusions. 5. Subcutaneous emphysema, likely secondary to right-sided pneumothorax, when correlated with chest radiograph. Electronically Signed   By: Jeronimo Greaves M.D.   On: 07/30/2015 18:19   Dg Chest Port 1 View  Aug 19, 2015  CLINICAL DATA:  65 year old male with a history of pneumothorax. EXAM: PORTABLE CHEST 1 VIEW COMPARISON:  Multiple prior comparison, most recent 07/30/2015. FINDINGS: Note that the apices of the chest have been excluded from the exam, somewhat limiting evaluation. Persistence of the bilateral small apical pneumothorax favored. Unchanged position of large bore thoracostomy tube on the right in the left, each terminating at the apex. Unchanged tracheostomy tube.  Unchanged right upper extremity PICC. Unchanged appearance of enteric tube, terminating out of the field of view. Unchanged position of left IJ approach central catheter, with the tip terminating over the aortic arch. Gas outlines the a pericardial silhouette, new from the comparison. Similar appearance of mixed interstitial and airspace opacities. Similar appearance of extensive myofacial/subcutaneous gas. IMPRESSION: The apices of the lungs have been excluded from the exam, however, small persisting bilateral pneumothorax are favored. Gas outlines the pericardial silhouette, compatible with mediastinal gas, new from prior. Unchanged position of bilateral thoracostomy tubes. Similar appearance of mixed interstitial and airspace disease. Unchanged thoracostomy tube, enteric tube, and left IJ approach central catheter. Note that  the tip of this catheter terminates over the aortic arch. If there were a concern for an arterial location, correlation with blood gas may be useful. Signed, Yvone Neu. Loreta Ave, DO [facility] Electronically Signed   By: Gilmer Mor D.O.   On: 08/19/15 07:33   SIGNIFICANT EVENTS:  07/10/2015 admitted for nausea vomiting, possible drug-induced pancreatitis 07/09/15 went into resp distress: O2 sats 60% on NRBM; transferred to ICU  07/12/15 marked fluid overload unable to wean off bipap 07/16/15 Intubated 3/7 attempted to get off paralytics with significant desaturation and asynchrony 3/11 Right sided PTX>Chest tube  3/13- left PTX, chest tube placed  3/14- less responsove  STUDIES : [name] Abd (2/17) >> Heterogeneous echogenicity of pancreas which could due pancreatic inflammation; small ascites; fatty liver 2D echo (05/2014) >> EF 50%, CHFpEF CT Abd/pelvis - negative for pancreatic necrosis or abscess or complication related to Crohn's disease. New moderate bilateral pleural effusions. New moderate volume of abdominal ascites. 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  Echo 2/26 - normal EF, G1DD  IMAGING :   CULTURES: Blood culture (2/21) >> GNR, Serratia BCx (2/22) >> NGTD  ANTIBIOTICS :  Clofazimine 2/17 >> 2/21 Azithromycin 2/17 >> 2/21 Tigecycline 2/17 -2/20 Vanc  2/21>> 2/22 Meropenem 2/21>>2/24>>>3/7>>>stop date 3/13 Ceftazidine 2/24>> 2/25 Ceftriaxone 2/25 >> 3/7 Acyclovir 3/8 >>>off  LINES:  R PICC --removed PICC 3 lumens 3/6>>> LIJ 2/21>> 2/27 L PICC 2/27 > 3/6 Right ileostomy  ETT 2/28>>>3/10 R IJ TLC 3/6>>>3/8 L IJ TLC 3/8>>> Trach 3/10  R Chest tube 3/11  >> Left chest tube 3/13>>>  DISCUSSION: 65 y/o male with Crohn's disease, long standing protein calorie malnutrition, ostomy who was admitted on 2/21 with acute pancreatitis and has struggled with acute respiratory failure with hypoxemia  likely due to ARDS and pulmonary edema. Intubated 2/28.  ASSESSMENT/PLAN:  PULMONARY A:  Acute hypoxemic respiratory failure secondary: Pulmonary edema, ARDS ?aspiration. ARDS Right PTX  left PTX P: Increased air slight left, increase suction left 40 pcxr in am required Rate 24 SBT attempt reasonable  CARDIOVASCULAR A:  Diastolic dysfunction based on 4098/1191 echo, EF nml. Not known to have CAD HTN  Borderline BP P:  metoprolol PO- dc  RENAL A:  Acute kidney injury Hypernatremia  Hypokalemia  P:  Free water keep for na 145 Keep d5w at current rate bmet q12h   GASTROINTESTINAL A:  Crohn's disease requiring chronic steroids Drug-induced pancreatitis (Tigecycline) - Lipase improved Chronic nausea, vomiting History of antibiotic related diarrhea Severe malnutrition P:  Continue TF Continue stress ulcer prophylaxis.  HEMATOLOGIC A:  Thrombocytopenia, chronic, stable, dilution H/O left lower extremity DVT in October 2016. By history, unprovoked. He was on Xarelto outpatient. 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 Abx for 1 yr. Serratia bacteremia -PICC on admission likely source Fungal rash groin P:  Clotrimazole cream groin Completed course ABX No fever spikes noted  ENDOCRINE A: Chronic adrenal insufficiency  P:  Borderline BP, change pred back to stress roids Monitor sugars SSI as on d5  NEUROLOGIC A: Anxiety, chronic Sedation needs for vent synchrony - off Less responsive, encephalopathy - r./o new event P:  RASS goal: -1 Fen repeat start needed for RR rise Consider comfort care, if  not, would need mri brain / eeg Correct Na done  He is not progressing, consider comfort care  Ccm time 30 min   Mcarthur Rossetti. Tyson Alias, MD, FACP Pgr: [phone]  Pulmonary & Critical Care   Extensive discussion with family and daughter. We discussed the poor prognosis and likely poor quality of life. Family has decided to offer full comfort care. They are aware that the patient may be transferred to palliative care floor for continued comfort care needs. They have been fully updated on the process and expectations.

## 2015-08-17 NOTE — Progress Notes (Signed)
Nutrition Follow-up  DOCUMENTATION CODES:   Non-severe (moderate) malnutrition in context of chronic illness  INTERVENTION:    Continue Vital AF 1.2 at 65 ml/h (1560 ml), add Prostat 30 ml once daily to provide a total of 1972 kcals, 132 gm protein, 1265 ml free water daily.  NUTRITION DIAGNOSIS:   Malnutrition related to chronic illness as evidenced by mild depletion of muscle mass, mild depletion of body fat.  Ongoing  GOAL:   Patient will meet greater than or equal to 90% of their needs  Met  MONITOR:   Vent status, Labs, Weight trends, TF tolerance, Skin  ASSESSMENT:   Mr. 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 In the ED he was noted to have a significant leukocytosis, lactic acidosis, acute kidney injury, and elevated lipase consistent with pancreatitis and was therefore admitted to the internal medicine teaching service for further evaluation and care. He denies a history of alcohol use or hypertriglyceridemia. He had a cholecystectomy in the past and has had no problems since. He has had pancreatitis in the past.  Patient is currently intubated on ventilator support MV: 14 L/min Temp (24hrs), Avg:99.2 F (37.3 C), Min:98.4 F (36.9 C), Max:100 F (37.8 C)   Patient is currently receiving Vital AF 1.2 via post-pyloric Cortrak tube at 65 ml/h (1560 ml/day) to provide 1872 kcals,  117 gm protein, 1265 ml free water daily.   Diet Order:  Diet NPO time specified  Skin:  Reviewed, no issues  Last BM:  3/15 (ileostomy-1900 ml output 3/14)  Height:   Ht Readings from Last 1 Encounters:  05/28/15 5' 10"  (1.778 m)    Weight:   Wt Readings from Last 1 Encounters:  08-01-15 141 lb 12.1 oz (64.3 kg)    Ideal Body Weight:  75.5 kg  BMI:  Body mass index is 20.34 kg/(m^2).  Estimated Nutritional Needs:   Kcal:  1930  Protein:  >/= 125 gm  Fluid:  >/= 2 L  EDUCATION NEEDS:   Education needs addressed  Molli Barrows, [place], LDN, [place] Pager [phone] After Hours Pager [phone]

## 2015-08-17 NOTE — Consult Note (Signed)
WOC wound follow up Wound type: sacral pressure injury (new) Consulted yesterday for same, discussed with bedside nurse.  Patient declining with unequal pupils and did not withdraw to pain. Decided to hold off on my assessment pending further discussion per CCM and family on goals of care.   Contacted bedside nurse later in day 1430, patient to CT for head scan to determine any acute changes.   This am discussed with bedside nurse. Patient's heart rate and breathing continue to decline.   Family at bedside, wife aware of new area on the patient's sacrum Wound bed: bedside nurse describes as dark purple area in the gluteal cleft/coccyx area, with some partial thickness skin loss.  Bedside nurse if fearful to turn patient right now, with his current cardiac and respiratory status I will check in again with beside nurse later today, CCM to discuss patient status with family this am.  WOC will follow along as needed.  Ryszard Socarras [place], [place] [phone]

## 2015-08-17 NOTE — Procedures (Signed)
Extubation Procedure Note  Patient Details:   Name: Lee Klein DOB: Jul 24, 1950 MRN: [id number]   Airway Documentation:     Evaluation  O2 sats: stable throughout Complications: No apparent complications Patient did tolerate procedure well. Bilateral Breath Sounds: Clear Suctioning: Oral, Airway No   Pt extubated per Withdrawal of Life Protocol to Room Air per Dr Tyson Alias. No Complications. RT will continue to monitor.   Carolan Shiver 08/22/15, 6:51 PM

## 2015-08-17 NOTE — Progress Notes (Signed)
Family decision for full comfort care. Fentanyl drip stopped, 200 ml wasted in the sink (Jeniffer Bishop, RN witnessed) Morphine and Ativan drip started as ordered (See MAR). Few boluses given for agonal rapid breaths. 1852 Asystole on monitor. 2 RNs assessed, no ascultation of breath sounds or heart sounds heard. Emotional support was given to family. [facility] called.

## 2015-08-17 NOTE — Progress Notes (Signed)
HR in 40s. MD notified and aware

## 2015-08-17 DEATH — deceased

## 2015-09-16 NOTE — Discharge Summary (Signed)
Lee Klein, [name] NO.:  [id number]  MEDICAL RECORD NO.:  [id number]  LOCATION:  2M04C                        FACILITY:  [facility]  PHYSICIAN:  Nelda Bucks, MD DATE OF BIRTH:  [birth year]  DATE OF ADMISSION:  06/23/2015 DATE OF DISCHARGE:  August 20, 2015                              DISCHARGE SUMMARY   DEATH SUMMARY  This is an unfortunate 65 year old man who was admitted with acute on chronic nausea and vomiting, history of Crohn's disease, with also significant comorbidities, had chronic left hand osteomyelitis for which he was on chronic antibiotics.  He was treated as he had a drug-induced pancreatitis as well.  He presented, had a lactic acidosis, was fluid resuscitated, respiratory distress on February 21, x-rays revealing significant bilateral infiltrates consistent with pneumonia, requiring intubation, after failing 100% non-rebreather and Lasix on February 28. The patient had a very prolonged hospitalization course, on February 17 being admitted with nausea and vomiting; possible drug-induced pancreatitis.  Respiratory distress on 21st marked fluid overload; treated with BiPAP on the 24th and intubated on the 28th.  The patient had severe ARDS requiring paralysis on March 7, worsening bilateral infiltrates on March 11, had a right-sided pneumothorax treated with a chest tube on March 13 and a left-sided pneumothorax spontaneously treated with a chest tube, and he had progressive worsening neurologic status.  He had an ultrasound on February 17 of his abdomen showing some pancreatic inflammation but no ascites and a fatty liver. Echocardiogram back in January showed ejection fraction of 50% with possible diastolic CHF.  The patient had a CT scan of the abdomen and pelvis negative for pancreatic necrosis or abscess or complicating feature related to Crohn's, but did have new moderate bilateral pleural effusions, new moderate volume abdominal ascites,  and a pseudocyst of the pancreas.  Echocardiogram on February 26 showed normal ejection fraction and grade 1 diastolic dysfunction.  The patient had Serratia in the blood on February 21, was treated initially with clofazimine, azithromycin, and tigecycline.  He was treated with vancomycin on February 21, meropenem on February 21, multiple courses of meropenem to ceftazidime, ceftriaxone, and acyclovir as well.  Required tracheostomy on March 10.  Again, there was a chest tube placement on March 11 and March 13.  This patient with his worsening neurologic status and cephalopathy in the setting of a multiorgan failure and lack of progress and worsening respiratory status, was likely not going to have a good quality of life outcome whatsoever, if this was even survival.  His wife who was his nurse, opted for comfort care.  Comfort care was provided which was very appropriate, and the patient expired.  FINAL DIAGNOSES UPON DEATH: 1. Pneumonia. 2. Severe acute respiratory distress syndrome. 3. Bilateral spontaneous pneumothoraces secondary to #2. 4. Status post tracheostomy secondary to above. 5. Diastolic heart failure. 6. Acute kidney injury. 7. Crohn's disease requiring chronic steroids. 8. Drug-induced pancreatitis secondary to tigecycline. 9. Severe malnutrition. 10.Thrombocytopenia. 11.History of deep venous thrombosis. 12.Coagulopathy. 13.Serratia bacteremia likely secondary to PICC line on admission.     Nelda Bucks, MD     DJF/MEDQ  D:  08/19/2015  T:  08/19/2015  Job:  [id number]

## 2016-01-04 IMAGING — CR DG CHEST 1V PORT
1 series · 1 of 1 positions shown · non-contrast
Comparison: 05/09/2015

CLINICAL DATA: Hypoxia

EXAM:
PORTABLE CHEST 1 VIEW

[AP]
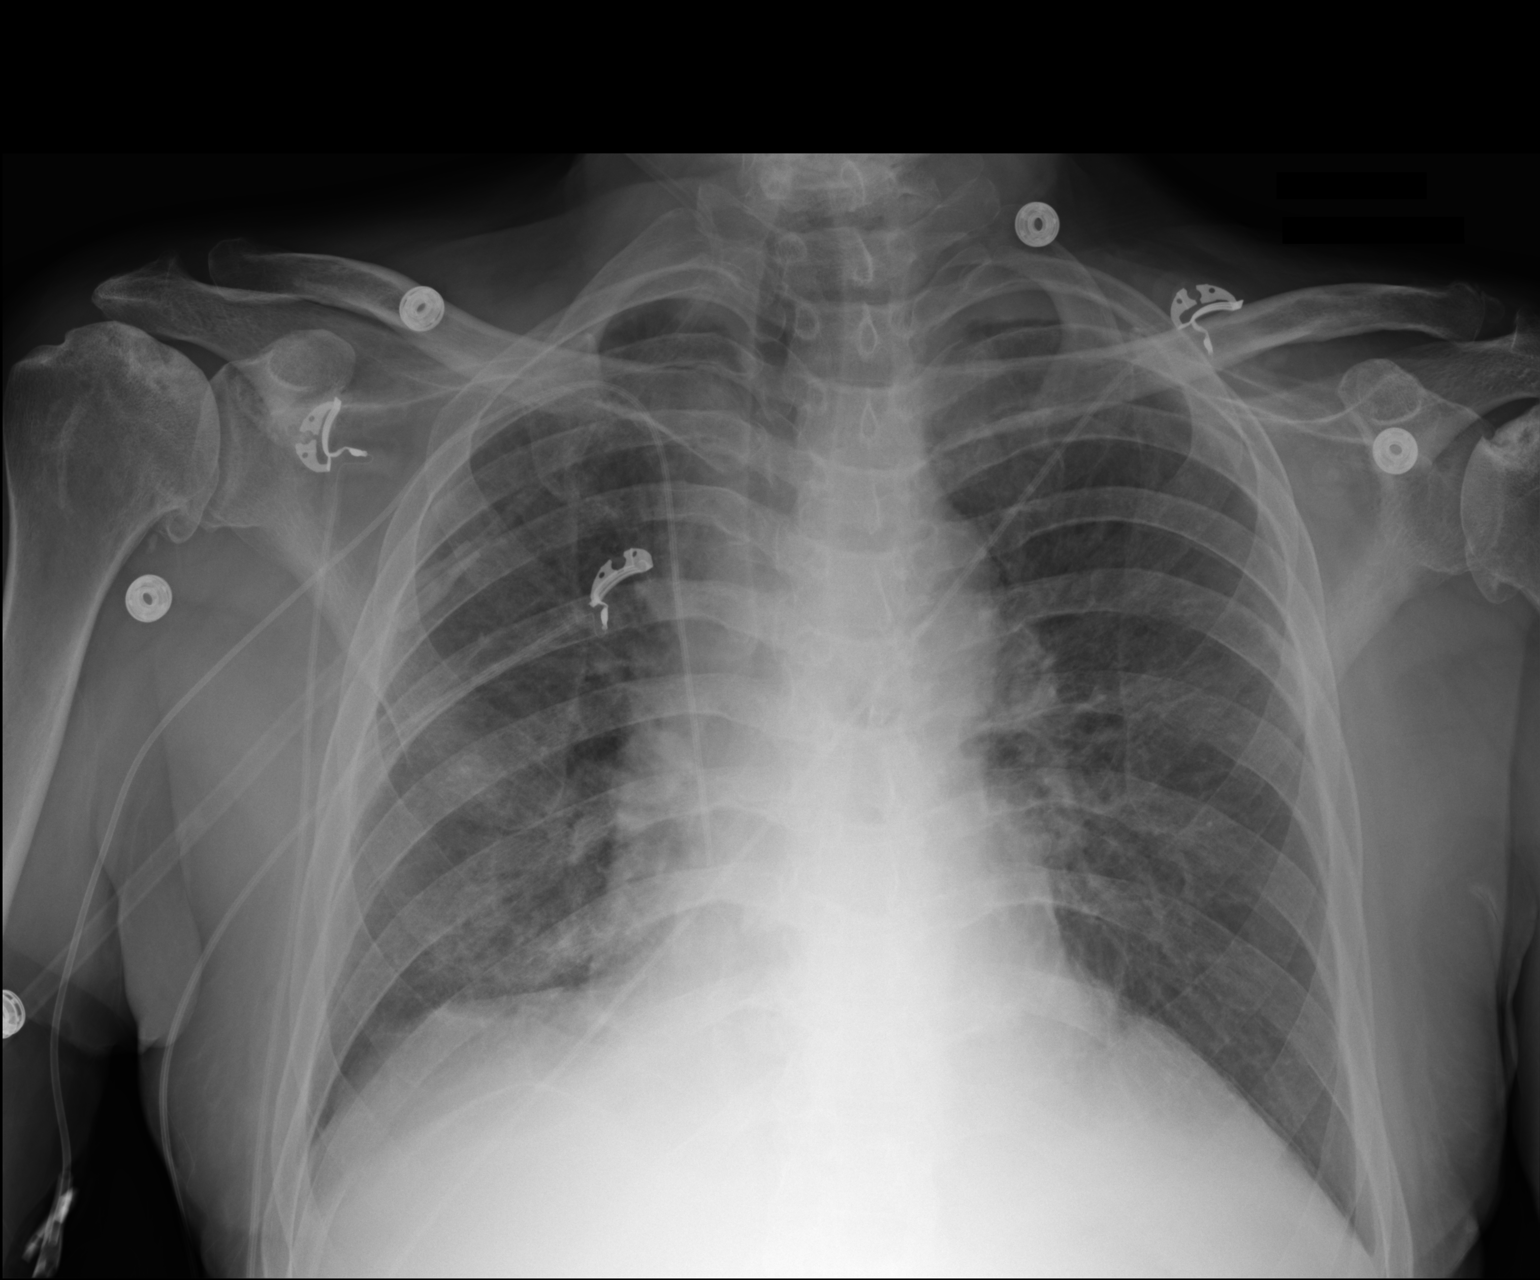

[1 of 1 positions shown; findings below may reference images not displayed]

FINDINGS: Cardiomediastinal silhouette is stable. Right arm PICC line is
unchanged in position. Improvement in aeration. Residual mild right
infrahilar interstitial infiltrate. No new infiltrate or pulmonary
edema.
IMPRESSION: Improvement in aeration. Small residual interstitial infiltrate
right infrahilar. No new infiltrate or pulmonary edema. Stable right
arm PICC line position.

## 2016-03-01 IMAGING — CR DG CHEST 1V PORT
1 series · 1 of 1 positions shown · non-contrast
Comparison: Earlier the same day

CLINICAL DATA: Central line placement in patient with possible
ARDS.

EXAM:
PORTABLE CHEST 1 VIEW

[AP]
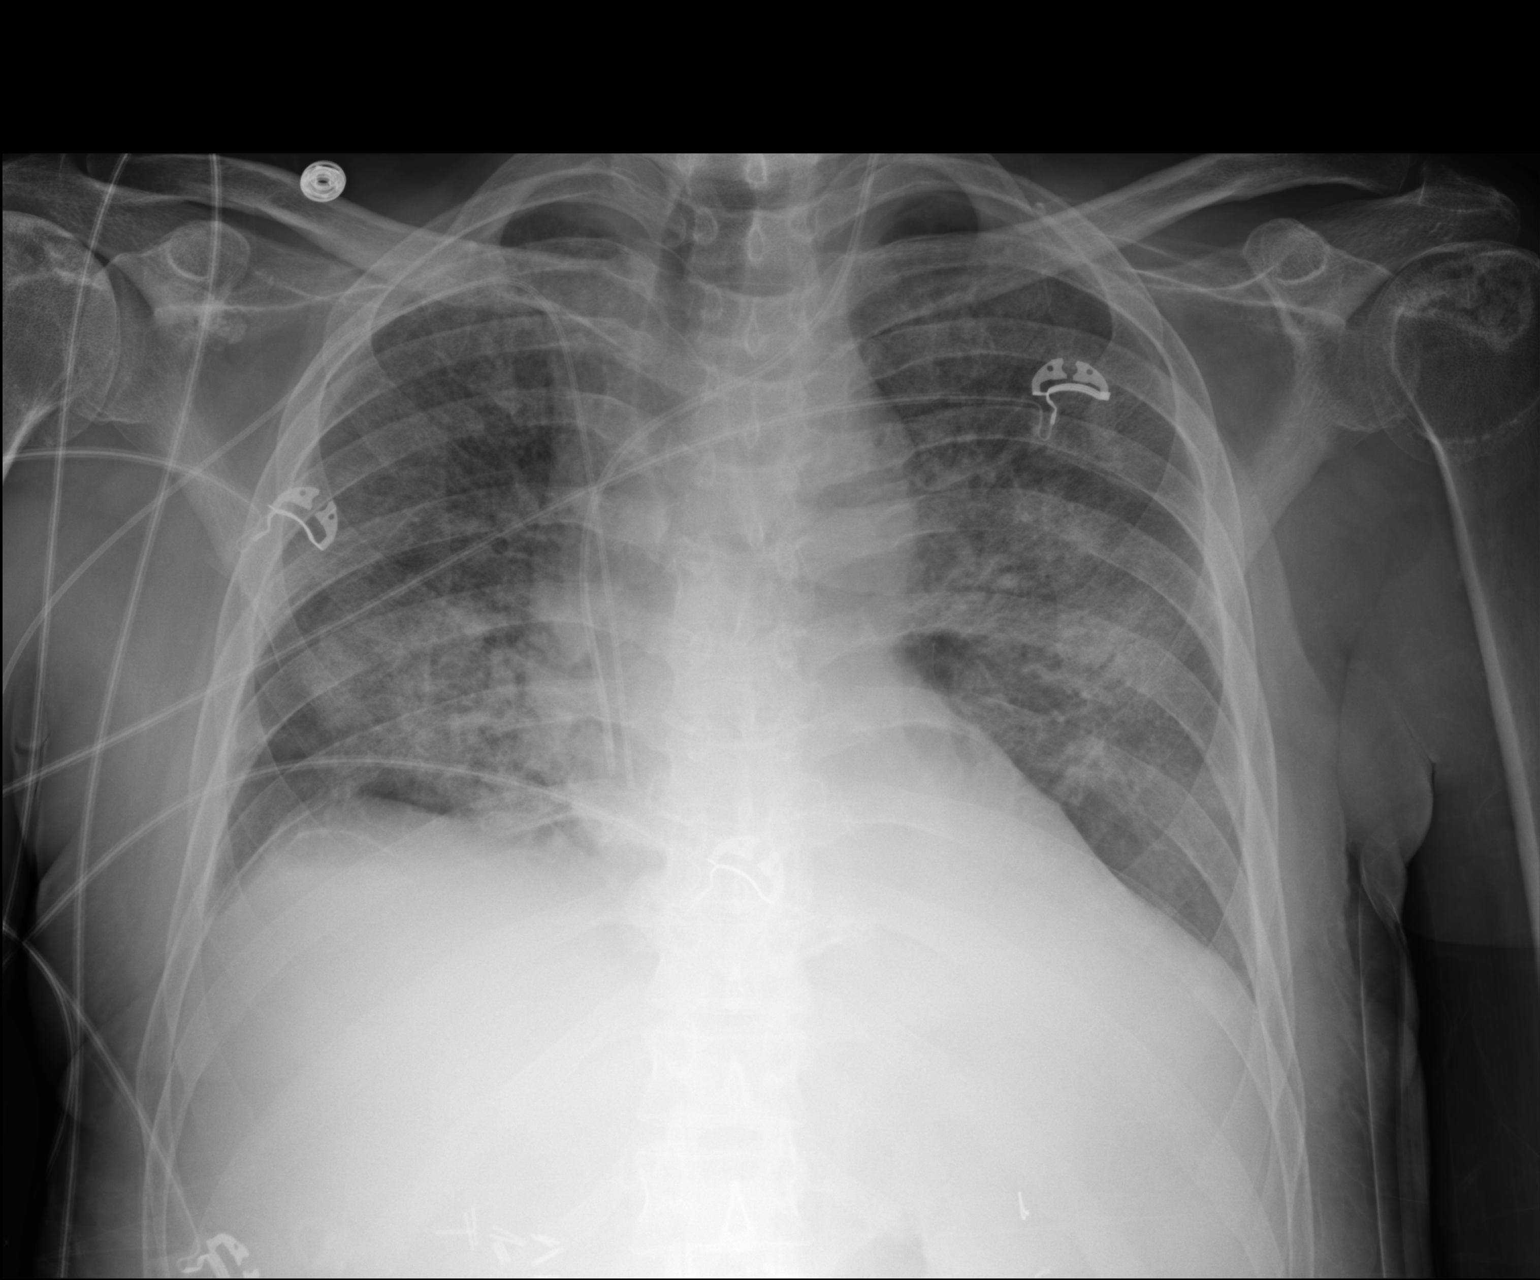

[1 of 1 positions shown; findings below may reference images not displayed]

FINDINGS: 8857 hours. Left IJ central venous catheter is new in the interval
with catheter tip overlying the SVC/RA junction, possibly just into
the upper right atrium. No evidence for left pneumothorax. Right
PICC line remains in place with tip also near the SVC/ RA junction,
possibly just into the upper right atrium. The bilateral diffuse
airspace disease has decreased in the interval, especially in the
upper lungs. Retrocardiac collapse/ consolidation with potential
left pleural effusion as before. Telemetry leads overlie the chest.
IMPRESSION: New left IJ central line tip overlies the upper right atrium without
evidence for pneumothorax.

Slight interval decrease in bilateral airspace disease.

## 2016-03-23 IMAGING — CR DG CHEST 1V PORT
1 series · 1 of 1 positions shown · non-contrast
Comparison: Multiple prior comparison, most recent 07/30/2015.

CLINICAL DATA: 64-year-old male with a history of pneumothorax.

EXAM:
PORTABLE CHEST 1 VIEW

[AP]
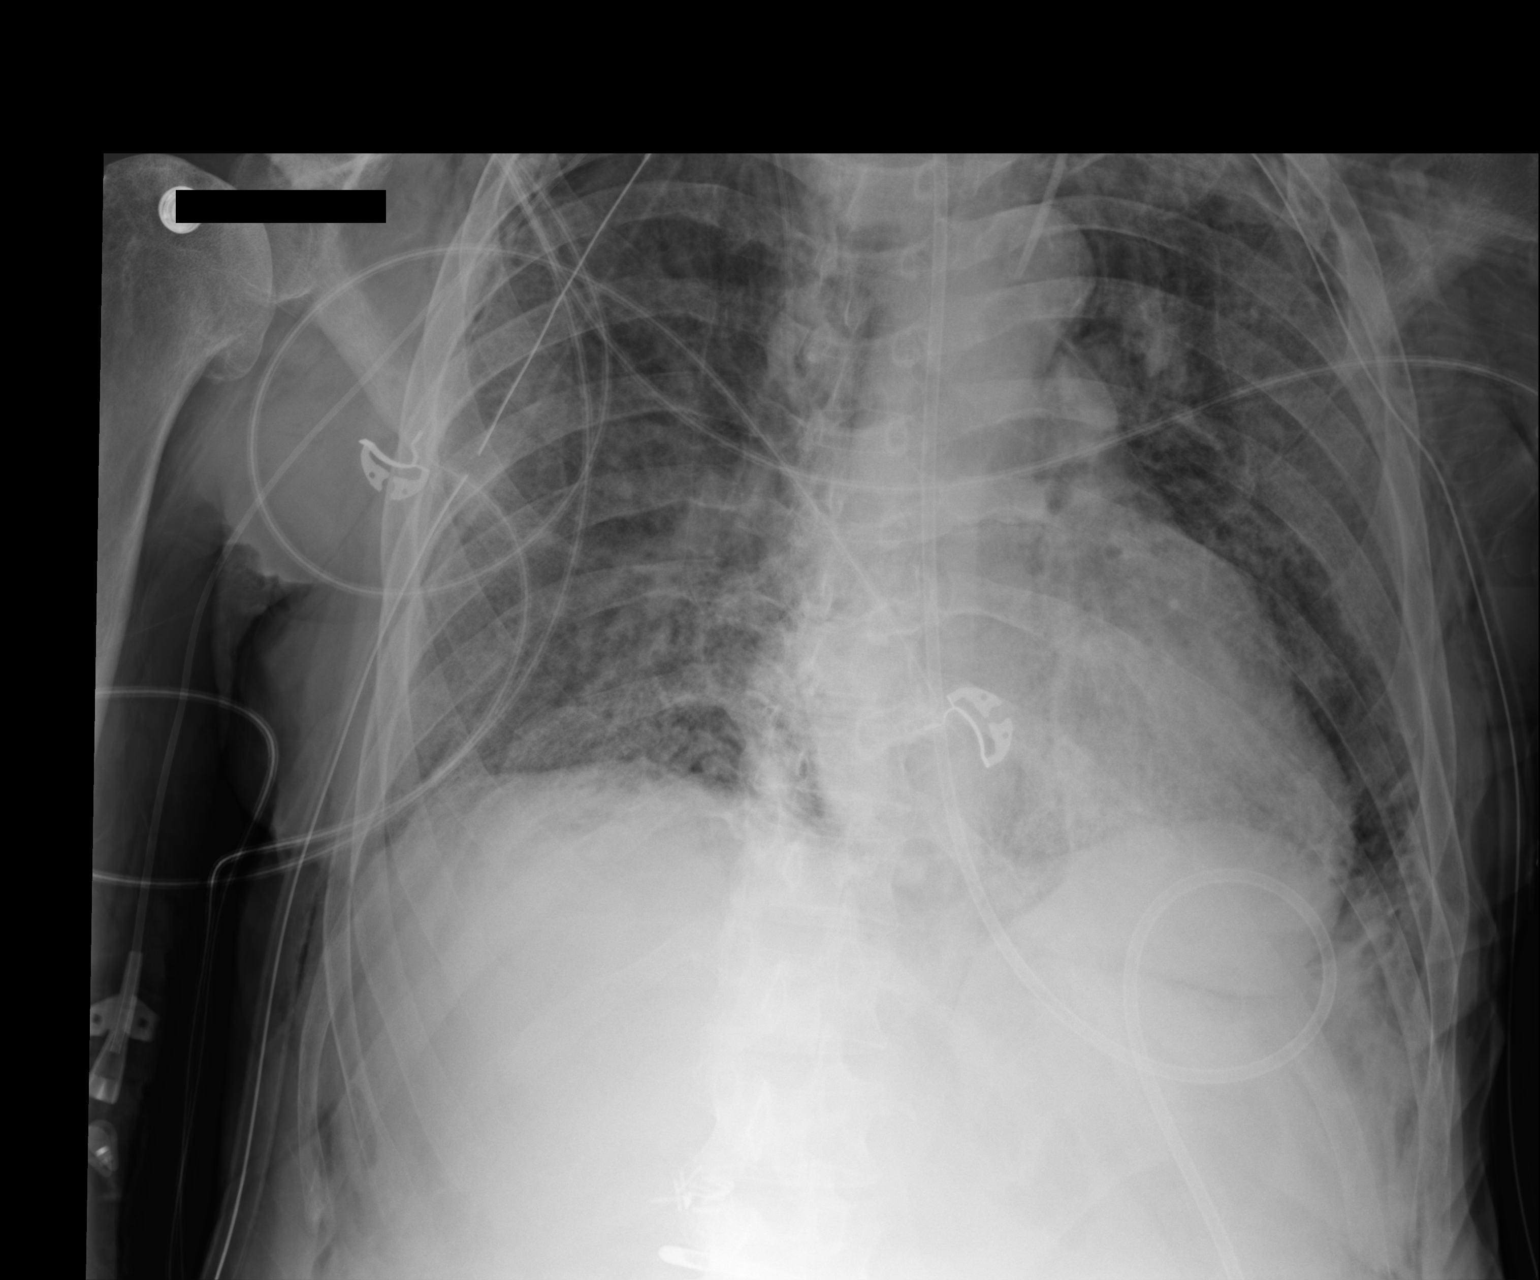

[1 of 1 positions shown; findings below may reference images not displayed]

FINDINGS: Note that the apices of the chest have been excluded from the exam,
somewhat limiting evaluation. Persistence of the bilateral small
apical pneumothorax favored.

Unchanged position of large bore thoracostomy tube on the right in
the left, each terminating at the apex.

Unchanged tracheostomy tube.  Unchanged right upper extremity PICC.

Unchanged appearance of enteric tube, terminating out of the field
of view.

Unchanged position of left IJ approach central catheter, with the
tip terminating over the aortic arch.

Gas outlines the a pericardial silhouette, new from the comparison.

Similar appearance of mixed interstitial and airspace opacities.

Similar appearance of extensive myofacial/subcutaneous gas.
IMPRESSION: The apices of the lungs have been excluded from the exam, however,
small persisting bilateral pneumothorax are favored.

Gas outlines the pericardial silhouette, compatible with mediastinal
gas, new from prior.

Unchanged position of bilateral thoracostomy tubes.

Similar appearance of mixed interstitial and airspace disease.

Unchanged thoracostomy tube, enteric tube, and left IJ approach
central catheter. Note that the tip of this catheter terminates over
the aortic arch. If there were a concern for an arterial location,
correlation with blood gas may be useful.

## 2017-04-27 IMAGING — US US ABDOMEN COMPLETE
1 series · 14 of 25 positions shown · non-contrast
Comparison: CT, 03/27/2016.

CLINICAL DATA: Elevated liver function tests. Pancreatitis. History
of a cholecystectomy.

EXAM:
ABDOMEN ULTRASOUND COMPLETE

[Series 1: us abdomen complete · 0.26mm/px · 14 of 52 slices shown]
[im 1/52]
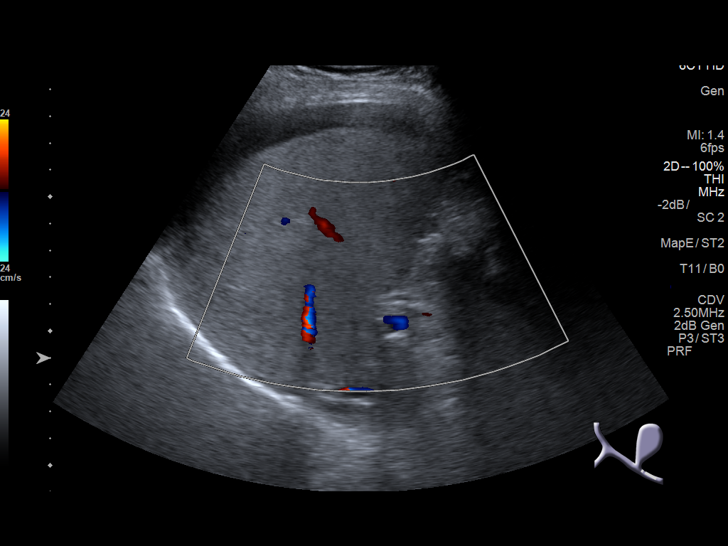
[im 5/52]
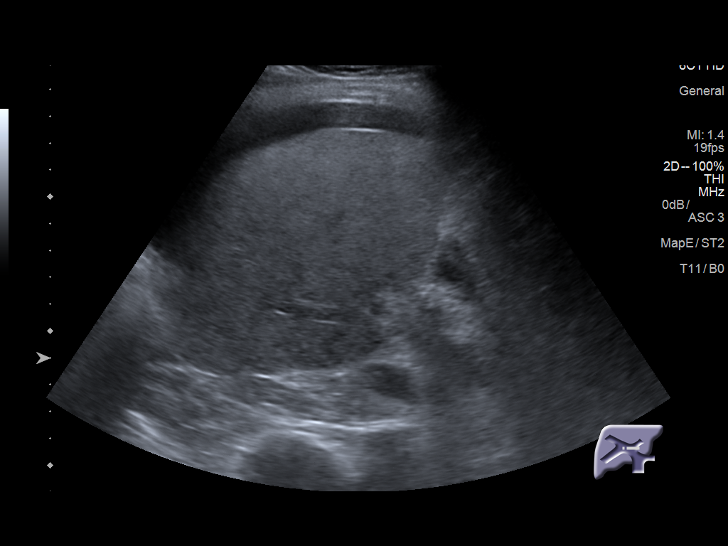
[im 9/52]
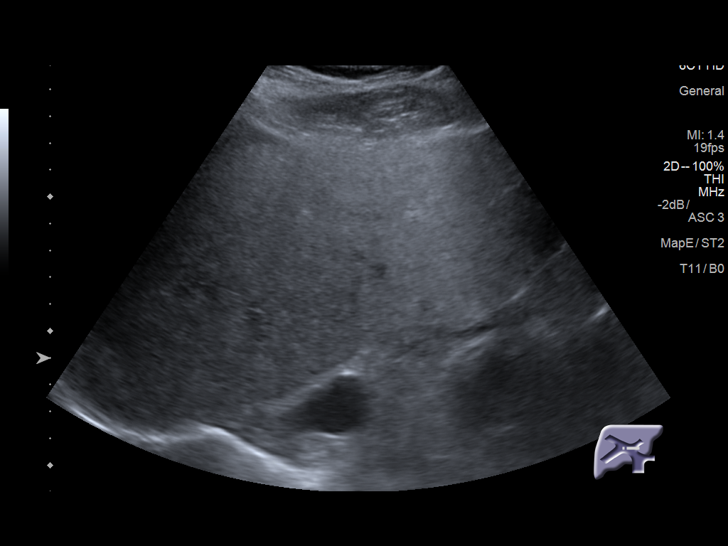
[im 13/52]
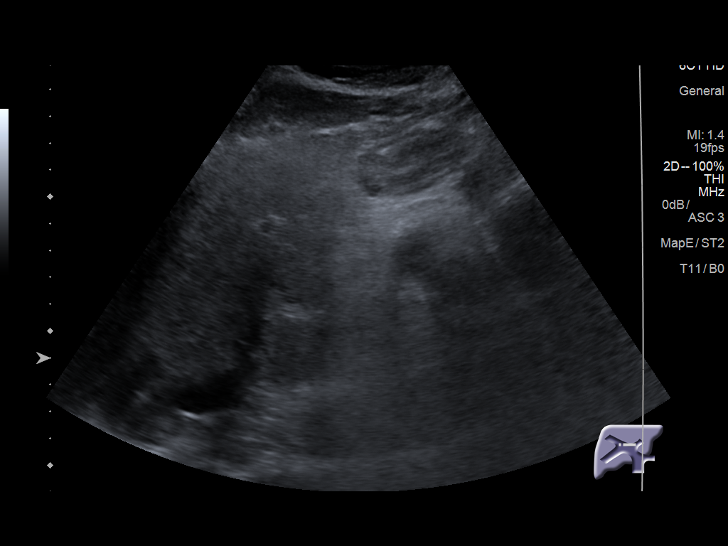
[im 18/52]
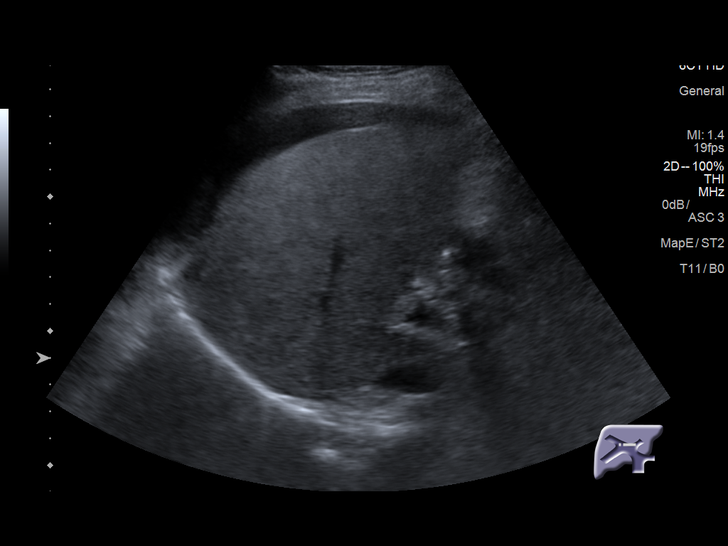
[im 20/52]
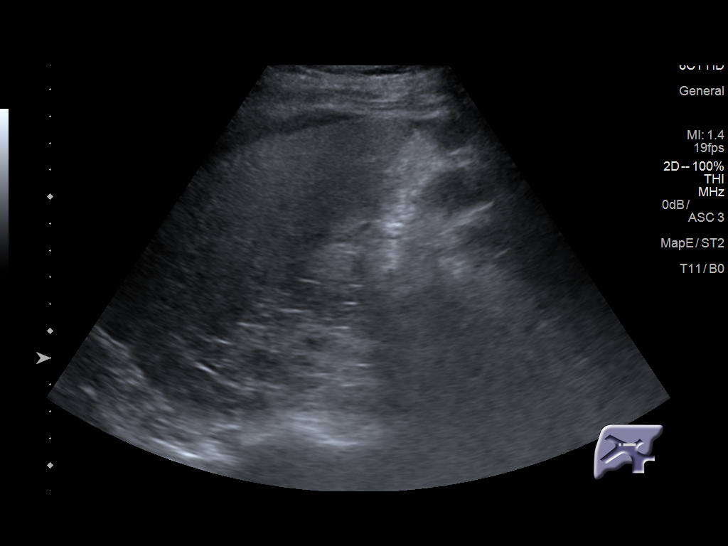
[im 24/52]
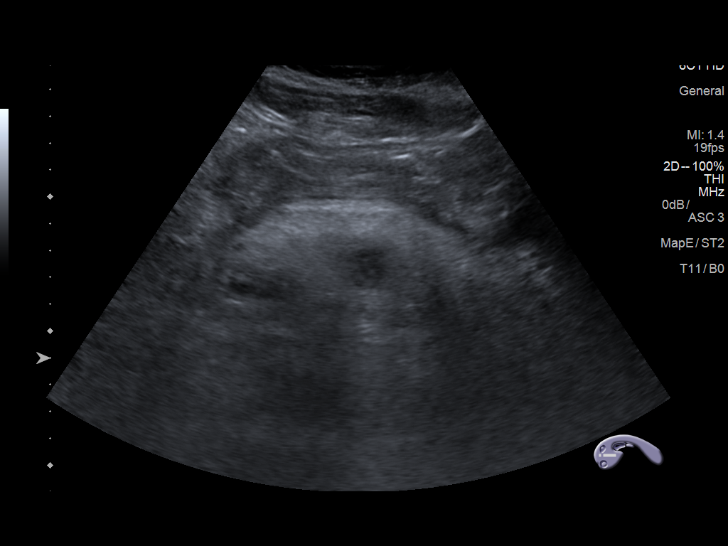
[im 28/52]
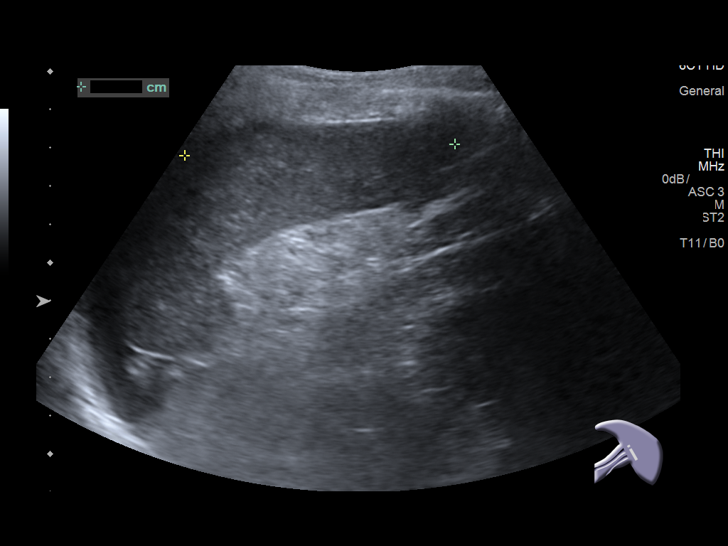
[im 32/52]
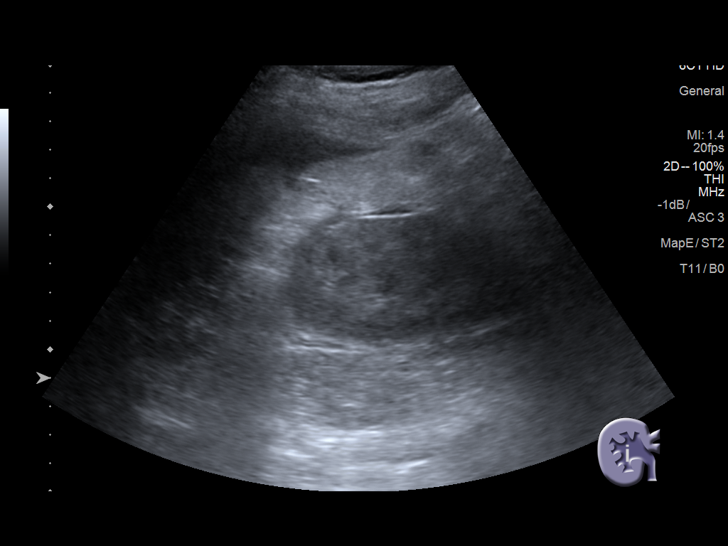
[im 35/52]
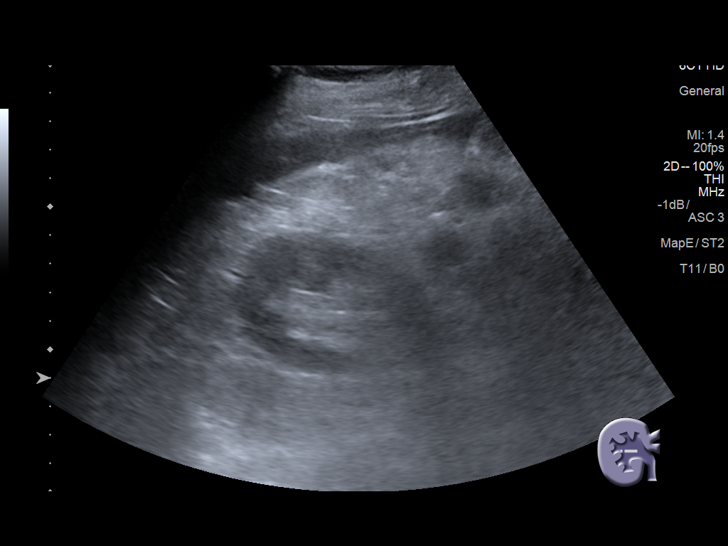
[im 39/52]
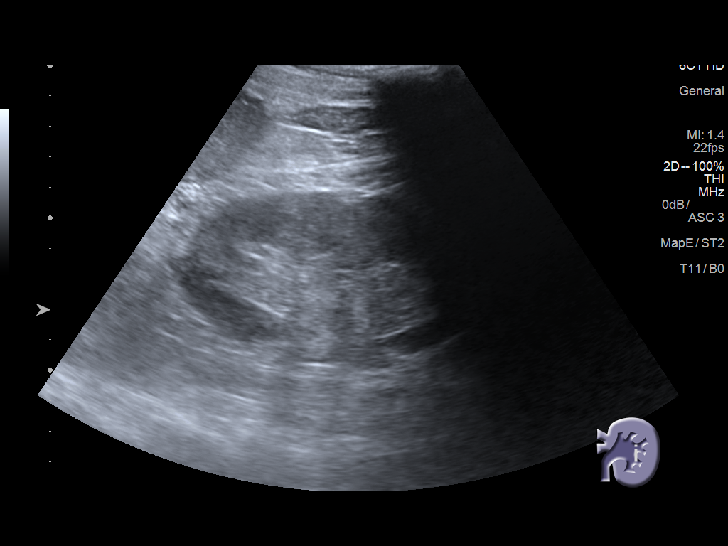
[im 43/52]
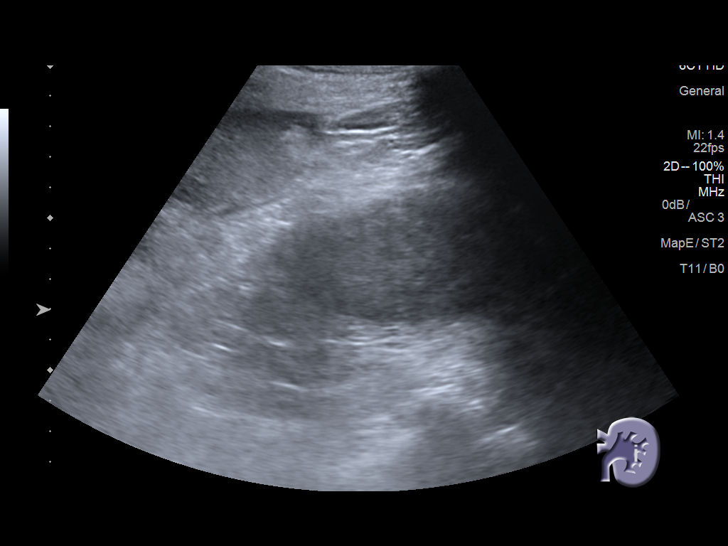
[im 47/52]
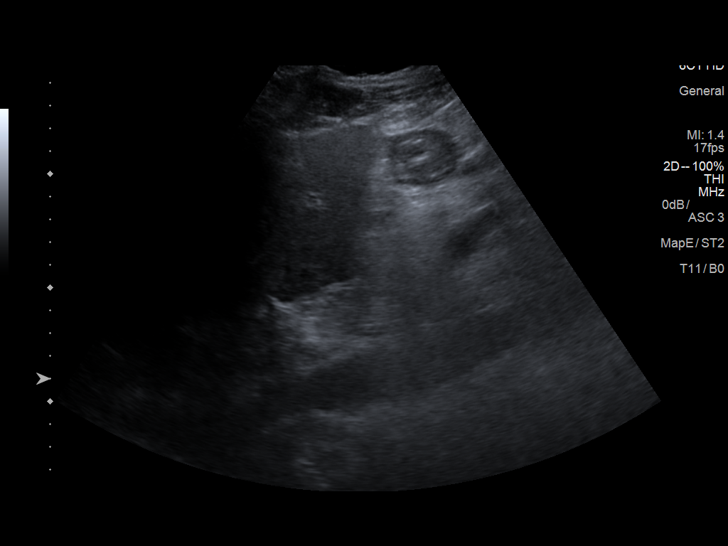
[im 52/52]
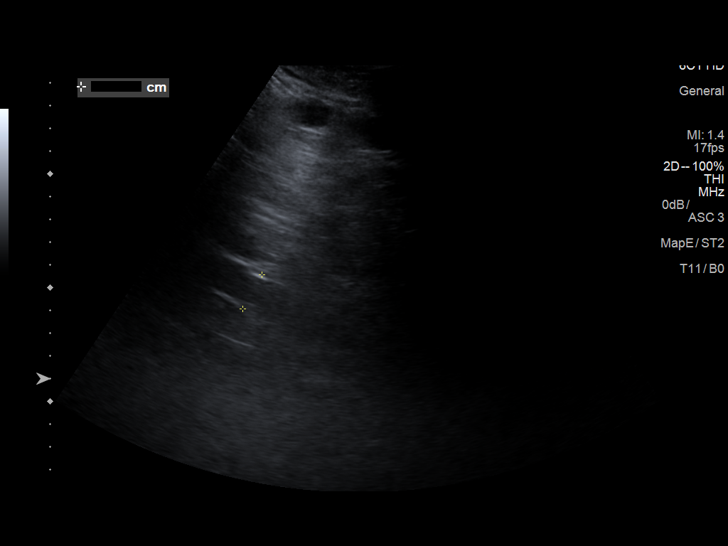

[14 of 25 positions shown; findings below may reference images not displayed]

FINDINGS: Gallbladder: Surgically absent

Common bile duct: Diameter: 3.2 mm

Liver: Increased echogenicity.  No mass or focal lesion.

IVC: No abnormality visualized.

Pancreas: Heterogeneous in echogenicity. Inferior head and distal
tail obscured by bowel gas.

Spleen: Size and appearance within normal limits.

Right Kidney: Length: 1.3 cm. Echogenicity within normal limits. No
mass or hydronephrosis visualized.

Left Kidney: Length: 10.2 cm. Echogenicity within normal limits. No
mass or hydronephrosis visualized.

Abdominal aorta: No aneurysm visualized.

Other findings: A small amount of ascites primarily seen adjacent to
the liver.
IMPRESSION: 1. Heterogeneous echogenicity of pancreas which could due pancreatic
inflammation.
2. Small amount of ascites, which was not present on the prior CT.
3. Echogenic liver consistent with hepatic steatosis.
4. Status post cholecystectomy.  No bile duct dilation.
